# Patient Record
Sex: Male | Born: 1970 | Race: White | Hispanic: No | Marital: Single | Smoking: Current every day smoker
Health system: Southern US, Community
[De-identification: ages and names within clinical notes are randomized; demographics above are authoritative.]

## PROBLEM LIST (undated history)

## (undated) ENCOUNTER — Emergency Department: Admission: EM | Disposition: A | Discharge status: Home / Self Care | Payer: Medicaid Other | Source: Home / Self Care

## (undated) DIAGNOSIS — F419 Anxiety disorder, unspecified: Secondary | ICD-10-CM

## (undated) DIAGNOSIS — K922 Gastrointestinal hemorrhage, unspecified: Secondary | ICD-10-CM

## (undated) DIAGNOSIS — M1990 Unspecified osteoarthritis, unspecified site: Secondary | ICD-10-CM

## (undated) DIAGNOSIS — Z87891 Personal history of nicotine dependence: Secondary | ICD-10-CM

## (undated) DIAGNOSIS — I2089 Other forms of angina pectoris: Secondary | ICD-10-CM

## (undated) DIAGNOSIS — F329 Major depressive disorder, single episode, unspecified: Secondary | ICD-10-CM

## (undated) DIAGNOSIS — J449 Chronic obstructive pulmonary disease, unspecified: Secondary | ICD-10-CM

## (undated) DIAGNOSIS — K219 Gastro-esophageal reflux disease without esophagitis: Secondary | ICD-10-CM

## (undated) DIAGNOSIS — F32A Depression, unspecified: Secondary | ICD-10-CM

## (undated) DIAGNOSIS — I422 Other hypertrophic cardiomyopathy: Secondary | ICD-10-CM

## (undated) DIAGNOSIS — R011 Cardiac murmur, unspecified: Secondary | ICD-10-CM

## (undated) DIAGNOSIS — I10 Essential (primary) hypertension: Secondary | ICD-10-CM

## (undated) DIAGNOSIS — I208 Other forms of angina pectoris: Secondary | ICD-10-CM

## (undated) DIAGNOSIS — F319 Bipolar disorder, unspecified: Secondary | ICD-10-CM

## (undated) DIAGNOSIS — G629 Polyneuropathy, unspecified: Secondary | ICD-10-CM

## (undated) DIAGNOSIS — F909 Attention-deficit hyperactivity disorder, unspecified type: Secondary | ICD-10-CM

## (undated) DIAGNOSIS — F4310 Post-traumatic stress disorder, unspecified: Secondary | ICD-10-CM

## (undated) DIAGNOSIS — J45909 Unspecified asthma, uncomplicated: Secondary | ICD-10-CM

## (undated) HISTORY — DX: Attention-deficit hyperactivity disorder, unspecified type: F90.9

## (undated) HISTORY — DX: Polyneuropathy, unspecified: G62.9

## (undated) HISTORY — DX: Essential (primary) hypertension: I10

## (undated) HISTORY — DX: Gastro-esophageal reflux disease without esophagitis: K21.9

## (undated) HISTORY — DX: Gastrointestinal hemorrhage, unspecified: K92.2

## (undated) HISTORY — PX: CORONARY ARTERY BYPASS GRAFT: SHX141

## (undated) HISTORY — DX: Cardiac murmur, unspecified: R01.1

## (undated) HISTORY — DX: Other forms of angina pectoris: I20.89

## (undated) HISTORY — DX: Other forms of angina pectoris: I20.8

## (undated) HISTORY — DX: Major depressive disorder, single episode, unspecified: F32.9

## (undated) HISTORY — DX: Post-traumatic stress disorder, unspecified: F43.10

## (undated) HISTORY — DX: Anxiety disorder, unspecified: F41.9

## (undated) HISTORY — DX: Personal history of nicotine dependence: Z87.891

## (undated) HISTORY — DX: Chronic obstructive pulmonary disease, unspecified: J44.9

## (undated) HISTORY — DX: Unspecified asthma, uncomplicated: J45.909

## (undated) HISTORY — DX: Bipolar disorder, unspecified: F31.9

## (undated) HISTORY — PX: CARDIAC CATHETERIZATION: SHX172

## (undated) HISTORY — DX: Depression, unspecified: F32.A

## (undated) HISTORY — DX: Other hypertrophic cardiomyopathy: I42.2

## (undated) HISTORY — DX: Unspecified osteoarthritis, unspecified site: M19.90

## (undated) HISTORY — PX: COLON SURGERY: SHX602

---

## 2005-08-31 ENCOUNTER — Encounter: Admission: RE | Admit: 2005-08-31 | Discharge: 2005-08-31 | Payer: Self-pay | Admitting: Neurosurgery

## 2008-01-06 ENCOUNTER — Emergency Department (HOSPITAL_COMMUNITY): Admission: EM | Admit: 2008-01-06 | Discharge: 2008-01-06 | Payer: Self-pay | Admitting: Emergency Medicine

## 2008-01-23 ENCOUNTER — Emergency Department (HOSPITAL_BASED_OUTPATIENT_CLINIC_OR_DEPARTMENT_OTHER): Admission: EM | Admit: 2008-01-23 | Discharge: 2008-01-23 | Payer: Self-pay | Admitting: Emergency Medicine

## 2008-02-03 ENCOUNTER — Emergency Department (HOSPITAL_BASED_OUTPATIENT_CLINIC_OR_DEPARTMENT_OTHER): Admission: EM | Admit: 2008-02-03 | Discharge: 2008-02-03 | Payer: Self-pay | Admitting: Emergency Medicine

## 2008-02-09 ENCOUNTER — Emergency Department (HOSPITAL_BASED_OUTPATIENT_CLINIC_OR_DEPARTMENT_OTHER): Admission: EM | Admit: 2008-02-09 | Discharge: 2008-02-09 | Payer: Self-pay | Admitting: Emergency Medicine

## 2008-05-07 ENCOUNTER — Emergency Department (HOSPITAL_BASED_OUTPATIENT_CLINIC_OR_DEPARTMENT_OTHER): Admission: EM | Admit: 2008-05-07 | Discharge: 2008-05-07 | Payer: Self-pay | Admitting: Emergency Medicine

## 2008-06-30 ENCOUNTER — Emergency Department (HOSPITAL_COMMUNITY): Admission: EM | Admit: 2008-06-30 | Discharge: 2008-06-30 | Payer: Self-pay | Admitting: Emergency Medicine

## 2009-10-04 ENCOUNTER — Emergency Department (HOSPITAL_COMMUNITY): Admission: EM | Admit: 2009-10-04 | Discharge: 2009-10-05 | Payer: Self-pay | Admitting: Emergency Medicine

## 2009-10-11 ENCOUNTER — Emergency Department (HOSPITAL_BASED_OUTPATIENT_CLINIC_OR_DEPARTMENT_OTHER): Admission: EM | Admit: 2009-10-11 | Discharge: 2009-10-11 | Payer: Self-pay | Admitting: Emergency Medicine

## 2010-09-02 LAB — DIFFERENTIAL
Basophils Relative: 1 % (ref 0–1)
Eosinophils Absolute: 0.1 10*3/uL (ref 0.0–0.7)
Eosinophils Relative: 1 % (ref 0–5)
Lymphocytes Relative: 26 % (ref 12–46)
Lymphs Abs: 2.6 10*3/uL (ref 0.7–4.0)
Monocytes Absolute: 0.5 10*3/uL (ref 0.1–1.0)
Monocytes Relative: 5 % (ref 3–12)

## 2010-09-02 LAB — CBC
HCT: 46.3 % (ref 39.0–52.0)
Hemoglobin: 15.9 g/dL (ref 13.0–17.0)
MCV: 83.5 fL (ref 78.0–100.0)
RBC: 5.55 MIL/uL (ref 4.22–5.81)

## 2010-09-02 LAB — BASIC METABOLIC PANEL
BUN: 18 mg/dL (ref 6–23)
CO2: 24 mEq/L (ref 19–32)
Calcium: 8.8 mg/dL (ref 8.4–10.5)
Creatinine, Ser: 1 mg/dL (ref 0.4–1.5)
GFR calc Af Amer: >60 mL/min (ref >60)
Potassium: 4.8 mEq/L (ref 3.5–5.1)

## 2010-09-29 LAB — POCT CARDIAC MARKERS
CKMB, poc: <1 ng/mL — ABNORMAL LOW (ref 1.0–8.0)
Troponin i, poc: <0.05 ng/mL (ref 0.00–0.09)

## 2010-09-29 LAB — DIFFERENTIAL
Eosinophils Absolute: 0.3 10*3/uL (ref 0.0–0.7)
Eosinophils Relative: 4 % (ref 0–5)
Lymphs Abs: 2.6 10*3/uL (ref 0.7–4.0)
Monocytes Absolute: 0.5 10*3/uL (ref 0.1–1.0)
Monocytes Relative: 7 % (ref 3–12)
Neutro Abs: 4.2 10*3/uL (ref 1.7–7.7)
Neutrophils Relative %: 54 % (ref 43–77)

## 2010-09-29 LAB — POCT I-STAT, CHEM 8
Calcium, Ion: 1.11 mmol/L — ABNORMAL LOW (ref 1.12–1.32)
Glucose, Bld: 99 mg/dL (ref 70–99)
HCT: 52 % (ref 39.0–52.0)
TCO2: 20 mmol/L (ref 0–100)

## 2010-09-29 LAB — BASIC METABOLIC PANEL
BUN: 7 mg/dL (ref 6–23)
Calcium: 9.8 mg/dL (ref 8.4–10.5)
Creatinine, Ser: 1.12 mg/dL (ref 0.4–1.5)
Glucose, Bld: 104 mg/dL — ABNORMAL HIGH (ref 70–99)

## 2010-09-29 LAB — CBC
MCHC: 34.3 g/dL (ref 30.0–36.0)
Platelets: 223 10*3/uL (ref 150–400)

## 2010-10-28 ENCOUNTER — Observation Stay: Payer: Self-pay | Admitting: Internal Medicine

## 2010-11-13 ENCOUNTER — Observation Stay: Payer: Self-pay | Admitting: Internal Medicine

## 2011-01-09 ENCOUNTER — Emergency Department: Payer: Self-pay | Admitting: Emergency Medicine

## 2011-01-10 ENCOUNTER — Emergency Department: Payer: Self-pay | Admitting: *Deleted

## 2011-01-25 ENCOUNTER — Inpatient Hospital Stay: Payer: Self-pay | Admitting: Internal Medicine

## 2011-01-29 ENCOUNTER — Ambulatory Visit (INDEPENDENT_AMBULATORY_CARE_PROVIDER_SITE_OTHER): Payer: Medicaid Other | Admitting: Cardiovascular Disease

## 2011-01-29 ENCOUNTER — Encounter: Payer: Self-pay | Admitting: Cardiovascular Disease

## 2011-01-29 DIAGNOSIS — I421 Obstructive hypertrophic cardiomyopathy: Secondary | ICD-10-CM | POA: Insufficient documentation

## 2011-01-29 DIAGNOSIS — R609 Edema, unspecified: Secondary | ICD-10-CM | POA: Insufficient documentation

## 2011-01-29 DIAGNOSIS — G4733 Obstructive sleep apnea (adult) (pediatric): Secondary | ICD-10-CM

## 2011-01-29 NOTE — Assessment & Plan Note (Signed)
History of myomectomy in 2008. Echo suggesting moderate LVH in May of this year. I will review the echo myself. He is concerned about previous physicians suggesting he had an ICD. He refused any pacemaker or ICD in the past. He has been feeling well. Occasional dizziness typically with bending over and standing up. We have stressed he try to restart his metoprolol b.i.d.. This was previously stopped while in the hospital for bradycardia while sleeping.

## 2011-01-29 NOTE — Assessment & Plan Note (Signed)
He does not wear CPAP. He has been unable to tolerate this. Otherwise sleeps well with no significant symptoms of fatigue.

## 2011-01-29 NOTE — Assessment & Plan Note (Signed)
Edema is likely from venous insufficiency. Etiology is uncertain. We will refer him to the pain specialist for further management. We have recommended leg elevation, TED hose, Ace wraps. He reports that diuretics did not help.

## 2011-01-29 NOTE — Patient Instructions (Signed)
  No medication changes were made. I will look at your echo from May 2012 to look at the wall thickness and discuss with [facility].  Continue leg elevation, medical compression hose, or ACE wrap. Please call us if you have new issues that need to be addressed before your next appt.  We will call you for a follow up Appt. In 12 months

## 2011-01-29 NOTE — Progress Notes (Signed)
Patient ID: Jake Elliott, male    DOB: 07-18-1970, 40 y.o.   MRN: [id number]  HPI Comments: Jake Elliott is a very pleasant 40 year old gentleman who has a history of HOCM, myomectomy in 2008 who reports having lower externally swelling since that time that waxes and wanes with recent admission to the hospital for severe edema of the bilateral lower extremities, no DVT on ultrasound, previous echocardiogram in May showing normal LV systolic function, moderate LVH, normal right ventricular systolic pressures, BNP 336, cardiac catheterization 11/2010  showed no significant coronary artery disease, history of obstructive sleep apnea well unable to tolerate CPAP, who presents for evaluation of his edema.  In the hospital, he was started on antibiotics for possible cellulitis. He was advised to wear TED hose. Lasix did not seem to improve his edema. Notes indicate chronic venous insufficiency.  He denies any significant shortness of breath with exertion. No chest pain. His edema currently is slowly improving. He does not wear TED hose on a regular basis.  EKG shows normal sinus rhythm with rate 72 beats per minute with LVH, left atrial enlargement   Outpatient Encounter Prescriptions as of 01/29/2011  Medication Sig Dispense Refill  . aspirin 81 MG tablet Take 81 mg by mouth daily.        . furosemide (LASIX) 40 MG tablet Take 40 mg by mouth daily. As needed.       [name] HYDROmorphone (DILAUDID) 4 MG tablet Take 4 mg by mouth every 4 (four) hours as needed.        [name] ibuprofen (ADVIL,MOTRIN) 800 MG tablet Take 800 mg by mouth 1 day or 1 dose.        [name] LORazepam (ATIVAN) 0.5 MG tablet Take 0.5 mg by mouth every 8 (eight) hours.        . Melatonin 10 MG TABS Take by mouth.        .  metoprolol tartrate (LOPRESSOR) 25 MG tablet Take 25 mg by mouth 2 (two) times daily.    Not Taking      Review of Systems  Constitutional: Negative.   HENT: Negative.   Eyes: Negative.   Respiratory: Negative.     Cardiovascular: Positive for leg swelling.  Gastrointestinal: Negative.   Musculoskeletal: Negative.   Skin: Negative.   Neurological: Negative.   Hematological: Negative.   Psychiatric/Behavioral: Negative.   All other systems reviewed and are negative.    BP 131/86  Pulse 72  Ht 6' (1.829 m)  Wt 228 lb (103.42 kg)  BMI 30.92 kg/m2  Physical Exam  Nursing note and vitals reviewed. Constitutional: He is oriented to person, place, and time. He appears well-developed and well-nourished.  HENT:  Head: Normocephalic.  Nose: Nose normal.  Mouth/Throat: Oropharynx is clear and moist.  Eyes: Conjunctivae are normal. Pupils are equal, round, and reactive to light.  Neck: Normal range of motion. Neck supple. No JVD present.  Cardiovascular: Normal rate, regular rhythm, S1 normal, S2 normal and intact distal pulses.  Exam reveals no gallop and no friction rub.   Murmur heard.  Crescendo systolic murmur is present with a grade of 2/6       Trace pitting edema with signs of chronic erythema noted to the mid shin, shiny, tight bilaterally. Signs of skin discoloration in his feet and lower shin. Good capillary refill.  Pulmonary/Chest: Effort normal and breath sounds normal. No respiratory distress. He has no wheezes. He has no rales. He exhibits no tenderness.  Abdominal: Soft. Bowel sounds  are normal. He exhibits no distension. There is no tenderness.  Musculoskeletal: Normal range of motion. He exhibits no edema and no tenderness.  Lymphadenopathy:    He has no cervical adenopathy.  Neurological: He is alert and oriented to person, place, and time. Coordination normal.  Skin: Skin is warm and dry. No rash noted. No erythema.  Psychiatric: He has a normal mood and affect. His behavior is normal. Judgment and thought content normal.           Assessment and Plan

## 2011-01-30 ENCOUNTER — Encounter: Payer: Self-pay | Admitting: Cardiovascular Disease

## 2011-02-04 ENCOUNTER — Encounter: Payer: Self-pay | Admitting: Cardiovascular Disease

## 2011-02-10 ENCOUNTER — Telehealth: Payer: Self-pay | Admitting: *Deleted

## 2011-02-10 NOTE — Telephone Encounter (Signed)
Pt called to see if he could go back on his ADD medication. Dr. Mariah Milling advised pt hold due to enlarged heart, per pt. Pt knows Dr. Mariah Milling out of the office this week, this is not urgent, he would like his opinion on this matter. Will call pt back next week. Please advise. Thanks.

## 2011-02-10 NOTE — Telephone Encounter (Signed)
We could try it. Would stay hydrated, monitor heart rate when restarting medication.

## 2011-02-11 NOTE — Telephone Encounter (Signed)
Pt notified of msg below. He will call back with any changes.

## 2011-03-08 ENCOUNTER — Emergency Department: Payer: Self-pay | Admitting: Emergency Medicine

## 2011-05-27 ENCOUNTER — Emergency Department: Payer: Self-pay | Admitting: Emergency Medicine

## 2011-05-28 ENCOUNTER — Emergency Department: Payer: Self-pay | Admitting: Emergency Medicine

## 2011-05-28 ENCOUNTER — Telehealth: Payer: Self-pay | Admitting: Cardiovascular Disease

## 2011-05-28 NOTE — Telephone Encounter (Signed)
Attempted to contact pt, LMOM TCB.  

## 2011-05-28 NOTE — Telephone Encounter (Signed)
Spoke to pt, he c/o feeling fatigue, "feel heart skipping," "slight chest pain w/ symptoms." Denies SOB at this time. Pt seen in ER yesterday, EKG showed sinus bradycardia HR 56, pt was sent home. He is asking if he needs to be admitted. I told him if symptomatic, go to ER and if needs admission will be done from there.  Pt does have h/o HOCM, myomectomy in 2008; echo 10/2010 shows normal LV systolic fxn, moderate LVH; cardiac cath 6/12 shows no CAD. Pt does not have cuff at home to check BP/HR, but he does "feel heart skipping now." I suggested we do EKG in office, also offered him to have holter placed to verify rhythm and schedule f/u w/ Dr. Mariah Milling at first available. He says he will let me know if he wants to do this, but not at this time. I advised he call 911/go to ER if unstable or symptoms persist or worsen. He said EMS was called 1 week ago for same symptoms at EKG read at top RBBB. I told pt he could bring this by office for Dr. Mariah Milling to look at. Otherwise, pt will follow instructions above.

## 2011-05-28 NOTE — Telephone Encounter (Signed)
Would start metoprolol tartrate 25 mg po BID, #60, refill 6, take PRN  for APC and PVCs as seen on tele strip. Not atrial fib.

## 2011-05-28 NOTE — Telephone Encounter (Signed)
Pt was seen in the ED last night and was sent home. Was told he needed to be seen today by [facility]. Pt states he was having irregular HR beat. Pt states he can feel the heart block when it happen. Slight dizziness some clamminess. Pt states he is ok with spending a few days in the hospital if he needs to

## 2011-05-29 NOTE — Telephone Encounter (Signed)
Spoke to pt's father this AM, says pt is already taking metoprolol 25mg , per notes we had stopped in past. He says he took pt to ER this AM b/c pt wants to be admitted, BP was 96/50s and they said did not have capacity and offered to send to [facility] and pt refused. Pt has been offered to wear holter monitor or come in for EKG and he does not want to do at this time. Pt's father would like to talk to Dr. Mariah Milling.

## 2011-06-01 NOTE — Telephone Encounter (Signed)
i have talked to [name]. We have offered holter, encouraged he stay hydrated. Keep [place] posted on symptoms

## 2011-06-15 ENCOUNTER — Emergency Department: Payer: Self-pay | Admitting: Emergency Medicine

## 2011-06-16 ENCOUNTER — Inpatient Hospital Stay: Payer: Self-pay | Admitting: Psychiatry

## 2011-06-16 LAB — COMPREHENSIVE METABOLIC PANEL
Albumin: 4 g/dL (ref 3.4–5.0)
Anion Gap: 8 (ref 7–16)
Bilirubin,Total: 0.6 mg/dL (ref 0.2–1.0)
Calcium, Total: 9.3 mg/dL (ref 8.5–10.1)
Chloride: 112 mmol/L — ABNORMAL HIGH (ref 98–107)
Co2: 29 mmol/L (ref 21–32)
Creatinine: 1.13 mg/dL (ref 0.60–1.30)
EGFR (African American): >60
Glucose: 87 mg/dL (ref 65–99)
Osmolality: 294 (ref 275–301)
Potassium: 3.7 mmol/L (ref 3.5–5.1)
SGOT(AST): 18 U/L (ref 15–37)
Sodium: 149 mmol/L — ABNORMAL HIGH (ref 136–145)

## 2011-06-16 LAB — DRUG SCREEN, URINE
Barbiturates, Ur Screen: NEGATIVE (ref ?–200)
Cannabinoid 50 Ng, Ur ~~LOC~~: NEGATIVE (ref ?–50)
MDMA (Ecstasy)Ur Screen: NEGATIVE (ref ?–500)
Methadone, Ur Screen: NEGATIVE (ref ?–300)
Phencyclidine (PCP) Ur S: NEGATIVE (ref ?–25)

## 2011-06-16 LAB — URINALYSIS, COMPLETE
Bilirubin,UR: NEGATIVE
Blood: NEGATIVE
Glucose,UR: NEGATIVE mg/dL (ref 0–75)
Leukocyte Esterase: NEGATIVE
Protein: NEGATIVE
Squamous Epithelial: <1

## 2011-06-16 LAB — CBC
MCHC: 34.1 g/dL (ref 32.0–36.0)
MCV: 86 fL (ref 80–100)
Platelet: 236 10*3/uL (ref 150–440)
RBC: 5.42 10*6/uL (ref 4.40–5.90)
RDW: 14.2 % (ref 11.5–14.5)
WBC: 10.5 10*3/uL (ref 3.8–10.6)

## 2011-06-16 LAB — ETHANOL: Ethanol %: <0.003 % (ref 0.000–0.080)

## 2011-06-17 LAB — BEHAVIORAL MEDICINE 1 PANEL
Albumin: 3.8 g/dL (ref 3.4–5.0)
Basophil #: 0 10*3/uL (ref 0.0–0.1)
Basophil %: 0.4 %
Bilirubin,Total: 0.9 mg/dL (ref 0.2–1.0)
Chloride: 106 mmol/L (ref 98–107)
Co2: 27 mmol/L (ref 21–32)
Creatinine: 1.41 mg/dL — ABNORMAL HIGH (ref 0.60–1.30)
EGFR (African American): >60
EGFR (Non-African Amer.): 60 — ABNORMAL LOW
Eosinophil #: 0.2 10*3/uL (ref 0.0–0.7)
Glucose: 89 mg/dL (ref 65–99)
HGB: 14.1 g/dL (ref 13.0–18.0)
Lymphocyte #: 3.3 10*3/uL (ref 1.0–3.6)
Lymphocyte %: 33.2 %
MCH: 28.5 pg (ref 26.0–34.0)
MCHC: 33.5 g/dL (ref 32.0–36.0)
MCV: 85 fL (ref 80–100)
Monocyte #: 0.9 10*3/uL — ABNORMAL HIGH (ref 0.0–0.7)
Neutrophil #: 5.5 10*3/uL (ref 1.4–6.5)
Neutrophil %: 55 %
Osmolality: 282 (ref 275–301)
Platelet: 221 10*3/uL (ref 150–440)
RBC: 4.93 10*6/uL (ref 4.40–5.90)
RDW: 14.3 % (ref 11.5–14.5)
SGPT (ALT): 22 U/L
Sodium: 142 mmol/L (ref 136–145)
Total Protein: 7.3 g/dL (ref 6.4–8.2)

## 2011-06-21 LAB — CARBAMAZEPINE LEVEL, TOTAL: Carbamazepine: 16.4 ug/mL (ref 4.0–12.0)

## 2011-06-22 LAB — HEPATIC FUNCTION PANEL A (ARMC)
Albumin: 3.4 g/dL (ref 3.4–5.0)
Alkaline Phosphatase: 60 U/L (ref 50–136)
Bilirubin, Direct: <0.1 mg/dL (ref 0.00–0.20)
Bilirubin,Total: 0.5 mg/dL (ref 0.2–1.0)
SGOT(AST): 15 U/L (ref 15–37)
Total Protein: 6.7 g/dL (ref 6.4–8.2)

## 2011-06-22 LAB — CBC WITH DIFFERENTIAL/PLATELET
Basophil %: 0.6 %
Eosinophil #: 0.2 10*3/uL (ref 0.0–0.7)
Eosinophil %: 2.8 %
HCT: 38.4 % — ABNORMAL LOW (ref 40.0–52.0)
HGB: 13 g/dL (ref 13.0–18.0)
Lymphocyte #: 1.3 10*3/uL (ref 1.0–3.6)
Lymphocyte %: 20.1 %
MCH: 29 pg (ref 26.0–34.0)
MCV: 86 fL (ref 80–100)
Monocyte #: 0.9 10*3/uL — ABNORMAL HIGH (ref 0.0–0.7)
Neutrophil #: 4 10*3/uL (ref 1.4–6.5)
Neutrophil %: 62.4 %
Platelet: 176 10*3/uL (ref 150–440)
RBC: 4.48 10*6/uL (ref 4.40–5.90)
WBC: 6.4 10*3/uL (ref 3.8–10.6)

## 2011-06-22 LAB — CARBAMAZEPINE LEVEL, TOTAL: Carbamazepine: 10.7 ug/mL (ref 4.0–12.0)

## 2011-06-29 ENCOUNTER — Telehealth: Payer: Self-pay | Admitting: *Deleted

## 2011-06-29 NOTE — Telephone Encounter (Signed)
Attempted to contact pt, LMOM TCB. Per Dr. Mariah Milling will see if pt wants to schedule a f/u post hospital.

## 2011-07-01 NOTE — Telephone Encounter (Signed)
Pt returned call, he states he does not need f/u with Dr. Mariah Milling at this time. He will call if he needs to schedule.

## 2011-07-05 ENCOUNTER — Emergency Department: Payer: Self-pay | Admitting: Emergency Medicine

## 2011-07-05 LAB — DRUG SCREEN, URINE
Amphetamines, Ur Screen: NEGATIVE (ref ?–1000)
Barbiturates, Ur Screen: NEGATIVE (ref ?–200)
Benzodiazepine, Ur Scrn: POSITIVE (ref ?–200)
Cannabinoid 50 Ng, Ur ~~LOC~~: NEGATIVE (ref ?–50)
Methadone, Ur Screen: NEGATIVE (ref ?–300)
Opiate, Ur Screen: POSITIVE (ref ?–300)
Phencyclidine (PCP) Ur S: NEGATIVE (ref ?–25)

## 2011-07-05 LAB — COMPREHENSIVE METABOLIC PANEL
Anion Gap: 10 (ref 7–16)
Bilirubin,Total: 0.5 mg/dL (ref 0.2–1.0)
Calcium, Total: 8.9 mg/dL (ref 8.5–10.1)
Chloride: 102 mmol/L (ref 98–107)
Co2: 27 mmol/L (ref 21–32)
EGFR (African American): >60
EGFR (Non-African Amer.): >60
Glucose: 100 mg/dL — ABNORMAL HIGH (ref 65–99)
Osmolality: 278 (ref 275–301)
Potassium: 3.2 mmol/L — ABNORMAL LOW (ref 3.5–5.1)
SGOT(AST): 19 U/L (ref 15–37)
SGPT (ALT): 22 U/L
Sodium: 139 mmol/L (ref 136–145)

## 2011-07-05 LAB — TSH: Thyroid Stimulating Horm: 3.59 u[IU]/mL

## 2011-07-05 LAB — CBC
HCT: 42 % (ref 40.0–52.0)
HGB: 14.6 g/dL (ref 13.0–18.0)
MCH: 29.5 pg (ref 26.0–34.0)
MCHC: 34.7 g/dL (ref 32.0–36.0)
Platelet: 229 10*3/uL (ref 150–440)
RBC: 4.95 10*6/uL (ref 4.40–5.90)
RDW: 14.1 % (ref 11.5–14.5)

## 2011-07-05 LAB — URINALYSIS, COMPLETE
Blood: NEGATIVE
Hyaline Cast: 1
Leukocyte Esterase: NEGATIVE
Nitrite: NEGATIVE
Ph: 5 (ref 4.5–8.0)
Protein: 30
Specific Gravity: 1.033 (ref 1.003–1.030)

## 2011-07-05 LAB — ETHANOL
Ethanol %: <0.003 % (ref 0.000–0.080)
Ethanol: <3 mg/dL

## 2011-07-05 LAB — SALICYLATE LEVEL: Salicylates, Serum: 3.3 mg/dL — ABNORMAL HIGH

## 2011-07-12 ENCOUNTER — Emergency Department: Payer: Self-pay | Admitting: Unknown Physician Specialty

## 2011-07-18 ENCOUNTER — Emergency Department: Payer: Self-pay | Admitting: Emergency Medicine

## 2011-07-20 ENCOUNTER — Ambulatory Visit: Payer: Self-pay | Admitting: Pain Medicine

## 2011-09-15 ENCOUNTER — Encounter: Payer: Self-pay | Admitting: Cardiovascular Disease

## 2011-09-15 ENCOUNTER — Ambulatory Visit (INDEPENDENT_AMBULATORY_CARE_PROVIDER_SITE_OTHER): Payer: Medicaid Other | Admitting: Cardiovascular Disease

## 2011-09-15 VITALS — BP 136/85 | HR 81 | Resp 18 | Ht 72.0 in | Wt 224.0 lb

## 2011-09-15 DIAGNOSIS — R000 Tachycardia, unspecified: Secondary | ICD-10-CM | POA: Insufficient documentation

## 2011-09-15 DIAGNOSIS — I421 Obstructive hypertrophic cardiomyopathy: Secondary | ICD-10-CM

## 2011-09-15 DIAGNOSIS — G4733 Obstructive sleep apnea (adult) (pediatric): Secondary | ICD-10-CM

## 2011-09-15 DIAGNOSIS — R609 Edema, unspecified: Secondary | ICD-10-CM

## 2011-09-15 MED ORDER — AMIODARONE HCL 200 MG PO TABS: 200.0000 mg | ORAL_TABLET | Freq: Two times a day (BID) | ORAL | Status: DC

## 2011-09-15 NOTE — Assessment & Plan Note (Signed)
Holter monitor for 48 hours has been ordered. I am concerned about nonsustained VT as well as SVT. He reports being very symptomatic at nighttime though over the past week, symptoms seem to have improved. 4 nonsustained VT and SVT, he may do better with low-dose amiodarone. He has been on verapamil in the past and this was held secondary to bradycardia and heart block. He is very cautious about being on additional medication such as diltiazem when necessary given history of second degree heart block.

## 2011-09-15 NOTE — Patient Instructions (Signed)
You are doing well. We will place a holter this weekend for tachycardia  Next week, you can start amiodarone 200 mg once a day For severe tachycardia, increase to two a day for a few day then back to once a day.   Please call us if you have new issues that need to be addressed before your next appt.  Your physician wants you to follow-up in: 1 months.  You will receive a reminder letter in the mail two months in advance. If you don't receive a letter, please call our office to schedule the follow-up appointment.

## 2011-09-15 NOTE — Assessment & Plan Note (Signed)
No significant edema on today's visit. We have encouraged leg elevation and TED hose as needed

## 2011-09-15 NOTE — Assessment & Plan Note (Signed)
He does have some symptoms of lightheadedness when he lays down, no significant shortness of breath with exertion. We have encouraged him to stay hydrated. We will continue to work on good rate control as he will be more symptomatic with tachycardia.  In general he has been doing well on metoprolol 25 twice a day. Holter monitor has been ordered for his arrhythmia that seems to come on predominantly at night.

## 2011-09-15 NOTE — Assessment & Plan Note (Addendum)
He does have a history of obstructive sleep apnea. He is not able to tolerate CPAP. This could be contributing to his tachycardia at nighttime if he does have periods of hypoxia. Symptoms are rare.

## 2011-09-15 NOTE — Progress Notes (Signed)
Patient ID: Jake Elliott, male    DOB: 1970/09/02, 41 y.o.   MRN: [id number]  HPI Comments: Jake Elliott is a very pleasant 41 year old gentleman who has a history of HOCM, myomectomy in 2008 who reports having lower extremity swelling since that time that waxes and wanes with admission to the hospital for severe edema of the bilateral lower extremities (felt to be lymphedema), no DVT on ultrasound, previous echocardiogram in May showing normal LV systolic function, moderate LVH, normal right ventricular systolic pressures, BNP 336, cardiac catheterization 11/2010  showed no significant coronary artery disease, history of obstructive sleep apnea  unable to tolerate CPAP, who presents for evaluation. He reports having a history of nonsustained VT.  He has had a challenging several months with history of anxiety depression. He has had a long hospital course requiring assistance from psychiatry. He states that he used to feel much better on his ADD medications. He has tried several of them in the past and reports when he is on these, his heart arrhythmia seemed to improve. He has had recent severe lower extremity edema managed with leg elevation, compression, occasional diuretic. He denies any significant shortness of breath.  His biggest problem is tachycardia which has been present while he sleeps 3-4 times per week lasting for less than 5 minutes at a time described as a rapid pounding rhythm. He does have history of obstructive sleep apnea, reports possibly having panic attacks. He does report his rhythm is very fast and very strong. He is concerned given the previous EKGs showing nonsustained VT per his report. The EKGs are not available for our review. He does report having periods of slow rhythm, as well as ectopy.    Outpatient Encounter Prescriptions as of 09/15/2011  Medication Sig Dispense Refill  . furosemide (LASIX) 40 MG tablet Take 40 mg by mouth daily. As needed.       [name] HYDROmorphone  (DILAUDID) 4 MG tablet Take 4 mg by mouth every 4 (four) hours as needed.        . Melatonin 10 MG TABS Take by mouth.        . metoprolol tartrate (LOPRESSOR) 25 MG tablet Take 25 mg by mouth 2 (two) times daily.      [name] testosterone (ANDROGEL) 50 MG/5GM GEL Place 5 g onto the skin daily.      [name] amiodarone (PACERONE) 200 MG tablet Take 1 tablet (200 mg total) by mouth 2 (two) times daily.  60 tablet  3  . aspirin 81 MG tablet Take 81 mg by mouth daily.        [name] ibuprofen (ADVIL,MOTRIN) 800 MG tablet Take 800 mg by mouth 1 day or 1 dose.        [name] LORazepam (ATIVAN) 0.5 MG tablet Take 0.5 mg by mouth every 8 (eight) hours.          Review of Systems  Constitutional: Negative.   HENT: Negative.   Eyes: Negative.   Respiratory: Negative.   Cardiovascular: Positive for palpitations.       Tachycardia  Gastrointestinal: Negative.   Musculoskeletal: Negative.   Skin: Negative.   Neurological: Negative.   Hematological: Negative.   Psychiatric/Behavioral: Negative.   All other systems reviewed and are negative.    BP 136/85  Pulse 81  Resp 18  Ht 6' (1.829 m)  Wt 224 lb (101.606 kg)  BMI 30.38 kg/m2  Physical Exam  Nursing note and vitals reviewed. Constitutional: He is oriented to person, place, and  time. He appears well-developed and well-nourished.  HENT:  Head: Normocephalic.  Nose: Nose normal.  Mouth/Throat: Oropharynx is clear and moist.  Eyes: Conjunctivae are normal. Pupils are equal, round, and reactive to light.  Neck: Normal range of motion. Neck supple. No JVD present.  Cardiovascular: Normal rate, regular rhythm, S1 normal, S2 normal, normal heart sounds and intact distal pulses.  Exam reveals no gallop and no friction rub.   No murmur heard. Pulmonary/Chest: Effort normal and breath sounds normal. No respiratory distress. He has no wheezes. He has no rales. He exhibits no tenderness.  Abdominal: Soft. Bowel sounds are normal. He exhibits no distension. There is no  tenderness.  Musculoskeletal: Normal range of motion. He exhibits no edema and no tenderness.  Lymphadenopathy:    He has no cervical adenopathy.  Neurological: He is alert and oriented to person, place, and time. Coordination normal.  Skin: Skin is warm and dry. No rash noted. No erythema.  Psychiatric: He has a normal mood and affect. His behavior is normal. Judgment and thought content normal.           Assessment and Plan

## 2011-09-27 ENCOUNTER — Emergency Department: Payer: Self-pay | Admitting: Internal Medicine

## 2011-09-27 LAB — BASIC METABOLIC PANEL
BUN: 10 mg/dL (ref 7–18)
Calcium, Total: 9.3 mg/dL (ref 8.5–10.1)
Co2: 25 mmol/L (ref 21–32)
Creatinine: 1.04 mg/dL (ref 0.60–1.30)
EGFR (Non-African Amer.): >60
Glucose: 123 mg/dL — ABNORMAL HIGH (ref 65–99)
Potassium: 3.9 mmol/L (ref 3.5–5.1)
Sodium: 143 mmol/L (ref 136–145)

## 2011-09-27 LAB — CBC
HCT: 46.3 % (ref 40.0–52.0)
MCHC: 34.2 g/dL (ref 32.0–36.0)
MCV: 87 fL (ref 80–100)
Platelet: 225 10*3/uL (ref 150–440)
RDW: 13.8 % (ref 11.5–14.5)

## 2011-09-27 LAB — TROPONIN I: Troponin-I: <0.02 ng/mL

## 2011-10-15 ENCOUNTER — Telehealth: Payer: Self-pay | Admitting: Cardiovascular Disease

## 2011-10-15 NOTE — Telephone Encounter (Signed)
Pt had an appt on 5/2 for month f/u but doesn't feel he needs to come to appt. Wants to know if someone can just give him a call for monitor results.

## 2011-10-15 NOTE — Telephone Encounter (Signed)
Patient called stated he cancelled appointment with Dr.Gollan 10/16/11.Stated monitor results not available yet.

## 2011-10-16 ENCOUNTER — Ambulatory Visit: Payer: Medicaid Other | Admitting: Cardiovascular Disease

## 2011-10-19 ENCOUNTER — Other Ambulatory Visit: Payer: Self-pay | Admitting: Cardiovascular Disease

## 2011-10-19 DIAGNOSIS — R000 Tachycardia, unspecified: Secondary | ICD-10-CM

## 2011-11-16 IMAGING — CR DG RIBS 2V*R*
1 series · 3 of 3 positions shown · non-contrast
Comparison: none

REASON FOR EXAM: fall injury
COMMENTS:

PROCEDURE:     DXR - DXR RIBS RIGHT UNILATERAL  - March 08, 2011  [DATE]
RESULT:     No fracture or other acute bony abnormality is identified. Note
is made that a metallic BB marker was placed at the site of maximum
symptomatology. No pneumothorax or pleural effusion is seen.

[Series 1: view not recorded · 0.17mm/px · 3 of 3 slices shown]
[im 1/3]
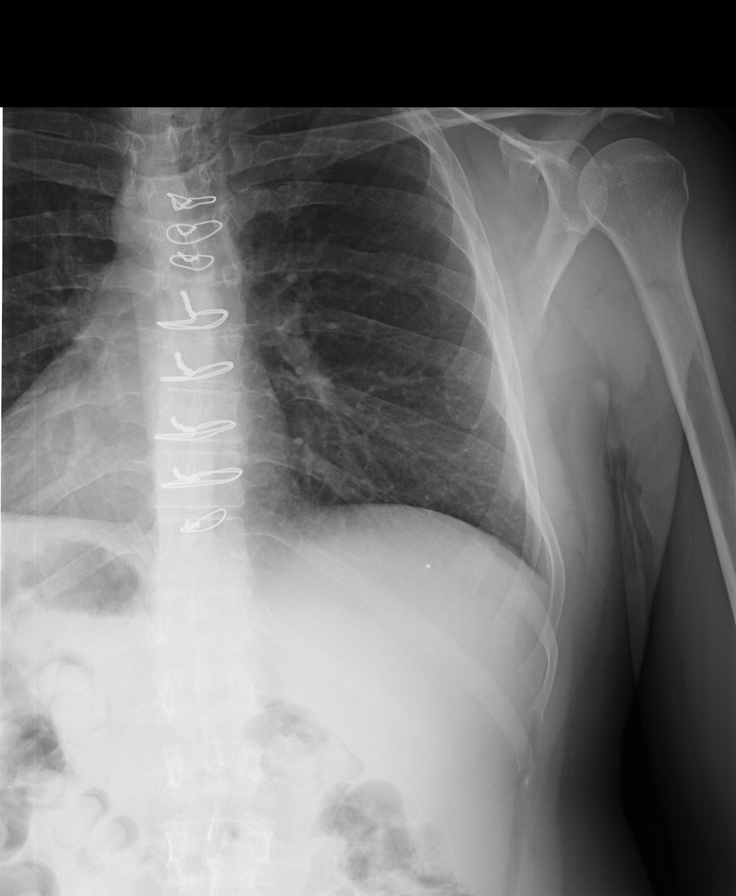
[im 2/3]
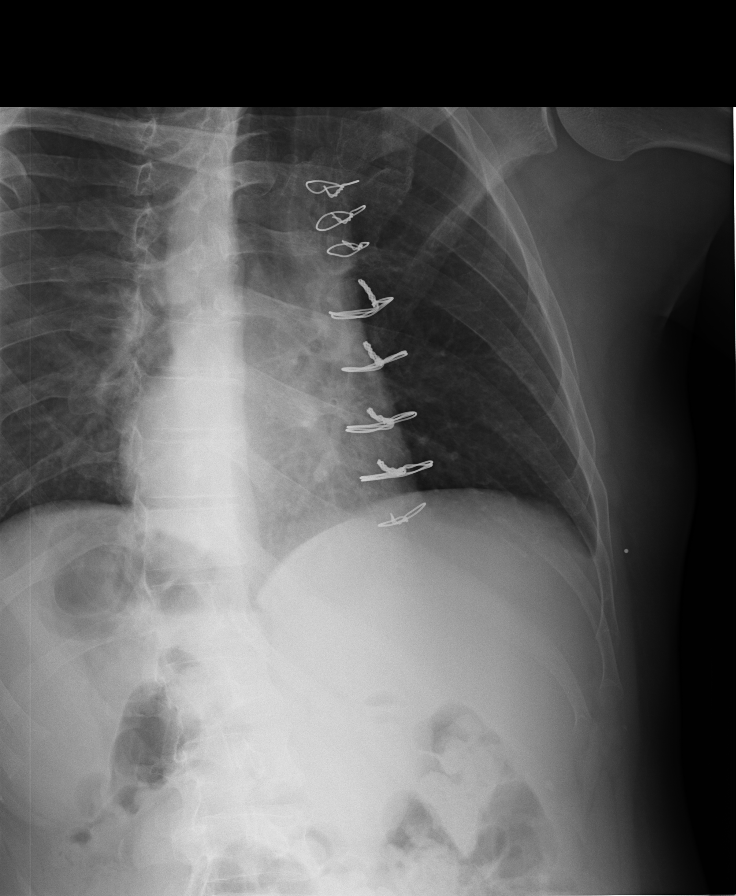
[im 3/3]
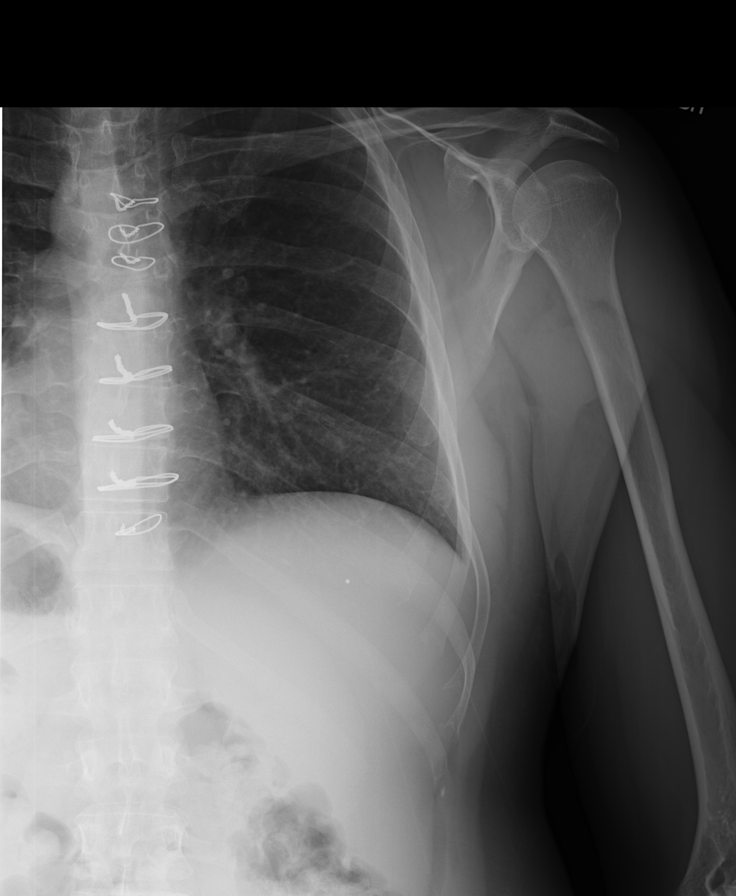

[3 of 3 positions shown; findings below may reference images not displayed]

IMPRESSION: No significant bony abnormalities are identified.

## 2011-12-30 ENCOUNTER — Emergency Department: Payer: Self-pay | Admitting: Emergency Medicine

## 2011-12-30 LAB — BASIC METABOLIC PANEL
BUN: 13 mg/dL (ref 7–18)
Chloride: 109 mmol/L — ABNORMAL HIGH (ref 98–107)
Creatinine: 1.11 mg/dL (ref 0.60–1.30)
Potassium: 4.4 mmol/L (ref 3.5–5.1)

## 2011-12-30 LAB — CK TOTAL AND CKMB (NOT AT ARMC)
CK, Total: 63 U/L (ref 35–232)
CK-MB: 1.3 ng/mL (ref 0.5–3.6)

## 2011-12-30 LAB — CBC
HCT: 47.2 % (ref 40.0–52.0)
HGB: 15.4 g/dL (ref 13.0–18.0)
MCH: 28.8 pg (ref 26.0–34.0)
MCHC: 32.6 g/dL (ref 32.0–36.0)
RDW: 13.1 % (ref 11.5–14.5)

## 2012-01-26 ENCOUNTER — Emergency Department: Payer: Self-pay | Admitting: Emergency Medicine

## 2012-02-06 ENCOUNTER — Emergency Department: Payer: Self-pay | Admitting: Emergency Medicine

## 2012-03-11 ENCOUNTER — Emergency Department: Payer: Self-pay | Admitting: Emergency Medicine

## 2012-04-26 ENCOUNTER — Telehealth: Payer: Self-pay | Admitting: Cardiovascular Disease

## 2012-04-26 NOTE — Telephone Encounter (Signed)
Pt states he had a heart monitor put on a while back and would like to know the results of the monitor.  Please call back to advise.

## 2012-04-27 NOTE — Telephone Encounter (Signed)
LMTCB

## 2012-04-29 NOTE — Telephone Encounter (Signed)
Pt informed of results from May 2013 holter I asked if he needed an appt for this He declines says he will call when/if he is ready

## 2012-08-23 ENCOUNTER — Telehealth: Payer: Self-pay

## 2012-08-23 NOTE — Telephone Encounter (Signed)
Is this ok?

## 2012-08-23 NOTE — Telephone Encounter (Signed)
Patient needs to have a tooth extraction with a note stating he is cleared to have and needs to have done in the hospital.

## 2012-08-23 NOTE — Telephone Encounter (Signed)
Certainly he can have the surgery and would be acceptable risk. As to whether it needs to be done in the hospital would be up to the dentist Typically they can give a local anesthetic, minimal if any sedation and do this in the office Would defer the decision to the dentist

## 2012-08-24 NOTE — Telephone Encounter (Signed)
Pt informed I will fax clearance to [facility] in [place]

## 2012-09-22 ENCOUNTER — Ambulatory Visit: Payer: Medicaid Other | Admitting: Cardiovascular Disease

## 2012-11-24 ENCOUNTER — Ambulatory Visit (INDEPENDENT_AMBULATORY_CARE_PROVIDER_SITE_OTHER): Payer: Medicaid Other | Admitting: Cardiovascular Disease

## 2012-11-24 ENCOUNTER — Encounter: Payer: Self-pay | Admitting: Cardiovascular Disease

## 2012-11-24 VITALS — BP 148/98 | HR 89 | Ht 73.0 in | Wt 214.0 lb

## 2012-11-24 DIAGNOSIS — I421 Obstructive hypertrophic cardiomyopathy: Secondary | ICD-10-CM

## 2012-11-24 DIAGNOSIS — Z9889 Other specified postprocedural states: Secondary | ICD-10-CM

## 2012-11-24 DIAGNOSIS — M546 Pain in thoracic spine: Secondary | ICD-10-CM | POA: Insufficient documentation

## 2012-11-24 DIAGNOSIS — I10 Essential (primary) hypertension: Secondary | ICD-10-CM

## 2012-11-24 DIAGNOSIS — R609 Edema, unspecified: Secondary | ICD-10-CM

## 2012-11-24 DIAGNOSIS — R079 Chest pain, unspecified: Secondary | ICD-10-CM

## 2012-11-24 DIAGNOSIS — R000 Tachycardia, unspecified: Secondary | ICD-10-CM

## 2012-11-24 MED ORDER — VERAPAMIL HCL 40 MG PO TABS: 40.0000 mg | ORAL_TABLET | Freq: Three times a day (TID) | ORAL | Status: DC | PRN

## 2012-11-24 MED ORDER — NITROGLYCERIN 0.4 MG SL SUBL: 0.4000 mg | SUBLINGUAL_TABLET | SUBLINGUAL | Status: DC | PRN

## 2012-11-24 MED ORDER — VERAPAMIL HCL ER 120 MG PO TBCR: 120.0000 mg | EXTENDED_RELEASE_TABLET | Freq: Every day | ORAL | Status: DC

## 2012-11-24 NOTE — Assessment & Plan Note (Signed)
Pressure has been running high. We'll start him on verapamil 120 mg daily. He can use extra 40 mg tablets as needed for hypertension or tachycardia.

## 2012-11-24 NOTE — Assessment & Plan Note (Signed)
No significant edema on today's visit 

## 2012-11-24 NOTE — Assessment & Plan Note (Signed)
Sounds as if he continues to have ectopy. Unable to exclude tachycardia. Verapamil will likely help underlying arrhythmia. If symptoms persist, he could wear a monitor, perhaps a longer 30 day monitor.

## 2012-11-24 NOTE — Assessment & Plan Note (Signed)
Unable to exclude spasm as a cause of his chest pain. We have given him nitroglycerin to take as needed. He states he did this before and this worked well for him

## 2012-11-24 NOTE — Progress Notes (Signed)
Patient ID: Jake Elliott, male    DOB: 31-Dec-1970, 42 y.o.   MRN: [id number]  HPI Comments: [name] is a very pleasant 42 year old gentleman who has a history of HOCM, myomectomy in 2008 who reports having lower extremity swelling since that time that waxes and wanes with admission to the hospital for severe edema of the bilateral lower extremities (felt to be lymphedema), no DVT on ultrasound, previous echocardiogram in May showing normal LV systolic function, moderate LVH, normal right ventricular systolic pressures, BNP 336, cardiac catheterization 11/2010  showed no significant coronary artery disease, history of obstructive sleep apnea  unable to tolerate CPAP, who presents for routine followup.  He reports having a history of nonsustained VT.   history of anxiety depression. He has had a long hospital course requiring assistance from psychiatry.  He has had chronic pain syndrome. Recently he has been trying to wean himself off his pain medication.  He has been noticing increasing frequency of palpitations, runs of tachycardia. Blood pressure has been running high. He no longer takes his metoprolol as it was making him feel fatigued. He denies any significant shortness of breath. Sometimes has significant left chest, left arm pain.  In the past, he was taking metoprolol and verapamil at the same time for his symptoms of tachycardia and outflow tract obstruction. He states that this caused a second-degree heart block and he was continued on metoprolol. He does not like the way metoprolol makes him feel. He would be willing to try verapamil for blood pressure.  Prior Holter monitor in 2013 showing normal sinus rhythm with APCs and PVCs   Outpatient Encounter Prescriptions as of 11/24/2012  Medication Sig Dispense Refill  . buprenorphine-naloxone (SUBOXONE) 8-2 MG SUBL Place 1 tablet under the tongue daily.       Review of Systems  Constitutional: Negative.   HENT: Negative.   Eyes:  Negative.   Respiratory: Positive for chest tightness.   Cardiovascular: Positive for palpitations.       Tachycardia  Gastrointestinal: Negative.   Musculoskeletal: Negative.   Skin: Negative.   Neurological: Negative.   Psychiatric/Behavioral: Negative.   All other systems reviewed and are negative.    BP 148/98  Pulse 89  Ht 6\' 1"  (1.854 m)  Wt 214 lb (97.07 kg)  BMI 28.24 kg/m2  Physical Exam  Nursing note and vitals reviewed. Constitutional: He is oriented to person, place, and time. He appears well-developed and well-nourished.  HENT:  Head: Normocephalic.  Nose: Nose normal.  Mouth/Throat: Oropharynx is clear and moist.  Eyes: Conjunctivae are normal. Pupils are equal, round, and reactive to light.  Neck: Normal range of motion. Neck supple. No JVD present.  Cardiovascular: Normal rate, regular rhythm, S1 normal, S2 normal, normal heart sounds and intact distal pulses.  Exam reveals no gallop and no friction rub.   No murmur heard. Pulmonary/Chest: Effort normal and breath sounds normal. No respiratory distress. He has no wheezes. He has no rales. He exhibits no tenderness.  Abdominal: Soft. Bowel sounds are normal. He exhibits no distension. There is no tenderness.  Musculoskeletal: Normal range of motion. He exhibits no edema and no tenderness.  Lymphadenopathy:    He has no cervical adenopathy.  Neurological: He is alert and oriented to person, place, and time. Coordination normal.  Skin: Skin is warm and dry. No rash noted. No erythema.  Psychiatric: He has a normal mood and affect. His behavior is normal. Judgment and thought content normal.  Assessment and Plan

## 2012-11-24 NOTE — Assessment & Plan Note (Signed)
Continued murmur likely from residual outflow tract gradient. Given tachycardia, chest pain symptoms, echocardiogram has been ordered.

## 2012-11-24 NOTE — Patient Instructions (Addendum)
Please start verapamil 120 mg once a day Take the 40 mg pill as needed for breakthrough tachycardia and chest discomfort  Also take NTG for chest tightness  Please call us if you have new issues that need to be addressed before your next appt.  Your physician wants you to follow-up in: 6 months.  You will receive a reminder letter in the mail two months in advance. If you don't receive a letter, please call our office to schedule the follow-up appointment.

## 2012-11-29 ENCOUNTER — Other Ambulatory Visit: Payer: Medicaid Other

## 2012-12-06 ENCOUNTER — Other Ambulatory Visit (INDEPENDENT_AMBULATORY_CARE_PROVIDER_SITE_OTHER): Payer: Medicaid Other

## 2012-12-06 DIAGNOSIS — R079 Chest pain, unspecified: Secondary | ICD-10-CM

## 2012-12-09 ENCOUNTER — Telehealth: Payer: Self-pay

## 2012-12-09 NOTE — Telephone Encounter (Signed)
Pt wanting echo results

## 2012-12-09 NOTE — Telephone Encounter (Signed)
I informed pt echo results not read yet but we will call him when this is done Understanding verb

## 2012-12-09 NOTE — Telephone Encounter (Signed)
Pt would like echo results.pt states he would like a call back within 45 minutes

## 2012-12-12 NOTE — Telephone Encounter (Signed)
Can we have result associated with the order. Not scanned in It was read last week

## 2012-12-13 NOTE — Telephone Encounter (Signed)
Pt's father, Alden Server, informed of results Will put copy of echo at [facility] for him to pick up

## 2012-12-17 IMAGING — CR DG CHEST 1V PORT
1 series · 1 of 1 positions shown · non-contrast
Comparison: none

REASON FOR EXAM: CP
COMMENTS:

PROCEDURE:     DXR - DXR PORTABLE CHEST SINGLE VIEW  - January 09, 2011  [DATE]
RESULT:     Comparison: None

[view not recorded]
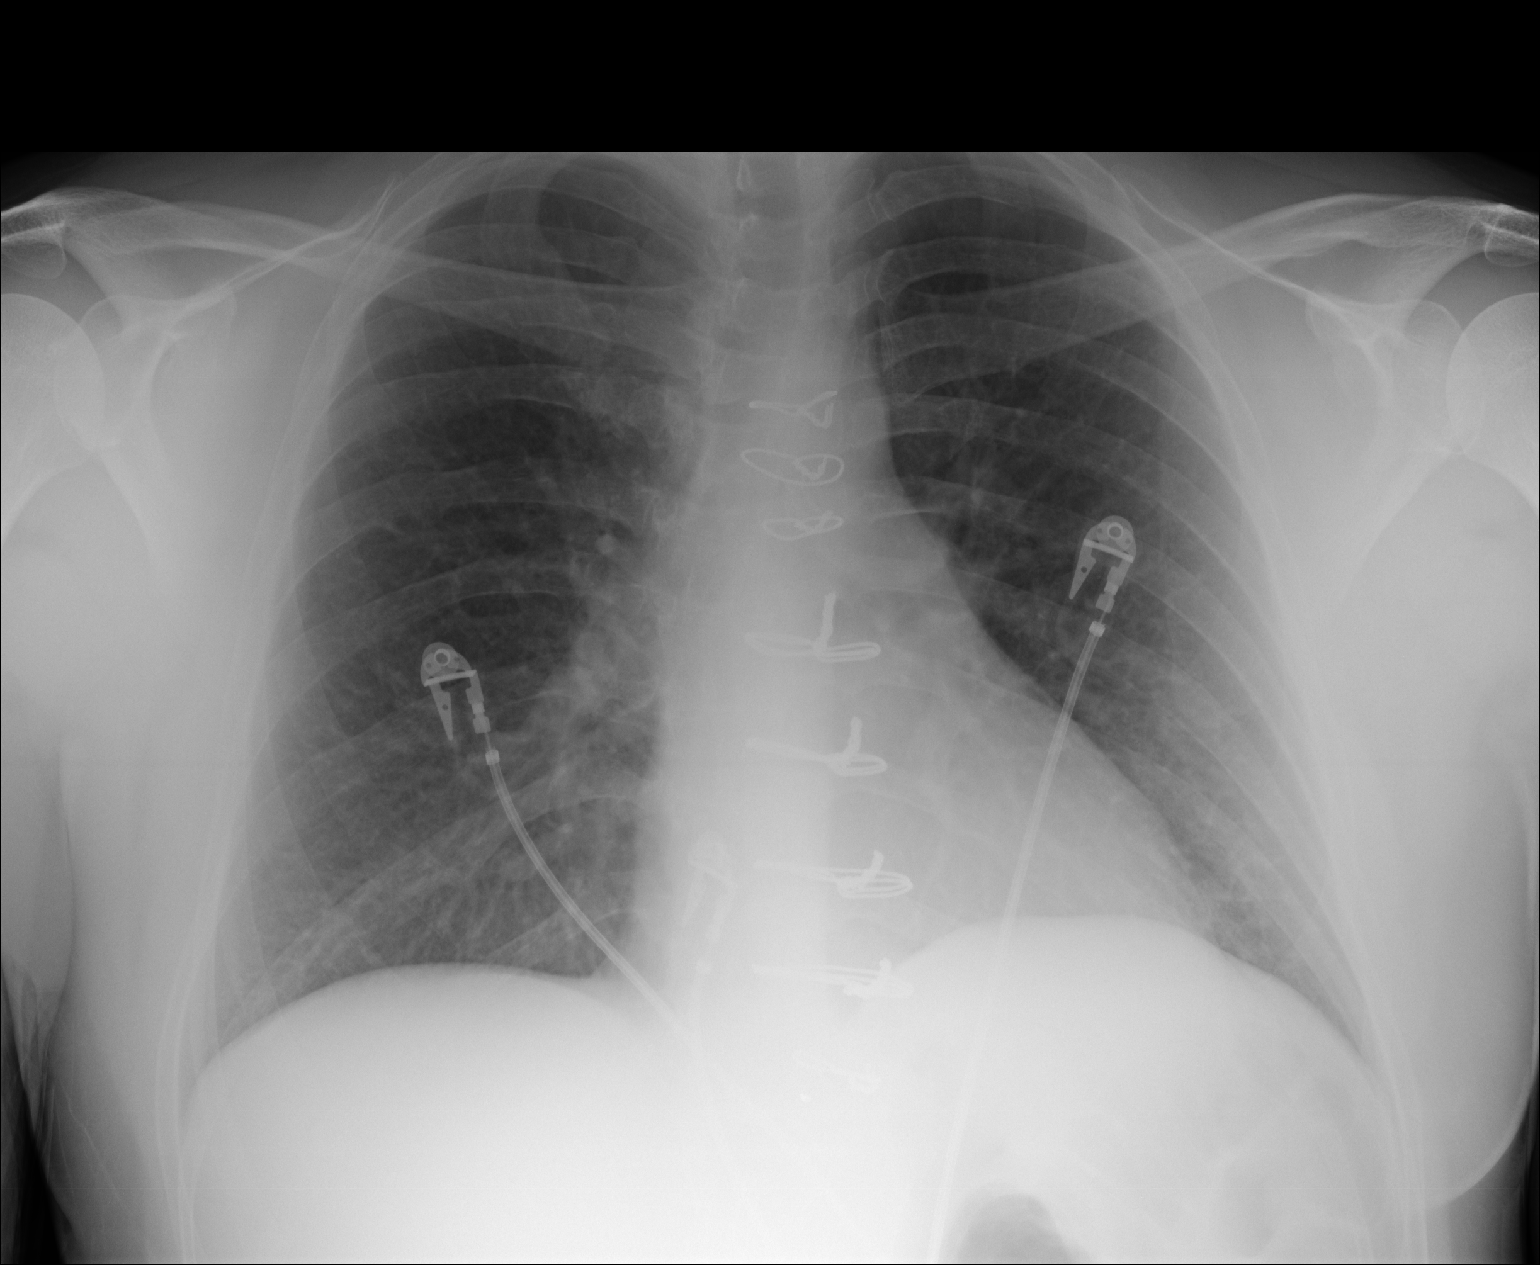

[1 of 1 positions shown; findings below may reference images not displayed]

FINDINGS: Single portable AP chest radiograph is provided.  There is no focal
parenchymal opacity, pleural effusion, or pneumothorax. There is evidence of
prior CABG. Normal cardiomediastinal silhouette. The osseous structures are
unremarkable.
IMPRESSION: No acute disease of the chest.

## 2013-01-23 ENCOUNTER — Emergency Department: Payer: Self-pay | Admitting: Emergency Medicine

## 2013-01-23 LAB — URINALYSIS, COMPLETE
Bacteria: NONE SEEN
Bilirubin,UR: NEGATIVE
Blood: NEGATIVE
Ketone: NEGATIVE
Leukocyte Esterase: NEGATIVE
Nitrite: NEGATIVE
Ph: 5 (ref 4.5–8.0)
Protein: NEGATIVE
RBC,UR: <1 /HPF (ref 0–5)
Squamous Epithelial: NONE SEEN
WBC UR: 1 /HPF (ref 0–5)

## 2013-01-23 LAB — CBC
HCT: 45.8 % (ref 40.0–52.0)
HGB: 16.2 g/dL (ref 13.0–18.0)
MCH: 29.1 pg (ref 26.0–34.0)
MCV: 82 fL (ref 80–100)
Platelet: 202 10*3/uL (ref 150–440)
RDW: 13 % (ref 11.5–14.5)

## 2013-01-23 LAB — COMPREHENSIVE METABOLIC PANEL
Albumin: 4.1 g/dL (ref 3.4–5.0)
BUN: 8 mg/dL (ref 7–18)
Calcium, Total: 9.3 mg/dL (ref 8.5–10.1)
Chloride: 111 mmol/L — ABNORMAL HIGH (ref 98–107)
Co2: 23 mmol/L (ref 21–32)
EGFR (African American): >60
EGFR (Non-African Amer.): >60
SGPT (ALT): 17 U/L (ref 12–78)
Sodium: 140 mmol/L (ref 136–145)

## 2013-01-23 LAB — TSH: Thyroid Stimulating Horm: 1.8 u[IU]/mL

## 2013-01-23 LAB — DRUG SCREEN, URINE
Amphetamines, Ur Screen: NEGATIVE (ref ?–1000)
Benzodiazepine, Ur Scrn: NEGATIVE (ref ?–200)
Cannabinoid 50 Ng, Ur ~~LOC~~: NEGATIVE (ref ?–50)
Cocaine Metabolite,Ur ~~LOC~~: NEGATIVE (ref ?–300)
Methadone, Ur Screen: NEGATIVE (ref ?–300)
Phencyclidine (PCP) Ur S: NEGATIVE (ref ?–25)
Tricyclic, Ur Screen: NEGATIVE (ref ?–1000)

## 2013-01-25 DIAGNOSIS — M545 Low back pain, unspecified: Secondary | ICD-10-CM | POA: Insufficient documentation

## 2013-01-25 DIAGNOSIS — G629 Polyneuropathy, unspecified: Secondary | ICD-10-CM | POA: Insufficient documentation

## 2013-01-25 DIAGNOSIS — F319 Bipolar disorder, unspecified: Secondary | ICD-10-CM | POA: Insufficient documentation

## 2013-01-25 DIAGNOSIS — I421 Obstructive hypertrophic cardiomyopathy: Secondary | ICD-10-CM | POA: Insufficient documentation

## 2013-01-25 DIAGNOSIS — Z79891 Long term (current) use of opiate analgesic: Secondary | ICD-10-CM | POA: Insufficient documentation

## 2013-01-25 DIAGNOSIS — I10 Essential (primary) hypertension: Secondary | ICD-10-CM | POA: Insufficient documentation

## 2013-01-25 DIAGNOSIS — G64 Other disorders of peripheral nervous system: Secondary | ICD-10-CM | POA: Insufficient documentation

## 2013-01-25 DIAGNOSIS — G8929 Other chronic pain: Secondary | ICD-10-CM | POA: Insufficient documentation

## 2013-02-01 ENCOUNTER — Ambulatory Visit: Payer: Self-pay | Admitting: Neurosurgery

## 2013-04-20 ENCOUNTER — Other Ambulatory Visit: Payer: Self-pay

## 2013-06-14 ENCOUNTER — Emergency Department: Payer: Self-pay | Admitting: Emergency Medicine

## 2013-06-14 LAB — COMPREHENSIVE METABOLIC PANEL
Alkaline Phosphatase: 91 U/L
BUN: 10 mg/dL (ref 7–18)
Bilirubin,Total: 1.2 mg/dL — ABNORMAL HIGH (ref 0.2–1.0)
Calcium, Total: 8.8 mg/dL (ref 8.5–10.1)
Co2: 24 mmol/L (ref 21–32)
Creatinine: 0.92 mg/dL (ref 0.60–1.30)
EGFR (African American): >60
EGFR (Non-African Amer.): >60
Glucose: 103 mg/dL — ABNORMAL HIGH (ref 65–99)
Osmolality: 268 (ref 275–301)
Potassium: 3.4 mmol/L — ABNORMAL LOW (ref 3.5–5.1)
SGPT (ALT): 14 U/L (ref 12–78)
Total Protein: 7.4 g/dL (ref 6.4–8.2)

## 2013-06-14 LAB — CBC WITH DIFFERENTIAL/PLATELET
Basophil #: 0.1 10*3/uL (ref 0.0–0.1)
Basophil %: 0.6 %
Eosinophil #: 0.3 10*3/uL (ref 0.0–0.7)
HGB: 15 g/dL (ref 13.0–18.0)
Lymphocyte #: 1.1 10*3/uL (ref 1.0–3.6)
Lymphocyte %: 8.5 %
MCH: 28.1 pg (ref 26.0–34.0)
MCHC: 34.1 g/dL (ref 32.0–36.0)
Monocyte %: 7.8 %
Neutrophil #: 10.6 10*3/uL — ABNORMAL HIGH (ref 1.4–6.5)
Neutrophil %: 80.6 %
Platelet: 182 10*3/uL (ref 150–440)
RBC: 5.32 10*6/uL (ref 4.40–5.90)
RDW: 13.1 % (ref 11.5–14.5)

## 2014-02-07 ENCOUNTER — Ambulatory Visit: Payer: Self-pay

## 2014-02-08 ENCOUNTER — Emergency Department: Payer: Self-pay | Admitting: Emergency Medicine
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
## 2014-10-07 NOTE — Discharge Summary (Signed)
PATIENT NAME:  Jake Elliott, Jake Elliott MR#:  [id number] DATE OF BIRTH:  Oct 23, 1970  DATE OF ADMISSION:  06/16/2011 DATE OF DISCHARGE:  06/23/2011  HISTORY OF PRESENT ILLNESS:  [name] was admitted to the inpatient Behavioral Medicine Unit on 06/16/2011 with the chief complaint of "I feel like I am going to snap. I have racing thoughts with no outlet." He was having increased energy and racing thoughts. He was also stating that he felt like he was losing his threshold for aggressiveness. He had no thoughts of harming anyone specifically. He felt extremely irritable.   Please see th rest of the history of present illness from the admission dictation.   ANCILLARY CLINICAL DATA: [name]'s Tegretol level did rise to 16.4 on a standard dose of 200 mg t.i.d. His dosage was adjusted down to 100 mg q.a.m. and 200 mg at bedtime. His follow-up blood level on the 7th of January was just above 10. Please see the discharge instructions.   His follow-up CBC and liver function panel were unremarkable after being on the Tegretol.   [name] explained his positive benzodiazepine on his urine drug screen as stemming from a Xanax prescription that his primary care physician had given him as a very brief to help him calm down.   [name] did experience some bradycardia including a decrease down into the upper 40s while sitting.   The undersigned called [name]'s cardiologist, Jake Elliott, M.D., regarding the patient's cardiac condition and his metoprolol dosage. Dr. Mariah Elliott reaffirmed that the drop in the patient's pulse down into the upper 40s was acceptable given that the patient was not having any symptoms and that level of pulse control was necessary given the patient's heart condition. Dr. Mariah Elliott also offered the patient a follow-up appointment within the first week of discharge.  HOSPITAL COURSE: [name] was admitted to the inpatient Behavioral Medicine Unit and underwent milieu and group  psychotherapy. He was started on Tegretol. Please see the above discussion. He did require Restoril 30 mg at bedtime for anti-insomnia.   As his hospital course progressed he became calmer and reported that he could tolerate his social environment well. He was engaging in social groups and the overall milieu well with no signs of irritability.   His hope and interest returned to normal. He was not having any racing thoughts.   CONDITION ON DISCHARGE: By 06/23/2011 [name] has a normal mood. He has no racing thoughts. His interest and hope are within normal limits. He is not having any anger or elevated. He has no hallucinations or delusions.   MENTAL STATUS EXAM UPON DISCHARGE: [name] is alert. His concentration is normal. He is oriented to all spheres. His memory is intact to immediate, recent, and remote. Thought process is logical, coherent, goal directed. No looseness of associations or tangents. Thought content: No thoughts of harming himself or others. No delusions or hallucinations. His affect is broad and appropriate. Mood is within normal limits. Insight is intact. Judgment is intact.   DISCHARGE DIAGNOSES:  AXIS I: Bipolar I disorder, recent episode mixed, now stable.   AXIS II: Deferred.   AXIS III: History of spinal arthritis, history of atrial enlargement with a history of myomectomy.   AXIS IV: Marital, general medical, occupational.   AXIS V: 55.   DISPOSITION: [name] is not at risk to harm himself or others. He agrees to call emergency services immediately for any thoughts of harming himself, thoughts of harming others, or distress.  [name] agrees to not drive if drowsy.   DIET: Regular.   ACTIVITY: Routine.  FOLLOW-UP APPOINTMENTS: Please see the ancillary clinical data regarding Dr. Windell Elliott appointment. Dr. Mariah Milling stated to the undersigned that Dr. Windell Elliott staff will call the patient at home with an appointment during the first week of his  discharge.   [name] will have his psychiatric follow-up with [facility] on the 8th of January 2013.   DISCHARGE MEDICATIONS:  1. Hydromorphone 4 mg one q.i.d. p.r.n., dispense #12.  2. Metoprolol 25 mg one p.o. b.i.d., dispense #14. 3. Pregabalin 75 mg one p.o. b.i.d., dispense #14.  4. Tegretol 100 mg 1 p.o. every a.m., dispense #7.  5. Tegretol 200 mg 1 p.o. at bedtime, dispense #7.  6. Temazepam 30 mg 1 p.o. at bedtime, dispense #7.    ____________________________ Adelene Amas. Roshan Salamon, MD [id number] D: 06/23/2011 20:20:10 ET T: 06/25/2011 11:18:40 ET JOB#: [id number]  cc: Adelene Amas. Zohra Clavel, MD, <Dictator> Lester [place] MD ELECTRONICALLY SIGNED 07/09/2011 11:52

## 2014-10-07 NOTE — Consult Note (Signed)
Brief Consult Note: Diagnosis: Bipolar affective disorder, opioid dependence, benzodiazepine abuse.   Patient was seen by consultant.   Consult note dictated.   Recommend further assessment or treatment.   Discussed with Attending MD.   Comments: [name] has a h/o bipolar disorder. He was discharged from [facility] on 06/23/2011. He is compliant with medications and f/u with his psychiatrist in [place]. He is stable on medications. He is not suicidal or homicidal.   He has no access to pain medications that he believes he needs. He was reportedly referred to [facility] pain clinic by Dr. Welton FlakesKhan. I could not confirm his appointment there. He is no longer able to travel [facility] [place] due to transportation problems.   PLAN: 1. The patient does not meet criteria for inpatient psychiatric hospitalization. Please discharge as appropriate.   2. The patient declines opioid and benzodiazepine detox. He was given information about detox/treatment centers accepting medicaid in [place].  3. He follows up with [facility] in [place], where his Medicaid is.   4. He was given information about other pain clinics in the area [facility] pain [facility] in [place] [phone] and [facility] [phone], and [facility] in [place] [phone].  5. He is to continue Tegretol as prescribed by his psychiatrist.  6. He is in care of cardiologist and needs to continue Metoprolol 25 mg twice daily.  Electronic Signatures: Kristine LineaPucilowska, Maniah Nading (MD)  (Signed 21-Jan-13 12:20)  Authored: Brief Consult Note   Last Updated: 21-Jan-13 12:20 by Kristine LineaPucilowska, Alyshia Kernan (MD)

## 2014-10-07 NOTE — H&P (Signed)
PATIENT NAME:  Jake Elliott, Jake Elliott MR#:  [id number] DATE OF BIRTH:  [birth year]  DATE OF ADMISSION:  06/16/2011  CHIEF COMPLAINT: "I feel like I am going to snap. I have racing thoughts with no outlet."   HISTORY OF PRESENT ILLNESS: Jake Elliott describes a history of several weeks of depressed mood, poor energy, difficulty concentrating, alternating with periods where he has severe irritability and mild increased energy with racing thoughts and feeling like he cannot control his irritability. He did mention in his triage history to the nurse that he was having thoughts that he might hurt somebody. He did not mean it specifically towards a certain individual. He was expressing that his threshold for controlling his anger was low.   He does identify some financial stresses.   Jake Elliott does have intact orientation and memory function.   PAST PSYCHIATRIC HISTORY: Jake Elliott does describe periods of at least seven days that particularly occurred in his 14s. They would involve decreased need for sleep, euphoria, racing thoughts, and increased goal-directed activity. 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 He would have increased goal-directed in working on his art. These periods would occur without any stimulants. They rarely occur now. However, he does have periods of expansive mood with irritable mood. He will have racing thoughts that he cannot control. He rarely does have euphoric mood. He has difficulty sleeping.   He was tried on Depakote, which was associated with an exacerbation of depressive symptoms, particularly labile crying. He has not been tried on lithium.   He has received a diagnosis of bipolar disorder in the past along with attention deficit/hyperactivity disorder. He has been treated by the [facility] in [place], [place]. He also has been diagnosed with a form of anxiety, but the specifics  are unclear at this point.   He has no history of self injury and he has no history of violence towards others.   FAMILY PSYCHIATRIC HISTORY: None known.   SOCIAL HISTORY: Jake Elliott has been residing with his parents and is separated from his wife. 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 He has never been arrested or incarcerated. He is currently unemployed.   ALLERGIES: Codeine, morphine and nitroglycerin.   PAST MEDICAL HISTORY:    1. He has a history of bilateral lower extremity cellulitis. 2. Hypertrophic cardiomyopathy with a history of myectomy. 3. Obstructive sleep apnea with intolerance to CPAP.  4. Obesity. 5. Peripheral neuropathy.  6. History of chronic back pain, which has required Dilaudid.   LABORATORY DATA: SGOT 17, SGPT 22, BUN 12, creatinine 1.41. WBC 10.1, hemoglobin 14.1, platelet count 321.   REVIEW OF SYSTEMS: Constitutional, head, eyes, ears, nose, throat, mouth, neurologic, psychiatric, cardiovascular, respiratory, gastrointestinal, genitourinary, skin, musculoskeletal, hematologic, lymphatic, endocrine, metabolic all unremarkable.   PHYSICAL EXAMINATION:  VITAL SIGNS: Temperature 96.6, pulse 62, respiratory rate 20, blood pressure 122/58.   GENERAL APPEARANCE: Jake Elliott is a well-developed middle-aged male lying in a supine position in his hospital bed with no abnormal involuntary movement. He has no cachexia. He is well groomed and has normal hygiene.   HEENT: Oropharynx, no erythema. He has good dentition. Pupils equally round and reactive to light and accommodation. Hearing intact bilaterally to finger rub.   NECK: Supple. No masses.   RESPIRATORY: Normal breath sounds. No wheezing, rhonchi, or crackles.   CARDIOVASCULAR: No murmurs, rubs, or gallops.  There is regular rate and rhythm.   ABDOMEN: Bowel sounds positive, nontender. No masses.   GENITOURINARY: Deferred.   LYMPHATICS: No  palpable nodes.   EXTREMITIES: No cyanosis, clubbing, or edema.   SKIN: Normal turgor. No rashes.   NEUROLOGIC: Cranial nerves II through XII intact. Sensory intact throughout to light touch. Motor 5/5 strength throughout with some decreased effort in the lower extremities secondary to back pain. Deep tendon reflexes normal strength and symmetry throughout with no Babinski. Coordination intact by finger-to-nose bilaterally.   MENTAL STATUS EXAMINATION: Jake Elliott is alert. His concentration is mildly decreased. His fund of knowledge, use of language and intelligence are within normal limits. His speech involves mild pressure. No dysarthria. Thought process involves mild increased rate. Thought process is logical, coherent, and goal directed. No tangents. Thought content: He has thoughts of hopelessness and helplessness. He does feel like he would be uncontrolled with his feelings of irritability if outside the supportive confines of the hospital. He is worried that he might overreact. He has no delusions or hallucinations. He is cooperative with the examiner and responds well to ego supportive therapy. His affect is mildly irritable, but, again, does respond calming with psychotherapy. His mood is angry. His insight is partial. His judgment is intact for the need of psychiatric care.   ASSESSMENT:  AXIS I: Bipolar I disorder, recent episode depressed versus mixed.   AXIS II: Deferred.   AXIS III:  1. Spinal arthritis (provisional). 2. History of atrial enlargement confirmed by [facility], history of myectomy.   AXIS IV: Marital, general medical, occupational.   AXIS V: 30.   Jake Elliott would be at risk for possible suicide or possible violence towards others if outside the supportive environment of a psychiatric unit.   RECOMMENDATIONS:  Therefore, will admit Jake Elliott to the inpatient Behavioral Medicine unit for further evaluation and treatment.   Jake Elliott will  undergo milieu and group psychotherapy.   The indications, alternatives and adverse effects to the following medications were discussed with Jake Elliott: Tegretol, as his primary mood stabilizer, Restoril for anti-insomnia in the context of obstructive sleep apnea not responding or compatible with CPAP. Jake Elliott understands and wants to proceed as below.   We will start Tegretol at 200 mg 3 times daily. We will follow-up with a second screening hepatic panel and CBC for Tegretol screening within one week. Will utilize Restoril 15 mg at bedtime and only give a second 15 mg of Restoril if the patient is not asleep in two hours.   ____________________________ Adelene AmasJames S. Kyren Knick, MD jsw:ap D: 06/17/2011 21:56:20 ET T: 06/18/2011 08:15:22 ET JOB#: [id number]  cc: Adelene AmasJames S. Clevie Prout, MD, <Dictator> Lester CarolinaJAMES S Ilyas Lipsitz MD ELECTRONICALLY SIGNED 06/21/2011 21:56

## 2014-11-23 DIAGNOSIS — R4189 Other symptoms and signs involving cognitive functions and awareness: Secondary | ICD-10-CM | POA: Insufficient documentation

## 2014-11-23 DIAGNOSIS — Z9889 Other specified postprocedural states: Secondary | ICD-10-CM | POA: Insufficient documentation

## 2014-11-29 ENCOUNTER — Encounter: Payer: Self-pay | Admitting: Psychiatry

## 2014-11-29 ENCOUNTER — Ambulatory Visit (INDEPENDENT_AMBULATORY_CARE_PROVIDER_SITE_OTHER): Payer: Medicaid Other | Admitting: Psychiatry

## 2014-11-29 VITALS — BP 161/109 | HR 89

## 2014-11-29 DIAGNOSIS — G9009 Other idiopathic peripheral autonomic neuropathy: Secondary | ICD-10-CM | POA: Insufficient documentation

## 2014-11-29 DIAGNOSIS — F331 Major depressive disorder, recurrent, moderate: Secondary | ICD-10-CM | POA: Diagnosis not present

## 2014-11-29 MED ORDER — DEXTROAMPHETAMINE SULFATE ER 10 MG PO CP24: 10.0000 mg | ORAL_CAPSULE | Freq: Two times a day (BID) | ORAL | Status: DC

## 2014-11-29 NOTE — Progress Notes (Signed)
BH MD/PA/NP OP Progress Note  11/29/2014 11:49 AM Jake Elliott  MRN:  [id number]  Subjective:   Patient is a 44 year old male who presented for the follow-up appointment after he was last seen in Feb  2015. She reported that he moved back to live with his father in [place]. He reported that he has been having anxiety and ADD symptoms as he is unable to focus and concentrate. 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 Patient stated that he wants to get back to work in [facility] where he has made several sculptures in [employer]. Patient was showing me the pictures on his telephone. He reported that since his surgery due to hypertrophic cardiomyopathy he has and feels that there is a disconnect between his hands and his heart. Patient reported that he wants to start working again and has been getting calls from his previous employer and wants to restart working again and wants a trial of Dexedrine at this time. He currently denied having any suicidal ideations or plans. He denied using any other medications and stated that he has stopped using all the pain pills as well.    Chief Complaint:  Chief Complaint    Follow-up; Depression     Visit Diagnosis:     ICD-9-CM ICD-10-CM   1. MDD (major depressive disorder), recurrent episode, moderate 296.32 F33.1     Past Medical History:  Past Medical History  Diagnosis Date  . Attention-deficit/hyperactivity disorder   . Peripheral neuropathy   . Anxiety   . Hypertension   . Angina at rest   . History of tobacco abuse   . Hypertrophic cardiomyopathy     s/p myomectomy    Past Surgical History  Procedure Laterality Date  . Coronary artery bypass graft      for myomectomy for hypertrophic cardiomyopathy  . Cardiac catheterization      11/14/2010   Family History: No family history on file. Social  History:  History   Social History  . Marital Status: Married    Spouse Name: N/A  . Number of Children: N/A  . Years of Education: N/A   Social History Main Topics  . Smoking status: Current Every Day Smoker -- 0.25 packs/day for 15 years    Types: Cigarettes  . Smokeless tobacco: Not on file  . Alcohol Use: No  . Drug Use: No  . Sexual Activity: Not on file   Other Topics Concern  . Not on file   Social History Narrative  . No narrative on file   Additional History:  Moved back in with father. He stated that he cannot work with his hands and he used to sculpt and draw. He is in a daze.   Assessment:   Musculoskeletal: Strength & Muscle Tone: decreased Gait & Station: normal Patient leans: N/A  Psychiatric Specialty Exam: HPI  Review of Systems  Constitutional: Negative for chills.  HENT: Negative for hearing loss and nosebleeds.   Eyes: Negative for photophobia.  Cardiovascular: Negative for palpitations.  Gastrointestinal: Negative for vomiting.  Genitourinary: Negative for urgency.  Neurological: Positive for tremors.  Endo/Heme/Allergies: Negative for environmental allergies.  Psychiatric/Behavioral: Positive for depression. The patient is nervous/anxious.     There were no vitals taken for this visit.There is no weight on file to calculate BMI.  General Appearance: Very anxious  Eye  Contact:  Fair  Speech:  very anxious  Volume:  Normal  Mood:  Anxious  Affect:  anxious  Thought Process:  Circumstantial  Orientation:  Full (Time, Place, and Person)  Thought Content:  WDL  Suicidal Thoughts:  No  Homicidal Thoughts:  No  Memory:  Immediate;   Fair  Judgement:  Fair  Insight:  Fair  Psychomotor Activity:  Restlessness  Concentration:  Fair  Recall:  [employer] of Knowledge: Fair  Language: Fair  Akathisia:  No  Handed:  Right  AIMS (if indicated):  none  Assets:  Communication Skills Desire for Improvement Social Support Talents/Skills   ADL's:  Intact  Cognition: WNL  Sleep:  5-6   Is the patient at risk to self?  No. Has the patient been a risk to self in the past 6 months?  No. Has the patient been a risk to self within the distant past?  No. Is the patient a risk to others?  No. Has the patient been a risk to others in the past 6 months?  No. Has the patient been a risk to others within the distant past?  No.  Current Medications: Current Outpatient Prescriptions  Medication Sig Dispense Refill  . buprenorphine-naloxone (SUBOXONE) 8-2 MG SUBL Place 1 tablet under the tongue daily.    . nitroGLYCERIN (NITROSTAT) 0.4 MG SL tablet Place 1 tablet (0.4 mg total) under the tongue every 5 (five) minutes as needed for chest pain. 25 tablet 6  . verapamil (CALAN) 40 MG tablet Take 1 tablet (40 mg total) by mouth 3 (three) times daily as needed. 90 tablet 6  . verapamil (CALAN-SR) 120 MG CR tablet Take 1 tablet (120 mg total) by mouth at bedtime. 30 tablet 6   No current facility-administered medications for this visit.    Medical Decision Making:  New problem, with additional work up planned, Review of Psycho-Social Stressors (1), Decision to obtain old records (1) and Review and summation of old records (2)  Treatment Plan Summary:Medication management   Discussed with patient about his medications and I will start him on Dexedrine 10 mg by mouth twice a day Patient reported that his blood pressure is usually very high and he does not take any medication for the same. He was advised to follow up in the emergency room or with the primary care physician to take care of his high blood pressure and he demonstrated understanding.  He will be following up in a month to discuss about his depressive symptoms and anxiety in detail Advised about the adverse effects of the medication and he demonstrated understanding   More than 50% of the time spent in psychoeducation, counseling and coordination of care.    This note was  generated in part or whole with voice recognition software. Voice regonition is usually quite accurate but there are transcription errors that can and very often do occur. I apologize for any typographical errors that were not detected and corrected.    Brandy Hale 11/29/2014, 11:49 AM

## 2014-12-05 ENCOUNTER — Telehealth: Payer: Self-pay | Admitting: Psychiatry

## 2014-12-06 ENCOUNTER — Encounter: Payer: Self-pay | Admitting: Emergency Medicine

## 2014-12-06 ENCOUNTER — Encounter: Payer: Self-pay | Admitting: *Deleted

## 2014-12-06 ENCOUNTER — Emergency Department
Admission: EM | Admit: 2014-12-06 | Discharge: 2014-12-06 | Disposition: A | Discharge status: Home / Self Care | Payer: Self-pay | Attending: Emergency Medicine | Admitting: Emergency Medicine

## 2014-12-06 DIAGNOSIS — K047 Periapical abscess without sinus: Secondary | ICD-10-CM | POA: Insufficient documentation

## 2014-12-06 DIAGNOSIS — Z720 Tobacco use: Secondary | ICD-10-CM | POA: Insufficient documentation

## 2014-12-06 DIAGNOSIS — I10 Essential (primary) hypertension: Secondary | ICD-10-CM | POA: Insufficient documentation

## 2014-12-06 DIAGNOSIS — Z79899 Other long term (current) drug therapy: Secondary | ICD-10-CM | POA: Insufficient documentation

## 2014-12-06 MED ORDER — OXYCODONE-ACETAMINOPHEN 5-325 MG PO TABS
ORAL_TABLET | ORAL | Status: AC
  Administered 2014-12-06: 1 via ORAL
  Filled 2014-12-06: qty 1

## 2014-12-06 MED ORDER — LIDOCAINE VISCOUS 2 % MT SOLN
OROMUCOSAL | Status: AC
Start: 2014-12-06 — End: 2014-12-06
  Filled 2014-12-06: qty 15

## 2014-12-06 MED ORDER — IBUPROFEN 800 MG PO TABS
ORAL_TABLET | ORAL | Status: AC
  Administered 2014-12-06: 800 mg via ORAL
  Filled 2014-12-06: qty 1

## 2014-12-06 MED ORDER — OXYCODONE-ACETAMINOPHEN 7.5-325 MG PO TABS: 1.0000 | ORAL_TABLET | ORAL | Status: DC | PRN

## 2014-12-06 MED ORDER — ONDANSETRON HCL 4 MG/2ML IJ SOLN
INTRAMUSCULAR | Status: AC
  Filled 2014-12-06: qty 2

## 2014-12-06 MED ORDER — IBUPROFEN 800 MG PO TABS
800.0000 mg | ORAL_TABLET | Freq: Once | ORAL | Status: AC
  Administered 2014-12-06: 800 mg via ORAL

## 2014-12-06 MED ORDER — CLINDAMYCIN PHOSPHATE 600 MG/50ML IV SOLN
600.0000 mg | Freq: Once | INTRAVENOUS | Status: AC
  Administered 2014-12-06: 600 mg via INTRAVENOUS

## 2014-12-06 MED ORDER — HYDROMORPHONE HCL 1 MG/ML IJ SOLN
1.0000 mg | Freq: Once | INTRAMUSCULAR | Status: AC
  Administered 2014-12-06: 1 mg via INTRAVENOUS

## 2014-12-06 MED ORDER — CLINDAMYCIN PHOSPHATE 600 MG/50ML IV SOLN
INTRAVENOUS | Status: AC
  Filled 2014-12-06: qty 50

## 2014-12-06 MED ORDER — LIDOCAINE VISCOUS 2 % MT SOLN
15.0000 mL | Freq: Once | OROMUCOSAL | Status: AC
  Administered 2014-12-06: 15 mL via OROMUCOSAL

## 2014-12-06 MED ORDER — AMOXICILLIN 500 MG PO CAPS: 500.0000 mg | ORAL_CAPSULE | Freq: Three times a day (TID) | ORAL | Status: DC

## 2014-12-06 MED ORDER — HYDROMORPHONE HCL 1 MG/ML IJ SOLN
INTRAMUSCULAR | Status: AC
  Filled 2014-12-06: qty 1

## 2014-12-06 MED ORDER — AMOXICILLIN 500 MG PO CAPS
500.0000 mg | ORAL_CAPSULE | Freq: Once | ORAL | Status: AC
  Administered 2014-12-06: 500 mg via ORAL

## 2014-12-06 MED ORDER — IBUPROFEN 800 MG PO TABS: 800.0000 mg | ORAL_TABLET | Freq: Three times a day (TID) | ORAL | Status: DC | PRN

## 2014-12-06 MED ORDER — AMOXICILLIN 500 MG PO CAPS
ORAL_CAPSULE | ORAL | Status: AC
  Filled 2014-12-06: qty 1

## 2014-12-06 MED ORDER — OXYCODONE-ACETAMINOPHEN 5-325 MG PO TABS
1.0000 | ORAL_TABLET | Freq: Once | ORAL | Status: AC
  Administered 2014-12-06: 1 via ORAL

## 2014-12-06 MED ORDER — ONDANSETRON HCL 4 MG/2ML IJ SOLN
4.0000 mg | Freq: Once | INTRAMUSCULAR | Status: AC
  Administered 2014-12-06: 4 mg via INTRAVENOUS

## 2014-12-06 NOTE — ED Provider Notes (Signed)
[facility] Emergency Department Provider Note  ____________________________________________  Time seen: Approximately 4:45 PM  I have reviewed the triage vital signs and the nursing notes.   HISTORY  Chief Complaint Dental Pain    HPI Jake Elliott is a 44 y.o. male complaining of right lower jaw dental pain and swelling. Patient said acute onset startedtoday. Patient say he is felt warm as having a fever. Patient is a long-term history of devitalized teeth. Patient rates his pain as a 10 over 10.   Past Medical History  Diagnosis Date  . Attention-deficit/hyperactivity disorder   . Peripheral neuropathy   . Anxiety   . Hypertension   . Angina at rest   . History of tobacco abuse   . Hypertrophic cardiomyopathy     s/p myomectomy    Patient Active Problem List   Diagnosis Date Noted  . Peripheral autonomic neuropathy of unknown cause 11/29/2014  . Cognitive decline 11/23/2014  . History of open heart surgery 11/23/2014  . Chronic LBP 01/25/2013  . Chronic cervical pain 01/25/2013  . Affective bipolar disorder 01/25/2013  . BP (high blood pressure) 01/25/2013  . Cardiomyopathy, hypertrophic obstructive 01/25/2013  . Long term current use of opiate analgesic 01/25/2013  . Disorder of peripheral nervous system 01/25/2013  . Essential hypertension 11/24/2012  . Chest pain 11/24/2012  . Tachycardia 09/15/2011  . Edema 01/29/2011  . HOCM (hypertrophic obstructive cardiomyopathy) 01/29/2011  . OSA (obstructive sleep apnea) 01/29/2011    Past Surgical History  Procedure Laterality Date  . Coronary artery bypass graft      for myomectomy for hypertrophic cardiomyopathy  . Cardiac catheterization      11/14/2010    Current Outpatient Rx  Name  Route  Sig  Dispense  Refill  . buprenorphine-naloxone (SUBOXONE) 8-2 MG SUBL   Sublingual   Place 1 tablet under the tongue daily.         [name] dextroamphetamine (DEXEDRINE) 10 MG 24 hr capsule  Oral   Take 1 capsule (10 mg total) by mouth 2 (two) times daily.   60 capsule   0   . nitroGLYCERIN (NITROSTAT) 0.4 MG SL tablet   Sublingual   Place 1 tablet (0.4 mg total) under the tongue every 5 (five) minutes as needed for chest pain. Patient not taking: Reported on 11/29/2014   25 tablet   6   . verapamil (CALAN) 40 MG tablet   Oral   Take 1 tablet (40 mg total) by mouth 3 (three) times daily as needed. Patient not taking: Reported on 11/29/2014   90 tablet   6   . verapamil (CALAN-SR) 120 MG CR tablet   Oral   Take 1 tablet (120 mg total) by mouth at bedtime. Patient not taking: Reported on 11/29/2014   30 tablet   6     Allergies Codeine and Morphine and related  History reviewed. No pertinent family history.  Social History History  Substance Use Topics  . Smoking status: Current Every Day Smoker -- 1.50 packs/day for 15 years    Types: Cigarettes  . Smokeless tobacco: Not on file  . Alcohol Use: No    Review of Systems Constitutional: Low-grade fever Eyes: No visual changes. ENT: No sore throat. Left dental pain  Cardiovascular: Denies chest pain. Respiratory: Denies shortness of breath. Gastrointestinal: No abdominal pain.  No nausea, no vomiting.  No diarrhea.  No constipation. Genitourinary: Negative for dysuria. Musculoskeletal: Negative for back pain. Skin: Negative for rash. Neurological: Negative for  headaches, focal weakness or numbness. Psychiatric:Anxiety/ADHD Hematological/Lymphatic: Allergic/Immunilogical: See medication list  10-point ROS otherwise negative.  ____________________________________________   PHYSICAL EXAM:  VITAL SIGNS: ED Triage Vitals  Enc Vitals Group     BP 12/06/14 1609 153/108 mmHg     Pulse Rate 12/06/14 1609 152     Resp 12/06/14 1609 20     Temp 12/06/14 1609 100.3 F (37.9 C)     Temp Source 12/06/14 1609 Oral     SpO2 12/06/14 1609 98 %     Weight 12/06/14 1609 200 lb (90.719 kg)     Height  12/06/14 1609 6' (1.829 m)     Head Cir --      Peak Flow --      Pain Score 12/06/14 1611 10     Pain Loc --      Pain Edu? --      Excl. in GC? --     Constitutional: Alert and oriented. Moderate distress Eyes: Conjunctivae are normal. PERRL. EOMI. Head: Atraumatic. Nose: No congestion/rhinnorhea. Mouth/Throat: Mucous membranes are moist.  Oropharynx non-erythematous. Devitalized teeth with edema to the right lower gingiva area. Neck: No stridor. No deformity for nuchal range of motion nontender palpation. Hematological/Lymphatic/Immunilogical: No cervical lymphadenopathy. Cardiovascular: Normal rate, regular rhythm. Grossly normal heart sounds.  Good peripheral circulation. Respiratory: Normal respiratory effort.  No retractions. Lungs CTAB. Gastrointestinal: Soft and nontender. No distention. No abdominal bruits. No CVA tenderness. Musculoskeletal: No lower extremity tenderness nor edema.  No joint effusions. Neurologic:  Normal speech and language. No gross focal neurologic deficits are appreciated. Speech is normal. No gait instability. Skin:  Skin is warm, dry and intact. No rash noted. Psychiatric: Mood and affect are normal. Speech and behavior are normal.  ____________________________________________   LABS (all labs ordered are listed, but only abnormal results are displayed)  Labs Reviewed - No data to display ____________________________________________  EKG   ____________________________________________  RADIOLOGY   ____________________________________________   PROCEDURES  Procedure(s) performed: None  Critical Care performed: No  ____________________________________________   INITIAL IMPRESSION / ASSESSMENT AND PLAN / ED COURSE  Pertinent labs & imaging results that were available during my care of the patient were reviewed by me and considered in my medical decision making (see chart for details).  Dental abscess. Status post IV and by the  clindamycin patient will be referred from the list of dental clinics provided. Patient be discharged with prescription for amoxicillin, Motrin, and Percocet. ____________________________________________   FINAL CLINICAL IMPRESSION(S) / ED DIAGNOSES  Final diagnoses:  Dental abscess      Joni Reining, PA-C 12/06/14 1748  Myrna Blazer, MD 12/06/14 [phone]

## 2014-12-06 NOTE — ED Notes (Signed)
Pt C/O right lower dental pain. Pt reports swelling in right lower cheek.

## 2014-12-06 NOTE — Telephone Encounter (Signed)
Please advise pt that will not be able to dispense another controlled medication at this time.

## 2014-12-06 NOTE — ED Notes (Signed)
Pt was seen earlier today and was told that he had a dental abscess.  Pt was discharged with prescription for antibiotics and pain medication.  Pt reports that he was "too low on cash to pick any of it up".  Pt states that the pain is unbearable which is why he is back.

## 2014-12-06 NOTE — ED Notes (Signed)
Patient seen earlier today for dental problem. Presents again because he is unable to fill prescriptions because he does not have the money and now states his gum is abscessed. Pt is in pain.

## 2014-12-06 NOTE — Discharge Instructions (Signed)
OPTIONS FOR DENTAL FOLLOW UP CARE ° °[place] Department of Health and Human Services - Local Safety Net Dental Clinics °http://www.ncdhhs.gov/dph/oralhealth/services/safetynetclinics.htm °  °[facility] (336-562-3123) ° °[facility] (919-933-9087) ° °[facility] ([phone] ext [phone]) ° °[facility] ([phone]) ° °[facility] ([phone]) °This clinic caters to the indigent population and is on a lottery system. °Location: °[facility], [address], [place] °Clinic Hours: °Wednesdays from 6pm - 9pm, patients seen by a lottery system. °For dates, call or go to [email or web address].[facility].edu/[facility]/patients/Dental-[facility] °Services: °Cleanings, fillings and simple extractions. °Payment Options: °DENTAL WORK IS FREE OF CHARGE. Bring proof of income or support. °Best way to get seen: °Arrive at 5:15 pm - this is a lottery, NOT first come/first serve, so arriving earlier will not increase your chances of being seen. °  °  °[facility] °[phone] °Select option 1 for emergencies °  °Location: °[facility], [address], [place] °Clinic Hours: °No walk-ins accepted - call the day before to schedule an appointment. °Check in times are 9:30 am and 1:30 pm. °Services: °Simple extractions, temporary fillings, pulpectomy/pulp debridement, uncomplicated abscess drainage. °Payment Options: °PAYMENT IS DUE AT THE TIME OF SERVICE.  Fee is usually $100-200, additional surgical procedures (e.g. abscess drainage) may be extra. °Cash, checks, Visa/MasterCard accepted.  Can file Medicaid if patient is covered for dental - patient should call case worker to check. °No discount for [facility] patients. °Best way to get seen: °MUST call the day before and get onto the schedule. Can usually be seen the next 1-2 days. No walk-ins accepted. °  °  °[facility] °[phone] °   °Location: °[facility], [address], [place] °Clinic Hours: °M, W, Th, F 8am or 1:30pm, Tues 9a or 1:30 - first come/first served. °Services: °Simple extractions, temporary fillings, uncomplicated abscess drainage.  You do not need to be an [place] resident. °Payment Options: °PAYMENT IS DUE AT THE TIME OF SERVICE. °Dental insurance, otherwise sliding scale - bring proof of income or support. °Depending on income and treatment needed, cost is usually $50-200. °Best way to get seen: °Arrive early as it is first come/first served. °  °  °[facility] °[phone] °  °Location: °[address] °Clinic Hours: °Mon-Thu 8a-5p °Services: °Most basic dental services including extractions and fillings. °Payment Options: °PAYMENT IS DUE AT THE TIME OF SERVICE. °Sliding scale, up to 50% off - bring proof if income or support. °Medicaid with dental option accepted. °Best way to get seen: °Call to schedule an appointment, can usually be seen within 2 weeks OR they will try to see walk-ins - show up at 8a or 2p (you may have to wait). °  °  °[facility] °919-245-2435 °[place] RESIDENTS ONLY °  °Location: °[facility], [address], [place],  [address] °Clinic Hours: By appointment only. °Monday - Thursday 8am-5pm, Friday 8am-12pm °Services: Cleanings, fillings, extractions. °Payment Options: °PAYMENT IS DUE AT THE TIME OF SERVICE. °Cash, Visa or MasterCard. Sliding scale - $30 minimum per service. °Best way to get seen: °Come in to office, complete packet and make an appointment - need proof of income °or support monies for each household member and proof of [place] residence. °Usually takes about a month to get in. °  °  °[facility] °[phone] °  °Location: °[address]   [place] °Clinic Hours: Walk-in Urgent Care Dental Services are offered Monday-Friday  mornings only. °The numbers of emergencies accepted daily is limited to the number of °providers available. °Maximum 15 - Mondays, Wednesdays & Thursdays °Maximum 10 - Tuesdays & Fridays °Services: °You do not need to be a [place] resident to be seen for a dental emergency. °Emergencies are defined as pain, swelling, abnormal bleeding, or dental trauma. Walkins will receive x-rays if needed. °NOTE: Dental cleaning is not an emergency. °Payment Options: °PAYMENT IS DUE AT THE TIME OF SERVICE. °Minimum co-pay is $40.00 for uninsured patients. °Minimum co-pay is $3.00 for Medicaid with dental coverage. °Dental Insurance is accepted and must be presented at time of visit. °Medicare does not cover dental. °Forms of payment: Cash, credit card, checks. °Best way to get seen: °If not previously registered with the clinic, walk-in dental registration begins at 7:15 am and is on a first come/first serve basis. °If previously registered with the clinic, call to make an appointment. °  °  °[facility] °919-776-4359 °[place] RESIDENTS ONLY °  °Location: °[address], [place], [place] °Clinic Hours: °Mon-Thu 10a-2p °Services: Extractions only! °Payment Options: °FREE (donations accepted) - bring proof of income or support °Best way to get seen: °Call and schedule an appointment OR come at 8am on the 1st Monday of every month (except for holidays) when it is first come/first served. °  °  °[facility] °[phone] °  °Location: °[address], [place] °Clinic Hours: °Friday mornings °Services, Payment Options, Best way to get seen: °Call for info ° °Dental Abscess °A dental abscess is a collection of infected fluid (pus) from a bacterial infection in the inner part of the tooth (pulp). It usually occurs at the end of the tooth's root.  °CAUSES  °· Severe tooth decay. °· Trauma to the tooth that allows bacteria to enter into the pulp, such as a broken or chipped tooth. °SYMPTOMS  °· Severe pain in and  around the infected tooth. °· Swelling and redness around the abscessed tooth or in the mouth or face. °· Tenderness. °· Pus drainage. °· Bad breath. °· Bitter taste in the mouth. °· Difficulty swallowing. °· Difficulty opening the mouth. °· Nausea. °· Vomiting. °· Chills. °· Swollen neck glands. °DIAGNOSIS  °· A medical and dental history will be taken. °· An examination will be performed by tapping on the abscessed tooth. °· X-rays may be taken of the tooth to identify the abscess. °TREATMENT °The goal of treatment is to eliminate the infection. You may be prescribed antibiotic medicine to stop the infection from spreading. A root canal may be performed to save the tooth. If the tooth cannot be saved, it may be pulled (extracted) and the abscess may be drained.  °HOME CARE INSTRUCTIONS °· Only take over-the-counter or prescription medicines for pain, fever, or discomfort as directed by your caregiver. °· Rinse your mouth (gargle) often with salt water (¼ tsp salt in 8 oz [250 ml] of warm water) to relieve pain or swelling. °· Do not drive after taking pain medicine (narcotics). °· Do not apply heat to the outside of your face. °· Return to your dentist for further treatment as directed. °SEEK MEDICAL CARE IF: °· Your pain is not helped by medicine. °· Your pain is getting worse instead of better. °SEEK IMMEDIATE MEDICAL CARE IF: °· You have a fever or persistent symptoms for more than 2-3 days. °· You have a fever and your symptoms suddenly   get worse.  You have chills or a very bad headache.  You have problems breathing or swallowing.  You have trouble opening your mouth.  You have swelling in the neck or around the eye. Document Released: 06/01/2005 Document Revised: 02/24/2012 Document Reviewed: 09/09/2010 [facility] Patient Information 2015 [employer], [place]. This information is not intended to replace advice given to you by your health care provider. Make sure you discuss any questions you have with your  health care provider.

## 2014-12-06 NOTE — ED Notes (Signed)
Patient seen earlier today for dental problem. Presents again because he is unable to fill prescriptions and now states his gum is abscessed.

## 2014-12-06 NOTE — ED Provider Notes (Signed)
[facility] Emergency Department Provider Note  ____________________________________________  Time seen: Approximately 11:29 PM  I have reviewed the triage vital signs and the nursing notes.   HISTORY  Chief Complaint Dental Pain    HPI Jake Elliott is a 44 y.o. male patient seen 5 hours ago this department for complaint of dental abscess and pain. Patient was given IV clindamycin and Dilaudid and ibuprofen. Patient discharged with prescription for amoxicillin and Percocets. Patient returns stating he does not have the money to get the prescriptions. Asked for dentist to come in to evaluate him. Advised patient's hospital did not have dental support.   Past Medical History  Diagnosis Date  . Attention-deficit/hyperactivity disorder   . Peripheral neuropathy   . Anxiety   . Hypertension   . Angina at rest   . History of tobacco abuse   . Hypertrophic cardiomyopathy     s/p myomectomy    Patient Active Problem List   Diagnosis Date Noted  . Peripheral autonomic neuropathy of unknown cause 11/29/2014  . Cognitive decline 11/23/2014  . History of open heart surgery 11/23/2014  . Chronic LBP 01/25/2013  . Chronic cervical pain 01/25/2013  . Affective bipolar disorder 01/25/2013  . BP (high blood pressure) 01/25/2013  . Cardiomyopathy, hypertrophic obstructive 01/25/2013  . Long term current use of opiate analgesic 01/25/2013  . Disorder of peripheral nervous system 01/25/2013  . Essential hypertension 11/24/2012  . Chest pain 11/24/2012  . Tachycardia 09/15/2011  . Edema 01/29/2011  . HOCM (hypertrophic obstructive cardiomyopathy) 01/29/2011  . OSA (obstructive sleep apnea) 01/29/2011    Past Surgical History  Procedure Laterality Date  . Coronary artery bypass graft      for myomectomy for hypertrophic cardiomyopathy  . Cardiac catheterization      11/14/2010    Current Outpatient Rx  Name  Route  Sig  Dispense  Refill  . amoxicillin  (AMOXIL) 500 MG capsule   Oral   Take 1 capsule (500 mg total) by mouth 3 (three) times daily.   30 capsule   0   . buprenorphine-naloxone (SUBOXONE) 8-2 MG SUBL   Sublingual   Place 1 tablet under the tongue daily.         [name] dextroamphetamine (DEXEDRINE) 10 MG 24 hr capsule   Oral   Take 1 capsule (10 mg total) by mouth 2 (two) times daily.   60 capsule   0   . ibuprofen (ADVIL,MOTRIN) 800 MG tablet   Oral   Take 1 tablet (800 mg total) by mouth every 8 (eight) hours as needed for moderate pain.   15 tablet   0   . nitroGLYCERIN (NITROSTAT) 0.4 MG SL tablet   Sublingual   Place 1 tablet (0.4 mg total) under the tongue every 5 (five) minutes as needed for chest pain. Patient not taking: Reported on 11/29/2014   25 tablet   6   . oxyCODONE-acetaminophen (PERCOCET) 7.5-325 MG per tablet   Oral   Take 1 tablet by mouth every 4 (four) hours as needed for severe pain.   20 tablet   0   . verapamil (CALAN) 40 MG tablet   Oral   Take 1 tablet (40 mg total) by mouth 3 (three) times daily as needed. Patient not taking: Reported on 11/29/2014   90 tablet   6   . verapamil (CALAN-SR) 120 MG CR tablet   Oral   Take 1 tablet (120 mg total) by mouth at bedtime. Patient not taking: Reported  on 11/29/2014   30 tablet   6     Allergies Codeine and Morphine and related  No family history on file.  Social History History  Substance Use Topics  . Smoking status: Current Every Day Smoker -- 1.50 packs/day for 15 years    Types: Cigarettes  . Smokeless tobacco: Not on file  . Alcohol Use: No    Review of Systems Constitutional: No fever/chills Eyes: No visual changes. ENT: No sore throat. Cardiovascular: Denies chest pain. Respiratory: Denies shortness of breath. Gastrointestinal: No abdominal pain.  No nausea, no vomiting.  No diarrhea.  No constipation. Genitourinary: Negative for dysuria. Musculoskeletal: Negative for back pain. Skin: Negative for  rash. Neurological: Negative for headaches, focal weakness or numbness. 10-point ROS otherwise negative.  ____________________________________________   PHYSICAL EXAM:  VITAL SIGNS: ED Triage Vitals  Enc Vitals Group     BP 12/06/14 2252 150/95 mmHg     Pulse Rate 12/06/14 2252 103     Resp 12/06/14 2252 20     Temp 12/06/14 2252 99.2 F (37.3 C)     Temp Source 12/06/14 2252 Oral     SpO2 12/06/14 2252 98 %     Weight 12/06/14 2252 200 lb (90.719 kg)     Height 12/06/14 2252 6' (1.829 m)     Head Cir --      Peak Flow --      Pain Score 12/06/14 2253 10     Pain Loc --      Pain Edu? --      Excl. in GC? --     Constitutional: Alert and oriented. Moderate distress. Afebrile Eyes: Conjunctivae are normal. PERRL. EOMI. Head: Atraumatic. Nose: No congestion/rhinnorhea. Mouth/Throat: Mucous membranes are moist.  Oropharynx non-erythematous. Devitalized teeth Neck: No stridor. No cervical spine tenderness to palpation. Hematological/Lymphatic/Immunilogical: No cervical lymphadenopathy. Cardiovascular: Normal rate, regular rhythm. Grossly normal heart sounds.  Good peripheral circulation. Respiratory: Normal respiratory effort.  No retractions. Lungs CTAB. Gastrointestinal: Soft and nontender. No distention. No abdominal bruits. No CVA tenderness. Musculoskeletal: No lower extremity tenderness nor edema.  No joint effusions. Neurologic:  Normal speech and language. No gross focal neurologic deficits are appreciated. Speech is normal. No gait instability. Skin:  Skin is warm, dry and intact. No rash noted. Psychiatric: Mood and affect are normal. Speech and behavior are normal.  ____________________________________________   LABS (all labs ordered are listed, but only abnormal results are displayed)  Labs Reviewed - No data to  display ____________________________________________  EKG   ____________________________________________  RADIOLOGY   ____________________________________________   PROCEDURES  Procedure(s) performed: None  Critical Care performed: No  ____________________________________________   INITIAL IMPRESSION / ASSESSMENT AND PLAN / ED COURSE  Pertinent labs & imaging results that were available during my care of the patient were reviewed by me and considered in my medical decision making (see chart for details).  Dental abscess.  First visit advised patient to purchase the antibodies and pain medication and follow-up with dental clinic in the morning. Last patient we do not give samples of antibodies all pain medications upon discharge. ____________________________________________   FINAL CLINICAL IMPRESSION(S) / ED DIAGNOSES  Final diagnoses:  Abscess, dental      Joni Reining, PA-C 12/06/14 2336  Myrna Blazer, MD 12/08/14 [phone]

## 2014-12-06 NOTE — Discharge Instructions (Signed)
OPTIONS FOR DENTAL FOLLOW UP CARE ° °[place] Department of Health and Human Services - Local Safety Net Dental Clinics °http://www.ncdhhs.gov/dph/oralhealth/services/safetynetclinics.htm °  °[facility] (336-562-3123) ° °[facility] [place] (919-933-9087) ° °[facility] (919-663-1744 ext [phone]) ° °[facility] ([phone]) ° °[facility] ([phone]) °This clinic caters to the indigent population and is on a lottery system. °Location: °[facility], [address], [place] °Clinic Hours: °Wednesdays from 6pm - 9pm, patients seen by a lottery system. °For dates, call or go to www.med.[facility].edu/[facility]/patients/Dental-[facility] °Services: °Cleanings, fillings and simple extractions. °Payment Options: °DENTAL WORK IS FREE OF CHARGE. Bring proof of income or support. °Best way to get seen: °Arrive at 5:15 pm - this is a lottery, NOT first come/first serve, so arriving earlier will not increase your chances of being seen. °  °  °[facility] °[phone] °Select option 1 for emergencies °  °Location: °[facility], [address], [place] °Clinic Hours: °No walk-ins accepted - call the day before to schedule an appointment. °Check in times are 9:30 am and 1:30 pm. °Services: °Simple extractions, temporary fillings, pulpectomy/pulp debridement, uncomplicated abscess drainage. °Payment Options: °PAYMENT IS DUE AT THE TIME OF SERVICE.  Fee is usually $100-200, additional surgical procedures (e.g. abscess drainage) may be extra. °Cash, checks, Visa/MasterCard accepted.  Can file Medicaid if patient is covered for dental - patient should call case worker to check. °No discount for [facility] patients. °Best way to get seen: °MUST call the day before and get onto the schedule. Can usually be seen the next 1-2 days. No walk-ins accepted. °  °  °[facility] °[phone] °   °Location: °[facility], [address], [place] °Clinic Hours: °M, W, Th, F 8am or 1:30pm, Tues 9a or 1:30 - first come/first served. °Services: °Simple extractions, temporary fillings, uncomplicated abscess drainage.  You do not need to be an [place] resident. °Payment Options: °PAYMENT IS DUE AT THE TIME OF SERVICE. °Dental insurance, otherwise sliding scale - bring proof of income or support. °Depending on income and treatment needed, cost is usually $50-200. °Best way to get seen: °Arrive early as it is first come/first served. °  °  °[facility] °[phone] °  °Location: °[address] °Clinic Hours: °Mon-Thu 8a-5p °Services: °Most basic dental services including extractions and fillings. °Payment Options: °PAYMENT IS DUE AT THE TIME OF SERVICE. °Sliding scale, up to 50% off - bring proof if income or support. °Medicaid with dental option accepted. °Best way to get seen: °Call to schedule an appointment, can usually be seen within 2 weeks OR they will try to see walk-ins - show up at 8a or 2p (you may have to wait). °  °  °[facility] °919-245-2435 °[place] RESIDENTS ONLY °  °Location: °[facility], [address], [place], [place] [address] °Clinic Hours: By appointment only. °Monday - Thursday 8am-5pm, Friday 8am-12pm °Services: Cleanings, fillings, extractions. °Payment Options: °PAYMENT IS DUE AT THE TIME OF SERVICE. °Cash, Visa or MasterCard. Sliding scale - $30 minimum per service. °Best way to get seen: °Come in to office, complete packet and make an appointment - need proof of income °or support monies for each household member and proof of [place] residence. °Usually takes about a month to get in. °  °  °[facility] °[phone] °  °Location: °[address]   [place] °Clinic Hours: Walk-in Urgent Care Dental Services are offered Monday-Friday  mornings only. °The numbers of emergencies accepted daily is limited to the number of °providers available. °Maximum 15 - Mondays, Wednesdays & Thursdays °Maximum 10 - Tuesdays & Fridays °Services: °You do not need to be a [place] resident to be seen for a dental emergency. °Emergencies are defined as pain, swelling, abnormal bleeding, or dental trauma. Walkins will receive x-rays if needed. °NOTE: Dental cleaning is not an emergency. °Payment Options: °PAYMENT IS DUE AT THE TIME OF SERVICE. °Minimum co-pay is $40.00 for uninsured patients. °Minimum co-pay is $3.00 for Medicaid with dental coverage. °Dental Insurance is accepted and must be presented at time of visit. °Medicare does not cover dental. °Forms of payment: Cash, credit card, checks. °Best way to get seen: °If not previously registered with the clinic, walk-in dental registration begins at 7:15 am and is on a first come/first serve basis. °If previously registered with the clinic, call to make an appointment. °  °  °[facility] °[phone] °[place] RESIDENTS ONLY °  °Location: °[address], [place], [place] °Clinic Hours: °Mon-Thu 10a-2p °Services: Extractions only! °Payment Options: °FREE (donations accepted) - bring proof of income or support °Best way to get seen: °Call and schedule an appointment OR come at 8am on the 1st Monday of every month (except for holidays) when it is first come/first served. °  °  °[facility] °[phone] °  °Location: °[address], [place] °Clinic Hours: °Friday mornings °Services, Payment Options, Best way to get seen: °Call for info °

## 2014-12-18 ENCOUNTER — Other Ambulatory Visit: Payer: Self-pay

## 2014-12-18 DIAGNOSIS — F3160 Bipolar disorder, current episode mixed, unspecified: Secondary | ICD-10-CM

## 2014-12-18 NOTE — Telephone Encounter (Signed)
Pt states that he needs you to change his dextroamphetamine to the er 5 mg #60 that he can get cheaper that way.  Also needs refill on adderall  please send to [facility] in [place].

## 2014-12-19 ENCOUNTER — Telehealth: Payer: Self-pay | Admitting: Psychiatry

## 2014-12-20 ENCOUNTER — Other Ambulatory Visit: Payer: Self-pay | Admitting: Psychiatry

## 2014-12-20 MED ORDER — DEXTROAMPHETAMINE SULFATE ER 5 MG PO CP24: 5.0000 mg | ORAL_CAPSULE | Freq: Two times a day (BID) | ORAL | Status: DC

## 2014-12-20 NOTE — Telephone Encounter (Signed)
pt called again today to check on medication. pt was told that dr. was out of the office until 1:00 this afternoon. that I would leave another message for dr. to look at message.

## 2014-12-20 NOTE — Telephone Encounter (Signed)
Please advise pt to bring back his original prescription. Will not prescribe another stimulant prescription at this time unless he returns the first one.

## 2014-12-20 NOTE — Telephone Encounter (Signed)
per dr. Vernon Preyfaheem vebrally, she wanted me to tell pt to return the rx she given pt  I left a message with pt to call our office back

## 2014-12-25 NOTE — Telephone Encounter (Signed)
Pt given prescription last week.

## 2015-01-01 ENCOUNTER — Ambulatory Visit (INDEPENDENT_AMBULATORY_CARE_PROVIDER_SITE_OTHER): Payer: Medicaid Other | Admitting: Psychiatry

## 2015-01-01 ENCOUNTER — Encounter: Payer: Self-pay | Admitting: Psychiatry

## 2015-01-01 VITALS — BP 130/88 | HR 106 | Temp 98.3°F | Ht 72.0 in | Wt 218.4 lb

## 2015-01-01 DIAGNOSIS — F909 Attention-deficit hyperactivity disorder, unspecified type: Secondary | ICD-10-CM

## 2015-01-01 DIAGNOSIS — F988 Other specified behavioral and emotional disorders with onset usually occurring in childhood and adolescence: Secondary | ICD-10-CM

## 2015-01-01 MED ORDER — DEXTROAMPHETAMINE SULFATE ER 10 MG PO CP24: 10.0000 mg | ORAL_CAPSULE | Freq: Two times a day (BID) | ORAL | Status: DC

## 2015-01-01 MED ORDER — AMITRIPTYLINE HCL 25 MG PO TABS: 25.0000 mg | ORAL_TABLET | Freq: Every day | ORAL | Status: DC

## 2015-01-01 NOTE — Progress Notes (Signed)
[facility] MD/PA/NP OP Progress Note  01/01/2015 10:03 AM Jamesetta GeraldsMark Turck  MRN:  [id number]  Subjective:   Patient is a 44 year old male who presented for the follow-up. Patient reported that he has not felt any improvement after he was given the prescription of Dexedrine 5 mg as he was unable to afford the 10 mg  medication. He reported that he has 20 pills now. He wants to go back to the Dexedrine 10 mg again as there is no difference  in the cost of the medication. Patient reported that he is living on" an hour or an hour" basis. He continues to appear apprehensive during the interview and has been medication seeking at this time patient has been calling her repeatedly about the Dexedrine doses and will ask for different prescriptions of 5 or 10 mg as he continues to take his stimulant medications. He reported that he only sleeps here and there. He currently lives with his father. He is not doing any work. He currently denied having any suicidal homicidal ideations or plans. He was later asking me if he can fill this prescription this Friday as he is running out of his prescription again which was given to him on 7 12/20/2014.     Chief Complaint:  Chief Complaint    Follow-up; Anxiety; Depression     Visit Diagnosis:   No diagnosis found.  Past Medical History:  Past Medical History  Diagnosis Date  . Attention-deficit/hyperactivity disorder   . Peripheral neuropathy   . Anxiety   . Hypertension   . Angina at rest   . History of tobacco abuse   . Hypertrophic cardiomyopathy     s/p myomectomy    Past Surgical History  Procedure Laterality Date  . Coronary artery bypass graft      for myomectomy for hypertrophic cardiomyopathy  . Cardiac catheterization      11/14/2010   Family History: No family history on file. Social History:  History   Social History  . Marital Status: Married    Spouse Name: N/A  . Number of Children: N/A  . Years of Education: N/A   Social History Main  Topics  . Smoking status: Current Every Day Smoker -- 1.50 packs/day for 15 years    Types: Cigarettes    Start date: 12/31/1988  . Smokeless tobacco: Never Used  . Alcohol Use: No  . Drug Use: No  . Sexual Activity: Not on file   Other Topics Concern  . None   Social History Narrative   Additional History:  Moved back in with father. He stated that he cannot work with his hands and he used to sculpt and draw. He is in a daze.   Assessment:   Musculoskeletal: Strength & Muscle Tone: within normal limits and decreased Gait & Station: normal, limping slightly  Patient leans: N/A  Psychiatric Specialty Exam: Anxiety Symptoms include nervous/anxious behavior. Patient reports no palpitations.      Review of Systems  Constitutional: Negative for chills.  HENT: Negative for hearing loss and nosebleeds.   Eyes: Negative for photophobia.  Cardiovascular: Negative for palpitations.  Gastrointestinal: Negative for vomiting.  Genitourinary: Negative for urgency.  Neurological: Positive for tremors.  Endo/Heme/Allergies: Negative for environmental allergies.  Psychiatric/Behavioral: Positive for depression. The patient is nervous/anxious.     Blood pressure 130/88, pulse 106, temperature 98.3 F (36.8 C), temperature source Tympanic, height 6' (1.829 m), weight 218 lb 6.4 oz (99.066 kg), SpO2 96 %.Body mass index is 29.61 kg/(m^2).  General  Appearance: Very anxious  Eye Contact:  Fair  Speech:  very anxious  Volume:  Normal  Mood:  Anxious  Affect:  anxious  Thought Process:  Circumstantial  Orientation:  Full (Time, Place, and Person)  Thought Content:  WDL  Suicidal Thoughts:  No  Homicidal Thoughts:  No  Memory:  Immediate;   Fair  Judgement:  Fair  Insight:  Fair  Psychomotor Activity:  Restlessness  Concentration:  Fair  Recall:  [employer] of Knowledge: Fair  Language: Fair  Akathisia:  No  Handed:  Right  AIMS (if indicated):  none  Assets:  Communication  Skills Desire for Improvement Social Support Talents/Skills  ADL's:  Intact  Cognition: WNL  Sleep:  Keep waking up.    Is the patient at risk to self?  No. Has the patient been a risk to self in the past 6 months?  No. Has the patient been a risk to self within the distant past?  No. Is the patient a risk to others?  No. Has the patient been a risk to others in the past 6 months?  No. Has the patient been a risk to others within the distant past?  No.  Current Medications: Current Outpatient Prescriptions  Medication Sig Dispense Refill  . amoxicillin (AMOXIL) 500 MG capsule Take 1 capsule (500 mg total) by mouth 3 (three) times daily. 30 capsule 0  . buprenorphine-naloxone (SUBOXONE) 8-2 MG SUBL Place 1 tablet under the tongue daily.    [name] dextroamphetamine (DEXEDRINE) 10 MG 24 hr capsule Take 1 capsule (10 mg total) by mouth 2 (two) times daily. 60 capsule 0  . dextroamphetamine (DEXEDRINE) 5 MG 24 hr capsule Take 1 capsule (5 mg total) by mouth 2 (two) times daily. 60 capsule 0  . ibuprofen (ADVIL,MOTRIN) 800 MG tablet Take 1 tablet (800 mg total) by mouth every 8 (eight) hours as needed for moderate pain. 15 tablet 0  . nitroGLYCERIN (NITROSTAT) 0.4 MG SL tablet Place 1 tablet (0.4 mg total) under the tongue every 5 (five) minutes as needed for chest pain. (Patient not taking: Reported on 11/29/2014) 25 tablet 6  . oxyCODONE-acetaminophen (PERCOCET) 7.5-325 MG per tablet Take 1 tablet by mouth every 4 (four) hours as needed for severe pain. 20 tablet 0  . verapamil (CALAN) 40 MG tablet Take 1 tablet (40 mg total) by mouth 3 (three) times daily as needed. (Patient not taking: Reported on 11/29/2014) 90 tablet 6  . verapamil (CALAN-SR) 120 MG CR tablet Take 1 tablet (120 mg total) by mouth at bedtime. (Patient not taking: Reported on 11/29/2014) 30 tablet 6   No current facility-administered medications for this visit.    Medical Decision Making:  New problem, with additional work up  planned, Review of Psycho-Social Stressors (1), Decision to obtain old records (1) and Review and summation of old records (2)  Treatment Plan Summary:Medication management   Discussed with patient about his medications and I will continue him on Dexedrine 10 mg by mouth twice a day. He will not be able to fill his prescription before 01/20/2015  I will  also start him on amitriptyline 25 mg by mouth daily at bedtime for insomnia     More than 50% of the time spent in psychoeducation, counseling and coordination of care.    This note was generated in part or whole with voice recognition software. Voice regonition is usually quite accurate but there are transcription errors that can and very often do occur. I apologize  for any typographical errors that were not detected and corrected.    Brandy Hale 01/01/2015, 10:03 AM

## 2015-01-29 ENCOUNTER — Other Ambulatory Visit: Payer: Self-pay

## 2015-01-29 ENCOUNTER — Telehealth: Payer: Self-pay

## 2015-01-29 MED ORDER — DEXTROAMPHETAMINE SULFATE ER 10 MG PO CP24: 10.0000 mg | ORAL_CAPSULE | Freq: Two times a day (BID) | ORAL | Status: DC

## 2015-01-29 NOTE — Telephone Encounter (Signed)
policy states that for c2 drugs they are not allowed to fill with out a new rx. pt rx is high 20 pills a 10 days supply is $55.00.  [facility] and the pharmacy in [place] had the medication but only 20 tabs so pt had rx fill there.

## 2015-01-29 NOTE — Telephone Encounter (Signed)
pt needs a refill on his dextroamphet sul sr . [facility] did not have but 20 tablets so that was all he could get. pt father states to please write rx for 15 day supply

## 2015-01-29 NOTE — Telephone Encounter (Signed)
pt came into office today needing a refill on dextroamphet sul sr . dr Garnetta Buddy did not want to refill medications because pt has hx of just getting 20 tablets at a time. Dr. Garnetta Buddy also wanted pt discharged from client.  pt was taken to my office and told what Dr. Garnetta Buddy wanted, no refills and also being discharged from client. Pt was upset. He stated that this time the pharmacy only had 20 tablets and that they would not give him the remaining amount without a new rx.  He states that they check 5 pharmacy before finding the medications needed.  Pt was asked about the hx of just getting 20 tablets at a time and then asking for more refill pt states it is because he has no insurance and that the medication is cost to much and that was all that he could afford.  Pt was then ask if Dr. Garnetta Buddy knew that he stated no.  Pt was told to leave his phone number and that I would see what I could do to get his medications and to keep him as a patient. Pt was upset and he was told that i would get back with him today.  Even if I had to call my [place] office.

## 2015-01-29 NOTE — Telephone Encounter (Signed)
pt father came into office very upset that dr. Garnetta Buddy wanted to dismiss pt and that she would not refill medication. pt father was ask to have a set in the lobby and that as soon as dr. Garnetta Buddy was done with her other patient i would speak with her.  Spook with dr. Garnetta Buddy. Explained why patient had hx of only getting 20 tablets. Dr. Garnetta Buddy call pharmacy and confirmed everything with them.  After speaking with the patient and his father they agreed to get a 15 day supply which is 30 tablets because of the cost of medication and that when patient was on his last week he would call office and speak with me about getting a refill.  He was told to keep his appt on 03-05-15.  Dr. Garnetta Buddy wrote a rx out for patient and agreed with the terms of 15 day supply and new rx for each month because of the cost.

## 2015-01-29 NOTE — Telephone Encounter (Signed)
Pt came and stated that he ran out of his meds again. Called [facility] and confirmed that he picked up only 20 pills only. His father is here with him and they want only 30 pills dispensed per month as he does not have insurance and cannot afford more pills Will follow up in 1 month.

## 2015-01-29 NOTE — Addendum Note (Signed)
Addended by: Rhunette Croft on: 01/29/2015 12:25 PM   Modules accepted: Orders

## 2015-02-03 ENCOUNTER — Encounter: Payer: Self-pay | Admitting: *Deleted

## 2015-02-03 DIAGNOSIS — L509 Urticaria, unspecified: Secondary | ICD-10-CM | POA: Insufficient documentation

## 2015-02-03 DIAGNOSIS — Z720 Tobacco use: Secondary | ICD-10-CM | POA: Insufficient documentation

## 2015-02-03 DIAGNOSIS — Z79899 Other long term (current) drug therapy: Secondary | ICD-10-CM | POA: Insufficient documentation

## 2015-02-03 DIAGNOSIS — Z7952 Long term (current) use of systemic steroids: Secondary | ICD-10-CM | POA: Insufficient documentation

## 2015-02-03 DIAGNOSIS — I10 Essential (primary) hypertension: Secondary | ICD-10-CM | POA: Insufficient documentation

## 2015-02-03 MED ORDER — METHYLPREDNISOLONE SODIUM SUCC 125 MG IJ SOLR
INTRAMUSCULAR | Status: AC
  Administered 2015-02-03: 125 mg via INTRAVENOUS
  Filled 2015-02-03: qty 2

## 2015-02-03 MED ORDER — FAMOTIDINE IN NACL 20-0.9 MG/50ML-% IV SOLN
20.0000 mg | Freq: Once | INTRAVENOUS | Status: AC
  Administered 2015-02-03: 20 mg via INTRAVENOUS
  Filled 2015-02-03: qty 50

## 2015-02-03 MED ORDER — METHYLPREDNISOLONE SODIUM SUCC 125 MG IJ SOLR
125.0000 mg | Freq: Once | INTRAMUSCULAR | Status: AC
  Administered 2015-02-03: 125 mg via INTRAVENOUS

## 2015-02-03 MED ORDER — DIPHENHYDRAMINE HCL 50 MG/ML IJ SOLN
50.0000 mg | Freq: Once | INTRAMUSCULAR | Status: AC
  Administered 2015-02-03: 50 mg via INTRAVENOUS

## 2015-02-03 MED ORDER — DIPHENHYDRAMINE HCL 50 MG/ML IJ SOLN
INTRAMUSCULAR | Status: AC
  Administered 2015-02-03: 50 mg via INTRAVENOUS
  Filled 2015-02-03: qty 1

## 2015-02-03 NOTE — ED Notes (Signed)
Pt symptoms much improved at this time, with decreased hives on lower abdomen and thigh area. Pt only c/o mild itching and what feels like swelling in his hands.

## 2015-02-03 NOTE — ED Notes (Signed)
Pt states he started having some hives last night, but this afternoon started with intense itching and burning with increased hives.

## 2015-02-04 ENCOUNTER — Emergency Department
Admission: EM | Admit: 2015-02-04 | Discharge: 2015-02-04 | Disposition: A | Discharge status: Home / Self Care | Payer: Self-pay | Attending: Emergency Medicine | Admitting: Emergency Medicine

## 2015-02-04 DIAGNOSIS — R21 Rash and other nonspecific skin eruption: Secondary | ICD-10-CM

## 2015-02-04 DIAGNOSIS — L509 Urticaria, unspecified: Secondary | ICD-10-CM

## 2015-02-04 MED ORDER — PREDNISONE 20 MG PO TABS: 40.0000 mg | ORAL_TABLET | Freq: Every day | ORAL | Status: DC

## 2015-02-04 MED ORDER — FAMOTIDINE 40 MG PO TABS
40.0000 mg | ORAL_TABLET | Freq: Every evening | ORAL | Status: DC
Start: 2015-02-04 — End: 2015-02-11

## 2015-02-04 MED ORDER — DIPHENHYDRAMINE HCL 50 MG/ML IJ SOLN
25.0000 mg | Freq: Once | INTRAMUSCULAR | Status: AC
  Administered 2015-02-04: 25 mg via INTRAVENOUS
  Filled 2015-02-04: qty 1

## 2015-02-04 MED ORDER — PENICILLIN G BENZATHINE 1200000 UNIT/2ML IM SUSP
2.4000 10*6.[IU] | Freq: Once | INTRAMUSCULAR | Status: AC
  Administered 2015-02-04: 2.4 10*6.[IU] via INTRAMUSCULAR
  Filled 2015-02-04: qty 4

## 2015-02-04 NOTE — Discharge Instructions (Signed)
Hives °Hives are itchy, red, swollen areas of the skin. They can vary in size and location on your body. Hives can come and go for hours or several days (acute hives) or for several weeks (chronic hives). Hives do not spread from person to person (noncontagious). They may get worse with scratching, exercise, and emotional stress. °CAUSES  °· Allergic reaction to food, additives, or drugs. °· Infections, including the common cold. °· Illness, such as vasculitis, lupus, or thyroid disease. °· Exposure to sunlight, heat, or cold. °· Exercise. °· Stress. °· Contact with chemicals. °SYMPTOMS  °· Red or white swollen patches on the skin. The patches may change size, shape, and location quickly and repeatedly. °· Itching. °· Swelling of the hands, feet, and face. This may occur if hives develop deeper in the skin. °DIAGNOSIS  °Your caregiver can usually tell what is wrong by performing a physical exam. Skin or blood tests may also be done to determine the cause of your hives. In some cases, the cause cannot be determined. °TREATMENT  °Mild cases usually get better with medicines such as antihistamines. Severe cases may require an emergency epinephrine injection. If the cause of your hives is known, treatment includes avoiding that trigger.  °HOME CARE INSTRUCTIONS  °· Avoid causes that trigger your hives. °· Take antihistamines as directed by your caregiver to reduce the severity of your hives. Non-sedating or low-sedating antihistamines are usually recommended. Do not drive while taking an antihistamine. °· Take any other medicines prescribed for itching as directed by your caregiver. °· Wear loose-fitting clothing. °· Keep all follow-up appointments as directed by your caregiver. °SEEK MEDICAL CARE IF:  °· You have persistent or severe itching that is not relieved with medicine. °· You have painful or swollen joints. °SEEK IMMEDIATE MEDICAL CARE IF:  °· You have a fever. °· Your tongue or lips are swollen. °· You have  trouble breathing or swallowing. °· You feel tightness in the throat or chest. °· You have abdominal pain. °These problems may be the first sign of a life-threatening allergic reaction. Call your local emergency services (911 in [place] °MAKE SURE YOU:  °· Understand these instructions. °· Will watch your condition. °· Will get help right away if you are not doing well or get worse. °Document Released: 06/01/2005 Document Revised: 06/06/2013 Document Reviewed: 08/25/2011 °ExitCare® Patient Information ©2015 [employer]. This information is not intended to replace advice given to you by your health care provider. Make sure you discuss any questions you have with your health care provider. °Rash °A rash is a change in the color or texture of your skin. There are many different types of rashes. You may have other problems that accompany your rash. °CAUSES  °· Infections. °· Allergic reactions. This can include allergies to pets or foods. °· Certain medicines. °· Exposure to certain chemicals, soaps, or cosmetics. °· Heat. °· Exposure to poisonous plants. °· Tumors, both cancerous and noncancerous. °SYMPTOMS  °· Redness. °· Scaly skin. °· Itchy skin. °· Dry or cracked skin. °· Bumps. °· Blisters. °· Pain. °DIAGNOSIS  °Your caregiver may do a physical exam to determine what type of rash you have. A skin sample (biopsy) may be taken and examined under a microscope. °TREATMENT  °Treatment depends on the type of rash you have. Your caregiver may prescribe certain medicines. For serious conditions, you may need to see a skin doctor (dermatologist). °HOME CARE INSTRUCTIONS  °· Avoid the substance that caused your rash. °· Do not scratch your   rash. This can cause infection. °· You may take cool baths to help stop itching. °· Only take over-the-counter or prescription medicines as directed by your caregiver. °· Keep all follow-up appointments as directed by your caregiver. °SEEK IMMEDIATE MEDICAL CARE IF: °· You have  increasing pain, swelling, or redness. °· You have a fever. °· You have new or severe symptoms. °· You have body aches, diarrhea, or vomiting. °· Your rash is not better after 3 days. °MAKE SURE YOU: °· Understand these instructions. °· Will watch your condition. °· Will get help right away if you are not doing well or get worse. °Document Released: 05/22/2002 Document Revised: 08/24/2011 Document Reviewed: 03/16/2011 °ExitCare® Patient Information ©2015 [employer]. This information is not intended to replace advice given to you by your health care provider. Make sure you discuss any questions you have with your health care provider. ° °

## 2015-02-04 NOTE — ED Provider Notes (Signed)
[facility] Emergency Department Provider Note  ____________________________________________  Time seen: Approximately 0040 AM  I have reviewed the triage vital signs and the nursing notes.   HISTORY  Chief Complaint Urticaria    HPI Jake Elliott is a 44 y.o. male who comes in today with swollen red patches on his body and rash on the palms of his hands. The patient reports he woke up this morning with some itching in his palms and elbows and broke out with a rash. He reports it started as hives on his legs and in the crease of his pants. The patient denies any trouble breathing or sick contacts. He has not had any new illnesses or sick contacts. The patient reports that he's never had this before and he has had no new foods or lotions or detergents. The patient reports he was itching significantly so he decided to come in for evaluation. He reports that the rash was initially on the tops of his hands and now are on the palms of his hands. He also reports that there also on the palms of his feet.   Past Medical History  Diagnosis Date  . Attention-deficit/hyperactivity disorder   . Peripheral neuropathy   . Anxiety   . Hypertension   . Angina at rest   . History of tobacco abuse   . Hypertrophic cardiomyopathy     s/p myomectomy  . Bipolar disorder   . PTSD (post-traumatic stress disorder)   . Depression     Patient Active Problem List   Diagnosis Date Noted  . Peripheral autonomic neuropathy of unknown cause 11/29/2014  . Cognitive decline 11/23/2014  . History of open heart surgery 11/23/2014  . Chronic LBP 01/25/2013  . Chronic cervical pain 01/25/2013  . Affective bipolar disorder 01/25/2013  . BP (high blood pressure) 01/25/2013  . Cardiomyopathy, hypertrophic obstructive 01/25/2013  . Long term current use of opiate analgesic 01/25/2013  . Disorder of peripheral nervous system 01/25/2013  . Essential hypertension 11/24/2012  . Chest pain  11/24/2012  . Tachycardia 09/15/2011  . Edema 01/29/2011  . HOCM (hypertrophic obstructive cardiomyopathy) 01/29/2011  . OSA (obstructive sleep apnea) 01/29/2011    Past Surgical History  Procedure Laterality Date  . Coronary artery bypass graft      for myomectomy for hypertrophic cardiomyopathy  . Cardiac catheterization      11/14/2010    Current Outpatient Rx  Name  Route  Sig  Dispense  Refill  . dextroamphetamine (DEXEDRINE) 10 MG 24 hr capsule   Oral   Take 1 capsule (10 mg total) by mouth 2 (two) times daily.   30 capsule   0     To be filled 01/20/15   . famotidine (PEPCID) 40 MG tablet   Oral   Take 1 tablet (40 mg total) by mouth every evening.   7 tablet   0   . predniSONE (DELTASONE) 20 MG tablet   Oral   Take 2 tablets (40 mg total) by mouth daily.   8 tablet   0     Allergies Acetaminophen; Codeine; and Morphine and related  Family History  Problem Relation Age of Onset  . Cancer - Lung Mother   . Skin cancer Mother   . Alcohol abuse Mother   . Drug abuse Mother   . Stroke Father   . Hypertension Father   . Alcohol abuse Father   . Drug abuse Father   . Drug abuse Sister     Social  History Social History  Substance Use Topics  . Smoking status: Current Every Day Smoker -- 1.50 packs/day for 15 years    Types: Cigarettes    Start date: 12/31/1988  . Smokeless tobacco: Never Used  . Alcohol Use: No    Review of Systems Constitutional: No fever/chills Eyes: No visual changes. ENT: No sore throat. Cardiovascular: Denies chest pain. Respiratory: Denies shortness of breath. Gastrointestinal: No abdominal pain.  No nausea, no vomiting.  No diarrhea.  No constipation. Genitourinary: Negative for dysuria. Musculoskeletal: Negative for back pain. Skin: rash. Neurological: Negative for headaches, focal weakness or numbness.  10-point ROS otherwise negative.  ____________________________________________   PHYSICAL EXAM:  VITAL  SIGNS: ED Triage Vitals  Enc Vitals Group     BP 02/03/15 2048 150/112 mmHg     Pulse Rate 02/03/15 2048 100     Resp 02/03/15 2048 18     Temp 02/03/15 2048 98.6 F (37 C)     Temp Source 02/03/15 2048 Oral     SpO2 02/03/15 2048 99 %     Weight 02/03/15 2048 220 lb (99.791 kg)     Height 02/03/15 2048 6' (1.829 m)     Head Cir --      Peak Flow --      Pain Score 02/03/15 2049 6     Pain Loc --      Pain Edu? --      Excl. in GC? --     Constitutional: Alert and oriented. Well appearing and in mild distress. Eyes: Conjunctivae are normal. PERRL. EOMI. Head: Atraumatic. Nose: No congestion/rhinnorhea. Mouth/Throat: Mucous membranes are moist.  Oropharynx non-erythematous. Cardiovascular: Normal rate, regular rhythm. Grossly normal heart sounds.  Good peripheral circulation. Respiratory: Normal respiratory effort.  No retractions. Lungs CTAB. Gastrointestinal: Soft and nontender. No distention. No abdominal bruits. No CVA tenderness. Musculoskeletal: No lower extremity tenderness nor edema.   Neurologic:  Normal speech and language.  Skin:  Skin is warm, dry and intact. Hives noted to thighs and under arms and abdomen. Patient has a macular rash with some central clearing to the palms of his hands. Psychiatric: Mood and affect are normal.   ____________________________________________   LABS (all labs ordered are listed, but only abnormal results are displayed)  Labs Reviewed  RPR   ____________________________________________  EKG  None ____________________________________________  RADIOLOGY  None ____________________________________________   PROCEDURES  Procedure(s) performed: None  Critical Care performed: No  ____________________________________________   INITIAL IMPRESSION / ASSESSMENT AND PLAN / ED COURSE  Pertinent labs & imaging results that were available during my care of the patient were reviewed by me and considered in my medical decision  making (see chart for details).  This is a 44 year old male who comes in today with a rash which started today. The patient does not have any sore throat or lesions on his soles of his feet but he does have lesions on his palms which is concerning for a possibility of syphilis. The patient has not had any sick contacts or contacts with children as well. The patient received Solu-Medrol, Benadryl and Pepcid. I will give the patient a shot of penicillin as well as check an RPR.  I further discussed the rash with the patient. He denies any penile lesions and reports that he was last sexually active 2 years ago. Given the concern for an adult with a palmar rash as well as a rash all over his body I will continue to give the patient a shot of  penicillin G and treat his hives. The patient will be discharged to home. ____________________________________________   FINAL CLINICAL IMPRESSION(S) / ED DIAGNOSES  Final diagnoses:  Rash of hands  Hives      Rebecka Apley, MD 02/04/15 0246

## 2015-02-05 ENCOUNTER — Emergency Department
Admission: EM | Admit: 2015-02-05 | Discharge: 2015-02-06 | Disposition: A | Discharge status: Home / Self Care | Payer: Self-pay | Attending: Emergency Medicine | Admitting: Emergency Medicine

## 2015-02-05 ENCOUNTER — Encounter: Payer: Self-pay | Admitting: Emergency Medicine

## 2015-02-05 DIAGNOSIS — Z720 Tobacco use: Secondary | ICD-10-CM | POA: Insufficient documentation

## 2015-02-05 DIAGNOSIS — L501 Idiopathic urticaria: Secondary | ICD-10-CM | POA: Insufficient documentation

## 2015-02-05 DIAGNOSIS — Z79899 Other long term (current) drug therapy: Secondary | ICD-10-CM | POA: Insufficient documentation

## 2015-02-05 DIAGNOSIS — I10 Essential (primary) hypertension: Secondary | ICD-10-CM | POA: Insufficient documentation

## 2015-02-05 DIAGNOSIS — Z7952 Long term (current) use of systemic steroids: Secondary | ICD-10-CM | POA: Insufficient documentation

## 2015-02-05 LAB — RPR: RPR: NONREACTIVE

## 2015-02-05 MED ORDER — DIPHENHYDRAMINE HCL 50 MG/ML IJ SOLN
50.0000 mg | Freq: Once | INTRAMUSCULAR | Status: AC
  Administered 2015-02-05: 50 mg via INTRAVENOUS
  Filled 2015-02-05: qty 1

## 2015-02-05 MED ORDER — FAMOTIDINE IN NACL 20-0.9 MG/50ML-% IV SOLN
20.0000 mg | Freq: Once | INTRAVENOUS | Status: AC
  Administered 2015-02-05: 20 mg via INTRAVENOUS
  Filled 2015-02-05: qty 50

## 2015-02-05 MED ORDER — METHYLPREDNISOLONE SODIUM SUCC 125 MG IJ SOLR
125.0000 mg | Freq: Once | INTRAMUSCULAR | Status: AC
  Administered 2015-02-05: 125 mg via INTRAVENOUS
  Filled 2015-02-05: qty 2

## 2015-02-05 MED ORDER — SODIUM CHLORIDE 0.9 % IV BOLUS (SEPSIS)
1000.0000 mL | Freq: Once | INTRAVENOUS | Status: AC
  Administered 2015-02-05: 1000 mL via INTRAVENOUS

## 2015-02-05 NOTE — ED Notes (Addendum)
Patient ambulatory to triage with steady gait, without difficulty or distress noted; pt reports itching rash to hands/arms with no known cause; st seen here for same few days ago for same but did not get his prescriptions filled, st "I was just hoping to come back and get a shot"; benadryl taken at 3pm

## 2015-02-05 NOTE — ED Notes (Signed)
Pt presents with possible "pink eye" per his mom. Had called pcp today and medication called in for conjunctivitis which he has taken one dose but eye seemed worse tonight. Also being treated for ringworm. Left eye red and slightly swollen with draining present.

## 2015-02-06 MED ORDER — CYPROHEPTADINE HCL 4 MG PO TABS: 4.0000 mg | ORAL_TABLET | Freq: Three times a day (TID) | ORAL | Status: DC | PRN

## 2015-02-06 NOTE — Discharge Instructions (Signed)
Hives Hives are itchy, red, swollen areas of the skin. They can vary in size and location on your body. Hives can come and go for hours or several days (acute hives) or for several weeks (chronic hives). Hives do not spread from person to person (noncontagious). They may get worse with scratching, exercise, and emotional stress. CAUSES   Allergic reaction to food, additives, or drugs.  Infections, including the common cold.  Illness, such as vasculitis, lupus, or thyroid disease.  Exposure to sunlight, heat, or cold.  Exercise.  Stress.  Contact with chemicals. SYMPTOMS   Red or white swollen patches on the skin. The patches may change size, shape, and location quickly and repeatedly.  Itching.  Swelling of the hands, feet, and face. This may occur if hives develop deeper in the skin. DIAGNOSIS  Your caregiver can usually tell what is wrong by performing a physical exam. Skin or blood tests may also be done to determine the cause of your hives. In some cases, the cause cannot be determined. TREATMENT  Mild cases usually get better with medicines such as antihistamines. Severe cases may require an emergency epinephrine injection. If the cause of your hives is known, treatment includes avoiding that trigger.  HOME CARE INSTRUCTIONS   Avoid causes that trigger your hives.  Take antihistamines as directed by your caregiver to reduce the severity of your hives. Non-sedating or low-sedating antihistamines are usually recommended. Do not drive while taking an antihistamine.  Take any other medicines prescribed for itching as directed by your caregiver.  Wear loose-fitting clothing.  Keep all follow-up appointments as directed by your caregiver. SEEK MEDICAL CARE IF:   You have persistent or severe itching that is not relieved with medicine.  You have painful or swollen joints. SEEK IMMEDIATE MEDICAL CARE IF:   You have a fever.  Your tongue or lips are swollen.  You have  trouble breathing or swallowing.  You feel tightness in the throat or chest.  You have abdominal pain. These problems may be the first sign of a life-threatening allergic reaction. Call your local emergency services (911 in [place] MAKE SURE YOU:   Understand these instructions.  Will watch your condition.  Will get help right away if you are not doing well or get worse. Document Released: 06/01/2005 Document Revised: 06/06/2013 Document Reviewed: 08/25/2011 [facility] Patient Information 2015 [place], [place]. This information is not intended to replace advice given to you by your health care provider. Make sure you discuss any questions you have with your health care provider.   You have a reaction to an unknown trigger, or perhaps no trigger at all. This is called idiopathic urticaria. Take the meds previously prescribed to block and reverse this allergic response. Take the PeriActin in place of Benadryl for daily antihistamine relief. Follow-up with your provider for further testing and management. Take cool showers to avoid increasing itching.

## 2015-02-06 NOTE — ED Provider Notes (Signed)
[facility] Emergency Department Provider Note ____________________________________________  Time seen: 2235  I have reviewed the triage vital signs and the nursing notes.  HISTORY  Chief Complaint  Rash  HPI Jake Elliott is a 44 y.o. male returns to the ED with continued hives rash since evaluation and treatment 2 days prior. He was given prescriptions for prednisone and famotidine, but admits he has not gotten them filled. He last dosed Benadryl at 3 pm without noted improvement. He was hoping to get a shot for his symptoms. She denies shortness or breath or mucous membrane involvement.   Past Medical History  Diagnosis Date  . Attention-deficit/hyperactivity disorder   . Peripheral neuropathy   . Anxiety   . Hypertension   . Angina at rest   . History of tobacco abuse   . Hypertrophic cardiomyopathy     s/p myomectomy  . Bipolar disorder   . PTSD (post-traumatic stress disorder)   . Depression     Patient Active Problem List   Diagnosis Date Noted  . Peripheral autonomic neuropathy of unknown cause 11/29/2014  . Cognitive decline 11/23/2014  . History of open heart surgery 11/23/2014  . Chronic LBP 01/25/2013  . Chronic cervical pain 01/25/2013  . Affective bipolar disorder 01/25/2013  . BP (high blood pressure) 01/25/2013  . Cardiomyopathy, hypertrophic obstructive 01/25/2013  . Long term current use of opiate analgesic 01/25/2013  . Disorder of peripheral nervous system 01/25/2013  . Essential hypertension 11/24/2012  . Chest pain 11/24/2012  . Tachycardia 09/15/2011  . Edema 01/29/2011  . HOCM (hypertrophic obstructive cardiomyopathy) 01/29/2011  . OSA (obstructive sleep apnea) 01/29/2011    Past Surgical History  Procedure Laterality Date  . Coronary artery bypass graft      for myomectomy for hypertrophic cardiomyopathy  . Cardiac catheterization      11/14/2010    Current Outpatient Rx  Name  Route  Sig  Dispense  Refill  .  cyproheptadine (PERIACTIN) 4 MG tablet   Oral   Take 1 tablet (4 mg total) by mouth 3 (three) times daily as needed for allergies.   30 tablet   0   . dextroamphetamine (DEXEDRINE) 10 MG 24 hr capsule   Oral   Take 1 capsule (10 mg total) by mouth 2 (two) times daily.   30 capsule   0     To be filled 01/20/15   . famotidine (PEPCID) 40 MG tablet   Oral   Take 1 tablet (40 mg total) by mouth every evening.   7 tablet   0   . predniSONE (DELTASONE) 20 MG tablet   Oral   Take 2 tablets (40 mg total) by mouth daily.   8 tablet   0    Allergies Acetaminophen; Codeine; and Morphine and related  Family History  Problem Relation Age of Onset  . Cancer - Lung Mother   . Skin cancer Mother   . Alcohol abuse Mother   . Drug abuse Mother   . Stroke Father   . Hypertension Father   . Alcohol abuse Father   . Drug abuse Father   . Drug abuse Sister    Social History Social History  Substance Use Topics  . Smoking status: Current Every Day Smoker -- 1.50 packs/day for 15 years    Types: Cigarettes    Start date: 12/31/1988  . Smokeless tobacco: Never Used  . Alcohol Use: No   Review of Systems  Constitutional: Negative for fever. Eyes: Negative for  visual changes. ENT: Negative for sore throat. Cardiovascular: Negative for chest pain. Respiratory: Negative for shortness of breath. Gastrointestinal: Negative for abdominal pain, vomiting and diarrhea. Genitourinary: Negative for dysuria. Musculoskeletal: Negative for back pain. Skin: Positive for rash. Neurological: Negative for headaches, focal weakness or numbness. ____________________________________________  PHYSICAL EXAM:  VITAL SIGNS: ED Triage Vitals  Enc Vitals Group     BP 02/05/15 2132 114/79 mmHg     Pulse Rate 02/05/15 2132 86     Resp 02/05/15 2132 20     Temp 02/05/15 2132 97.6 F (36.4 C)     Temp Source 02/05/15 2132 Oral     SpO2 02/05/15 2132 98 %     Weight 02/05/15 2132 220 lb (99.791 kg)      Height 02/05/15 2132 6' (1.829 m)     Head Cir --      Peak Flow --      Pain Score --      Pain Loc --      Pain Edu? --      Excl. in GC? --    Constitutional: Alert and oriented. Well appearing and in no distress. Eyes: Conjunctivae are normal. PERRL. Normal extraocular movements. ENT   Head: Normocephalic and atraumatic.   Nose: No congestion/rhinnorhea.   Mouth/Throat: Mucous membranes are moist.   Neck: Supple. No thyromegaly. Hematological/Lymphatic/Immunilogical: No cervical lymphadenopathy. Cardiovascular: Normal rate, regular rhythm.  Respiratory: Normal respiratory effort. No wheezes/rales/rhonchi. Gastrointestinal: Soft and nontender. No distention. Musculoskeletal: Nontender with normal range of motion in all extremities.  Neurologic:  Normal gait without ataxia. Normal speech and language. No gross focal neurologic deficits are appreciated. Skin:  Skin is warm, dry and intact. Global macular eruption consistent with large whelps of urticaria. Psychiatric: Mood and affect are normal. Patient exhibits appropriate insight and judgment. ____________________________________________  PROCEDURES  NS 500 mg IV Benadryl 50 mg IVP Solumedrol 125 mg IVP Famotidine 20 mg IVP ____________________________________________  INITIAL IMPRESSION / ASSESSMENT AND PLAN / ED COURSE  Idiopathic urticaria. Patient advised to fill and dose previously prescribed prednisone and famotidine. Prescription for Periactin given.  Follow-up with primary provider as needed.  ____________________________________________  FINAL CLINICAL IMPRESSION(S) / ED DIAGNOSES  Final diagnoses:  Idiopathic urticaria     Lissa Hoard, PA-C 02/06/15 0023  Sharyn Creamer, MD 02/14/15 1329

## 2015-02-08 ENCOUNTER — Other Ambulatory Visit: Payer: Self-pay

## 2015-02-11 ENCOUNTER — Emergency Department
Admission: EM | Admit: 2015-02-11 | Discharge: 2015-02-11 | Disposition: A | Discharge status: Home / Self Care | Payer: Self-pay | Attending: Student | Admitting: Student

## 2015-02-11 ENCOUNTER — Encounter: Payer: Self-pay | Admitting: Urgent Care

## 2015-02-11 ENCOUNTER — Other Ambulatory Visit: Payer: Self-pay

## 2015-02-11 DIAGNOSIS — I10 Essential (primary) hypertension: Secondary | ICD-10-CM | POA: Insufficient documentation

## 2015-02-11 DIAGNOSIS — L501 Idiopathic urticaria: Secondary | ICD-10-CM | POA: Insufficient documentation

## 2015-02-11 DIAGNOSIS — K029 Dental caries, unspecified: Secondary | ICD-10-CM | POA: Insufficient documentation

## 2015-02-11 DIAGNOSIS — Z79899 Other long term (current) drug therapy: Secondary | ICD-10-CM | POA: Insufficient documentation

## 2015-02-11 DIAGNOSIS — Z720 Tobacco use: Secondary | ICD-10-CM | POA: Insufficient documentation

## 2015-02-11 MED ORDER — FAMOTIDINE IN NACL 20-0.9 MG/50ML-% IV SOLN
20.0000 mg | Freq: Once | INTRAVENOUS | Status: AC
  Administered 2015-02-11: 20 mg via INTRAVENOUS
  Filled 2015-02-11: qty 50

## 2015-02-11 MED ORDER — DIPHENHYDRAMINE HCL 50 MG/ML IJ SOLN
50.0000 mg | Freq: Once | INTRAMUSCULAR | Status: AC
  Administered 2015-02-11: 50 mg via INTRAVENOUS
  Filled 2015-02-11: qty 1

## 2015-02-11 MED ORDER — FAMOTIDINE 20 MG PO TABS: 20.0000 mg | ORAL_TABLET | Freq: Two times a day (BID) | ORAL | Status: DC

## 2015-02-11 MED ORDER — FAMOTIDINE 20 MG PO TABS: 20.0000 mg | ORAL_TABLET | Freq: Every day | ORAL | Status: DC

## 2015-02-11 MED ORDER — SODIUM CHLORIDE 0.9 % IV BOLUS (SEPSIS)
500.0000 mL | Freq: Once | INTRAVENOUS | Status: AC
  Administered 2015-02-11: 500 mL via INTRAVENOUS

## 2015-02-11 MED ORDER — METHYLPREDNISOLONE SODIUM SUCC 125 MG IJ SOLR: 125.0000 mg | Freq: Once | INTRAMUSCULAR | Status: DC

## 2015-02-11 MED ORDER — METHYLPREDNISOLONE 4 MG PO TBPK: ORAL_TABLET | ORAL | Status: DC

## 2015-02-11 NOTE — Discharge Instructions (Signed)
Hives  Hives are itchy, red, puffy (swollen) areas of the skin. Hives can change in size and location on your body. Hives can come and go for hours, days, or weeks. Hives do not spread from person to person (noncontagious). Scratching, exercise, and stress can make your hives worse.  HOME CARE  · Avoid things that cause your hives (triggers).  · Take antihistamine medicines as told by your doctor. Do not drive while taking an antihistamine.  · Take any other medicines for itching as told by your doctor.  · Wear loose-fitting clothing.  · Keep all doctor visits as told.  GET HELP RIGHT AWAY IF:   · You have a fever.  · Your tongue or lips are puffy.  · You have trouble breathing or swallowing.  · You feel tightness in the throat or chest.  · You have belly (abdominal) pain.  · You have lasting or severe itching that is not helped by medicine.  · You have painful or puffy joints.  These problems may be the first sign of a life-threatening allergic reaction. Call your local emergency services (911 in [place]  MAKE SURE YOU:   · Understand these instructions.  · Will watch your condition.  · Will get help right away if you are not doing well or get worse.  Document Released: 03/10/2008 Document Revised: 12/01/2011 Document Reviewed: 08/25/2011  Exit[employer]® Patient Information ©2015 [employer]. This information is not intended to replace advice given to you by your health care provider. Make sure you discuss any questions you have with your health care provider.

## 2015-02-11 NOTE — Progress Notes (Signed)
This has been reorder, not discontinued.  

## 2015-02-11 NOTE — Progress Notes (Signed)
This has been reorder, not discontinued.  discontined the elavil

## 2015-02-11 NOTE — Telephone Encounter (Signed)
This has been reorder, not discontinued.  

## 2015-02-11 NOTE — Telephone Encounter (Signed)
pt needs his refill for another 15 day supply of his dextroamphet . please do this and give to [place] to call pt back.

## 2015-02-11 NOTE — ED Notes (Addendum)
Patient presents with c/o a 2 week history of allergic reaction symptoms. Patient reporting that he has been here before for the same with no follow up outside of ED. Patient with reporting that "everything feels like it is swelling"; (+) laryngeal fullness reported; able to spontaneously maintain airway patency; able to handle oral secretions. Patient with NAD noted in triage; no increased WOB noted; able to speak in complete sentences. Patient completed steroid taper yesterday; reports that he stopped using Benadryl and Cortisone topicals citing the fact that they "dont help".

## 2015-02-11 NOTE — ED Provider Notes (Signed)
[facility] Emergency Department Provider Note  ____________________________________________  Time seen: Approximately 8:46 PM  I have reviewed the triage vital signs and the nursing notes.   HISTORY  Chief Complaint Allergic Reaction    HPI Jake Elliott is a 44 y.o. male here for third visit within 7 days for hives. Last 2 visits patient received IV*steroid-dependent along for H1 and H2 any histamine. After the first visit patient stated he did not fill his prescription for prednisone. Patient said he felt a prescription on his second visit use all the medications and his hives have returned. Patient still does not know he is is exposed to causing this rash. Patient stated he felt like he swollen all over again. Patient denies any dysphagia. Patient is nose also not follow-up with the dentist as directed previously for his multiple dental caries in devitalized teeth.   Past Medical History  Diagnosis Date  . Attention-deficit/hyperactivity disorder   . Peripheral neuropathy   . Anxiety   . Hypertension   . Angina at rest   . History of tobacco abuse   . Hypertrophic cardiomyopathy     s/p myomectomy  . Bipolar disorder   . PTSD (post-traumatic stress disorder)   . Depression     Patient Active Problem List   Diagnosis Date Noted  . Peripheral autonomic neuropathy of unknown cause 11/29/2014  . Cognitive decline 11/23/2014  . History of open heart surgery 11/23/2014  . Chronic LBP 01/25/2013  . Chronic cervical pain 01/25/2013  . Affective bipolar disorder 01/25/2013  . BP (high blood pressure) 01/25/2013  . Cardiomyopathy, hypertrophic obstructive 01/25/2013  . Long term current use of opiate analgesic 01/25/2013  . Disorder of peripheral nervous system 01/25/2013  . Essential hypertension 11/24/2012  . Chest pain 11/24/2012  . Tachycardia 09/15/2011  . Edema 01/29/2011  . HOCM (hypertrophic obstructive cardiomyopathy) 01/29/2011  . OSA  (obstructive sleep apnea) 01/29/2011    Past Surgical History  Procedure Laterality Date  . Coronary artery bypass graft      for myomectomy for hypertrophic cardiomyopathy  . Cardiac catheterization      11/14/2010    Current Outpatient Rx  Name  Route  Sig  Dispense  Refill  . cyproheptadine (PERIACTIN) 4 MG tablet   Oral   Take 1 tablet (4 mg total) by mouth 3 (three) times daily as needed for allergies.   30 tablet   0   . dextroamphetamine (DEXEDRINE) 10 MG 24 hr capsule   Oral   Take 1 capsule (10 mg total) by mouth 2 (two) times daily.   30 capsule   0     To be filled 01/20/15   . famotidine (PEPCID) 40 MG tablet   Oral   Take 1 tablet (40 mg total) by mouth every evening.   7 tablet   0   . predniSONE (DELTASONE) 20 MG tablet   Oral   Take 2 tablets (40 mg total) by mouth daily.   8 tablet   0     Allergies Acetaminophen; Codeine; and Morphine and related  Family History  Problem Relation Age of Onset  . Cancer - Lung Mother   . Skin cancer Mother   . Alcohol abuse Mother   . Drug abuse Mother   . Stroke Father   . Hypertension Father   . Alcohol abuse Father   . Drug abuse Father   . Drug abuse Sister     Social History Social History  Substance Use  Topics  . Smoking status: Current Every Day Smoker -- 1.50 packs/day for 15 years    Types: Cigarettes    Start date: 12/31/1988  . Smokeless tobacco: Never Used  . Alcohol Use: No    Review of Systems Constitutional: No fever/chills Eyes: No visual changes. ENT: No sore throat. Cardiovascular: Denies chest pain. Respiratory: Denies shortness of breath. Gastrointestinal: No abdominal pain.  No nausea, no vomiting.  No diarrhea.  No constipation. Genitourinary: Negative for dysuria. Musculoskeletal: Negative for back pain. Skin: Positive for rash. Neurological: Negative for headaches, focal weakness or numbness. Endocrine:Hypertension Hematological/Lymphatic: Allergic/Immunilogical:  See medication list. 10-point ROS otherwise negative.  ____________________________________________   PHYSICAL EXAM:  VITAL SIGNS: ED Triage Vitals  Enc Vitals Group     BP 02/11/15 2017 156/93 mmHg     Pulse Rate 02/11/15 2017 88     Resp 02/11/15 2017 18     Temp 02/11/15 2017 98.4 F (36.9 C)     Temp Source 02/11/15 2017 Oral     SpO2 02/11/15 2017 99 %     Weight --      Height --      Head Cir --      Peak Flow --      Pain Score 02/11/15 2018 0     Pain Loc --      Pain Edu? --      Excl. in GC? --     Constitutional: Alert and oriented. Well appearing and in no acute distress. Eyes: Conjunctivae are normal. PERRL. EOMI. Head: Atraumatic. Nose: No congestion/rhinnorhea. Mouth/Throat: Mucous membranes are moist.  Oropharynx non-erythematous. Numerous caries and devitalized denture. Neck: No stridor.  No cervical spine tenderness to palpation. Hematological/Lymphatic/Immunilogical: No cervical lymphadenopathy. Cardiovascular: Normal rate, regular rhythm. Grossly normal heart sounds.  Good peripheral circulation. Respiratory: Normal respiratory effort.  No retractions. Lungs CTAB. Gastrointestinal: Soft and nontender. No distention. No abdominal bruits. No CVA tenderness. Genitourinary:  Musculoskeletal: No lower extremity tenderness nor edema.  No joint effusions. Neurologic:  Normal speech and language. No gross focal neurologic deficits are appreciated. No gait instability. Skin:  Skin is warm, dry and intact. Diffuse macular lesions  Psychiatric: Mood and affect are normal. Speech and behavior are normal.  ____________________________________________   LABS (all labs ordered are listed, but only abnormal results are displayed)  Labs Reviewed - No data to display ____________________________________________  EKG   ____________________________________________  RADIOLOGY   ____________________________________________   PROCEDURES  Procedure(s)  performed: None  Critical Care performed: No  ____________________________________________   INITIAL IMPRESSION / ASSESSMENT AND PLAN / ED COURSE  Pertinent labs & imaging results that were available during my care of the patient were reviewed by me and considered in my medical decision making (see chart for details).  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 Patient advised to follow with dermatology for definitive evaluation and treatment. Patient given a prescription for prednisone and Pepcid. __________________________________________   FINAL CLINICAL IMPRESSION(S) / ED DIAGNOSES  Final diagnoses:  Urticaria, idiopathic      Joni Reining, PA-C 02/11/15 2101  Joni Reining, PA-C 02/11/15 7829  Gayla Doss, MD 02/11/15 [id number]

## 2015-02-11 NOTE — ED Notes (Signed)
Pt currently denies difficulty breathing. No swelling to face, lips, or tongue at this time. Pt states he has a ride home prior to administering medications by this nurse.

## 2015-02-12 MED ORDER — DEXTROAMPHETAMINE SULFATE ER 10 MG PO CP24: 10.0000 mg | ORAL_CAPSULE | Freq: Two times a day (BID) | ORAL | Status: DC

## 2015-03-05 ENCOUNTER — Ambulatory Visit (INDEPENDENT_AMBULATORY_CARE_PROVIDER_SITE_OTHER): Payer: Medicaid Other | Admitting: Psychiatry

## 2015-03-05 ENCOUNTER — Encounter: Payer: Self-pay | Admitting: Psychiatry

## 2015-03-05 VITALS — BP 142/90 | HR 93 | Temp 97.3°F | Ht 70.0 in | Wt 211.0 lb

## 2015-03-05 DIAGNOSIS — F988 Other specified behavioral and emotional disorders with onset usually occurring in childhood and adolescence: Secondary | ICD-10-CM

## 2015-03-05 DIAGNOSIS — F909 Attention-deficit hyperactivity disorder, unspecified type: Secondary | ICD-10-CM

## 2015-03-05 MED ORDER — DEXTROAMPHETAMINE SULFATE ER 10 MG PO CP24: 10.0000 mg | ORAL_CAPSULE | Freq: Two times a day (BID) | ORAL | Status: DC

## 2015-03-05 NOTE — Progress Notes (Signed)
[facility] MD/PA/NP OP Progress Note  03/05/2015 10:09 AM Jake Elliott  MRN:  [id number]  Subjective:  Patient is a 44 year old male with history of ADD who presented for the follow-up appointment. He reported that he has been doing well since he has been started on Dexedrine. He reported that he has also started working and is currently working for almost 14 hours per day. 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 He currently denied having any adverse effects of the medication. He is usually dispense 14 days medication at this time. Patient reported that now he started working and is able to afford medications on a monthly basis. He is currently living with his father. He denied having any suicidal homicidal ideations or plans. He denied having any depressive symptoms.   Chief Complaint:   Medication refill Visit Diagnosis:     ICD-9-CM ICD-10-CM   1. ADD (attention deficit disorder) 314.00 F90.9     Past Medical History:  Past Medical History  Diagnosis Date  . Attention-deficit/hyperactivity disorder   . Peripheral neuropathy   . Anxiety   . Hypertension   . Angina at rest   . History of tobacco abuse   . Hypertrophic cardiomyopathy     s/p myomectomy  . Bipolar disorder   . PTSD (post-traumatic stress disorder)   . Depression     Past Surgical History  Procedure Laterality Date  . Coronary artery bypass graft      for myomectomy for hypertrophic cardiomyopathy  . Cardiac catheterization      11/14/2010   Family History:  Family History  Problem Relation Age of Onset  . Cancer - Lung Mother   . Skin cancer Mother   . Alcohol abuse Mother   . Drug abuse Mother   . Stroke Father   . Hypertension Father   . Alcohol abuse Father   . Drug abuse Father   . Drug abuse Sister    Social History:  Social History   Social History  . Marital Status: Single    Spouse Name: N/A  . Number of Children: N/A  . Years of Education: N/A   Social History Main Topics  .  Smoking status: Current Every Day Smoker -- 1.50 packs/day for 15 years    Types: Cigarettes    Start date: 12/31/1988  . Smokeless tobacco: Never Used  . Alcohol Use: No  . Drug Use: No  . Sexual Activity: No   Other Topics Concern  . None   Social History Narrative   Additional History:  He currently lives with his father.  Assessment:   Musculoskeletal:   Psychiatric Specialty Exam: HPI  ROS  Blood pressure 142/90, pulse 93, temperature 97.3 F (36.3 C), temperature source Tympanic, height  (1.778 m), weight 211 lb (95.709 kg), SpO2 97 %.Body mass index is 30.28 kg/(m^2).  General Appearance: Casual  Eye Contact:  Good  Speech:  Clear and Coherent and Normal Rate  Volume:  Normal  Mood:  Euthymic  Affect:  Congruent  Thought Process:  Goal Directed and Intact  Orientation:  Full (Time, Place, and Person)  Thought Content:  WDL  Suicidal Thoughts:  No  Homicidal Thoughts:  No  Memory:  Immediate;   Fair  Judgement:  Fair  Insight:  Fair  Psychomotor Activity:  Normal  Concentration:  Fair  Recall:  [employer] of Knowledge: Fair  Language: Fair  Akathisia:  No  Handed:  Right  AIMS (if indicated):    Assets:  Communication Skills Desire for Improvement Physical Health Social Support  ADL's:  Intact  Cognition: WNL  Sleep:     Current Medications: Current Outpatient Prescriptions  Medication Sig Dispense Refill  . cyproheptadine (PERIACTIN) 4 MG tablet Take 1 tablet (4 mg total) by mouth 3 (three) times daily as needed for allergies. 30 tablet 0  . dextroamphetamine (DEXEDRINE) 10 MG 24 hr capsule Take 1 capsule (10 mg total) by mouth 2 (two) times daily. 15 days supply- to be filled 02/27/15 30 capsule 0  . dextroamphetamine (DEXEDRINE) 10 MG 24 hr capsule Take 1 capsule (10 mg total) by mouth 2 (two) times daily. 15 days supply 30 capsule 0  . famotidine (PEPCID) 20 MG tablet Take 1 tablet (20 mg total) by mouth daily. 30 tablet 0  .  methylPREDNISolone (MEDROL DOSEPAK) 4 MG TBPK tablet Take Tapered dose as directed 21 tablet 0  . predniSONE (DELTASONE) 20 MG tablet Take 2 tablets (40 mg total) by mouth daily. 8 tablet 0   No current facility-administered medications for this visit.    Medical Decision Making:  Review of Psycho-Social Stressors (1)  Treatment Plan Summary:Medication management   ADD Patient will be started back on Dexedrine 10 mg by mouth twice a day  Follow-up 2 months or earlier   More than 50% of the time spent in psychoeducation, counseling and coordination of care.   time spent with the patient 25 minutes   This note was generated in part or whole with voice recognition software. Voice regonition is usually quite accurate but there are transcription errors that can and very often do occur. I apologize for any typographical errors that were not detected and corrected.    Brandy Hale 03/05/2015, 10:09 AM

## 2015-04-08 ENCOUNTER — Emergency Department
Admission: EM | Admit: 2015-04-08 | Discharge: 2015-04-08 | Disposition: A | Discharge status: Home / Self Care | Payer: Self-pay | Attending: Emergency Medicine | Admitting: Emergency Medicine

## 2015-04-08 ENCOUNTER — Encounter: Payer: Self-pay | Admitting: Emergency Medicine

## 2015-04-08 ENCOUNTER — Emergency Department: Payer: Self-pay

## 2015-04-08 DIAGNOSIS — Z7952 Long term (current) use of systemic steroids: Secondary | ICD-10-CM | POA: Insufficient documentation

## 2015-04-08 DIAGNOSIS — R42 Dizziness and giddiness: Secondary | ICD-10-CM | POA: Insufficient documentation

## 2015-04-08 DIAGNOSIS — I10 Essential (primary) hypertension: Secondary | ICD-10-CM | POA: Insufficient documentation

## 2015-04-08 DIAGNOSIS — R002 Palpitations: Secondary | ICD-10-CM | POA: Insufficient documentation

## 2015-04-08 DIAGNOSIS — Z720 Tobacco use: Secondary | ICD-10-CM | POA: Insufficient documentation

## 2015-04-08 DIAGNOSIS — Z79899 Other long term (current) drug therapy: Secondary | ICD-10-CM | POA: Insufficient documentation

## 2015-04-08 NOTE — ED Provider Notes (Signed)
[facility] Emergency Department Provider Note  ____________________________________________  Time seen: Approximately 4:16 AM  I have reviewed the triage vital signs and the nursing notes.   HISTORY  Chief Complaint Palpitations    HPI Jake Elliott is a 44 y.o. 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 Patient has a history significant for hypertrophic cardiomyopathy s/p myomectomy in 2008. He has not followed up with a cardiologist since. Patient describes a 3 week history of daily episodes of palpitations which are increasing in frequency.Presents tonight from work for evaluation. States there was no change in his symptoms tonight; they did not change in intensity or frequency. Complains of overall fatigue. Denies recent fever, chills, chest pain, shortness of breath, abdominal pain, nausea, vomiting, diarrhea. States sometimes his symptoms makes him feel dizzy. He denies syncope. States symptoms are worse if he is angry or excited. Denies recent travel. States he is only taking his Dexedrine for ADHD. Admits he has not taken Lopressor as directed. States he just drinks 2 cups of coffee and several sodas daily. Denies herbal medication use. Denies hormone use. He self- discontinued metoprolol use several years ago because it made him feel bad.   Past Medical History  Diagnosis Date  . Attention-deficit/hyperactivity disorder   . Peripheral neuropathy (HCC)   . Anxiety   . Hypertension   . Angina at rest [facility])   . History of tobacco abuse   . Hypertrophic cardiomyopathy (HCC)     s/p myomectomy  . Bipolar disorder (HCC)   . PTSD (post-traumatic stress disorder)   . Depression     Patient Active Problem List   Diagnosis Date Noted  . Peripheral autonomic neuropathy of unknown cause 11/29/2014  . Cognitive decline 11/23/2014  . History of open heart surgery 11/23/2014  . Chronic LBP 01/25/2013  .  Chronic cervical pain 01/25/2013  . Affective bipolar disorder (HCC) 01/25/2013  . BP (high blood pressure) 01/25/2013  . Cardiomyopathy, 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HCC) 01/25/2013  . Long term current use of opiate analgesic 01/25/2013  . Disorder of peripheral nervous system (HCC) 01/25/2013  . Essential hypertension 11/24/2012  . Chest pain 11/24/2012  . Tachycardia 09/15/2011  . Edema 01/29/2011  . HOCM (hypertrophic obstructive cardiomyopathy) (HCC) 01/29/2011  . OSA (obstructive sleep apnea) 01/29/2011    Past Surgical History  Procedure Laterality Date  . Coronary artery bypass graft      for myomectomy for hypertrophic cardiomyopathy  . Cardiac catheterization      11/14/2010    Current Outpatient Rx  Name  Route  Sig  Dispense  Refill  . dextroamphetamine (DEXEDRINE) 10 MG 24 hr capsule   Oral   Take 1 capsule (10 mg total) by mouth 2 (two) times daily. 15 days supply   30 capsule   0     To be filled 01/20/15   . cyproheptadine (PERIACTIN) 4 MG tablet   Oral   Take 1 tablet (4 mg total) by mouth 3 (three) times daily as needed for allergies.   30 tablet   0   . dextroamphetamine (DEXEDRINE) 10 MG 24 hr capsule   Oral   Take 1 capsule (10 mg total) by mouth 2 (two) times daily. 1 month supply - to be filled 03/13/15   60 capsule   0     To be filled 03/13/15   . famotidine (PEPCID) 20 MG tablet   Oral   Take 1 tablet (  20 mg total) by mouth daily. Patient not taking: Reported on 03/05/2015   30 tablet   0   . methylPREDNISolone (MEDROL DOSEPAK) 4 MG TBPK tablet      Take Tapered dose as directed Patient not taking: Reported on 03/05/2015   21 tablet   0   . predniSONE (DELTASONE) 20 MG tablet   Oral   Take 2 tablets (40 mg total) by mouth daily. Patient not taking: Reported on 03/05/2015   8 tablet   0     Allergies Acetaminophen; Codeine; and Morphine and related  Family History  Problem Relation Age of Onset  . Cancer - Lung Mother   . Skin  cancer Mother   . Alcohol abuse Mother   . Drug abuse Mother   . Stroke Father   . Hypertension Father   . Alcohol abuse Father   . Drug abuse Father   . Drug abuse Sister     Social History Social History  Substance Use Topics  . Smoking status: Current Every Day Smoker -- 1.50 packs/day for 15 years    Types: Cigarettes    Start date: 12/31/1988  . Smokeless tobacco: Never Used  . Alcohol Use: No  Denies illicit drug use.  Review of Systems Constitutional: No fever/chills Eyes: No visual changes. ENT: No sore throat. Cardiovascular: Positive for palpitations. Denies chest pain. Respiratory: Denies shortness of breath. Gastrointestinal: No abdominal pain.  No nausea, no vomiting.  No diarrhea.  No constipation. Genitourinary: Negative for dysuria. Musculoskeletal: Negative for back pain. Skin: Negative for rash. Neurological: Negative for headaches, focal weakness or numbness.  10-point ROS otherwise negative.  ____________________________________________   PHYSICAL EXAM:  VITAL SIGNS: ED Triage Vitals  Enc Vitals Group     BP 04/08/15 0213 173/88 mmHg     Pulse Rate 04/08/15 0213 78     Resp 04/08/15 0213 18     Temp 04/08/15 0213 98.2 F (36.8 C)     Temp Source 04/08/15 0213 Oral     SpO2 04/08/15 0213 97 %     Weight 04/08/15 0201 208 lb (94.348 kg)     Height 04/08/15 0201 6' (1.829 m)     Head Cir --      Peak Flow --      Pain Score --      Pain Loc --      Pain Edu? --      Excl. in GC? --     Constitutional: Alert and oriented. Well appearing and in no acute distress. Eyes: Conjunctivae are normal. PERRL. EOMI. Head: Atraumatic. Nose: No congestion/rhinnorhea. Mouth/Throat: Mucous membranes are moist.  Oropharynx non-erythematous. Neck: No stridor. No carotid bruits. Cardiovascular: Normal rate, regular rhythm. Grossly normal heart sounds.  Good peripheral circulation. Respiratory: Normal respiratory effort.  No retractions. Lungs  CTAB. Gastrointestinal: Soft and nontender. No distention. No abdominal bruits. No CVA tenderness. Musculoskeletal: No lower extremity tenderness nor edema.  No joint effusions. Neurologic:  Normal speech and language. No gross focal neurologic deficits are appreciated. No gait instability. Skin:  Skin is warm, dry and intact. No rash noted. Psychiatric: Mood and affect are flat. Speech and behavior are normal.  ____________________________________________   LABS (all labs ordered are listed, but only abnormal results are displayed)  Labs Reviewed  BASIC METABOLIC PANEL  CBC  TROPONIN I   ____________________________________________  EKG  ED ECG REPORT I, SUNG,JADE J, the attending physician, personally viewed and interpreted this ECG.   Date: 04/08/2015  EKG  Time: 0200  Rate: 79  Rhythm: normal EKG, normal sinus rhythm  Axis: normal  Intervals:LVH  ST&T Change: nonspecific  ____________________________________________  RADIOLOGY  Chest 2 view (viewed by me, interpreted by me secondary to downtime): No acute cardiopulmonary process ____________________________________________   PROCEDURES  Procedure(s) performed: None  Critical Care performed: No  ____________________________________________   INITIAL IMPRESSION / ASSESSMENT AND PLAN / ED COURSE  Pertinent labs & imaging results that were available during my care of the patient were reviewed by me and considered in my medical decision making (see chart for details).  44 year old male with a history of hypertrophic cardiomyopathy s/p myomectomy in 2008 presents for a 3 week history of daily palpitations. Symptoms are no worse tonight compared to usual. Review of patient's records reveal that he visited his cardiothoracic surgeon in 11/2014 requesting referrals for cognitive decline since his heart surgery. He is currently being referred to psychology as well as neurology. Laboratory results noted on paper  secondary to down time. They are unremarkable for significant abnormalities. Troponin is negative. Will refer to cardiology for outpatient follow-up. Strict return precautions given. Patient verbalizes understanding and agrees with plan of care. ____________________________________________   FINAL CLINICAL IMPRESSION(S) / ED DIAGNOSES  Final diagnoses:  Heart palpitations      Irean HongJade J Sung, MD 04/08/15 [phone]

## 2015-04-08 NOTE — ED Notes (Signed)
Patient reports feels like heart is forcefully pumping out blood and it makes him dizzy. Patient with previous surgery for hypotrophic cardiomyopathy.  States worse if angry or excited.  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

## 2015-04-08 NOTE — Discharge Instructions (Signed)
1. Avoid caffeine use whenever possible. 2. Return to the ER for worsening symptoms, persistent vomiting, difficulty breathing, fainting or other concerns.  Palpitations A palpitation is the feeling that your heartbeat is irregular or is faster than normal. It may feel like your heart is fluttering or skipping a beat. Palpitations are usually not a serious problem. However, in some cases, you may need further medical evaluation. CAUSES  Palpitations can be caused by:  Smoking.  Caffeine or other stimulants, such as diet pills or energy drinks.  Alcohol.  Stress and anxiety.  Strenuous physical activity.  Fatigue.  Certain medicines.  Heart disease, especially if you have a history of irregular heart rhythms (arrhythmias), such as atrial fibrillation, atrial flutter, or supraventricular tachycardia.  An improperly working pacemaker or defibrillator. DIAGNOSIS  To find the cause of your palpitations, your health care provider will take your medical history and perform a physical exam. Your health care provider may also have you take a test called an ambulatory electrocardiogram (ECG). An ECG records your heartbeat patterns over a 24-hour period. You may also have other tests, such as:  Transthoracic echocardiogram (TTE). During echocardiography, sound waves are used to evaluate how blood flows through your heart.  Transesophageal echocardiogram (TEE).  Cardiac monitoring. This allows your health care provider to monitor your heart rate and rhythm in real time.  Holter monitor. This is a portable device that records your heartbeat and can help diagnose heart arrhythmias. It allows your health care provider to track your heart activity for several days, if needed.  Stress tests by exercise or by giving medicine that makes the heart beat faster. TREATMENT  Treatment of palpitations depends on the cause of your symptoms and can vary greatly. Most cases of palpitations do not require  any treatment other than time, relaxation, and monitoring your symptoms. Other causes, such as atrial fibrillation, atrial flutter, or supraventricular tachycardia, usually require further treatment. HOME CARE INSTRUCTIONS   Avoid:  Caffeinated coffee, tea, soft drinks, diet pills, and energy drinks.  Chocolate.  Alcohol.  Stop smoking if you smoke.  Reduce your stress and anxiety. Things that can help you relax include:  A method of controlling things in your body, such as your heartbeats, with your mind (biofeedback).  Yoga.  Meditation.  Physical activity such as swimming, jogging, or walking.  Get plenty of rest and sleep. SEEK MEDICAL CARE IF:   You continue to have a fast or irregular heartbeat beyond 24 hours.  Your palpitations occur more often. SEEK IMMEDIATE MEDICAL CARE IF:  You have chest pain or shortness of breath.  You have a severe headache.  You feel dizzy or you faint. MAKE SURE YOU:  Understand these instructions.  Will watch your condition.  Will get help right away if you are not doing well or get worse.   This information is not intended to replace advice given to you by your health care provider. Make sure you discuss any questions you have with your health care provider.   Document Released: 05/29/2000 Document Revised: 06/06/2013 Document Reviewed: 07/31/2011 Elsevier Interactive Patient Education [employer]2016 [employer]vier Inc.

## 2015-04-09 ENCOUNTER — Telehealth: Payer: Self-pay

## 2015-04-09 NOTE — Telephone Encounter (Signed)
pt called left message that need refill on hisdextroamphet but he was wondering if you can do the instant release he said it was about $100 cheaper. Pt last seen on  03-05-15 next appt on  05-02-15

## 2015-04-15 NOTE — Telephone Encounter (Signed)
PT CALLED AGAIN FOR REFILL.  PT AND ACCORDING TO YOUR OFFICE NOTE YOU TOLD PT TO TAKE TWICE A DAY.  PT HAS BEEN TAKING TWICE A DAY.  (SEE YOUR PRINTED OUT OFFICE NOTE)

## 2015-04-16 MED ORDER — DEXTROAMPHETAMINE SULFATE ER 10 MG PO CP24: 10.0000 mg | ORAL_CAPSULE | Freq: Two times a day (BID) | ORAL | Status: DC

## 2015-04-16 NOTE — Telephone Encounter (Signed)
Medication printed and will be given to pt today.

## 2015-04-16 NOTE — Telephone Encounter (Signed)
Left message that rx was ready for pick up .  dextroamphetamin 10 mg 24 hr capsule  Id # [id number]  Order id # [id number]

## 2015-05-01 ENCOUNTER — Ambulatory Visit (INDEPENDENT_AMBULATORY_CARE_PROVIDER_SITE_OTHER): Payer: Self-pay | Admitting: Cardiovascular Disease

## 2015-05-01 ENCOUNTER — Encounter: Payer: Self-pay | Admitting: Cardiovascular Disease

## 2015-05-01 VITALS — BP 150/100 | HR 102 | Ht 71.0 in | Wt 206.2 lb

## 2015-05-01 DIAGNOSIS — R000 Tachycardia, unspecified: Secondary | ICD-10-CM

## 2015-05-01 DIAGNOSIS — I10 Essential (primary) hypertension: Secondary | ICD-10-CM

## 2015-05-01 DIAGNOSIS — I421 Obstructive hypertrophic cardiomyopathy: Secondary | ICD-10-CM

## 2015-05-01 MED ORDER — VERAPAMIL HCL 120 MG PO TABS: 120.0000 mg | ORAL_TABLET | Freq: Three times a day (TID) | ORAL | Status: DC

## 2015-05-01 NOTE — Assessment & Plan Note (Signed)
Recommended he start verapamil 120 mg 3 times a day (half dose could be used 3 times a day to start) Currently does not have insurance, this appeared to be the cheapest option for him, $10 per month Recommended he monitor her heart rate and blood pressure

## 2015-05-01 NOTE — Assessment & Plan Note (Signed)
Sinus tachycardia. Unclear if anxiety playing a role Will use verapamil for possibly even with beta blockers for rate control

## 2015-05-01 NOTE — Patient Instructions (Signed)
Your blood pressure and heart rate are elevated Please start verapamil 1/2 pill three times a day Slowly increase the dose for heart rate control  Check your blood pressure  Please call us if you have new issues that need to be addressed before your next appt.  Your physician wants you to follow-up in: 6 months.  You will receive a reminder letter in the mail two months in advance. If you don't receive a letter, please call our office to schedule the follow-up appointment.

## 2015-05-01 NOTE — Progress Notes (Signed)
Patient ID: Jake Elliott, male    DOB: [birth year], 44 y.o.   MRN: [id number]  HPI Comments: Jake Elliott is a very pleasant 44 year old gentleman who has a history of HOCM, myomectomy in 2008 who reports having lower extremity swelling since that time that waxes and wanes with admission to the hospital for severe edema of the bilateral lower extremities (felt to be lymphedema), no DVT on ultrasound, previous echocardiogram in May showing normal LV systolic function, moderate LVH, normal right ventricular systolic pressures, BNP 336, cardiac catheterization 11/2010  showed no significant coronary artery disease, history of obstructive sleep apnea  unable to tolerate CPAP, who presents for routine followup of his tachycardia  He reports having a history of nonsustained VT.  In follow-up today, he reports having symptomatic ectopy, possibly PVCs He is not taking any medications except for his medication for ADHD Previously was on calcium channel blockers and beta blockers. 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 Did not like the way he felt on metoprolol, previously was willing to try verapamil along for blood pressure. Currently not taking anything Blood pressure has been running high but reports that he feels better this way  EKG on today's visit shows sinus tachycardia with rate 10 2 bpm, LVH, left anterior fascicular block  Other past medical history  history of anxiety depression. He has had a long hospital course requiring assistance from psychiatry.  He has had chronic pain syndrome. Recently he has been trying to wean himself off his pain medication.  He has been noticing increasing frequency of palpitations, runs of tachycardia. Blood pressure has been running high. He no longer takes his metoprolol as it was making him feel fatigued. He denies any significant shortness of breath. Sometimes has significant left chest, left arm pain.  In the past, he was  taking metoprolol and verapamil at the same time for his symptoms of tachycardia and outflow tract obstruction. He states that this caused a second-degree heart block and he was continued on metoprolol. He does not like the way metoprolol makes him feel. He would be willing to try verapamil for blood pressure.  Prior Holter monitor in 2013 showing normal sinus rhythm with APCs and PVCs   Allergies  Allergen Reactions  . Acetaminophen Other (See Comments)    headache  . Codeine     Nausea & headache  . Morphine And Related     Nausea & headache.    Current Outpatient Prescriptions on File Prior to Visit  Medication Sig Dispense Refill  . dextroamphetamine (DEXEDRINE) 10 MG 24 hr capsule Take 1 capsule (10 mg total) by mouth 2 (two) times daily. 15 days supply 30 capsule 0   No current facility-administered medications on file prior to visit.    Past Medical History  Diagnosis Date  . Attention-deficit/hyperactivity disorder   . Peripheral neuropathy (HCC)   . Anxiety   . Hypertension   . Angina at rest [facility](HCC)   . History of tobacco abuse   . Hypertrophic cardiomyopathy (HCC)     s/p myomectomy  . Bipolar disorder (HCC)   . PTSD (post-traumatic stress disorder)   . Depression     Past Surgical History  Procedure Laterality Date  . Coronary artery bypass graft      for myomectomy for hypertrophic cardiomyopathy  . Cardiac catheterization      11/14/2010    Social History  reports that he has been smoking Cigarettes.  He started smoking about  26 years ago. He has a 22.5 pack-year smoking history. He has never used smokeless tobacco. He reports that he does not drink alcohol or use illicit drugs.  Family History family history includes Alcohol abuse in his father and mother; Cancer - Lung in his mother; Drug abuse in his father, mother, and sister; Hypertension in his father; Skin cancer in his mother; Stroke in his father.  Review of Systems  Constitutional: Negative.    Respiratory: Negative.   Cardiovascular: Positive for palpitations.       Tachycardia  Gastrointestinal: Negative.   Musculoskeletal: Negative.   Neurological: Negative.   Psychiatric/Behavioral: Negative.   All other systems reviewed and are negative.   BP 150/100 mmHg  Pulse 102  Ht  (1.803 m)  Wt 206 lb 4 oz (93.554 kg)  BMI 28.78 kg/m2  Physical Exam  Constitutional: He is oriented to person, place, and time. He appears well-developed and well-nourished.  HENT:  Head: Normocephalic.  Nose: Nose normal.  Mouth/Throat: Oropharynx is clear and moist.  Eyes: Conjunctivae are normal. Pupils are equal, round, and reactive to light.  Neck: Normal range of motion. Neck supple. No JVD present.  Cardiovascular: Normal rate, regular rhythm, S1 normal, S2 normal, normal heart sounds and intact distal pulses.  Exam reveals no gallop and no friction rub.   No murmur heard. Pulmonary/Chest: Effort normal and breath sounds normal. No respiratory distress. He has no wheezes. He has no rales. He exhibits no tenderness.  Abdominal: Soft. Bowel sounds are normal. He exhibits no distension. There is no tenderness.  Musculoskeletal: Normal range of motion. He exhibits no edema or tenderness.  Lymphadenopathy:    He has no cervical adenopathy.  Neurological: He is alert and oriented to person, place, and time. Coordination normal.  Skin: Skin is warm and dry. No rash noted. No erythema.  Psychiatric: He has a normal mood and affect. His behavior is normal. Judgment and thought content normal.      Assessment and Plan   Nursing note and vitals reviewed.

## 2015-05-01 NOTE — Assessment & Plan Note (Signed)
Recommended he start verapamil as above If he does have side effects, bystolic 10 mg daily could be used. Samples were provided today

## 2015-05-02 ENCOUNTER — Ambulatory Visit: Payer: Medicaid Other | Admitting: Psychiatry

## 2015-05-07 ENCOUNTER — Ambulatory Visit (INDEPENDENT_AMBULATORY_CARE_PROVIDER_SITE_OTHER): Payer: Medicaid Other | Admitting: Psychiatry

## 2015-05-07 ENCOUNTER — Encounter: Payer: Self-pay | Admitting: Psychiatry

## 2015-05-07 VITALS — BP 160/92 | HR 90 | Temp 97.7°F | Ht 71.0 in | Wt 209.8 lb

## 2015-05-07 DIAGNOSIS — F900 Attention-deficit hyperactivity disorder, predominantly inattentive type: Secondary | ICD-10-CM

## 2015-05-07 MED ORDER — DEXTROAMPHETAMINE SULFATE 10 MG PO TABS: 10.0000 mg | ORAL_TABLET | Freq: Two times a day (BID) | ORAL | Status: DC

## 2015-05-07 NOTE — Progress Notes (Signed)
BH MD/PA/NP OP Progress Note  05/07/2015 9:20 AM Jake Elliott  MRN:  [id number]  Subjective:  Patient is a 44 year old male with history of ADD who presented for the follow-up appointment. He reported that he has been doing well since he has been started on Dexedrine. He reported that he was working and wants to change the prescription to the regular release Dextrostat. He reported that the pharmacy the running short of the extended release medication. He appeared calm this morning and reported that he was working long hours last night. He currently denied having any adverse effects of the medications. He stated that he feels stable and does not have any more swings anger anxiety or paranoia. 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 Patient appeared cooperative during the interview. No acute symptoms noted at this time.  Chief Complaint:  Chief Complaint    Follow-up; Medication Refill      Medication refill Visit Diagnosis:     ICD-9-CM ICD-10-CM   1. ADD (attention deficit hyperactivity disorder, inattentive type) 314.01 F90.0     Past Medical History:  Past Medical History  Diagnosis Date  . Attention-deficit/hyperactivity disorder   . Peripheral neuropathy (HCC)   . Anxiety   . Hypertension   . Angina at rest [facility])   . History of tobacco abuse   . Hypertrophic cardiomyopathy (HCC)     s/p myomectomy  . Bipolar disorder (HCC)   . PTSD (post-traumatic stress disorder)   . Depression     Past Surgical History  Procedure Laterality Date  . Coronary artery bypass graft      for myomectomy for hypertrophic cardiomyopathy  . Cardiac catheterization      11/14/2010   Family History:  Family History  Problem Relation Age of Onset  . Cancer - Lung Mother   . Skin cancer Mother   . Alcohol abuse Mother   . Drug abuse Mother   . Stroke Father   . Hypertension Father   . Alcohol abuse Father   . Drug abuse Father   . Drug abuse Sister    Social  History:  Social History   Social History  . Marital Status: Single    Spouse Name: N/A  . Number of Children: N/A  . Years of Education: N/A   Social History Main Topics  . Smoking status: Current Every Day Smoker -- 1.50 packs/day for 15 years    Types: Cigarettes    Start date: 12/31/1988  . Smokeless tobacco: Never Used  . Alcohol Use: No  . Drug Use: No  . Sexual Activity: No   Other Topics Concern  . None   Social History Narrative   Additional History:  He currently lives with his father.  Assessment:   Musculoskeletal:   Psychiatric Specialty Exam: HPI   ROS   Blood pressure 160/92, pulse 90, temperature 97.7 F (36.5 C), temperature source Tympanic, height  (1.803 m), weight 209 lb 12.8 oz (95.165 kg), SpO2 99 %.Body mass index is 29.27 kg/(m^2).  General Appearance: Casual  Eye Contact:  Good  Speech:  Clear and Coherent and Normal Rate  Volume:  Normal  Mood:  Euthymic  Affect:  Congruent  Thought Process:  Goal Directed and Intact  Orientation:  Full (Time, Place, and Person)  Thought Content:  WDL  Suicidal Thoughts:  No  Homicidal Thoughts:  No  Memory:  Immediate;   Fair  Judgement:  Fair  Insight:  Fair  Psychomotor Activity:  Normal  Concentration:  Fair  Recall:  [employer]  Fund of Knowledge: Fair  Language: Fair  Akathisia:  No  Handed:  Right  AIMS (if indicated):    Assets:  Communication Skills Desire for Improvement Physical Health Social Support  ADL's:  Intact  Cognition: WNL  Sleep:     Current Medications: Current Outpatient Prescriptions  Medication Sig Dispense Refill  . Aspirin-Caffeine (BC FAST PAIN RELIEF PO) Take by mouth as needed.    [name]. dextroamphetamine (DEXEDRINE) 10 MG 24 hr capsule Take 1 capsule (10 mg total) by mouth 2 (two) times daily. 15 days supply 30 capsule 0  . verapamil (CALAN) 120 MG tablet Take 1 tablet (120 mg total) by mouth 3 (three) times daily. 90 tablet 11   No current  facility-administered medications for this visit.    Medical Decision Making:  Review of Psycho-Social Stressors (1)  Treatment Plan Summary:Medication management   ADD Patient will be started back on Dexedrine - Dextrostat 10 mg by mouth twice a day  Follow-up 2 months or earlier   More than 50% of the time spent in psychoeducation, counseling and coordination of care.   time spent with the patient 25 minutes   This note was generated in part or whole with voice recognition software. Voice regonition is usually quite accurate but there are transcription errors that can and very often do occur. I apologize for any typographical errors that were not detected and corrected.    Brandy HaleUzma Briasia Flinders, MD  05/07/2015, 9:20 AM

## 2015-05-13 NOTE — Progress Notes (Signed)
Pt still taking according to note on 05-07-15

## 2015-05-13 NOTE — Progress Notes (Signed)
Pt is still taking according to note on  05-07-15

## 2015-06-25 ENCOUNTER — Ambulatory Visit: Payer: Medicaid Other | Admitting: Psychiatry

## 2015-06-25 ENCOUNTER — Ambulatory Visit (INDEPENDENT_AMBULATORY_CARE_PROVIDER_SITE_OTHER): Payer: Medicaid Other | Admitting: Psychiatry

## 2015-06-25 ENCOUNTER — Encounter: Payer: Self-pay | Admitting: Psychiatry

## 2015-06-25 VITALS — BP 124/86 | HR 80 | Temp 97.4°F | Ht 71.0 in | Wt 207.0 lb

## 2015-06-25 DIAGNOSIS — F900 Attention-deficit hyperactivity disorder, predominantly inattentive type: Secondary | ICD-10-CM | POA: Diagnosis not present

## 2015-06-25 MED ORDER — DEXTROAMPHETAMINE SULFATE 10 MG PO TABS: 10.0000 mg | ORAL_TABLET | Freq: Two times a day (BID) | ORAL | Status: DC

## 2015-06-25 NOTE — Progress Notes (Signed)
BH MD/PA/NP OP Progress Note  06/25/2015 2:41 PM Jake Elliott  MRN:  [id number]  Subjective:  Patient is a 45 year old male with history of ADD who presented for the follow-up appointment. He reported that he has been doing well since he has been started on Dexedrine. He reported that he does not enjoy the winter weather and has been feeling more tired. He reported that he feels more creative during the spring and the summer. He stated that his 26 year old daughter visited him from [place] and only came from one night with her fianc and friends. He stated that he also went to [place] to visit his family members during the holidays.  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 He reported that he only got 50 pills of Dexedrine as he was running short of the money. Patient reported that the medication is working well and he is compliant with his medications. He currently denied having any mood swings anger anxiety or paranoia. He currently denied having any suicidal homicidal ideations or plans. Patient appeared cooperative during the interview. No acute symptoms noted at this time.  Chief Complaint:  Chief Complaint    Medication Refill; Follow-up      Medication refill Visit Diagnosis:     ICD-9-CM ICD-10-CM   1. ADD (attention deficit hyperactivity disorder, inattentive type) 314.01 F90.0     Past Medical History:  Past Medical History  Diagnosis Date  . Attention-deficit/hyperactivity disorder   . Peripheral neuropathy (HCC)   . Anxiety   . Hypertension   . Angina at rest [facility])   . History of tobacco abuse   . Hypertrophic cardiomyopathy (HCC)     s/p myomectomy  . Bipolar disorder (HCC)   . PTSD (post-traumatic stress disorder)   . Depression     Past Surgical History  Procedure Laterality Date  . Coronary artery bypass graft      for myomectomy for hypertrophic cardiomyopathy  . Cardiac catheterization      11/14/2010   Family History:   Family History  Problem Relation Age of Onset  . Cancer - Lung Mother   . Skin cancer Mother   . Alcohol abuse Mother   . Drug abuse Mother   . Stroke Father   . Hypertension Father   . Alcohol abuse Father   . Drug abuse Father   . Drug abuse Sister    Social History:  Social History   Social History  . Marital Status: Single    Spouse Name: N/A  . Number of Children: N/A  . Years of Education: N/A   Social History Main Topics  . Smoking status: Current Every Day Smoker -- 1.50 packs/day for 15 years    Types: Cigarettes    Start date: 12/31/1988  . Smokeless tobacco: Never Used  . Alcohol Use: No  . Drug Use: No  . Sexual Activity: No   Other Topics Concern  . None   Social History Narrative   Additional History:  He currently lives with his father.  Assessment:   Musculoskeletal:   Psychiatric Specialty Exam: HPI   ROS   Blood pressure 124/86, pulse 80, temperature 97.4 F (36.3 C), temperature source Tympanic, height 5\' 11"  (1.803 m), weight 207 lb (93.895 kg), SpO2 99 %.Body mass index is 28.88 kg/(m^2).  General Appearance: Casual  Eye Contact:  Good  Speech:  Clear and Coherent and Normal Rate  Volume:  Normal  Mood:  Euthymic  Affect:  Congruent  Thought Process:  Goal Directed and Intact  Orientation:  Full (Time, Place, and Person)  Thought Content:  WDL  Suicidal Thoughts:  No  Homicidal Thoughts:  No  Memory:  Immediate;   Fair  Judgement:  Fair  Insight:  Fair  Psychomotor Activity:  Normal  Concentration:  Fair  Recall:  [employer]  Fund of Knowledge: Fair  Language: Fair  Akathisia:  No  Handed:  Right  AIMS (if indicated):    Assets:  Communication Skills Desire for Improvement Physical Health Social Support  ADL's:  Intact  Cognition: WNL  Sleep:     Current Medications: Current Outpatient Prescriptions  Medication Sig Dispense Refill  . Aspirin-Caffeine (BC FAST PAIN RELIEF PO) Take by mouth as needed.    [name].  dextroamphetamine (DEXTROSTAT) 10 MG tablet Take 1 tablet (10 mg total) by mouth 2 (two) times daily. 60 tablet 0  . verapamil (CALAN) 120 MG tablet Take 1 tablet (120 mg total) by mouth 3 (three) times daily. (Patient not taking: Reported on 06/25/2015) 90 tablet 11   No current facility-administered medications for this visit.    Medical Decision Making:  Review of Psycho-Social Stressors (1)  Treatment Plan Summary:Medication management   ADD Patient will continue on Dexedrine 10 mg by mouth twice a day  Follow-up 2 months or earlier   More than 50% of the time spent in psychoeducation, counseling and coordination of care.   time spent with the patient 25 minutes   This note was generated in part or whole with voice recognition software. Voice regonition is usually quite accurate but there are transcription errors that can and very often do occur. I apologize for any typographical errors that were not detected and corrected.    Brandy HaleUzma Kendyn Zaman, MD  06/25/2015, 2:41 PM

## 2015-07-04 ENCOUNTER — Ambulatory Visit: Payer: Medicaid Other | Admitting: Psychiatry

## 2015-08-22 ENCOUNTER — Encounter: Payer: Self-pay | Admitting: Psychiatry

## 2015-08-22 ENCOUNTER — Ambulatory Visit (INDEPENDENT_AMBULATORY_CARE_PROVIDER_SITE_OTHER): Payer: Self-pay | Admitting: Psychiatry

## 2015-08-22 VITALS — BP 138/88 | HR 105 | Temp 97.4°F | Ht 71.0 in | Wt 205.8 lb

## 2015-08-22 DIAGNOSIS — F900 Attention-deficit hyperactivity disorder, predominantly inattentive type: Secondary | ICD-10-CM

## 2015-08-22 MED ORDER — DEXTROAMPHETAMINE SULFATE 10 MG PO TABS: 10.0000 mg | ORAL_TABLET | Freq: Two times a day (BID) | ORAL | Status: DC

## 2015-08-22 NOTE — Progress Notes (Signed)
BH MD/PA/NP OP Progress Note  08/22/2015 2:41 PM Jake GeraldsMark Haber  MRN:  [id number]  Subjective:  Patient is a 45 year old male with history of ADD who presented for the follow-up appointment. He reported that he has been feeling anxious since this morning as he has a lot on his mind. He appeared somewhat hyper and anxious during the interview. He reported that he has a conference in [place] on Sunday and he has to do the presentation. He reported that he moved out of his father's house as he is now living with a person for whom he is currently working. Patient reported that he is not having any adverse effects of his Dexedrine. He is taking the verapamil on a when necessary basis he feels that his blood pressure is elevated. He appeared hyper and jumpy during the interview. He reported that his blood pressure was high couple of days ago. Patient stated that he is sleeping well at this time and has been taking a lot of aspirin and ibuprofen because of chronic back pain. He also drinks 3-4 cups of caffeine as well as carbonated soda as he has to focus. He was also asking for a higher dose of Dexedrine.   Chief Complaint:  Chief Complaint    Follow-up; Medication Refill; Anxiety      Medication refill Visit Diagnosis:     ICD-9-CM ICD-10-CM   1. ADD (attention deficit hyperactivity disorder, inattentive type) 314.01 F90.0     Past Medical History:  Past Medical History  Diagnosis Date  . Attention-deficit/hyperactivity disorder   . Peripheral neuropathy (HCC)   . Anxiety   . Hypertension   . Angina at rest [facility](HCC)   . History of tobacco abuse   . Hypertrophic cardiomyopathy (HCC)     s/p myomectomy  . Bipolar disorder (HCC)   . PTSD (post-traumatic stress disorder)   . Depression     Past Surgical History  Procedure Laterality Date  . Coronary artery bypass graft      for myomectomy for hypertrophic cardiomyopathy  . Cardiac catheterization      11/14/2010   Family History:  Family  History  Problem Relation Age of Onset  . Cancer - Lung Mother   . Skin cancer Mother   . Alcohol abuse Mother   . Drug abuse Mother   . Stroke Father   . Hypertension Father   . Alcohol abuse Father   . Drug abuse Father   . Drug abuse Sister    Social History:  Social History   Social History  . Marital Status: Single    Spouse Name: N/A  . Number of Children: N/A  . Years of Education: N/A   Social History Main Topics  . Smoking status: Current Every Day Smoker -- 1.50 packs/day for 15 years    Types: Cigarettes    Start date: 12/31/1988  . Smokeless tobacco: Never Used  . Alcohol Use: No  . Drug Use: No  . Sexual Activity: No   Other Topics Concern  . None   Social History Narrative   Additional History:  He currently lives with his father.  Assessment:   Musculoskeletal:   Psychiatric Specialty Exam: Anxiety      ROS   Blood pressure 138/88, pulse 105, temperature 97.4 F (36.3 C), temperature source Tympanic, height 5\' 11"  (1.803 m), weight 205 lb 12.8 oz (93.35 kg), SpO2 99 %.Body mass index is 28.72 kg/(m^2).  General Appearance: Casual  Eye Contact:  Good  Speech:  Clear  and Coherent and Normal Rate  Volume:  Normal  Mood:  Euthymic  Affect:  Congruent  Thought Process:  Goal Directed and Intact  Orientation:  Full (Time, Place, and Person)  Thought Content:  WDL  Suicidal Thoughts:  No  Homicidal Thoughts:  No  Memory:  Immediate;   Fair  Judgement:  Fair  Insight:  Fair  Psychomotor Activity:  Normal  Concentration:  Fair  Recall:  [employer] of Knowledge: Fair  Language: Fair  Akathisia:  No  Handed:  Right  AIMS (if indicated):    Assets:  Communication Skills Desire for Improvement Physical Health Social Support  ADL's:  Intact  Cognition: WNL  Sleep:     Current Medications: Current Outpatient Prescriptions  Medication Sig Dispense Refill  . Aspirin-Caffeine (BC FAST PAIN RELIEF PO) Take by mouth as needed.    [name]  dextroamphetamine (DEXTROSTAT) 10 MG tablet Take 1 tablet (10 mg total) by mouth 2 (two) times daily. 60 tablet 0  . dextroamphetamine (DEXTROSTAT) 10 MG tablet Take 1 tablet (10 mg total) by mouth 2 (two) times daily. 60 tablet 0  . verapamil (CALAN) 120 MG tablet Take 1 tablet (120 mg total) by mouth 3 (three) times daily. 90 tablet 11   No current facility-administered medications for this visit.    Medical Decision Making:  Review of Psycho-Social Stressors (1)  Treatment Plan Summary:Medication management   ADD Patient will continue on Dexedrine 10 mg by mouth twice a day  Follow-up 2 months or earlier   More than 50% of the time spent in psychoeducation, counseling and coordination of care.   time spent with the patient 25 minutes   This note was generated in part or whole with voice recognition software. Voice regonition is usually quite accurate but there are transcription errors that can and very often do occur. I apologize for any typographical errors that were not detected and corrected.    Brandy Hale, MD  08/22/2015, 2:41 PM

## 2015-10-22 ENCOUNTER — Encounter: Payer: Self-pay | Admitting: Psychiatry

## 2015-10-22 ENCOUNTER — Ambulatory Visit (INDEPENDENT_AMBULATORY_CARE_PROVIDER_SITE_OTHER): Payer: Self-pay | Admitting: Psychiatry

## 2015-10-22 VITALS — BP 130/84 | HR 94 | Temp 97.1°F | Ht 71.0 in | Wt 200.2 lb

## 2015-10-22 DIAGNOSIS — F310 Bipolar disorder, current episode hypomanic: Secondary | ICD-10-CM

## 2015-10-22 DIAGNOSIS — F900 Attention-deficit hyperactivity disorder, predominantly inattentive type: Secondary | ICD-10-CM

## 2015-10-22 MED ORDER — DEXTROAMPHETAMINE SULFATE 10 MG PO TABS: 10.0000 mg | ORAL_TABLET | Freq: Two times a day (BID) | ORAL | Status: DC

## 2015-10-22 MED ORDER — QUETIAPINE FUMARATE 100 MG PO TABS: 100.0000 mg | ORAL_TABLET | Freq: Every day | ORAL | Status: DC

## 2015-10-22 NOTE — Progress Notes (Signed)
BH MD/PA/NP OP Progress Note  10/22/2015 2:44 PM Jake GeraldsMark Elliott  MRN:  [id number]  Subjective:  Patient is a 45 year old male with history of ADD who presented for the follow-up appointment. He reported that lately he has been experiencing a hypomanic episode and has not slept in the past 3 days. Patient was noted to be rolling his eyes during the interview and was having a poor eye contact. Patient reported that he likes being up instead of being down. He reported that he has episodes of depression and mania and he has been trying to control that on his own. Patient reported that he feels stable on the Dextrostat and has been compliant with his medications.  He reported that he understand his symptoms well. He currently denied having any suicidal homicidal ideations or plans. He appeared hyper during the interview.   Chief Complaint:  Chief Complaint    Follow-up; Medication Refill      Medication refill Visit Diagnosis:     ICD-9-CM ICD-10-CM   1. 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 Bipolar disorder, current episode hypomanic (HCC) 296.40 F31.0     Past Medical History:  Past Medical History  Diagnosis Date  . Attention-deficit/hyperactivity disorder   . Peripheral neuropathy (HCC)   . Anxiety   . Hypertension   . Angina at rest [facility](HCC)   . History of tobacco abuse   . Hypertrophic cardiomyopathy (HCC)     s/p myomectomy  . Bipolar disorder (HCC)   . PTSD (post-traumatic stress disorder)   . Depression     Past Surgical History  Procedure Laterality Date  . Coronary artery bypass graft      for myomectomy for hypertrophic cardiomyopathy  . Cardiac catheterization      11/14/2010   Family History:  Family History  Problem Relation Age of Onset  . Cancer - Lung Mother   . Skin cancer Mother   . Alcohol abuse Mother   . Drug abuse Mother   . Stroke Father   . Hypertension Father   . Alcohol abuse Father   . Drug abuse Father    . Drug abuse Sister    Social History:  Social History   Social History  . Marital Status: Single    Spouse Name: N/A  . Number of Children: N/A  . Years of Education: N/A   Social History Main Topics  . Smoking status: Current Every Day Smoker -- 1.50 packs/day for 15 years    Types: Cigarettes    Start date: 12/31/1988  . Smokeless tobacco: Never Used  . Alcohol Use: No  . Drug Use: No  . Sexual Activity: No   Other Topics Concern  . None   Social History Narrative   Additional History:  He currently lives with his father.  Assessment:   Musculoskeletal:   Psychiatric Specialty Exam: Anxiety      ROS  Blood pressure 130/84, pulse 94, temperature 97.1 F (36.2 C), temperature source Tympanic, height 5\' 11"  (1.803 m), weight 200 lb 3.2 oz (90.81 kg), SpO2 97 %.Body mass index is 27.93 kg/(m^2).  General Appearance: Casual  Eye Contact:  Good  Speech:  Pressured  Volume:  Normal  Mood:  Anxious  Affect:  Inappropriate  Thought Process:  Goal Directed and Intact  Orientation:  Full (Time, Place, and Person)  Thought Content:  WDL  Suicidal Thoughts:  No  Homicidal Thoughts:  No  Memory:  Immediate;   Fair  Judgement:  Fair  Insight:  Fair  Psychomotor Activity:  Normal  Concentration:  Fair  Recall:  [employer] of Knowledge: Fair  Language: Fair  Akathisia:  No  Handed:  Right  AIMS (if indicated):    Assets:  Communication Skills Desire for Improvement Physical Health Social Support  ADL's:  Intact  Cognition: WNL  Sleep:     Current Medications: Current Outpatient Prescriptions  Medication Sig Dispense Refill  . Aspirin-Caffeine (BC FAST PAIN RELIEF PO) Take by mouth as needed.    [name] dextroamphetamine (DEXTROSTAT) 10 MG tablet Take 1 tablet (10 mg total) by mouth 2 (two) times daily. 60 tablet 0  . dextroamphetamine (DEXTROSTAT) 10 MG tablet Take 1 tablet (10 mg total) by mouth 2 (two) times daily. To be filled 09/21/15 60 tablet 0  .  verapamil (CALAN) 120 MG tablet Take 1 tablet (120 mg total) by mouth 3 (three) times daily. (Patient not taking: Reported on 10/22/2015) 90 tablet 11   No current facility-administered medications for this visit.    Medical Decision Making:  Review of Psycho-Social Stressors (1)  Treatment Plan Summary:Medication management   ADD Patient will continue on Dexedrine 10 mg by mouth twice a day. Prescription given for the next 2 months  Mood symptoms I will start him on Seroquel 100 mg by mouth daily at bedtime for his mood symptoms and discussed with him about the same. He demonstrated understanding.  Follow-up 1 months or earlier   More than 50% of the time spent in psychoeducation, counseling and coordination of care.   time spent with the patient 25 minutes   This note was generated in part or whole with voice recognition software. Voice regonition is usually quite accurate but there are transcription errors that can and very often do occur. I apologize for any typographical errors that were not detected and corrected.    Brandy Hale, MD  10/22/2015, 2:44 PM

## 2015-11-20 ENCOUNTER — Ambulatory Visit: Payer: Self-pay | Admitting: Psychiatry

## 2015-11-22 ENCOUNTER — Ambulatory Visit (INDEPENDENT_AMBULATORY_CARE_PROVIDER_SITE_OTHER): Payer: Self-pay | Admitting: Psychiatry

## 2015-11-22 ENCOUNTER — Encounter: Payer: Self-pay | Admitting: Psychiatry

## 2015-11-22 VITALS — BP 128/87 | HR 96 | Temp 96.7°F | Ht 71.0 in | Wt 199.8 lb

## 2015-11-22 DIAGNOSIS — F310 Bipolar disorder, current episode hypomanic: Secondary | ICD-10-CM

## 2015-11-22 DIAGNOSIS — F900 Attention-deficit hyperactivity disorder, predominantly inattentive type: Secondary | ICD-10-CM

## 2015-11-22 MED ORDER — GABAPENTIN 100 MG PO CAPS: 100.0000 mg | ORAL_CAPSULE | Freq: Three times a day (TID) | ORAL | Status: DC

## 2015-11-22 MED ORDER — DEXTROAMPHETAMINE SULFATE 10 MG PO TABS: 10.0000 mg | ORAL_TABLET | Freq: Two times a day (BID) | ORAL | Status: DC

## 2015-11-22 NOTE — Progress Notes (Signed)
BH MD/PA/NP OP Progress Note  11/22/2015 9:43 AM Jamesetta GeraldsMark Archibald  MRN:  [id number]  Subjective:  Patient is a 45 year old male with history of ADD and mood symptoms who presented for the follow-up appointment. He continues to show hyperactivity during the interview. He reported that since he woke up yesterday morning he was very hyper and agitated and he threw the pillow across the room. He did not fill the prescription for Seroquel as it was $100 per month. He stated that he has been taking Dexedrine as it has been helping him otherwise he'll be worse. He still has one prescription left from the previous appointments. He has been compliant with his medications.  Patient was noted to be rolling his eyes during the interview and was having a poor eye contact. Patient reported that he likes being up instead of being down. He reported that he has episodes of impulsive behavior and we discussed about medication options again. We discussed about Neurontin and he reported that he has taken a higher dose in the past. He did not had any side effects of the medications. He stated that he will try the medication again. He currently denied using any drugs and reported that he has not touched any drugs for the past 3 years.  He currently denied having any impulsive behavior, suicidal homicidal ideations or plans. He reported that he can control himself in social situations   Chief Complaint:  Chief Complaint    ADD      Medication refill Visit Diagnosis:     ICD-9-CM ICD-10-CM   1. 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 Bipolar disorder, current episode hypomanic (HCC) 296.40 F31.0     Past Medical History:  Past Medical History  Diagnosis Date  . Attention-deficit/hyperactivity disorder   . Peripheral neuropathy (HCC)   . Anxiety   . Hypertension   . Angina at rest [facility](HCC)   . History of tobacco abuse   . Hypertrophic cardiomyopathy (HCC)     s/p myomectomy   . Bipolar disorder (HCC)   . PTSD (post-traumatic stress disorder)   . Depression     Past Surgical History  Procedure Laterality Date  . Coronary artery bypass graft      for myomectomy for hypertrophic cardiomyopathy  . Cardiac catheterization      11/14/2010   Family History:  Family History  Problem Relation Age of Onset  . Cancer - Lung Mother   . Skin cancer Mother   . Alcohol abuse Mother   . Drug abuse Mother   . Stroke Father   . Hypertension Father   . Alcohol abuse Father   . Drug abuse Father   . Drug abuse Sister    Social History:  Social History   Social History  . Marital Status: Single    Spouse Name: N/A  . Number of Children: N/A  . Years of Education: N/A   Social History Main Topics  . Smoking status: Current Every Day Smoker -- 1.50 packs/day for 15 years    Types: Cigarettes    Start date: 12/31/1988  . Smokeless tobacco: Never Used  . Alcohol Use: No  . Drug Use: No  . Sexual Activity: No   Other Topics Concern  . None   Social History Narrative   Additional History:  He currently lives with his father.  Assessment:   Musculoskeletal:   Psychiatric Specialty Exam: Anxiety      ROS  Blood pressure 128/87, pulse  96, temperature 96.7 F (35.9 C), height  (1.803 m), weight 199 lb 12.8 oz (90.629 kg), SpO2 95 %.Body mass index is 27.88 kg/(m^2).  General Appearance: Casual  Eye Contact:  Good  Speech:  Pressured  Volume:  Normal  Mood:  Anxious  Affect:  Inappropriate  Thought Process:  Goal Directed and Intact  Orientation:  Full (Time, Place, and Person)  Thought Content:  WDL  Suicidal Thoughts:  No  Homicidal Thoughts:  No  Memory:  Immediate;   Fair  Judgement:  Fair  Insight:  Fair  Psychomotor Activity:  Normal  Concentration:  Fair  Recall:  [employer] of Knowledge: Fair  Language: Fair  Akathisia:  No  Handed:  Right  AIMS (if indicated):    Assets:  Communication Skills Desire for  Improvement Physical Health Social Support  ADL's:  Intact  Cognition: WNL  Sleep:     Current Medications: Current Outpatient Prescriptions  Medication Sig Dispense Refill  . Aspirin-Caffeine (BC FAST PAIN RELIEF PO) Take by mouth as needed.    [name] dextroamphetamine (DEXTROSTAT) 10 MG tablet Take 1 tablet (10 mg total) by mouth 2 (two) times daily. 60 tablet 0  . dextroamphetamine (DEXTROSTAT) 10 MG tablet Take 1 tablet (10 mg total) by mouth 2 (two) times daily. 60 tablet 0  . dextroamphetamine (DEXTROSTAT) 10 MG tablet Take 1 tablet (10 mg total) by mouth 2 (two) times daily. To be filled 11/21/15 60 tablet 0  . QUEtiapine (SEROQUEL) 100 MG tablet Take 1 tablet (100 mg total) by mouth at bedtime. 30 tablet 0   No current facility-administered medications for this visit.    Medical Decision Making:  Review of Psycho-Social Stressors (1)  Treatment Plan Summary:Medication management   ADD Patient will continue on Dexedrine 10 mg by mouth twice a day. Prescription given for the next 1 month.   Mood symptoms The start him on Neurontin 100 mg daily and advised him to titrate the dose up to 300 mg. Prescription given for 90 pills  Follow-up 6 weeks  or earlier   More than 50% of the time spent in psychoeducation, counseling and coordination of care.   time spent with the patient 25 minutes   This note was generated in part or whole with voice recognition software. Voice regonition is usually quite accurate but there are transcription errors that can and very often do occur. I apologize for any typographical errors that were not detected and corrected.    Brandy Hale, MD  11/22/2015, 9:43 AM

## 2015-12-17 IMAGING — CR DG CHEST 2V
2 series · 2 of 2 positions shown · non-contrast
Comparison: Chest radiograph performed 12/30/2011

CLINICAL DATA: Acute onset of dizziness and intermittent chest
pain. Initial encounter.

EXAM:
CHEST  2 VIEW

[chest pa]
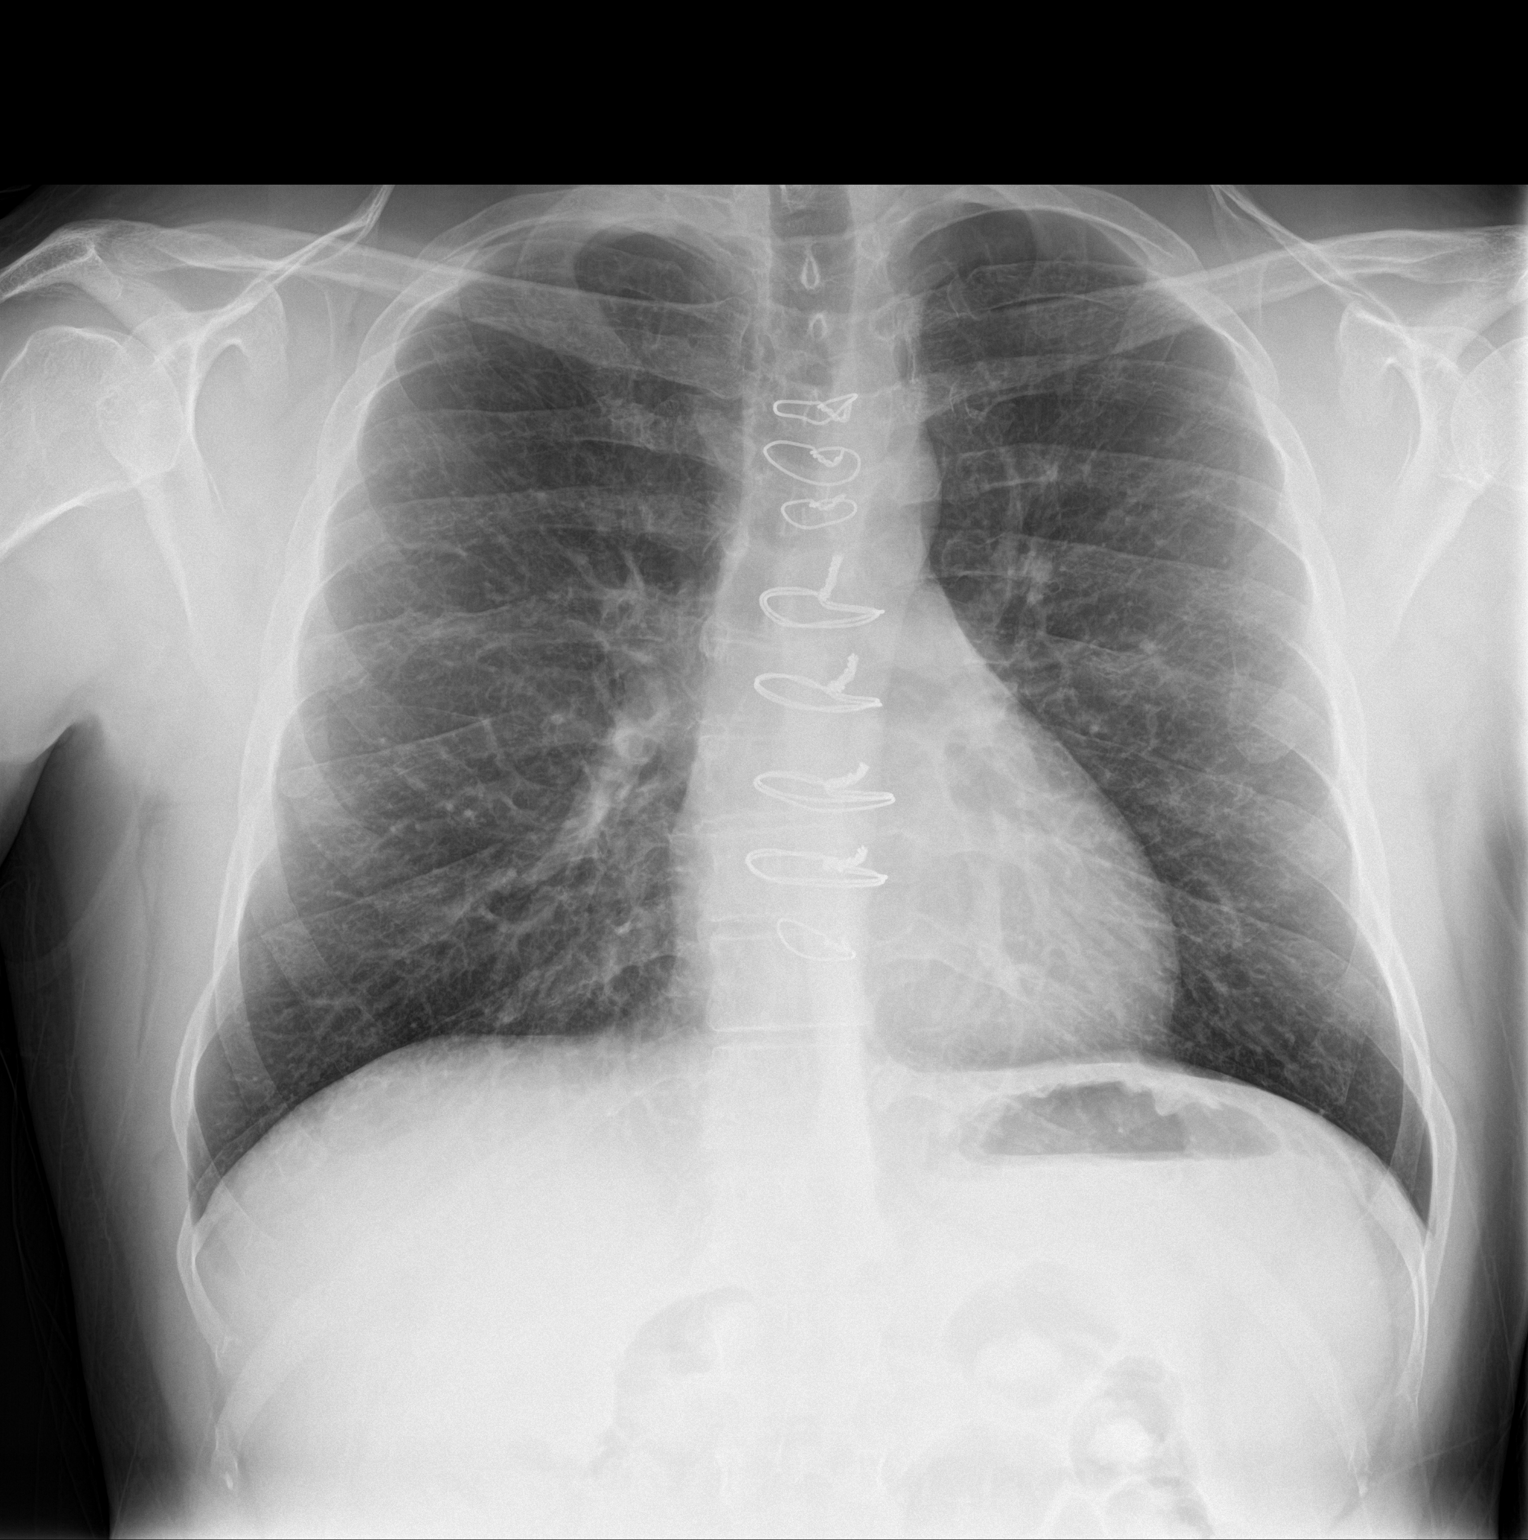

[chest lat]
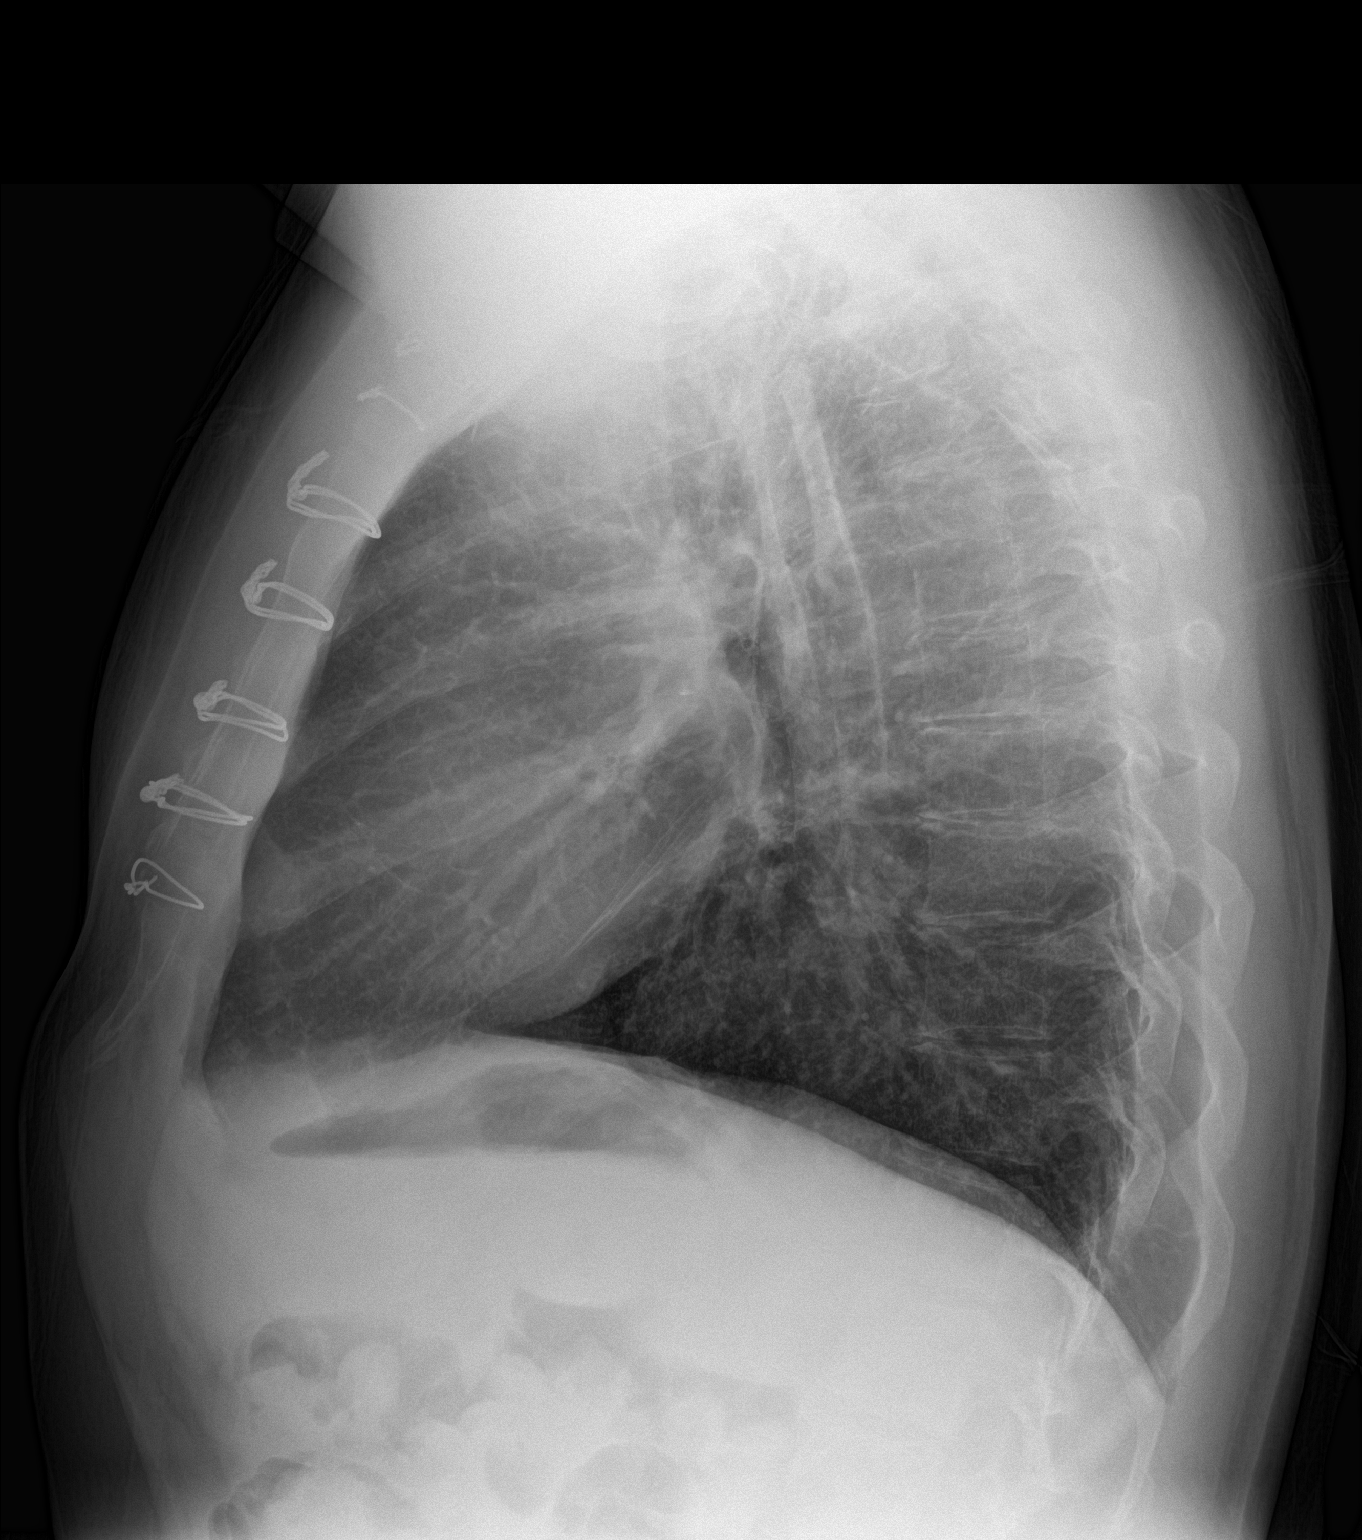

[2 of 2 positions shown; findings below may reference images not displayed]

FINDINGS: The lungs are well-aerated. Mild vascular congestion is noted.
Minimal bilateral atelectasis is seen. There is no evidence of
pleural effusion or pneumothorax.

The heart is normal in size; the patient is status post median
sternotomy. No acute osseous abnormalities are seen.
IMPRESSION: Mild vascular congestion noted.  Minimal bilateral atelectasis seen.

## 2016-01-10 ENCOUNTER — Ambulatory Visit: Payer: No Typology Code available for payment source | Admitting: Psychiatry

## 2016-01-14 ENCOUNTER — Ambulatory Visit (INDEPENDENT_AMBULATORY_CARE_PROVIDER_SITE_OTHER): Payer: Self-pay | Admitting: Psychiatry

## 2016-01-14 DIAGNOSIS — F900 Attention-deficit hyperactivity disorder, predominantly inattentive type: Secondary | ICD-10-CM

## 2016-01-14 MED ORDER — DEXTROAMPHETAMINE SULFATE 10 MG PO TABS: 10.0000 mg | ORAL_TABLET | Freq: Two times a day (BID) | ORAL | 0 refills | Status: DC

## 2016-01-14 NOTE — Progress Notes (Signed)
BH MD/PA/NP OP Progress Note  01/14/2016 3:13 PM Sanad Drum  MRN:  [id number]  Subjective:  Patient is a 45 year old male with history of ADD and mood symptoms who presented for the follow-up appointment. He continues to show hyperactivity during the interview. He reported that he has recently moved in with his girlfriend as he was having relationship issues with his roommate. He was talking in detail about the same. Patient reported that he did not do well on the gabapentin and stopped taking the medication. Patient reported that he continues to take the Dexedrine as prescribed. Patient reported that he has been taking several over-the-counter vitamins to help with his memory. He is currently taking Beet root, citrulline , Alpha GPC, Huperizine to boost his memory. He is taking the hyper megadoses of the same medications. He stated that he thinks that his memory is not as sharp. He appeared hyper-during the interview as usual. He currently denied having any suicidal ideations or plans. He denied using any drugs or alcohol.     Chief Complaint:  Chief Complaint    Follow-up; Medication Refill      Medication refill Visit Diagnosis:     ICD-9-CM ICD-10-CM   1. 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 Angina at rest [facility])   . Anxiety   . Attention-deficit/hyperactivity disorder   . Bipolar disorder (HCC)   . Depression   . History of tobacco abuse   . Hypertension   . Hypertrophic cardiomyopathy (HCC)    s/p myomectomy  . Peripheral neuropathy (HCC)   . PTSD (post-traumatic stress disorder)     Past Surgical History:  Procedure Laterality Date  . CARDIAC CATHETERIZATION     11/14/2010  . CORONARY ARTERY BYPASS GRAFT     for myomectomy for hypertrophic cardiomyopathy   Family History:  Family History  Problem Relation Age of Onset  . Cancer - Lung Mother   . Skin cancer  Mother   . Alcohol abuse Mother   . Drug abuse Mother   . Stroke Father   . Hypertension Father   . Alcohol abuse Father   . Drug abuse Father   . Drug abuse Sister    Social History:  Social History   Social History  . Marital status: Single    Spouse name: N/A  . Number of children: N/A  . Years of education: N/A   Social History Main Topics  . Smoking status: Current Every Day Smoker    Packs/day: 1.50    Years: 15.00    Types: Cigarettes    Start date: 12/31/1988  . Smokeless tobacco: Never Used  . Alcohol use No  . Drug use: No  . Sexual activity: No   Other Topics Concern  . Not on file   Social History Narrative  . No narrative on file   Additional History:  He currently lives with his father.  Assessment:   Musculoskeletal:   Psychiatric Specialty Exam: Anxiety     Medication Refill     ROS  There were no vitals taken for this visit.There is no height or weight on file to calculate BMI.  General Appearance: Casual  Eye Contact:  Good  Speech:  Pressured  Volume:  Normal  Mood:  Anxious  Affect:  Inappropriate  Thought Process:  Goal Directed and Intact  Orientation:  Full (Time, Place, and Person)  Thought Content:  WDL  Suicidal Thoughts:  No  Homicidal Thoughts:  No  Memory:  Immediate;   Fair  Judgement:  Fair  Insight:  Fair  Psychomotor Activity:  Normal  Concentration:  Fair  Recall:  [employer] of Knowledge: Fair  Language: Fair  Akathisia:  No  Handed:  Right  AIMS (if indicated):    Assets:  Communication Skills Desire for Improvement Physical Health Social Support  ADL's:  Intact  Cognition: WNL  Sleep:     Current Medications: Current Outpatient Prescriptions  Medication Sig Dispense Refill  . Aspirin-Caffeine (BC FAST PAIN RELIEF PO) Take by mouth as needed.    [name] dextroamphetamine (DEXTROSTAT) 10 MG tablet Take 1 tablet (10 mg total) by mouth 2 (two) times daily. 60 tablet 0  . dextroamphetamine (DEXTROSTAT) 10  MG tablet Take 1 tablet (10 mg total) by mouth 2 (two) times daily. To be filled 01/19/16 60 tablet 0  . dextroamphetamine (DEXTROSTAT) 10 MG tablet Take 1 tablet (10 mg total) by mouth 2 (two) times daily. To be filled 02/18/16 60 tablet 0   No current facility-administered medications for this visit.     Medical Decision Making:  Review of Psycho-Social Stressors (1)  Treatment Plan Summary:Medication management   ADD Patient will continue on Dexedrine 10 mg by mouth twice a day. Prescription given for the next 2 month.     Follow-up 2 months or earlier depending on his symptoms   More than 50% of the time spent in psychoeducation, counseling and coordination of care.     This note was generated in part or whole with voice recognition software. Voice regonition is usually quite accurate but there are transcription errors that can and very often do occur. I apologize for any typographical errors that were not detected and corrected.    Brandy Hale, MD  01/14/2016, 3:13 PM

## 2016-03-16 ENCOUNTER — Encounter: Payer: Self-pay | Admitting: Psychiatry

## 2016-03-16 ENCOUNTER — Ambulatory Visit (INDEPENDENT_AMBULATORY_CARE_PROVIDER_SITE_OTHER): Payer: Self-pay | Admitting: Psychiatry

## 2016-03-16 VITALS — BP 155/98 | HR 90 | Temp 97.9°F | Wt 205.4 lb

## 2016-03-16 DIAGNOSIS — G9009 Other idiopathic peripheral autonomic neuropathy: Secondary | ICD-10-CM

## 2016-03-16 DIAGNOSIS — F900 Attention-deficit hyperactivity disorder, predominantly inattentive type: Secondary | ICD-10-CM

## 2016-03-16 DIAGNOSIS — F3111 Bipolar disorder, current episode manic without psychotic features, mild: Secondary | ICD-10-CM

## 2016-03-16 MED ORDER — DEXTROAMPHETAMINE SULFATE 10 MG PO TABS: 10.0000 mg | ORAL_TABLET | Freq: Two times a day (BID) | ORAL | 0 refills | Status: DC

## 2016-03-16 NOTE — Progress Notes (Signed)
BH MD/PA/NP OP Progress Note  03/16/2016 2:23 PM Jake GeraldsMark Wenk  MRN:  [id number]  Subjective:  Patient is a 45 year old male with history of ADD and mood symptoms who presented for the follow-up appointment. He continues to show hyperactivity during the interview. He reported that [place]. He reported that he spends time at home. He reported that he has tried several psycho topic medications in the past and is not improving. He does not want to change any medications at this time. He only takes Dexedrine. We discussed about several medication options at length but he is not receptive to medication changes. He appeared calm and collected during the interview. He denied having any perceptual disturbances he denied having any suicidal homicidal ideations or plans at this time.       Chief Complaint:  Chief Complaint    Follow-up; Medication Refill      Medication refill Visit Diagnosis:   No diagnosis found.  Past Medical History:  Past Medical History:  Diagnosis Date  . Angina at rest [facility](HCC)   . Anxiety   . Attention-deficit/hyperactivity disorder   . Bipolar disorder (HCC)   . Depression   . History of tobacco abuse   . Hypertension   . Hypertrophic cardiomyopathy (HCC)    s/p myomectomy  . Peripheral neuropathy (HCC)   . PTSD (post-traumatic stress disorder)     Past Surgical History:  Procedure Laterality Date  . CARDIAC CATHETERIZATION     11/14/2010  . CORONARY ARTERY BYPASS GRAFT     for myomectomy for hypertrophic cardiomyopathy   Family History:  Family History  Problem Relation Age of Onset  . Cancer - Lung Mother   . Skin cancer Mother   . Alcohol abuse Mother   . Drug abuse Mother   . Stroke Father   . Hypertension Father   . Alcohol abuse Father   . Drug abuse Father   . Drug abuse Sister    Social History:  Social History   Social History  . Marital status: Single    Spouse name: N/A  . Number of children: N/A  . Years of  education: N/A   Social History Main Topics  . Smoking status: Current Every Day Smoker    Packs/day: 1.50    Years: 15.00    Types: Cigarettes    Start date: 12/31/1988  . Smokeless tobacco: Never Used  . Alcohol use No  . Drug use: No  . Sexual activity: No   Other Topics Concern  . None   Social History Narrative  . None   Additional History:  He currently lives with his father.  Assessment:   Musculoskeletal:   Psychiatric Specialty Exam: Medication Refill   Anxiety       ROS  There were no vitals taken for this visit.There is no height or weight on file to calculate BMI.  General Appearance: Casual  Eye Contact:  Good  Speech:  Pressured  Volume:  Normal  Mood:  Anxious  Affect:  Inappropriate  Thought Process:  Goal Directed and Intact  Orientation:  Full (Time, Place, and Person)  Thought Content:  WDL  Suicidal Thoughts:  No  Homicidal Thoughts:  No  Memory:  Immediate;   Fair  Judgement:  Fair  Insight:  Fair  Psychomotor Activity:  Normal  Concentration:  Fair  Recall:  [employer]  [employer] of Knowledge: Fair  Language: Fair  Akathisia:  No  Handed:  Right  AIMS (if  indicated):    Assets:  Communication Skills Desire for Improvement Physical Health Social Support  ADL's:  Intact  Cognition: WNL  Sleep:     Current Medications: Current Outpatient Prescriptions  Medication Sig Dispense Refill  . Aspirin-Caffeine (BC FAST PAIN RELIEF PO) Take by mouth as needed.    [name] dextroamphetamine (DEXTROSTAT) 10 MG tablet Take 1 tablet (10 mg total) by mouth 2 (two) times daily. 60 tablet 0  . dextroamphetamine (DEXTROSTAT) 10 MG tablet Take 1 tablet (10 mg total) by mouth 2 (two) times daily. To be filled 01/19/16 60 tablet 0  . dextroamphetamine (DEXTROSTAT) 10 MG tablet Take 1 tablet (10 mg total) by mouth 2 (two) times daily. To be filled 02/18/16 60 tablet 0   No current facility-administered medications for this visit.     Medical Decision Making:   Review of Psycho-Social Stressors (1)  Treatment Plan Summary:Medication management   ADD Patient will continue on Dexedrine 10 mg by mouth twice a day. Prescription given for the next 2 month.   Discussed with patient about my departure from this practice in the next 2 months. He reported that he will come back for a follow-up appointment before that to get the supply for the next 2 months.    Follow-up in 2 months     More than 50% of the time spent in psychoeducation, counseling and coordination of care.     This note was generated in part or whole with voice recognition software. Voice regonition is usually quite accurate but there are transcription errors that can and very often do occur. I apologize for any typographical errors that were not detected and corrected.    Brandy Hale, MD  03/16/2016, 2:23 PM

## 2016-05-05 ENCOUNTER — Ambulatory Visit (INDEPENDENT_AMBULATORY_CARE_PROVIDER_SITE_OTHER): Payer: Self-pay | Admitting: Psychiatry

## 2016-05-05 ENCOUNTER — Encounter: Payer: Self-pay | Admitting: Psychiatry

## 2016-05-05 VITALS — BP 146/88 | HR 104 | Ht 71.0 in | Wt 205.0 lb

## 2016-05-05 DIAGNOSIS — G9009 Other idiopathic peripheral autonomic neuropathy: Secondary | ICD-10-CM

## 2016-05-05 DIAGNOSIS — F900 Attention-deficit hyperactivity disorder, predominantly inattentive type: Secondary | ICD-10-CM

## 2016-05-05 MED ORDER — DEXTROAMPHETAMINE SULFATE 10 MG PO TABS: 10.0000 mg | ORAL_TABLET | Freq: Two times a day (BID) | ORAL | 0 refills | Status: DC

## 2016-05-05 MED ORDER — QUETIAPINE FUMARATE 25 MG PO TABS
25.0000 mg | ORAL_TABLET | Freq: Every day | ORAL | 1 refills | Status: DC
Start: 2016-05-05 — End: 2016-06-03

## 2016-05-05 NOTE — Progress Notes (Signed)
BH MD/PA/NP OP Progress Note  05/05/2016 2:48 PM Jake GeraldsMark Salminen  MRN:  [id number]  Subjective:  Patient is a 45 year old male with history of ADD and mood symptoms who presented for the follow-up appointment. He continues to show hyperactivity during the interview. He reported that he is feeling confused most of the time. He appeared anxious and reported that he feels that he is having hallucinations as well. He reported that he has tried stopping the Dexedrine but it was not helpful. He reported that he wants to have the medications will help with the hallucinations. He stated that he sleeps around 4 AM. He feels that he is not able to complete his tasks on a regular basis. Patient currently denied having any suicidal ideations or plans. We discussed about his medications as her blood pressure is also elevated at this time.   He agreed to a trial of Seroquel at this time.        Chief Complaint:  Chief Complaint    Follow-up      Medication refill Visit Diagnosis:   No diagnosis found.  Past Medical History:  Past Medical History:  Diagnosis Date  . Angina at rest [facility](HCC)   . Anxiety   . Attention-deficit/hyperactivity disorder   . Bipolar disorder (HCC)   . Depression   . History of tobacco abuse   . Hypertension   . Hypertrophic cardiomyopathy (HCC)    s/p myomectomy  . Peripheral neuropathy (HCC)   . PTSD (post-traumatic stress disorder)     Past Surgical History:  Procedure Laterality Date  . CARDIAC CATHETERIZATION     11/14/2010  . CORONARY ARTERY BYPASS GRAFT     for myomectomy for hypertrophic cardiomyopathy   Family History:  Family History  Problem Relation Age of Onset  . Cancer - Lung Mother   . Skin cancer Mother   . Alcohol abuse Mother   . Drug abuse Mother   . Stroke Father   . Hypertension Father   . Alcohol abuse Father   . Drug abuse Father   . Drug abuse Sister    Social History:  Social History   Social History  . Marital status: Single     Spouse name: N/A  . Number of children: N/A  . Years of education: N/A   Social History Main Topics  . Smoking status: Current Every Day Smoker    Packs/day: 1.00    Years: 15.00    Types: Cigarettes    Start date: 12/31/1988  . Smokeless tobacco: Never Used  . Alcohol use No  . Drug use: No  . Sexual activity: Yes    Partners: Female    Birth control/ protection: None   Other Topics Concern  . None   Social History Narrative  . None   Additional History:  He currently lives with his father.  Assessment:   Musculoskeletal:   Psychiatric Specialty Exam: Medication Refill   Anxiety       ROS  Blood pressure (!) 146/88, pulse (!) 104, height 5\' 11"  (1.803 m), weight 205 lb (93 kg).Body mass index is 28.59 kg/m.  General Appearance: Casual  Eye Contact:  Good  Speech:  Pressured  Volume:  Normal  Mood:  Anxious  Affect:  Inappropriate  Thought Process:  Goal Directed and Intact  Orientation:  Full (Time, Place, and Person)  Thought Content:  WDL  Suicidal Thoughts:  No  Homicidal Thoughts:  No  Memory:  Immediate;   Fair  Judgement:  Fair  Insight:  Fair  Psychomotor Activity:  Normal  Concentration:  Fair  Recall:  [employer]  Fund of Knowledge: Fair  Language: Fair  Akathisia:  No  Handed:  Right  AIMS (if indicated):    Assets:  Communication Skills Desire for Improvement Physical Health Social Support  ADL's:  Intact  Cognition: WNL  Sleep:     Current Medications: Current Outpatient Prescriptions  Medication Sig Dispense Refill  . Aspirin-Caffeine (BC FAST PAIN RELIEF PO) Take by mouth as needed.    [name]. dextroamphetamine (DEXTROSTAT) 10 MG tablet Take 1 tablet (10 mg total) by mouth 2 (two) times daily. To be filled 03/17/16 60 tablet 0  . dextroamphetamine (DEXTROSTAT) 10 MG tablet Take 1 tablet (10 mg total) by mouth 2 (two) times daily. To be filled 04/18/16 60 tablet 0   No current facility-administered medications for this visit.      Medical Decision Making:  Review of Psycho-Social Stressors (1)  Treatment Plan Summary:Medication management   ADD Patient will continue on Dexedrine 10 mg by mouth twice a day. Prescription given for the next 1 month.   I will also start him on Seroquel 25 mg and advised him to take 1-2 pills. He will only be given 30 pills at this time.  He was follow-up in one month or earlier depending on his symptoms.   Follow-up in 1  months     More than 50% of the time spent in psychoeducation, counseling and coordination of care.     This note was generated in part or whole with voice recognition software. Voice regonition is usually quite accurate but there are transcription errors that can and very often do occur. I apologize for any typographical errors that were not detected and corrected.    Brandy HaleUzma Mkenzie Dotts, MD  05/05/2016, 2:48 PM

## 2016-05-06 ENCOUNTER — Ambulatory Visit: Payer: Medicaid Other | Admitting: Psychiatry

## 2016-06-03 ENCOUNTER — Ambulatory Visit (INDEPENDENT_AMBULATORY_CARE_PROVIDER_SITE_OTHER): Payer: Self-pay | Admitting: Psychiatry

## 2016-06-03 ENCOUNTER — Encounter: Payer: Self-pay | Admitting: Psychiatry

## 2016-06-03 VITALS — BP 171/109 | HR 98 | Temp 97.5°F | Wt 208.6 lb

## 2016-06-03 DIAGNOSIS — F3111 Bipolar disorder, current episode manic without psychotic features, mild: Secondary | ICD-10-CM

## 2016-06-03 DIAGNOSIS — F900 Attention-deficit hyperactivity disorder, predominantly inattentive type: Secondary | ICD-10-CM

## 2016-06-03 MED ORDER — DEXTROAMPHETAMINE SULFATE 10 MG PO TABS: 10.0000 mg | ORAL_TABLET | Freq: Two times a day (BID) | ORAL | 0 refills | Status: DC

## 2016-06-03 MED ORDER — BREXPIPRAZOLE 0.5 MG PO TABS: 0.5000 mg | ORAL_TABLET | ORAL | 0 refills | Status: DC

## 2016-06-03 NOTE — Progress Notes (Signed)
BH MD/PA/NP OP Progress Note  06/03/2016 2:49 PM Jake GeraldsMark Elliott  MRN:  [id number]  Subjective:  Patient is a 45 year-old male with history of ADD and mood symptoms who presented for the follow-up appointment. He brought an evaluation done in 2007 at [facility]. He reported that patient has history of major depression anxiety and bipolar disorder. Patient continues to show hyperactivity. He reported that he took the Seroquel only couple of times and was making him tired and he was sleeping longer hours. He reported that he continues to have mood symptoms. He reported that he wants to try some other medication as he does not like the Seroquel. He reported that he is still taking the Dexedrine. His blood pressure is significantly elevated. He reported that he has a prescription of verapamil but he is taking it only on a when necessary basis. We discussed about his blood pressure and he acknowledged. He is open to discussion and wants to try another medication for his mood symptoms.   Patient currently denied having any suicidal ideations or plans. He denied having any perceptual disturbances.    Chief Complaint:  Chief Complaint    Follow-up; Medication Refill      Medication refill Visit Diagnosis:     ICD-9-CM ICD-10-CM   1. Attention deficit hyperactivity disorder (ADHD), predominantly inattentive type 314.00 F90.0   2. Bipolar affective disorder, currently manic, mild (HCC) 296.41 F31.11     Past Medical History:  Past Medical History:  Diagnosis Date  . Angina at rest [facility](HCC)   . Anxiety   . Attention-deficit/hyperactivity disorder   . Bipolar disorder (HCC)   . Depression   . History of tobacco abuse   . Hypertension   . Hypertrophic cardiomyopathy (HCC)    s/p myomectomy  . Peripheral neuropathy (HCC)   . PTSD (post-traumatic stress disorder)     Past Surgical History:  Procedure Laterality Date  . CARDIAC CATHETERIZATION     11/14/2010  . CORONARY ARTERY BYPASS GRAFT      for myomectomy for hypertrophic cardiomyopathy   Family History:  Family History  Problem Relation Age of Onset  . Cancer - Lung Mother   . Skin cancer Mother   . Alcohol abuse Mother   . Drug abuse Mother   . Stroke Father   . Hypertension Father   . Alcohol abuse Father   . Drug abuse Father   . Drug abuse Sister    Social History:  Social History   Social History  . Marital status: Single    Spouse name: N/A  . Number of children: N/A  . Years of education: N/A   Social History Main Topics  . Smoking status: Current Every Day Smoker    Packs/day: 1.00    Years: 15.00    Types: Cigarettes    Start date: 12/31/1988  . Smokeless tobacco: Never Used  . Alcohol use No  . Drug use: No  . Sexual activity: Yes    Partners: Female    Birth control/ protection: None   Other Topics Concern  . None   Social History Narrative  . None   Additional History:  He currently lives with his father.  Assessment:   Musculoskeletal:   Psychiatric Specialty Exam: Medication Refill   Anxiety       ROS  Blood pressure (!) 171/109, pulse 98, temperature 97.5 F (36.4 C), temperature source Oral, weight 208 lb 9.6 oz (94.6 kg).Body mass index is 29.09 kg/m.  General Appearance: Casual  Eye Contact:  Good  Speech:  Pressured  Volume:  Normal  Mood:  Anxious  Affect:  Inappropriate  Thought Process:  Goal Directed and Intact  Orientation:  Full (Time, Place, and Person)  Thought Content:  WDL  Suicidal Thoughts:  No  Homicidal Thoughts:  No  Memory:  Immediate;   Fair  Judgement:  Fair  Insight:  Fair  Psychomotor Activity:  Normal  Concentration:  Fair  Recall:  [employer]  Fund of Knowledge: Fair  Language: Fair  Akathisia:  No  Handed:  Right  AIMS (if indicated):    Assets:  Communication Skills Desire for Improvement Physical Health Social Support  ADL's:  Intact  Cognition: WNL  Sleep:     Current Medications: Current Outpatient Prescriptions   Medication Sig Dispense Refill  . Aspirin-Caffeine (BC FAST PAIN RELIEF PO) Take by mouth as needed.    . Brexpiprazole (REXULTI) 0.5 MG TABS Take 1 tablet (0.5 mg total) by mouth every morning. 28 tablet 0  . dextroamphetamine (DEXTROSTAT) 10 MG tablet Take 1 tablet (10 mg total) by mouth 2 (two) times daily. 60 tablet 0  . dextroamphetamine (DEXTROSTAT) 10 MG tablet Take 1 tablet (10 mg total) by mouth 2 (two) times daily. 60 tablet 0   No current facility-administered medications for this visit.     Medical Decision Making:  Review of Psycho-Social Stressors (1)  Treatment Plan Summary:Medication management   ADD Patient will continue on Dexedrine 10 mg by mouth twice a day. Prescription given for the next 2  month.   I will also start him onRexulti 0.5 mg  and advised him to take 1 pill daily. He was given samples.[name]. He was advised to call for the sample or the prescription if he started getting better on the medication. He agreed with the plan.  He was follow-up in 2 month or earlier depending on his symptoms.   More than 50% of the time spent in psychoeducation, counseling and coordination of care.     This note was generated in part or whole with voice recognition software. Voice regonition is usually quite accurate but there are transcription errors that can and very often do occur. I apologize for any typographical errors that were not detected and corrected.    Brandy HaleUzma Matraca Hunkins, MD  06/03/2016, 2:49 PM

## 2016-06-16 ENCOUNTER — Other Ambulatory Visit: Payer: Self-pay | Admitting: Psychiatry

## 2016-06-16 MED ORDER — DEXTROAMPHETAMINE SULFATE 10 MG PO TABS: 10.0000 mg | ORAL_TABLET | Freq: Two times a day (BID) | ORAL | 0 refills | Status: DC

## 2016-06-16 NOTE — Progress Notes (Signed)
refill 

## 2016-06-19 ENCOUNTER — Encounter: Payer: Self-pay | Admitting: Cardiovascular Disease

## 2016-06-19 NOTE — Telephone Encounter (Signed)
This encounter was created in error - please disregard.

## 2016-07-02 ENCOUNTER — Ambulatory Visit: Payer: Self-pay | Admitting: Cardiovascular Disease

## 2016-07-03 ENCOUNTER — Encounter: Payer: Self-pay | Admitting: *Deleted

## 2016-07-13 ENCOUNTER — Telehealth: Payer: Self-pay | Admitting: Cardiovascular Disease

## 2016-07-13 NOTE — Telephone Encounter (Signed)
Made attempt to call cell and home phone with no answer/no vm. Pt has appt with Mariah MillingGollan tomorrow and no PCP on file. Pt hasn't been seen since 04/2015 and wanted to know if he has been under PCP care since then to request office notes.

## 2016-07-14 ENCOUNTER — Encounter: Payer: Self-pay | Admitting: Cardiovascular Disease

## 2016-07-14 ENCOUNTER — Ambulatory Visit (INDEPENDENT_AMBULATORY_CARE_PROVIDER_SITE_OTHER): Payer: Self-pay | Admitting: Cardiovascular Disease

## 2016-07-14 VITALS — BP 148/100 | HR 102 | Ht 71.0 in | Wt 208.2 lb

## 2016-07-14 DIAGNOSIS — E785 Hyperlipidemia, unspecified: Secondary | ICD-10-CM

## 2016-07-14 DIAGNOSIS — R000 Tachycardia, unspecified: Secondary | ICD-10-CM

## 2016-07-14 DIAGNOSIS — I421 Obstructive hypertrophic cardiomyopathy: Secondary | ICD-10-CM

## 2016-07-14 DIAGNOSIS — G894 Chronic pain syndrome: Secondary | ICD-10-CM | POA: Insufficient documentation

## 2016-07-14 DIAGNOSIS — I10 Essential (primary) hypertension: Secondary | ICD-10-CM

## 2016-07-14 DIAGNOSIS — R51 Headache: Secondary | ICD-10-CM

## 2016-07-14 DIAGNOSIS — R519 Headache, unspecified: Secondary | ICD-10-CM

## 2016-07-14 DIAGNOSIS — R5383 Other fatigue: Secondary | ICD-10-CM

## 2016-07-14 MED ORDER — VERAPAMIL HCL 120 MG PO TABS: 120.0000 mg | ORAL_TABLET | Freq: Three times a day (TID) | ORAL | 11 refills | Status: DC | PRN

## 2016-07-14 NOTE — Progress Notes (Signed)
Cardiology Office Note  Date:  07/14/2016   ID:  Jake Elliott, DOB 08-18-1970, MRN [id number]  PCP:  No PCP Per Patient   Chief Complaint  Patient presents with  . other    C/o chest discomfort and elevated BP. Pt hx of Bystolic gives him headache. Meds reviewed verbally with pt.     HPI:  Jake Elliott is a very pleasant 46 year old gentleman who has a history of HOCM, myomectomy in 2008 , Follow-up echocardiogram with moderate LVH, cardiac catheterization June 2012 showing no significant coronary disease, history of sleep apnea, unable to tolerate CPAP, who presents for routine followup of his tachycardia and hypertension On a previous visit he reported having a history of nonsustained VT. Prior history of heavy opiate use for chronic pain  On his prior clinic visit in November 2016, he was started on verapamil 120 mg 3 times a day and bystolic (samples) for tachycardia and blood pressure.  Previously had trouble on select beta blockers  He ran out of this medication and currently does not take anything for heart rate or blood pressure. Reports having blood pressure readings that have been moderately elevated. Reports that he feels well when his heart rate is around 100 Medications give him headaches including many of the beta blockers Reports he is able to tolerate verapamil  Reports he is taking medication for ADHD   he does not have insurance, Does not want to go to medical management, does not need help from Jake Elliott or Jake Elliott clinic  Biggest complaint is related to his psych issues He does have a therapist Problems with anxiety, tightness in his back, palpitations at night that shake his body Significant fatigue, headaches  He takes 15 ibuprofen and numerous aspirins per day for chronic pain Reports having a problem with his thyroid and his prolactin in the past  EKG on today's visit shows sinus tachycardia with rate 10 2 bpm, LVH, left anterior fascicular block  Other  past medical history reviewed  Previously was on calcium channel blockers and beta blockers. 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   history of anxiety depression. He has had a long hospital course requiring assistance from psychiatry.   chronic pain syndrome.   Prior Holter monitor in 2013 showing normal sinus rhythm with APCs and PVCs   PMH:   has a past medical history of Angina at rest Jake Elliott); Anxiety; Attention-deficit/hyperactivity disorder; Bipolar disorder (HCC); Depression; History of tobacco abuse; Hypertension; Hypertrophic cardiomyopathy (HCC); Peripheral neuropathy (HCC); and PTSD (post-traumatic stress disorder).  PSH:    Past Surgical History:  Procedure Laterality Date  . CARDIAC CATHETERIZATION     11/14/2010  . CORONARY ARTERY BYPASS GRAFT     for myomectomy for hypertrophic cardiomyopathy    Current Outpatient Prescriptions  Medication Sig Dispense Refill  . Aspirin-Caffeine (BC FAST PAIN RELIEF PO) Take by mouth as needed.    [name] dextroamphetamine (DEXTROSTAT) 10 MG tablet Take 1 tablet (10 mg total) by mouth 2 (two) times daily. 60 tablet 0  . verapamil (CALAN) 120 MG tablet Take 1 tablet (120 mg total) by mouth 3 (three) times daily as needed. 90 tablet 11   No current facility-administered medications for this visit.      Allergies:   Acetaminophen; Bystolic [nebivolol hcl]; Codeine; and Morphine and related   Social History:  The patient  reports that he has been smoking Cigarettes.  He started smoking about 27 years ago. He has a 15.00  pack-year smoking history. He has never used smokeless tobacco. He reports that he does not drink alcohol or use drugs.   Family History:   family history includes Alcohol abuse in his father and mother; Cancer - Lung in his mother; Drug abuse in his father, mother, and sister; Hypertension in his father; Skin cancer in his mother; Stroke in his father.    Review of Systems: Review of  Systems  Constitutional: Negative.   Respiratory: Negative.   Cardiovascular: Positive for palpitations.       Tachycardia  Gastrointestinal: Negative.   Musculoskeletal: Positive for back pain.  Neurological: Negative.   Psychiatric/Behavioral: Positive for depression. The patient is nervous/anxious.   All other systems reviewed and are negative.    PHYSICAL EXAM: VS:  BP (!) 148/100 (BP Location: Left Arm, Patient Position: Sitting, Cuff Size: Normal)   Pulse (!) 102   Ht 5\' 11"  (1.803 m)   Wt 208 lb 4 oz (94.5 kg)   BMI 29.04 kg/m  , BMI Body mass index is 29.04 kg/m. GEN: Well nourished, well developed, in no acute distress  HEENT: normal  Neck: no JVD, carotid bruits, or masses Cardiac: RRR; 2/6 SEM LSB, no rubs, or gallops,no edema  Murmur worse with Valsalva Respiratory:  clear to auscultation bilaterally, normal work of breathing Jake: soft, nontender, nondistended, + BS MS: no deformity or atrophy  Skin: warm and dry, no rash Neuro:  Strength and sensation are intact Psych: euthymic mood, full affect    Recent Labs: No results found for requested labs within last 8760 hours.    Lipid Panel No results found for: CHOL, HDL, LDLCALC, TRIG    Wt Readings from Last 3 Encounters:  07/14/16 208 lb 4 oz (94.5 kg)  05/01/15 206 lb 4 oz (93.6 kg)  04/08/15 208 lb (94.3 kg)       ASSESSMENT AND PLAN:  Frequent headaches - Plan: CBC with Differential/Platelet, Lipid Profile, TSH, Vitamin D (25 hydroxy), Basic Metabolic Panel (BMET), Prolactin Etiology unclear, lab work has been ordered at his request Takes large amounts of NSAIDs for his symptoms  Hyperlipidemia, unspecified hyperlipidemia type - Plan: CBC with Differential/Platelet, Lipid Profile, TSH, Vitamin D (25 hydroxy), Basic Metabolic Panel (BMET), Prolactin Fasting lab work scheduled for tomorrow   Fatigue, unspecified type - Plan: CBC with Differential/Platelet, Lipid Profile, TSH, Vitamin D (25  hydroxy), Basic Metabolic Panel (BMET), Prolactin Lab work scheduled as above  Hypertrophic obstructive cardiomyopathy (HCC) - Plan: CBC with Differential/Platelet, Lipid Profile, TSH, Vitamin D (25 hydroxy), Basic Metabolic Panel (BMET), Prolactin No additional testing at this time Symptoms are relatively stable, discussed previous myomectomy with him  Essential hypertension Blood pressure elevated, he does not like to take medications on a regular basis but will take for upper mill as needed for symptoms  Tachycardia Verapamil has been renewed,120 mg 3 times a day He does not want beta blockers at this time  Chronic pain syndrome  long discussion concerning his previous history Was previously on high-dose narcotics Now takes large amounts of NSAIDs including aspirin, Aleve   Total encounter time more than 25 minutes  Greater than 50% was spent in counseling and coordination of care with the patient   Disposition:   F/U  12 months   Orders Placed This Encounter  Procedures  . CBC with Differential/Platelet  . Lipid Profile  . TSH  . Vitamin D (25 hydroxy)  . Basic Metabolic Panel (BMET)  . Prolactin     Signed, Tim  Mariah Milling, M.D., Ph.D. 07/14/2016  [facility] [place], [place] [phone]

## 2016-07-14 NOTE — Patient Instructions (Addendum)
Medication Instructions:   Please take verapamil as needed for tachycardia, high blood pressure  Labwork:  Labs:  CBC, BMET, TSH, Vit D, prolactin, lipid panel  Please go the [facility] at your convenience for lab draw No appointment is necessary Nothing except water after midnight the morning of your labs  Testing/Procedures:  No further testing at this time   I recommend watching educational videos on topics of interest to you at:       [email or web address]  Enter code: [email or web address]    Follow-Up: It was a pleasure seeing you in the office today. Please call us if you have new issues that need to be addressed before your next appt.  [phone]  Your physician wants you to follow-up in: 6 months.  You will receive a reminder letter in the mail two months in advance. If you don't receive a letter, please call our office to schedule the follow-up appointment.  If you need a refill on your cardiac medications before your next appointment, please call your pharmacy.

## 2016-07-15 ENCOUNTER — Other Ambulatory Visit
Admission: RE | Admit: 2016-07-15 | Discharge: 2016-07-15 | Disposition: A | Discharge status: Home / Self Care | Payer: Self-pay | Source: Ambulatory Visit | Attending: Cardiovascular Disease | Admitting: Cardiovascular Disease

## 2016-07-15 DIAGNOSIS — R5383 Other fatigue: Secondary | ICD-10-CM | POA: Insufficient documentation

## 2016-07-15 DIAGNOSIS — I421 Obstructive hypertrophic cardiomyopathy: Secondary | ICD-10-CM | POA: Insufficient documentation

## 2016-07-15 DIAGNOSIS — E785 Hyperlipidemia, unspecified: Secondary | ICD-10-CM | POA: Insufficient documentation

## 2016-07-15 DIAGNOSIS — R519 Headache, unspecified: Secondary | ICD-10-CM

## 2016-07-15 DIAGNOSIS — R51 Headache: Secondary | ICD-10-CM | POA: Insufficient documentation

## 2016-07-15 LAB — LIPID PANEL
CHOLESTEROL: 261 mg/dL — AB (ref 0–200)
HDL: 53 mg/dL (ref >40)
LDL Cholesterol: 175 mg/dL — ABNORMAL HIGH (ref 0–99)
Total CHOL/HDL Ratio: 4.9 RATIO
Triglycerides: 165 mg/dL — ABNORMAL HIGH (ref <150)
VLDL: 33 mg/dL (ref 0–40)

## 2016-07-15 LAB — BASIC METABOLIC PANEL
ANION GAP: 9 (ref 5–15)
BUN: 14 mg/dL (ref 6–20)
CHLORIDE: 101 mmol/L (ref 101–111)
CO2: 26 mmol/L (ref 22–32)
Calcium: 9.3 mg/dL (ref 8.9–10.3)
Creatinine, Ser: 1.22 mg/dL (ref 0.61–1.24)
GFR calc non Af Amer: >60 mL/min (ref >60)
GLUCOSE: 94 mg/dL (ref 65–99)
Potassium: 4.1 mmol/L (ref 3.5–5.1)
Sodium: 136 mmol/L (ref 135–145)

## 2016-07-15 LAB — CBC WITH DIFFERENTIAL/PLATELET
Basophils Absolute: 0.1 10*3/uL (ref 0–0.1)
Basophils Relative: 1 %
EOS ABS: 0.1 10*3/uL (ref 0–0.7)
Eosinophils Relative: 2 %
HCT: 48.6 % (ref 40.0–52.0)
Hemoglobin: 16.8 g/dL (ref 13.0–18.0)
LYMPHS ABS: 1.9 10*3/uL (ref 1.0–3.6)
Lymphocytes Relative: 23 %
MCH: 29.8 pg (ref 26.0–34.0)
MCHC: 34.6 g/dL (ref 32.0–36.0)
MCV: 86.3 fL (ref 80.0–100.0)
MONO ABS: 0.7 10*3/uL (ref 0.2–1.0)
MONOS PCT: 9 %
Neutro Abs: 5.3 10*3/uL (ref 1.4–6.5)
Neutrophils Relative %: 65 %
PLATELETS: 187 10*3/uL (ref 150–440)
RBC: 5.64 MIL/uL (ref 4.40–5.90)
RDW: 12.6 % (ref 11.5–14.5)
WBC: 8.1 10*3/uL (ref 3.8–10.6)

## 2016-07-15 LAB — TSH: TSH: 3.553 u[IU]/mL (ref 0.350–4.500)

## 2016-07-16 ENCOUNTER — Other Ambulatory Visit: Payer: Self-pay

## 2016-07-16 LAB — PROLACTIN: Prolactin: 6.1 ng/mL (ref 4.0–15.2)

## 2016-07-16 LAB — VITAMIN D 25 HYDROXY (VIT D DEFICIENCY, FRACTURES): Vit D, 25-Hydroxy: 47.7 ng/mL (ref 30.0–100.0)

## 2016-07-16 MED ORDER — ATORVASTATIN CALCIUM 20 MG PO TABS: 20.0000 mg | ORAL_TABLET | Freq: Every day | ORAL | 6 refills | Status: DC

## 2016-07-17 NOTE — Addendum Note (Signed)
Addended by: Kendrick FriesLOPEZ, MARINA C on: 07/17/2016 03:54 PM   Modules accepted: Orders

## 2016-08-05 ENCOUNTER — Other Ambulatory Visit: Payer: Self-pay

## 2016-08-05 ENCOUNTER — Ambulatory Visit: Payer: Medicaid Other | Admitting: Psychiatry

## 2016-08-05 NOTE — Telephone Encounter (Signed)
needs refill had to r/s because Dr Garnetta BuddyFaheem out of office

## 2016-08-13 ENCOUNTER — Other Ambulatory Visit (HOSPITAL_COMMUNITY): Payer: Self-pay | Admitting: Psychiatry

## 2016-08-13 MED ORDER — DEXTROAMPHETAMINE SULFATE 10 MG PO TABS: 10.0000 mg | ORAL_TABLET | Freq: Two times a day (BID) | ORAL | 0 refills | Status: DC

## 2016-08-26 ENCOUNTER — Ambulatory Visit (INDEPENDENT_AMBULATORY_CARE_PROVIDER_SITE_OTHER): Payer: Self-pay | Admitting: Psychiatry

## 2016-08-26 ENCOUNTER — Encounter: Payer: Self-pay | Admitting: Psychiatry

## 2016-08-26 VITALS — BP 161/100 | HR 105 | Temp 97.5°F | Wt 208.4 lb

## 2016-08-26 DIAGNOSIS — F39 Unspecified mood [affective] disorder: Secondary | ICD-10-CM

## 2016-08-26 DIAGNOSIS — F900 Attention-deficit hyperactivity disorder, predominantly inattentive type: Secondary | ICD-10-CM

## 2016-08-26 MED ORDER — DEXTROAMPHETAMINE SULFATE 10 MG PO TABS: 10.0000 mg | ORAL_TABLET | Freq: Two times a day (BID) | ORAL | 0 refills | Status: DC

## 2016-08-26 NOTE — Progress Notes (Signed)
BH MD/PA/NP OP Progress Note  08/26/2016 3:10 PM Jamesetta GeraldsMark Withrow  MRN:  [id number]  Subjective:  Patient is a [age] year-old male with history of ADD and mood symptoms who presented for the follow-up appointment. He reported that he has been feeling horrible since his blood pressure is high. He stated that he continues to have elevated blood pressure and he has been following with a cardiologist downstairs and they have tried him on several medications. He is currently taking verapamil which is not helping him. She stated that he Rexulti for almost 3 weeks but it did not help him. He stopped the medication. He he reported that he has hypertension high cholesterolemia and his thyroid level is not under control but he does not have a primary care physician. He reported that he is only taking Adderall as prescribed as he wants to have high energy. We discussed about his medical conditions at length. He stated that he will go to his cardiologist again and will try to find endocrinologist. We discussed about the risk factors as stated with his hypertension and he demonstrated understanding. Patient reported that he is going to [place] next week. He denied having any suicidal homicidal ideations or plans. He denied having any perceptual disturbances.      Chief Complaint:  Chief Complaint    Follow-up; Medication Refill      Medication refill Visit Diagnosis:     ICD-9-CM ICD-10-CM   1. Attention deficit hyperactivity disorder (ADHD), predominantly inattentive type 314.00 F90.0   2. Bipolar affective disorder, currently manic, mild (HCC) 296.41 F31.11     Past Medical History:  Past Medical History:  Diagnosis Date  . Angina at rest [facility](HCC)   . Anxiety   . Attention-deficit/hyperactivity disorder   . Bipolar disorder (HCC)   . Depression   . History of tobacco abuse   . Hypertension   . Hypertrophic cardiomyopathy (HCC)    s/p myomectomy  . Peripheral neuropathy (HCC)   . PTSD (post-traumatic  stress disorder)     Past Surgical History:  Procedure Laterality Date  . CARDIAC CATHETERIZATION     11/14/2010  . CORONARY ARTERY BYPASS GRAFT     for myomectomy for hypertrophic cardiomyopathy   Family History:  Family History  Problem Relation Age of Onset  . Cancer - Lung Mother   . Skin cancer Mother   . Alcohol abuse Mother   . Drug abuse Mother   . Stroke Father   . Hypertension Father   . Alcohol abuse Father   . Drug abuse Father   . Drug abuse Sister    Social History:  Social History   Social History  . Marital status: Single    Spouse name: N/A  . Number of children: N/A  . Years of education: N/A   Social History Main Topics  . Smoking status: Current Every Day Smoker    Packs/day: 1.00    Years: 15.00    Types: Cigarettes    Start date: 12/31/1988  . Smokeless tobacco: Never Used  . Alcohol use No  . Drug use: No  . Sexual activity: Yes    Partners: Female    Birth control/ protection: None   Other Topics Concern  . None   Social History Narrative  . None   Additional History:  He currently lives with his father.  Assessment:   Musculoskeletal:   Psychiatric Specialty Exam: Medication Refill   Anxiety       ROS  There were no  vitals taken for this visit.There is no height or weight on file to calculate BMI.  General Appearance: Casual  Eye Contact:  Good  Speech:  Pressured  Volume:  Normal  Mood:  Anxious  Affect:  Inappropriate  Thought Process:  Goal Directed and Intact  Orientation:  Full (Time, Place, and Person)  Thought Content:  WDL  Suicidal Thoughts:  No  Homicidal Thoughts:  No  Memory:  Immediate;   Fair  Judgement:  Fair  Insight:  Fair  Psychomotor Activity:  Normal  Concentration:  Fair  Recall:  [employer] of Knowledge: Fair  Language: Fair  Akathisia:  No  Handed:  Right  AIMS (if indicated):    Assets:  Communication Skills Desire for Improvement Physical Health Social Support  ADL's:  Intact   Cognition: WNL  Sleep:     Current Medications: Current Outpatient Prescriptions  Medication Sig Dispense Refill  . Aspirin-Caffeine (BC FAST PAIN RELIEF PO) Take by mouth as needed.    [name] atorvastatin (LIPITOR) 20 MG tablet Take 1 tablet (20 mg total) by mouth daily. 30 tablet 6  . dextroamphetamine (DEXTROSTAT) 10 MG tablet Take 1 tablet (10 mg total) by mouth 2 (two) times daily. 60 tablet 0  . verapamil (CALAN) 120 MG tablet Take 1 tablet (120 mg total) by mouth 3 (three) times daily as needed. 90 tablet 11   No current facility-administered medications for this visit.     Medical Decision Making:  Review of Psycho-Social Stressors (1)  Treatment Plan Summary:Medication management   ADD Patient will continue on Dexedrine 10 mg by mouth twice a day. Prescription given for the next 2  month.   Advised patient to follow with her primary care physician regarding his hypertension hypercholesterolemia and abnormal thyroid and he demonstrated understanding. He reported that he will follow-up with a cardiologist to have his blood pressure under control. He was follow-up in 2 month or earlier depending on his symptoms.   More than 50% of the time spent in psychoeducation, counseling and coordination of care.     This note was generated in part or whole with voice recognition software. Voice regonition is usually quite accurate but there are transcription errors that can and very often do occur. I apologize for any typographical errors that were not detected and corrected.    Brandy Hale, MD  08/26/2016, 3:10 PM

## 2016-10-26 ENCOUNTER — Ambulatory Visit: Payer: Medicaid Other | Admitting: Psychiatry

## 2016-10-26 ENCOUNTER — Telehealth: Payer: Self-pay

## 2016-10-26 NOTE — Telephone Encounter (Signed)
pt came by to check his blood pressure.  his bp was 163/ 110 pulse 89 pt was advise to go to er or walk in or call pcp.

## 2016-11-11 ENCOUNTER — Ambulatory Visit (INDEPENDENT_AMBULATORY_CARE_PROVIDER_SITE_OTHER): Payer: Self-pay | Admitting: Psychiatry

## 2016-11-11 ENCOUNTER — Encounter: Payer: Self-pay | Admitting: Psychiatry

## 2016-11-11 VITALS — BP 157/88 | HR 108 | Wt 206.4 lb

## 2016-11-11 DIAGNOSIS — F900 Attention-deficit hyperactivity disorder, predominantly inattentive type: Secondary | ICD-10-CM

## 2016-11-11 DIAGNOSIS — F39 Unspecified mood [affective] disorder: Secondary | ICD-10-CM

## 2016-11-11 MED ORDER — DEXTROAMPHETAMINE SULFATE 10 MG PO TABS: 10.0000 mg | ORAL_TABLET | Freq: Two times a day (BID) | ORAL | 0 refills | Status: DC

## 2016-11-11 MED ORDER — GABAPENTIN 100 MG PO CAPS: 100.0000 mg | ORAL_CAPSULE | Freq: Two times a day (BID) | ORAL | 1 refills | Status: DC

## 2016-11-11 NOTE — Progress Notes (Signed)
BH MD/PA/NP OP Progress Note  11/11/2016 2:50 PM Jake GeraldsMark Elliott  MRN:  [id number]  Subjective:  Patient is a 46 year-old male with history of ADD and mood symptoms who presented for the follow-up appointment. He reported that he has been anxious. He reported that he has been compliant with his medications. He reported that he is running out of his Dexedrine. Patient stated that the medications are helping him. He reported that he wants something to help with his anxiety. We discussed about several medications. He has already tried them in the past and they make him slow and does not want to take any of those medications. Patient currently denied having any suicidal ideation or plan. He reported that he has history of tics in the past. He currently denied having any perceptual disturbances he sleeps fine. He agreed to a trial of Neurontin at this time.       Chief Complaint:  Chief Complaint    Follow-up; Medication Refill      Medication refill Visit Diagnosis:     ICD-9-CM ICD-10-CM   1. Attention deficit hyperactivity disorder (ADHD), predominantly inattentive type 314.00 F90.0   2. Episodic mood disorder (HCC) 296.90 F39     Past Medical History:  Past Medical History:  Diagnosis Date  . Angina at rest [facility](HCC)   . Anxiety   . Attention-deficit/hyperactivity disorder   . Bipolar disorder (HCC)   . Depression   . History of tobacco abuse   . Hypertension   . Hypertrophic cardiomyopathy (HCC)    s/p myomectomy  . Peripheral neuropathy   . PTSD (post-traumatic stress disorder)     Past Surgical History:  Procedure Laterality Date  . CARDIAC CATHETERIZATION     11/14/2010  . CORONARY ARTERY BYPASS GRAFT     for myomectomy for hypertrophic cardiomyopathy   Family History:  Family History  Problem Relation Age of Onset  . Cancer - Lung Mother   . Skin cancer Mother   . Alcohol abuse Mother   . Drug abuse Mother   . Stroke Father   . Hypertension Father   . Alcohol abuse  Father   . Drug abuse Father   . Drug abuse Sister    Social History:  Social History   Social History  . Marital status: Single    Spouse name: N/A  . Number of children: N/A  . Years of education: N/A   Social History Main Topics  . Smoking status: Current Every Day Smoker    Packs/day: 1.00    Years: 15.00    Types: Cigarettes    Start date: 12/31/1988  . Smokeless tobacco: Never Used  . Alcohol use No  . Drug use: No  . Sexual activity: Yes    Partners: Female    Birth control/ protection: None   Other Topics Concern  . None   Social History Narrative  . None   Additional History:  He currently lives with his father.  Assessment:   Musculoskeletal:   Psychiatric Specialty Exam: Medication Refill   Anxiety       ROS  Blood pressure (!) 157/88, pulse (!) 108, weight 206 lb 6.4 oz (93.6 kg).Body mass index is 28.79 kg/m.  General Appearance: Casual  Eye Contact:  Good  Speech:  Pressured  Volume:  Normal  Mood:  Anxious  Affect:  Appropriate  Thought Process:  Goal Directed and Intact  Orientation:  Full (Time, Place, and Person)  Thought Content:  WDL  Suicidal Thoughts:  No  Homicidal Thoughts:  No  Memory:  Immediate;   Fair  Judgement:  Fair  Insight:  Fair  Psychomotor Activity:  Normal  Concentration:  Fair  Recall:  [employer] of Knowledge: Fair  Language: Fair  Akathisia:  No  Handed:  Right  AIMS (if indicated):    Assets:  Communication Skills Desire for Improvement Physical Health Social Support  ADL's:  Intact  Cognition: WNL  Sleep:     Current Medications: Current Outpatient Prescriptions  Medication Sig Dispense Refill  . Aspirin-Caffeine (BC FAST PAIN RELIEF PO) Take by mouth as needed.    [name] dextroamphetamine (DEXTROSTAT) 10 MG tablet Take 1 tablet (10 mg total) by mouth 2 (two) times daily. 60 tablet 0  . dextroamphetamine (DEXTROSTAT) 10 MG tablet Take 1 tablet (10 mg total) by mouth 2 (two) times daily. To be  filled 12/13/16 60 tablet 0  . verapamil (CALAN) 120 MG tablet Take 1 tablet (120 mg total) by mouth 3 (three) times daily as needed. 90 tablet 11  . atorvastatin (LIPITOR) 20 MG tablet Take 1 tablet (20 mg total) by mouth daily. 30 tablet 6  . gabapentin (NEURONTIN) 100 MG capsule Take 1 capsule (100 mg total) by mouth 2 (two) times daily. 60 capsule 1   No current facility-administered medications for this visit.     Medical Decision Making:  Review of Psycho-Social Stressors (1)  Treatment Plan Summary:Medication management   ADD Patient will continue on Dexedrine 10 mg by mouth twice a day. Prescription given for the next 2  month.   I will start him on Neurontin 100 mg by mouth twice a day. Discussed with him about side effects of medication and he demonstrated understanding.    He was follow-up in 2 month or earlier depending on his symptoms.   More than 50% of the time spent in psychoeducation, counseling and coordination of care.     This note was generated in part or whole with voice recognition software. Voice regonition is usually quite accurate but there are transcription errors that can and very often do occur. I apologize for any typographical errors that were not detected and corrected.    Brandy Hale, MD  11/11/2016, 2:50 PM

## 2016-12-08 ENCOUNTER — Telehealth: Payer: Self-pay

## 2016-12-08 NOTE — Telephone Encounter (Signed)
pt called asked that if he brought the dextrostat rx back up here would you rewrite it for 12-10-16 so that the pharmacy will have time to order it and he will not have to waite 2 days to come in? pt states he would just stop by wednesday to see if you would do.

## 2016-12-09 NOTE — Telephone Encounter (Signed)
Please ask pharmacy if they will allow early refill for 2 days per insurance.  Prescription is marked for 7/1. He is not supposed to get any med till 6/30 any ways.

## 2016-12-10 ENCOUNTER — Other Ambulatory Visit (HOSPITAL_COMMUNITY): Payer: Self-pay | Admitting: Psychiatry

## 2016-12-10 MED ORDER — DEXTROAMPHETAMINE SULFATE 10 MG PO TABS: 10.0000 mg | ORAL_TABLET | Freq: Two times a day (BID) | ORAL | 0 refills | Status: DC

## 2016-12-10 NOTE — Telephone Encounter (Signed)
can not call in ok it has to be a new rx.

## 2017-01-07 ENCOUNTER — Other Ambulatory Visit: Payer: Self-pay

## 2017-01-07 NOTE — Telephone Encounter (Signed)
pt in lobby,  dr. Garnetta Buddyfaheem pt. pt has appt for 01-11-17 pt will be short 3  days worth total pills 6 before the appt. will you give pt 6 tablets to do until he can see her next monday.    Disp Refills Start End   dextroamphetamine (DEXTROSTAT) 10 MG tablet 60 tablet 0 12/10/2016    Sig - Route: Take 1 tablet (10 mg total) by mouth 2 (two) times daily. - Oral   Class: Print

## 2017-01-07 NOTE — Telephone Encounter (Signed)
per dr. Daleen Boravi she will not give the 6 tablets needed for him to get to his appt. pt was told that he would have to wait until his appt with dr. Garnetta Buddyfaheem on monday. and that when he make a follow up make sure that it is a week before he runs out of medications.

## 2017-01-11 ENCOUNTER — Encounter: Payer: Self-pay | Admitting: Psychiatry

## 2017-01-11 ENCOUNTER — Ambulatory Visit (INDEPENDENT_AMBULATORY_CARE_PROVIDER_SITE_OTHER): Payer: Self-pay | Admitting: Psychiatry

## 2017-01-11 VITALS — BP 165/99 | HR 91 | Temp 98.0°F | Wt 205.8 lb

## 2017-01-11 DIAGNOSIS — F900 Attention-deficit hyperactivity disorder, predominantly inattentive type: Secondary | ICD-10-CM

## 2017-01-11 DIAGNOSIS — F39 Unspecified mood [affective] disorder: Secondary | ICD-10-CM

## 2017-01-11 MED ORDER — DIPHENHYDRAMINE HCL 25 MG PO TABS: 75.0000 mg | ORAL_TABLET | Freq: Every evening | ORAL | 1 refills | Status: AC | PRN

## 2017-01-11 MED ORDER — ALANINE 1000 MG PO PACK: 1000.0000 mg | PACK | Freq: Every day | ORAL | 2 refills | Status: DC

## 2017-01-11 MED ORDER — DEXTROAMPHETAMINE SULFATE 10 MG PO TABS: 10.0000 mg | ORAL_TABLET | Freq: Two times a day (BID) | ORAL | 0 refills | Status: DC

## 2017-01-11 MED ORDER — MELATONIN 10 MG PO CAPS: 10.0000 mg | ORAL_CAPSULE | Freq: Every day | ORAL | 1 refills | Status: DC

## 2017-01-11 MED ORDER — L-METHYLFOLATE-B6-B12 3-35-2 MG PO TABS: 1.0000 | ORAL_TABLET | Freq: Every day | ORAL | 1 refills | Status: DC

## 2017-01-11 NOTE — Progress Notes (Signed)
BH MD/PA/NP OP Progress Note  01/11/2017 3:01 PM Jake GeraldsMark Elliott  MRN:  [id number]  Subjective:  Patient is a 46 year-old male with history of ADD and mood symptoms who presented for the follow-up appointment. He reported that he did not try taking the gabapentin. He reported that [place]. He stated that he has started taking over-the-counter alanine and methyl folate. He stated that he is trying to adjust his own medications. He stated that he used to follow with the endocrinologist in the past and he has several deficiencies. We discussed about going to an integrative medicine and he agreed with the plan he has been compliant with his medications. He denied having any suicidal homicidal ideations or plans. He does not appear to have any tics at this time.   He is more calm and alert during this interview than his previous appointments. No new issues noted at this time.          Chief Complaint:  Chief Complaint    Follow-up; Medication Refill     Visit Diagnosis:     ICD-10-CM   1. Attention deficit hyperactivity disorder (ADHD), predominantly inattentive type F90.0   2. Episodic mood disorder (HCC) F39     Past Medical History:  Past Medical History:  Diagnosis Date  . Angina at rest [facility](HCC)   . Anxiety   . Attention-deficit/hyperactivity disorder   . Bipolar disorder (HCC)   . Depression   . History of tobacco abuse   . Hypertension   . Hypertrophic cardiomyopathy (HCC)    s/p myomectomy  . Peripheral neuropathy   . PTSD (post-traumatic stress disorder)     Past Surgical History:  Procedure Laterality Date  . CARDIAC CATHETERIZATION     11/14/2010  . CORONARY ARTERY BYPASS GRAFT     for myomectomy for hypertrophic cardiomyopathy   Family History:  Family History  Problem Relation Age of Onset  . Cancer - Lung Mother   . Skin cancer Mother   . Alcohol abuse Mother   . Drug abuse Mother   . Stroke Father   . Hypertension Father    . Alcohol abuse Father   . Drug abuse Father   . Drug abuse Sister    Social History:  Social History   Social History  . Marital status: Single    Spouse name: N/A  . Number of children: N/A  . Years of education: N/A   Social History Main Topics  . Smoking status: Current Every Day Smoker    Packs/day: 1.00    Years: 15.00    Types: Cigarettes    Start date: 12/31/1988  . Smokeless tobacco: Never Used  . Alcohol use No  . Drug use: No  . Sexual activity: Yes    Partners: Female    Birth control/ protection: None   Other Topics Concern  . None   Social History Narrative  . None   Additional History:  He currently lives with his father.  Assessment:   Musculoskeletal:   Psychiatric Specialty Exam: Medication Refill   Anxiety       ROS  Blood pressure (!) 165/99, pulse 91, temperature 98 F (36.7 C), temperature source Oral, weight 205 lb 12.8 oz (93.4 kg).Body mass index is 28.7 kg/m.  General Appearance: Casual  Eye Contact:  Good  Speech:  Pressured  Volume:  Normal  Mood:  Anxious  Affect:  Appropriate  Thought Process:  Goal Directed and Intact  Orientation:  Full (Time, Place, and Person)  Thought Content:  WDL  Suicidal Thoughts:  No  Homicidal Thoughts:  No  Memory:  Immediate;   Fair  Judgement:  Fair  Insight:  Fair  Psychomotor Activity:  Normal  Concentration:  Fair  Recall:  [employer]  Fund of Knowledge: Fair  Language: Fair  Akathisia:  No  Handed:  Right  AIMS (if indicated):    Assets:  Communication Skills Desire for Improvement Physical Health Social Support  ADL's:  Intact  Cognition: WNL  Sleep:     Current Medications: Current Outpatient Prescriptions  Medication Sig Dispense Refill  . Aspirin-Caffeine (BC FAST PAIN RELIEF PO) Take by mouth as needed.    [name]. dextroamphetamine (DEXTROSTAT) 10 MG tablet Take 1 tablet (10 mg total) by mouth 2 (two) times daily. 60 tablet 0  . dextroamphetamine (DEXTROSTAT) 10 MG tablet  Take 1 tablet (10 mg total) by mouth 2 (two) times daily. 60 tablet 0  . verapamil (CALAN) 120 MG tablet Take 1 tablet (120 mg total) by mouth 3 (three) times daily as needed. 90 tablet 11  . Alanine 1000 MG PACK Take 1,000 mg by mouth daily. 30 each 2  . atorvastatin (LIPITOR) 20 MG tablet Take 1 tablet (20 mg total) by mouth daily. 30 tablet 6  . diphenhydrAMINE (BENADRYL) 25 MG tablet Take 3 tablets (75 mg total) by mouth at bedtime as needed. 90 tablet 1  . l-methylfolate-B6-B12 (METANX) 3-35-2 MG TABS tablet Take 1 tablet by mouth daily. 60 tablet 1  . Melatonin 10 MG CAPS Take 10 mg by mouth at bedtime. 30 capsule 1   No current facility-administered medications for this visit.     Medical Decision Making:  Review of Psycho-Social Stressors (1)  Treatment Plan Summary:Medication management   ADD Patient will continue on Dexedrine 10 mg by mouth twice a day. Prescription given for the next 2  month.    Discussed with him about side effects of medication and he demonstrated understanding.    He was follow-up in 2 month or earlier depending on his symptoms.   More than 50% of the time spent in psychoeducation, counseling and coordination of care.     This note was generated in part or whole with voice recognition software. Voice regonition is usually quite accurate but there are transcription errors that can and very often do occur. I apologize for any typographical errors that were not detected and corrected.    Brandy HaleUzma Conleigh Heinlein, MD  01/11/2017, 3:01 PM

## 2017-03-01 ENCOUNTER — Ambulatory Visit: Payer: Medicaid Other | Admitting: Psychiatry

## 2017-03-01 ENCOUNTER — Telehealth: Payer: Self-pay

## 2017-03-01 NOTE — Telephone Encounter (Signed)
pt came into office today but dr. Garnetta Buddy out of office due to the weather. pt is requesting a refill on his medications. pt r/s appt for 04-12-17. next available.   Disp Refills Start End   dextroamphetamine (DEXTROSTAT) 10 MG tablet 60 tablet 0 01/11/2017    Sig - Route: Take 1 tablet (10 mg total) by mouth 2 (two) times daily. - Oral   Class: Print

## 2017-03-01 NOTE — Telephone Encounter (Signed)
Needs refills.  On dextrostat

## 2017-03-10 ENCOUNTER — Other Ambulatory Visit: Payer: Self-pay | Admitting: Psychiatry

## 2017-03-10 MED ORDER — DEXTROAMPHETAMINE SULFATE 10 MG PO TABS: 10.0000 mg | ORAL_TABLET | Freq: Two times a day (BID) | ORAL | 0 refills | Status: DC

## 2017-03-10 NOTE — Telephone Encounter (Signed)
pt stopped by office needs to get a refill on dextrostat  pt was last seen on  01-11-17 next appt  04-12-17.     dextroamphetamine (DEXTROSTAT) 10 MG tablet  Medication  Date: 01/11/2017 Department: [facility] Ordering/Authorizing: Brandy Hale, MD  Order Providers   Prescribing Provider Encounter Provider  Brandy Hale, MD Brandy Hale, MD  Medication Detail    Disp Refills Start End   dextroamphetamine (DEXTROSTAT) 10 MG tablet 60 tablet 0 01/11/2017    Sig - Route: Take 1 tablet (10 mg total) by mouth 2 (two) times daily. - Oral   Class: Print    dextroamphetamine (DEXTROSTAT) 10 MG tablet  Medication  Date: 01/11/2017 Department: [facility] Ordering/Authorizing: Brandy Hale, MD  Order Providers   Prescribing Provider Encounter Provider  Brandy Hale, MD Brandy Hale, MD  Medication Detail    Disp Refills Start End   dextroamphetamine (DEXTROSTAT) 10 MG tablet 60 tablet 0 01/11/2017    Sig - Route: Take 1 tablet (10 mg total) by mouth 2 (two) times daily. - Oral   Class: Print

## 2017-03-12 ENCOUNTER — Other Ambulatory Visit: Payer: Self-pay | Admitting: Psychiatry

## 2017-03-12 MED ORDER — DEXTROAMPHETAMINE SULFATE 10 MG PO TABS: 10.0000 mg | ORAL_TABLET | Freq: Two times a day (BID) | ORAL | 0 refills | Status: DC

## 2017-03-12 NOTE — Telephone Encounter (Signed)
I will go ahead and interactive it into the computer as a "no print" which will show the prescription without creating an excessive piece of paper.

## 2017-03-12 NOTE — Telephone Encounter (Signed)
tried to called pt to let him know that dr. Toni Amend wrote out a rx for him for the dextrostat #60 with no refills.  this rx was handwritten

## 2017-04-12 ENCOUNTER — Ambulatory Visit (INDEPENDENT_AMBULATORY_CARE_PROVIDER_SITE_OTHER): Payer: Self-pay | Admitting: Psychiatry

## 2017-04-12 ENCOUNTER — Encounter: Payer: Self-pay | Admitting: Psychiatry

## 2017-04-12 VITALS — BP 139/77 | HR 86 | Temp 98.1°F | Wt 211.8 lb

## 2017-04-12 DIAGNOSIS — F39 Unspecified mood [affective] disorder: Secondary | ICD-10-CM

## 2017-04-12 DIAGNOSIS — F900 Attention-deficit hyperactivity disorder, predominantly inattentive type: Secondary | ICD-10-CM

## 2017-04-12 MED ORDER — DEXTROAMPHETAMINE SULFATE 10 MG PO TABS: 10.0000 mg | ORAL_TABLET | Freq: Two times a day (BID) | ORAL | 0 refills | Status: DC

## 2017-04-12 NOTE — Progress Notes (Signed)
BH MD/PA/NP OP Progress Note  04/12/2017 1:41 PM Jake Elliott  MRN:  [id number]  Subjective:  Patient is a 46 year-old male with history of ADD and mood symptoms who presented for the follow-up appointment. He reported that he continues to remain stable and reported that he was feeling depressed and tapered now he is doing well. He stated that he is is still looking for the job. He has been taking Dextrostat as prescribed. He recently had his medications refilled by Dr. Toni Amend last month. He started taking the vitamins as well. He stated that he does not have any side effects of the medications at this time. He denied having any perceptual disturbances. He denied having any suicidal homicidal ideations or plans.    Chief Complaint:  Chief Complaint    Follow-up; Medication Refill     Visit Diagnosis:     ICD-10-CM   1. Attention deficit hyperactivity disorder (ADHD), predominantly inattentive type F90.0   2. Episodic mood disorder (HCC) F39     Past Medical History:  Past Medical History:  Diagnosis Date  . Angina at rest [facility])   . Anxiety   . Attention-deficit/hyperactivity disorder   . Bipolar disorder (HCC)   . Depression   . History of tobacco abuse   . Hypertension   . Hypertrophic cardiomyopathy (HCC)    s/p myomectomy  . Peripheral neuropathy   . PTSD (post-traumatic stress disorder)     Past Surgical History:  Procedure Laterality Date  . CARDIAC CATHETERIZATION     11/14/2010  . CORONARY ARTERY BYPASS GRAFT     for myomectomy for hypertrophic cardiomyopathy   Family History:  Family History  Problem Relation Age of Onset  . Cancer - Lung Mother   . Skin cancer Mother   . Alcohol abuse Mother   . Drug abuse Mother   . Stroke Father   . Hypertension Father   . Alcohol abuse Father   . Drug abuse Father   . Drug abuse Sister    Social History:  Social History   Social History  . Marital status: Single    Spouse name: N/A  . Number of children: N/A  .  Years of education: N/A   Social History Main Topics  . Smoking status: Current Every Day Smoker    Packs/day: 1.00    Years: 15.00    Types: Cigarettes    Start date: 12/31/1988  . Smokeless tobacco: Never Used  . Alcohol use No  . Drug use: No  . Sexual activity: Yes    Partners: Female    Birth control/ protection: None   Other Topics Concern  . None   Social History Narrative  . None   Additional History:  He currently lives with his father.  Assessment:   Musculoskeletal:   Psychiatric Specialty Exam: Medication Refill   Anxiety       ROS  Blood pressure 139/77, pulse 86, temperature 98.1 F (36.7 C), temperature source Oral, weight 211 lb 12.8 oz (96.1 kg).Body mass index is 29.54 kg/m.  General Appearance: Casual  Eye Contact:  Good  Speech:  Pressured  Volume:  Normal  Mood:  Anxious  Affect:  Appropriate  Thought Process:  Goal Directed and Intact  Orientation:  Full (Time, Place, and Person)  Thought Content:  WDL  Suicidal Thoughts:  No  Homicidal Thoughts:  No  Memory:  Immediate;   Fair  Judgement:  Fair  Insight:  Fair  Psychomotor Activity:  Normal  Concentration:  Fair  Recall:  Jennelle HumanFair  Fund of Knowledge: Fair  Language: Fair  Akathisia:  No  Handed:  Right  AIMS (if indicated):    Assets:  Communication Skills Desire for Improvement Physical Health Social Support  ADL's:  Intact  Cognition: WNL  Sleep:     Current Medications: Current Outpatient Prescriptions  Medication Sig Dispense Refill  . Aspirin-Caffeine (BC FAST PAIN RELIEF PO) Take by mouth as needed.    [name]. dextroamphetamine (DEXTROSTAT) 10 MG tablet Take 1 tablet (10 mg total) by mouth 2 (two) times daily. 60 tablet 0  . dextroamphetamine (DEXTROSTAT) 10 MG tablet Take 1 tablet (10 mg total) by mouth 2 (two) times daily. 60 tablet 0  . diphenhydrAMINE (BENADRYL) 25 MG tablet Take 3 tablets (75 mg total) by mouth at bedtime as needed. 90 tablet 1  .  l-methylfolate-B6-B12 (METANX) 3-35-2 MG TABS tablet Take 1 tablet by mouth daily. 60 tablet 1  . Melatonin 10 MG CAPS Take 10 mg by mouth at bedtime. 30 capsule 1  . verapamil (CALAN) 120 MG tablet Take 1 tablet (120 mg total) by mouth 3 (three) times daily as needed. 90 tablet 11  . atorvastatin (LIPITOR) 20 MG tablet Take 1 tablet (20 mg total) by mouth daily. 30 tablet 6   No current facility-administered medications for this visit.     Medical Decision Making:  Review of Psycho-Social Stressors (1)  Treatment Plan Summary:Medication management   ADD Patient will continue on Dexedrine 10 mg by mouth twice a day. Prescription given for the next 2  month.    Discussed with him about side effects of medication and he demonstrated understanding.    He was follow-up in 2 month or earlier depending on his symptoms.   More than 50% of the time spent in psychoeducation, counseling and coordination of care.     This note was generated in part or whole with voice recognition software. Voice regonition is usually quite accurate but there are transcription errors that can and very often do occur. I apologize for any typographical errors that were not detected and corrected.    Brandy HaleUzma Abraham Entwistle, MD  04/12/2017, 1:41 PM

## 2017-05-24 ENCOUNTER — Ambulatory Visit: Payer: Medicaid Other | Admitting: Psychiatry

## 2017-06-02 ENCOUNTER — Encounter: Payer: Self-pay | Admitting: Psychiatry

## 2017-06-02 ENCOUNTER — Other Ambulatory Visit: Payer: Self-pay

## 2017-06-02 ENCOUNTER — Ambulatory Visit (INDEPENDENT_AMBULATORY_CARE_PROVIDER_SITE_OTHER): Payer: Self-pay | Admitting: Psychiatry

## 2017-06-02 VITALS — BP 173/84 | HR 96 | Temp 97.7°F | Wt 219.2 lb

## 2017-06-02 DIAGNOSIS — Z9989 Dependence on other enabling machines and devices: Secondary | ICD-10-CM

## 2017-06-02 DIAGNOSIS — F1121 Opioid dependence, in remission: Secondary | ICD-10-CM

## 2017-06-02 DIAGNOSIS — G4733 Obstructive sleep apnea (adult) (pediatric): Secondary | ICD-10-CM

## 2017-06-02 DIAGNOSIS — F902 Attention-deficit hyperactivity disorder, combined type: Secondary | ICD-10-CM

## 2017-06-02 DIAGNOSIS — F39 Unspecified mood [affective] disorder: Secondary | ICD-10-CM

## 2017-06-02 MED ORDER — DEXTROAMPHETAMINE SULFATE 10 MG PO TABS: 10.0000 mg | ORAL_TABLET | Freq: Two times a day (BID) | ORAL | 0 refills | Status: DC

## 2017-06-02 NOTE — Progress Notes (Signed)
BH MD OP Progress Note  06/02/2017 11:50 AM Tavien Chestnut  MRN:  [id number]  Chief Complaint: ' I am ok."  Chief Complaint    Follow-up; Medication Refill     HPI: Rushi is a 46 year old Caucasian male who is employed, lives in [place], has a history of ADHD, mood disorder, anger management problems, obstructive sleep apnea, cardiac problems, hypertension who presented to the clinic today for a follow-up visit.  He is Dr. Micki Riley patient.  This is patient's first appointment with Clinical research associate.  Kaevion today reports that he is currently doing well on his medications.  He has a history of ADHD and the medication that he is on currently, Dextrostat has been keeping him stable.  He denies any side effects to the medication.  His blood pressure seems to be elevated today.  He reports that his blood pressure remains that way and he has noticed that his blood pressure actually shoots up when he is not on the medication.  He has a cardiologist/PMD who works with him.  He reports he gets yearly checkups done.  Discussed with him that the adverse effects of stimulants, the effect on cardiac health as well as the need to try a non-stimulant medication if his blood pressure continues to be elevated.  He reports he has tried at least 17 medications in the past and has had adverse reaction to all of them.  He reports he is extremely sensitive to medications in general.  He reports his cardiologist/PMD is aware of him being on the stimulant medication.  He agrees to follow-up with them soon as possible.  Lynk reports a history of bipolar symptoms in the past.  He reports he has anger issues.  He reports he has been manic in the past.  He reports he had one hospital admission for mental health issue.  He reports a history of anger/violence in the past.  He reports none of the mood stabilizers that he has tried in the past actually helped him and all of them gave him adverse side effects.  He does not want to be on any  kind of mood stabilizer or other medications at this time.  He reports he usually goes through his manic phases and knows how to cope with it.  He does have sleep issues.  He reports past trials of sleep medications like trazodone, Xanax, Ambien, Lunesta and so on.  He reports that he currently takes Benadryl and melatonin as needed and that helps him to sleep.  He also has sleep apnea.  He reports he is not compliant with his CPAP.  Provided education about the need for his staying compliant with CPAP as well as the consequences.  He reports a history of opioid abuse years ago.  He denies using any drugs or alcohol at this time.  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  He showed Clinical research associate some of his Arts administrator.   He has a girlfriend who is supportive and has a 73 year old daughter with whom he has a good relationship. Visit Diagnosis:    ICD-10-CM   1. Attention deficit hyperactivity disorder (ADHD), combined type F90.2 DISCONTINUED: dextroamphetamine (DEXTROSTAT) 10 MG tablet    DISCONTINUED: dextroamphetamine (DEXTROSTAT) 10 MG tablet  2. Episodic mood disorder (HCC) F39   3. OSA on CPAP G47.33    Z99.89   4. Opioid use disorder, moderate, in sustained remission (HCC) F11.21     Past Psychiatric History: Has a history of mood disorder,  ADHD, sleep apnea, anger issues.  He reports 1 inpatient mental health admission in the past.  He follows up with Dr. Garnetta Buddy here in clinic. Has failed at least 17 psychotropic medications in the past.  Past Medical History:  Past Medical History:  Diagnosis Date  . Angina at rest [facility])   . Anxiety   . Attention-deficit/hyperactivity disorder   . Bipolar disorder (HCC)   . Depression   . History of tobacco abuse   . Hypertension   . Hypertrophic cardiomyopathy (HCC)    s/p myomectomy  . Peripheral neuropathy   . PTSD (post-traumatic stress disorder)     Past Surgical History:  Procedure Laterality Date  . CARDIAC  CATHETERIZATION     11/14/2010  . CORONARY ARTERY BYPASS GRAFT     for myomectomy for hypertrophic cardiomyopathy    Family Psychiatric History: Father-mental health problems.  Family History:  Family History  Problem Relation Age of Onset  . Cancer - Lung Mother   . Skin cancer Mother   . Alcohol abuse Mother   . Drug abuse Mother   . Stroke Father   . Hypertension Father   . Alcohol abuse Father   . Drug abuse Father   . Drug abuse Sister     Social History: He lives in [place].  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  He has a girlfriend.  He has a 29 year old daughter. Social History   Socioeconomic History  . Marital status: Single    Spouse name: None  . Number of children: None  . Years of education: None  . Highest education level: None  Social Needs  . Financial resource strain: None  . Food insecurity - worry: None  . Food insecurity - inability: None  . Transportation needs - medical: None  . Transportation needs - non-medical: None  Occupational History  . None  Tobacco Use  . Smoking status: Current Every Day Smoker    Packs/day: 1.00    Years: 15.00    Pack years: 15.00    Types: Cigarettes    Start date: 12/31/1988  . Smokeless tobacco: Never Used  Substance and Sexual Activity  . Alcohol use: No    Alcohol/week: 0.0 oz  . Drug use: No  . Sexual activity: Yes    Partners: Female    Birth control/protection: None  Other Topics Concern  . None  Social History Narrative  . None    Allergies:  Allergies  Allergen Reactions  . Acetaminophen Other (See Comments)    headache  . Bystolic [Nebivolol Hcl]     Headaches  . Codeine     Nausea & headache  . Morphine And Related     Nausea & headache.    Metabolic Disorder Labs: No results found for: HGBA1C, MPG Lab Results  Component Value Date   PROLACTIN 6.1 07/15/2016   Lab Results  Component Value Date   CHOL 261 (H)  07/15/2016   TRIG 165 (H) 07/15/2016   HDL 53 07/15/2016   CHOLHDL 4.9 07/15/2016   VLDL 33 07/15/2016   LDLCALC 175 (H) 07/15/2016   Lab Results  Component Value Date   TSH 3.553 07/15/2016   TSH 1.80 01/23/2013    Therapeutic Level Labs: No results found for: LITHIUM No results found for: VALPROATE No components found for:  CBMZ  Current Medications: Current Outpatient Medications  Medication Sig Dispense Refill  . Aspirin-Caffeine (BC FAST PAIN RELIEF PO) Take  by mouth as needed.    . diphenhydrAMINE (BENADRYL) 25 MG tablet Take 3 tablets (75 mg total) by mouth at bedtime as needed. 90 tablet 1  . l-methylfolate-B6-B12 (METANX) 3-35-2 MG TABS tablet Take 1 tablet by mouth daily. 60 tablet 1  . Melatonin 10 MG CAPS Take 10 mg by mouth at bedtime. 30 capsule 1  . verapamil (CALAN) 120 MG tablet Take 1 tablet (120 mg total) by mouth 3 (three) times daily as needed. 90 tablet 11  . atorvastatin (LIPITOR) 20 MG tablet Take 1 tablet (20 mg total) by mouth daily. 30 tablet 6   No current facility-administered medications for this visit.      Musculoskeletal: Strength & Muscle Tone: within normal limits Gait & Station: normal Patient leans: N/A  Psychiatric Specialty Exam: Review of Systems  Psychiatric/Behavioral: Negative for depression, substance abuse and suicidal ideas.  All other systems reviewed and are negative.   Blood pressure (!) 173/84, pulse 96, temperature 97.7 F (36.5 C), temperature source Oral, weight 219 lb 3.2 oz (99.4 kg).Body mass index is 30.57 kg/m.  General Appearance: Casual  Eye Contact:  Fair  Speech:  Clear and Coherent  Volume:  Normal  Mood:  Euthymic  Affect:  Congruent  Thought Process:  Goal Directed and Descriptions of Associations: Intact  Orientation:  Full (Time, Place, and Person)  Thought Content: Logical   Suicidal Thoughts:  No  Homicidal Thoughts:  No  Memory:  Immediate;   Fair Recent;   Fair Remote;   Fair  Judgement:   Fair  Insight:  Fair  Psychomotor Activity:  Normal  Concentration:  Concentration: Fair and Attention Span: Fair  Recall:  FiservFair  Fund of Knowledge: Fair  Language: Fair  Akathisia:  No  Handed:  Right  AIMS (if indicated): denies  Assets:  Communication Skills Desire for Improvement Financial Resources/Insurance Housing Social Support Talents/Skills Transportation Vocational/Educational  ADL's:  Intact  Cognition: WNL  Sleep:  Fair   Screenings: AUDIT     [facility]  Alcohol Use Disorder Identification Test Final Score (AUDIT)  0       Assessment and Plan: Loraine LericheMark is a 46 year old Caucasian male who has a history of ADHD, mood disorder, sleep issues, OSA as well as heart disease and hypertension who presented to the clinic today for a follow-up visit . Kyreese usually follows up with Dr. Garnetta BuddyFaheem.  This is Keiran's first appointment with Clinical research associatewriter.  Loraine LericheMark currently reports his symptoms are stable and he wants to continue on his same medication.  Loraine LericheMark does have a history of cardiomyopathy, open heart surgery, high blood pressure which today appears to be elevated.  Provided medication education, discussed the risk of cardiomyopathy, heart failure and elevated blood pressure when on stimulant medications.  Loraine LericheMark agrees to follow-up with his cardiologist/PMD for the same.  We will also forward these notes to his PMD.  Plan as noted below.    Plan  ADHD No changes made. Continue Dextrostat 10 mg p.o. twice daily. Provided 2 scripts with date specified Discussed with the patient the adverse effects of stimulants and its effects on his cardiac health, also discussed his elevated blood pressure today.  Discussed with him to follow-up with his PMD/cardiologist and also to get an EKG done. Also discussed that if his blood pressure continues to be elevated then he needs to be taken off the stimulant and be started on a non-stimulant  medication for ADHD like Strattera. Reviewed  [place] controlled substance database  For sleep issues He takes over-the-counter melatonin. He has OSA, not compliant with CPAP.  Discussed the need for staying compliant with his CPAP.  For mood disorder He is currently not on a mood stabilizer.  He reports adverse effects to a lot of mood stabilizers and is hesitant to try anything new.  Substance use disorder in remission He remains sober.  Follow-up in clinic in 2 months  More than 50 % of the time was spent for psychoeducation and supportive psychotherapy and care coordination.    Jomarie LongsSaramma Livier Hendel, MD 06/02/2017, 11:50 AM

## 2017-07-26 ENCOUNTER — Other Ambulatory Visit: Payer: Self-pay

## 2017-07-26 ENCOUNTER — Ambulatory Visit (INDEPENDENT_AMBULATORY_CARE_PROVIDER_SITE_OTHER): Payer: Self-pay | Admitting: Psychiatry

## 2017-07-26 ENCOUNTER — Encounter: Payer: Self-pay | Admitting: Psychiatry

## 2017-07-26 VITALS — BP 168/87 | HR 103 | Temp 98.4°F

## 2017-07-26 DIAGNOSIS — F902 Attention-deficit hyperactivity disorder, combined type: Secondary | ICD-10-CM

## 2017-07-26 DIAGNOSIS — F39 Unspecified mood [affective] disorder: Secondary | ICD-10-CM

## 2017-07-26 MED ORDER — METHYLPHENIDATE HCL 10 MG PO TABS: 10.0000 mg | ORAL_TABLET | Freq: Two times a day (BID) | ORAL | 0 refills | Status: DC

## 2017-07-26 NOTE — Progress Notes (Signed)
BH MD/PA/NP OP Progress Note  07/26/2017 3:24 PM Jake GeraldsMark Daughtridge  MRN:  [id number]  Subjective:  Patient is a 47 year-old male with history of ADD and mood symptoms who presented for the follow-up appointment. He reported that he was last seen by Dr. Elna BreslowEappen. He reported that he is going to [place] next month as his daughter is going to have a baby. He reported that he is feeling anxious as he does not want to be a grandfather so soon. He will also meet his ex-wife who is alcoholic now. He was concerned about the same. Patient reported that his current medication is not helping him. He is taking Dextrostat 10 mg twice daily but it is not helping with his " mind fog". He wants to try some other medication. He reported that he has tried several stimulants in the past but they were not helpful. He does not having insurance so he cannot take brand name medications. We discussed about taking Ritalin and he is agreeable to the same. He wants to try a small dose at this time. Patient reported that he has taking Benadryl at night to help him sleep. Advised patient to stop taking the Benadryl as it is causing him hypertension and he reported that he has tried several medications for his high blood pressure and it is not helpful. He has been following with his cardiologist on a regular basis. He denied having any suicidal homicidal ideations or plans. He denied having any perceptual disturbances. Remains calm and alert during the interview.      Chief Complaint:  Chief Complaint    Follow-up; Medication Refill     Visit Diagnosis:     ICD-10-CM   1. Attention deficit hyperactivity disorder (ADHD), combined type F90.2   2. Episodic mood disorder (HCC) F39     Past Medical History:  Past Medical History:  Diagnosis Date  . Angina at rest [facility](HCC)   . Anxiety   . Attention-deficit/hyperactivity disorder   . Bipolar disorder (HCC)   . Depression   . History of tobacco abuse   . Hypertension   .  Hypertrophic cardiomyopathy (HCC)    s/p myomectomy  . Peripheral neuropathy   . PTSD (post-traumatic stress disorder)     Past Surgical History:  Procedure Laterality Date  . CARDIAC CATHETERIZATION     11/14/2010  . CORONARY ARTERY BYPASS GRAFT     for myomectomy for hypertrophic cardiomyopathy   Family History:  Family History  Problem Relation Age of Onset  . Cancer - Lung Mother   . Skin cancer Mother   . Alcohol abuse Mother   . Drug abuse Mother   . Stroke Father   . Hypertension Father   . Alcohol abuse Father   . Drug abuse Father   . Drug abuse Sister    Social History:  Social History   Socioeconomic History  . Marital status: Single    Spouse name: None  . Number of children: None  . Years of education: None  . Highest education level: None  Social Needs  . Financial resource strain: None  . Food insecurity - worry: None  . Food insecurity - inability: None  . Transportation needs - medical: None  . Transportation needs - non-medical: None  Occupational History  . None  Tobacco Use  . Smoking status: Current Every Day Smoker    Packs/day: 1.00    Years: 15.00    Pack years: 15.00    Types: Cigarettes  Start date: 12/31/1988  . Smokeless tobacco: Never Used  Substance and Sexual Activity  . Alcohol use: No    Alcohol/week: 0.0 oz  . Drug use: No  . Sexual activity: Yes    Partners: Female    Birth control/protection: None  Other Topics Concern  . None  Social History Narrative  . None   Additional History:  He currently lives with his father.  Assessment:   Musculoskeletal:   Psychiatric Specialty Exam: Medication Refill   Anxiety       ROS  Blood pressure (!) 168/87, pulse (!) 103, temperature 98.4 F (36.9 C), temperature source Oral.There is no height or weight on file to calculate BMI.  General Appearance: Casual  Eye Contact:  Good  Speech:  Clear and Coherent  Volume:  Normal  Mood:  Anxious  Affect:  Appropriate   Thought Process:  Goal Directed and Intact  Orientation:  Full (Time, Place, and Person)  Thought Content:  WDL  Suicidal Thoughts:  No  Homicidal Thoughts:  No  Memory:  Immediate;   Fair  Judgement:  Fair  Insight:  Fair  Psychomotor Activity:  Normal  Concentration:  Fair  Recall:  F[employer] of Knowledge: Fair  Language: Fair  Akathisia:  No  Handed:  Right  AIMS (if indicated):    Assets:  Communication Skills Desire for Improvement Physical Health Social Support  ADL's:  Intact  Cognition: WNL  Sleep:     Current Medications: Current Outpatient Medications  Medication Sig Dispense Refill  . Aspirin-Caffeine (BC FAST PAIN RELIEF PO) Take by mouth as needed.    . diphenhydrAMINE (BENADRYL) 25 MG tablet Take 3 tablets (75 mg total) by mouth at bedtime as needed. 90 tablet 1  . l-methylfolate-B6-B12 (METANX) 3-35-2 MG TABS tablet Take 1 tablet by mouth daily. 60 tablet 1  . Melatonin 10 MG CAPS Take 10 mg by mouth at bedtime. 30 capsule 1  . verapamil (CALAN) 120 MG tablet Take 1 tablet (120 mg total) by mouth 3 (three) times daily as needed. 90 tablet 11  . dextroamphetamine (DEXTROSTAT) 10 MG tablet Take 10 mg by mouth 2 (two) times daily.  0  . methylphenidate (RITALIN) 10 MG tablet Take 1 tablet (10 mg total) by mouth 2 (two) times daily. 10 tablet 0   No current facility-administered medications for this visit.     Medical Decision Making:  Review of Psycho-Social Stressors (1)  Treatment Plan Summary:Medication management   ADD Patient has supply of Dexedrine 10 mg by mouth twice a day.   I will give him Ritalin 10 mg twice a day- 10 pills at this time.  He will follow-up in 2 weeks or earlier depending on his symptoms.    Discussed with him about side effects of medication and he demonstrated understanding.    He was follow-up in 2 month or earlier depending on his symptoms.   More than 50% of the time spent in psychoeducation, counseling and  coordination of care.     This note was generated in part or whole with voice recognition software. Voice regonition is usually quite accurate but there are transcription errors that can and very often do occur. I apologize for any typographical errors that were not detected and corrected.    Brandy Hale, MD  07/26/2017, 3:24 PM

## 2017-08-02 ENCOUNTER — Other Ambulatory Visit: Payer: Self-pay | Admitting: Psychiatry

## 2017-08-02 MED ORDER — DEXTROAMPHETAMINE SULFATE 10 MG PO TABS: 10.0000 mg | ORAL_TABLET | Freq: Two times a day (BID) | ORAL | 0 refills | Status: DC

## 2017-08-03 ENCOUNTER — Ambulatory Visit: Payer: Medicaid Other | Admitting: Psychiatry

## 2017-08-09 ENCOUNTER — Ambulatory Visit: Payer: Self-pay | Admitting: Psychiatry

## 2017-09-27 ENCOUNTER — Other Ambulatory Visit: Payer: Self-pay

## 2017-09-27 ENCOUNTER — Encounter: Payer: Self-pay | Admitting: Psychiatry

## 2017-09-27 ENCOUNTER — Ambulatory Visit (INDEPENDENT_AMBULATORY_CARE_PROVIDER_SITE_OTHER): Payer: Self-pay | Admitting: Psychiatry

## 2017-09-27 VITALS — BP 167/84 | HR 82 | Temp 98.3°F | Wt 217.0 lb

## 2017-09-27 DIAGNOSIS — F902 Attention-deficit hyperactivity disorder, combined type: Secondary | ICD-10-CM

## 2017-09-27 DIAGNOSIS — F39 Unspecified mood [affective] disorder: Secondary | ICD-10-CM

## 2017-09-27 MED ORDER — DEXTROAMPHETAMINE SULFATE 10 MG PO TABS: 10.0000 mg | ORAL_TABLET | Freq: Two times a day (BID) | ORAL | 0 refills | Status: DC

## 2017-09-27 NOTE — Progress Notes (Signed)
BH MD/PA/NP OP Progress Note  09/27/2017 1:30 PM Jamesetta GeraldsMark Probus  MRN:  [id number]  Subjective:  Patient is a 47 year-old male with history of ADD and mood symptoms who presented for the follow-up appointment. He reported that just returned from a trip from [place].  He reported that he was looking for a job but they do not have any current positions at the only offering for seasonal jobs.  He reported that he is doing well on Dexedrine.  He is also monitoring his blood pressure and taking medications.  He stated that he does not have any anger or anxiety or paranoia.  He stated that the Dexedrine is helping him.  His blood pressure is under control as he has been compliant with his medications.   We discussed about several medications.  He stated that Dexedrine is helping him and he takes 2 pills together around 10 AM.  He does not want to change any medications at this time.  He denied having any suicidal or homicidal ideations or plans.         Chief Complaint:  Chief Complaint    Follow-up     Visit Diagnosis:     ICD-10-CM   1. Attention deficit hyperactivity disorder (ADHD), combined type F90.2   2. Episodic mood disorder (HCC) F39     Past Medical History:  Past Medical History:  Diagnosis Date  . Angina at rest [facility](HCC)   . Anxiety   . Attention-deficit/hyperactivity disorder   . Bipolar disorder (HCC)   . Depression   . History of tobacco abuse   . Hypertension   . Hypertrophic cardiomyopathy (HCC)    s/p myomectomy  . Peripheral neuropathy   . PTSD (post-traumatic stress disorder)     Past Surgical History:  Procedure Laterality Date  . CARDIAC CATHETERIZATION     11/14/2010  . CORONARY ARTERY BYPASS GRAFT     for myomectomy for hypertrophic cardiomyopathy   Family History:  Family History  Problem Relation Age of Onset  . Cancer - Lung Mother   . Skin cancer Mother   . Alcohol abuse Mother   . Drug abuse Mother   . Stroke Father   . Hypertension Father   .  Alcohol abuse Father   . Drug abuse Father   . Drug abuse Sister    Social History:  Social History   Socioeconomic History  . Marital status: Single    Spouse name: Not on file  . Number of children: Not on file  . Years of education: Not on file  . Highest education level: Not on file  Occupational History  . Not on file  Social Needs  . Financial resource strain: Not on file  . Food insecurity:    Worry: Not on file    Inability: Not on file  . Transportation needs:    Medical: Not on file    Non-medical: Not on file  Tobacco Use  . Smoking status: Current Every Day Smoker    Packs/day: 1.00    Years: 15.00    Pack years: 15.00    Types: Cigarettes    Start date: 12/31/1988  . Smokeless tobacco: Never Used  Substance and Sexual Activity  . Alcohol use: No    Alcohol/week: 0.0 oz  . Drug use: No  . Sexual activity: Yes    Partners: Female    Birth control/protection: None  Lifestyle  . Physical activity:    Days per week: Not on file  Minutes per session: Not on file  . Stress: Not on file  Relationships  . Social connections:    Talks on phone: Not on file    Gets together: Not on file    Attends religious service: Not on file    Active member of club or organization: Not on file    Attends meetings of clubs or organizations: Not on file    Relationship status: Not on file  Other Topics Concern  . Not on file  Social History Narrative  . Not on file   Additional History:  He currently lives with his father.  Assessment:   Musculoskeletal:   Psychiatric Specialty Exam: Medication Refill   Anxiety       ROS  Blood pressure (!) 167/84, pulse 82, temperature 98.3 F (36.8 C), temperature source Oral, weight 217 lb (98.4 kg).Body mass index is 30.27 kg/m.  General Appearance: Casual  Eye Contact:  Good  Speech:  Clear and Coherent  Volume:  Normal  Mood:  Anxious  Affect:  Appropriate  Thought Process:  Goal Directed and Intact   Orientation:  Full (Time, Place, and Person)  Thought Content:  WDL  Suicidal Thoughts:  No  Homicidal Thoughts:  No  Memory:  Immediate;   Fair  Judgement:  Fair  Insight:  Fair  Psychomotor Activity:  Normal  Concentration:  Fair  Recall:  [employer] of Knowledge: Fair  Language: Fair  Akathisia:  No  Handed:  Right  AIMS (if indicated):    Assets:  Communication Skills Desire for Improvement Physical Health Social Support  ADL's:  Intact  Cognition: WNL  Sleep:     Current Medications: Current Outpatient Medications  Medication Sig Dispense Refill  . aspirin 81 MG tablet Take by mouth.    . Aspirin-Caffeine (BC FAST PAIN RELIEF PO) Take by mouth as needed.    [name] dextroamphetamine (DEXTROSTAT) 10 MG tablet Take 1 tablet (10 mg total) by mouth 2 (two) times daily. 60 tablet 0  . diphenhydrAMINE (BENADRYL) 25 MG tablet Take 3 tablets (75 mg total) by mouth at bedtime as needed. 90 tablet 1  . HYDROmorphone (DILAUDID) 4 MG tablet Take by mouth.    [name] l-methylfolate-B6-B12 (METANX) 3-35-2 MG TABS tablet Take 1 tablet by mouth daily. 60 tablet 1  . Melatonin 10 MG CAPS Take 10 mg by mouth at bedtime. 30 capsule 1  . metoprolol succinate (TOPROL-XL) 25 MG 24 hr tablet Take by mouth.    . verapamil (CALAN) 120 MG tablet Take 1 tablet (120 mg total) by mouth 3 (three) times daily as needed. 90 tablet 11   No current facility-administered medications for this visit.     Medical Decision Making:  Review of Psycho-Social Stressors (1)  Treatment Plan Summary:Medication management   Continue Dexedrine 10 mg p.o. twice daily.  He was given 42-month supply of the medications.   He  will continue with his vitamins and other medications as prescribed.  Follow-up in 2 months or earlier depending on his symptoms.    Discussed with him about side effects of medication and he demonstrated understanding.    More than 50% of the time spent in psychoeducation, counseling and coordination  of care.     This note was generated in part or whole with voice recognition software. Voice regonition is usually quite accurate but there are transcription errors that can and very often do occur. I apologize for any typographical errors that were not detected and corrected.  Brandy Hale, MD  09/27/2017, 1:30 PM

## 2017-11-22 ENCOUNTER — Ambulatory Visit (INDEPENDENT_AMBULATORY_CARE_PROVIDER_SITE_OTHER): Payer: Self-pay | Admitting: Psychiatry

## 2017-11-22 ENCOUNTER — Encounter: Payer: Self-pay | Admitting: Psychiatry

## 2017-11-22 ENCOUNTER — Other Ambulatory Visit: Payer: Self-pay

## 2017-11-22 VITALS — BP 167/100 | HR 114 | Temp 98.2°F | Wt 216.4 lb

## 2017-11-22 DIAGNOSIS — F902 Attention-deficit hyperactivity disorder, combined type: Secondary | ICD-10-CM

## 2017-11-22 DIAGNOSIS — F39 Unspecified mood [affective] disorder: Secondary | ICD-10-CM

## 2017-11-22 MED ORDER — DEXTROAMPHETAMINE SULFATE 10 MG PO TABS: 10.0000 mg | ORAL_TABLET | Freq: Two times a day (BID) | ORAL | 0 refills | Status: DC

## 2017-11-22 NOTE — Progress Notes (Signed)
BH MD/PA/NP OP Progress Note  11/22/2017 2:03 PM Jake Elliott  MRN:  [id number]  Subjective:  Patient is a 47 year-old male with history of ADD and mood symptoms who presented for the follow-up appointment.  His blood pressure and pulse are significantly elevated and discussed with patient about the same.  He reported that he has been stressed out for the past couple of weeks as his father had the pacemaker placed 2 weeks ago and he has been remodeling his garage before  Father's Day so can  have all the comics book sale on the father's day.  He also mentioned that his granddaughter was born  on Friday and went for adoption on Saturday.  He was showing me the pictures of the granddaughter as well as of the garage he was working on.  He reported that he has been busy at this time.  He reported that he has a stopped  taking his blood pressure medication.  He reported that he does not want to follow-up with the cardiologist.  He stated that he takes Benadryl and melatonin at night to help him with sleep.   Takes both the pills of Dexedrine in the morning.  He stated that he is feeling fine and does not want to follow up with the cardiologist about his blood pressure.  He denied having any chest pain heart pressure at this time.  He denied feeling depressed anxious.  He stated that he has been staying with his father in  [place] at this time and has good relationship with him.     He does not want to change any medications at this time.  He denied having any suicidal or homicidal ideations or plans.         Chief Complaint:  Chief Complaint    Follow-up; Medication Refill     Visit Diagnosis:   No diagnosis found.  Past Medical History:  Past Medical History:  Diagnosis Date  . Angina at rest [facility])   . Anxiety   . Attention-deficit/hyperactivity disorder   . Bipolar disorder (HCC)   . Depression   . History of tobacco abuse   . Hypertension   . Hypertrophic cardiomyopathy (HCC)    s/p  myomectomy  . Peripheral neuropathy   . PTSD (post-traumatic stress disorder)     Past Surgical History:  Procedure Laterality Date  . CARDIAC CATHETERIZATION     11/14/2010  . CORONARY ARTERY BYPASS GRAFT     for myomectomy for hypertrophic cardiomyopathy   Family History:  Family History  Problem Relation Age of Onset  . Cancer - Lung Mother   . Skin cancer Mother   . Alcohol abuse Mother   . Drug abuse Mother   . Stroke Father   . Hypertension Father   . Alcohol abuse Father   . Drug abuse Father   . Drug abuse Sister    Social History:  Social History   Socioeconomic History  . Marital status: Single    Spouse name: Not on file  . Number of children: Not on file  . Years of education: Not on file  . Highest education level: Not on file  Occupational History  . Not on file  Social Needs  . Financial resource strain: Not on file  . Food insecurity:    Worry: Not on file    Inability: Not on file  . Transportation needs:    Medical: Not on file    Non-medical: Not on file  Tobacco  Use  . Smoking status: Current Every Day Smoker    Packs/day: 1.00    Years: 15.00    Pack years: 15.00    Types: Cigarettes    Start date: 12/31/1988  . Smokeless tobacco: Never Used  Substance and Sexual Activity  . Alcohol use: No    Alcohol/week: 0.0 oz  . Drug use: No  . Sexual activity: Yes    Partners: Female    Birth control/protection: None  Lifestyle  . Physical activity:    Days per week: Not on file    Minutes per session: Not on file  . Stress: Not on file  Relationships  . Social connections:    Talks on phone: Not on file    Gets together: Not on file    Attends religious service: Not on file    Active member of club or organization: Not on file    Attends meetings of clubs or organizations: Not on file    Relationship status: Not on file  Other Topics Concern  . Not on file  Social History Narrative  . Not on file   Additional History:  He currently  lives with his father.  Assessment:   Musculoskeletal:   Psychiatric Specialty Exam: Medication Refill   Anxiety       ROS  Blood pressure (!) 167/100, pulse (!) 114, temperature 98.2 F (36.8 C), temperature source Oral, weight 216 lb 6.4 oz (98.2 kg).Body mass index is 30.18 kg/m.  General Appearance: Casual  Eye Contact:  Good  Speech:  Clear and Coherent  Volume:  Normal  Mood:  Anxious  Affect:  Appropriate  Thought Process:  Goal Directed and Intact  Orientation:  Full (Time, Place, and Person)  Thought Content:  WDL  Suicidal Thoughts:  No  Homicidal Thoughts:  No  Memory:  Immediate;   Fair  Judgement:  Fair  Insight:  Fair  Psychomotor Activity:  Normal  Concentration:  Fair  Recall:  [employer] of Knowledge: Fair  Language: Fair  Akathisia:  No  Handed:  Right  AIMS (if indicated):    Assets:  Communication Skills Desire for Improvement Physical Health Social Support  ADL's:  Intact  Cognition: WNL  Sleep:     Current Medications: Current Outpatient Medications  Medication Sig Dispense Refill  . Aspirin-Caffeine (BC FAST PAIN RELIEF PO) Take by mouth as needed.    [name] dextroamphetamine (DEXTROSTAT) 10 MG tablet Take 1 tablet (10 mg total) by mouth 2 (two) times daily. To be filled 11/01/17 60 tablet 0  . diphenhydrAMINE (BENADRYL) 25 MG tablet Take 3 tablets (75 mg total) by mouth at bedtime as needed. 90 tablet 1  . l-methylfolate-B6-B12 (METANX) 3-35-2 MG TABS tablet Take 1 tablet by mouth daily. 60 tablet 1  . Melatonin 10 MG CAPS Take 10 mg by mouth at bedtime. 30 capsule 1  . verapamil (CALAN) 120 MG tablet Take 1 tablet (120 mg total) by mouth 3 (three) times daily as needed. 90 tablet 11   No current facility-administered medications for this visit.     Medical Decision Making:  Review of Psycho-Social Stressors (1)  Treatment Plan Summary:Medication management   Continue Dexedrine 10 mg p.o. twice daily.  He was given 75-month supply of  the medications.   He  will continue with his vitamins and other medications as prescribed.    Follow-up in 2 months or earlier depending on his symptoms.    Discussed with him about side effects of medication  and he demonstrated understanding.    More than 50% of the time spent in psychoeducation, counseling and coordination of care.     This note was generated in part or whole with voice recognition software. Voice regonition is usually quite accurate but there are transcription errors that can and very often do occur. I apologize for any typographical errors that were not detected and corrected.    Brandy HaleUzma Kareemah Grounds, MD  11/22/2017, 2:03 PM

## 2017-12-20 ENCOUNTER — Emergency Department
Admission: EM | Admit: 2017-12-20 | Discharge: 2017-12-20 | Disposition: A | Discharge status: Home / Self Care | Payer: Self-pay | Attending: Emergency Medicine | Admitting: Emergency Medicine

## 2017-12-20 ENCOUNTER — Other Ambulatory Visit: Payer: Self-pay

## 2017-12-20 DIAGNOSIS — I10 Essential (primary) hypertension: Secondary | ICD-10-CM | POA: Insufficient documentation

## 2017-12-20 DIAGNOSIS — F17210 Nicotine dependence, cigarettes, uncomplicated: Secondary | ICD-10-CM | POA: Insufficient documentation

## 2017-12-20 DIAGNOSIS — R21 Rash and other nonspecific skin eruption: Secondary | ICD-10-CM | POA: Insufficient documentation

## 2017-12-20 DIAGNOSIS — Z79899 Other long term (current) drug therapy: Secondary | ICD-10-CM | POA: Insufficient documentation

## 2017-12-20 DIAGNOSIS — T7840XA Allergy, unspecified, initial encounter: Secondary | ICD-10-CM | POA: Insufficient documentation

## 2017-12-20 LAB — BASIC METABOLIC PANEL
Anion gap: 10 (ref 5–15)
BUN: 17 mg/dL (ref 6–20)
CALCIUM: 9.1 mg/dL (ref 8.9–10.3)
CHLORIDE: 109 mmol/L (ref 98–111)
CO2: 21 mmol/L — ABNORMAL LOW (ref 22–32)
Creatinine, Ser: 1.35 mg/dL — ABNORMAL HIGH (ref 0.61–1.24)
Glucose, Bld: 95 mg/dL (ref 70–99)
POTASSIUM: 3.6 mmol/L (ref 3.5–5.1)
SODIUM: 140 mmol/L (ref 135–145)

## 2017-12-20 LAB — CBC WITH DIFFERENTIAL/PLATELET
BASOS ABS: 0 10*3/uL (ref 0–0.1)
BASOS PCT: 0 %
Eosinophils Absolute: 0.4 10*3/uL (ref 0–0.7)
Eosinophils Relative: 4 %
HEMATOCRIT: 47.7 % (ref 40.0–52.0)
HEMOGLOBIN: 16.2 g/dL (ref 13.0–18.0)
LYMPHS PCT: 12 %
Lymphs Abs: 1.3 10*3/uL (ref 1.0–3.6)
MCH: 30.5 pg (ref 26.0–34.0)
MCHC: 34 g/dL (ref 32.0–36.0)
MCV: 89.6 fL (ref 80.0–100.0)
Monocytes Absolute: 0.9 10*3/uL (ref 0.2–1.0)
Monocytes Relative: 9 %
NEUTROS ABS: 8.1 10*3/uL — AB (ref 1.4–6.5)
NEUTROS PCT: 75 %
Platelets: 231 10*3/uL (ref 150–440)
RBC: 5.32 MIL/uL (ref 4.40–5.90)
RDW: 12.8 % (ref 11.5–14.5)
WBC: 10.8 10*3/uL — AB (ref 3.8–10.6)

## 2017-12-20 MED ORDER — DEXAMETHASONE SODIUM PHOSPHATE 10 MG/ML IJ SOLN
10.0000 mg | Freq: Once | INTRAMUSCULAR | Status: AC
  Administered 2017-12-20: 10 mg via INTRAMUSCULAR
  Filled 2017-12-20: qty 1

## 2017-12-20 MED ORDER — RANITIDINE HCL 150 MG PO TABS: 150.0000 mg | ORAL_TABLET | Freq: Two times a day (BID) | ORAL | 0 refills | Status: DC

## 2017-12-20 MED ORDER — FAMOTIDINE IN NACL 20-0.9 MG/50ML-% IV SOLN
20.0000 mg | Freq: Once | INTRAVENOUS | Status: AC
  Administered 2017-12-20: 20 mg via INTRAVENOUS
  Filled 2017-12-20: qty 50

## 2017-12-20 MED ORDER — PREDNISONE 10 MG PO TABS: 10.0000 mg | ORAL_TABLET | Freq: Two times a day (BID) | ORAL | 0 refills | Status: DC

## 2017-12-20 MED ORDER — CYPROHEPTADINE HCL 4 MG PO TABS: 4.0000 mg | ORAL_TABLET | Freq: Three times a day (TID) | ORAL | 0 refills | Status: DC | PRN

## 2017-12-20 MED ORDER — DIPHENHYDRAMINE HCL 50 MG/ML IJ SOLN
50.0000 mg | Freq: Once | INTRAMUSCULAR | Status: AC
Start: 2017-12-20 — End: 2017-12-20
  Administered 2017-12-20: 50 mg via INTRAVENOUS
  Filled 2017-12-20: qty 1

## 2017-12-20 NOTE — ED Notes (Signed)
See triage note  Presents with both hands swelling and painful  Hx of eczema  States he noticed a few areas on Saturday  But last pm hand became worse  States he usually gets prednisone and antihistamine

## 2017-12-20 NOTE — ED Triage Notes (Signed)
Rash to bilateral hands, swelling and itching. Hx of same. Appeared last night.

## 2017-12-20 NOTE — Discharge Instructions (Addendum)
You have been treated for sudden rash and swelling to the hands primarily. Take the prescription meds as directed. Follow-up with [facility] as needed. Avoid hot showers and excessive heat.

## 2017-12-20 NOTE — ED Notes (Signed)
Pt reporting generalized puritis returning.  PA updated on sx.

## 2017-12-22 NOTE — ED Provider Notes (Signed)
[facility] Emergency Department Provider Note ____________________________________________  Time seen: 1914  I have reviewed the triage vital signs and the nursing notes.  HISTORY  Chief Complaint  Rash  HPI Jake GeraldsMark Pfefferkorn is a 47 y.o. male presents to the ED for evaluation of bilateral hand swelling and itching.  Patient describes a previous episode that occurred 2 to 3 years prior with an unknown trigger or etiology.  He describes being diagnosed with eczema at the time.  He presents now with tightness and itching to the bilateral hands as well as some extension to the elbows.  He denies any difficulty breathing, swallowing, or controlling secretions.  Patient is unaware of any chemical exposures, other known contacts.  He has been taking Benadryl with limited benefit.  Past Medical History:  Diagnosis Date  . Angina at rest [facility](HCC)   . Anxiety   . Attention-deficit/hyperactivity disorder   . Bipolar disorder (HCC)   . Depression   . History of tobacco abuse   . Hypertension   . Hypertrophic cardiomyopathy (HCC)    s/p myomectomy  . Peripheral neuropathy   . PTSD (post-traumatic stress disorder)     Patient Active Problem List   Diagnosis Date Noted  . Chronic pain syndrome 07/14/2016  . Peripheral autonomic neuropathy of unknown cause 11/29/2014  . Cognitive decline 11/23/2014  . History of open heart surgery 11/23/2014  . Chronic LBP 01/25/2013  . Chronic cervical pain 01/25/2013  . Affective bipolar disorder (HCC) 01/25/2013  . BP (high blood pressure) 01/25/2013  . Cardiomyopathy, 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HCC) 01/25/2013  . Long term current use of opiate analgesic 01/25/2013  . Disorder of peripheral nervous system 01/25/2013  . Bipolar affective disorder (HCC) 01/25/2013  . Hypertrophic obstructive cardiomyopathy (HCC) 01/25/2013  . 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 Essential hypertension 11/24/2012  . Chest pain 11/24/2012  .  Tachycardia 09/15/2011  . Edema 01/29/2011  . HOCM (hypertrophic obstructive cardiomyopathy) (HCC) 01/29/2011  . OSA (obstructive sleep apnea) 01/29/2011    Past Surgical History:  Procedure Laterality Date  . CARDIAC CATHETERIZATION     11/14/2010  . CORONARY ARTERY BYPASS GRAFT     for myomectomy for hypertrophic cardiomyopathy    Prior to Admission medications   Medication Sig Start Date End Date Taking? Authorizing Provider  Aspirin-Caffeine (BC FAST PAIN RELIEF PO) Take by mouth as needed.    [provider]  cyproheptadine (PERIACTIN) 4 MG tablet Take 1 tablet (4 mg total) by mouth 3 (three) times daily as needed for allergies. 12/20/17   Gloriajean Okun, Charlesetta IvoryJenise V Bacon, PA-C  dextroamphetamine (DEXTROSTAT) 10 MG tablet Take 1 tablet (10 mg total) by mouth 2 (two) times daily. 11/22/17   Brandy HaleFaheem, Uzma, MD  diphenhydrAMINE (BENADRYL) 25 MG tablet Take 3 tablets (75 mg total) by mouth at bedtime as needed. 01/11/17   Brandy HaleFaheem, Uzma, MD  Melatonin 10 MG CAPS Take 10 mg by mouth at bedtime. 01/11/17   Brandy HaleFaheem, Uzma, MD  predniSONE (DELTASONE) 10 MG tablet Take 1 tablet (10 mg total) by mouth 2 (two) times daily with a meal. 12/20/17   Karcyn Menn, Charlesetta IvoryJenise V Bacon, PA-C  ranitidine (ZANTAC) 150 MG tablet Take 1 tablet (150 mg total) by mouth 2 (two) times daily. 12/20/17   Lurleen Soltero, Charlesetta IvoryJenise V Bacon, PA-C    Allergies Acetaminophen; Bystolic [nebivolol hcl]; Codeine; and Morphine and related  Family History  Problem Relation Age of Onset  . Cancer - Lung Mother   . Skin cancer Mother   .  Alcohol abuse Mother   . Drug abuse Mother   . Stroke Father   . Hypertension Father   . Alcohol abuse Father   . Drug abuse Father   . Drug abuse Sister     Social History Social History   Tobacco Use  . Smoking status: Current Every Day Smoker    Packs/day: 1.00    Years: 15.00    Pack years: 15.00    Types: Cigarettes    Start date: 12/31/1988  . Smokeless tobacco: Never Used  Substance Use Topics   . Alcohol use: No    Alcohol/week: 0.0 oz  . Drug use: No    Review of Systems  Constitutional: Negative for fever. Eyes: Negative for visual changes. ENT: Negative for sore throat. Cardiovascular: Negative for chest pain. Respiratory: Negative for shortness of breath. Gastrointestinal: Negative for abdominal pain, vomiting and diarrhea. Genitourinary: Negative for dysuria. Musculoskeletal: Negative for back pain. Skin: Positive for rash. Neurological: Negative for headaches, focal weakness or numbness. ____________________________________________  PHYSICAL EXAM:  VITAL SIGNS: ED Triage Vitals  Enc Vitals Group     BP 12/20/17 1837 127/88     Pulse Rate 12/20/17 2040 75     Resp 12/20/17 1837 18     Temp 12/20/17 1837 98.8 F (37.1 C)     Temp Source 12/20/17 1837 Oral     SpO2 12/20/17 1837 100 %     Weight 12/20/17 1838 220 lb (99.8 kg)     Height 12/20/17 1838 5\' 11"  (1.803 m)     Head Circumference --      Peak Flow --      Pain Score 12/20/17 1838 8     Pain Loc --      Pain Edu? --      Excl. in GC? --     Constitutional: Alert and oriented. Well appearing and in no distress. Head: Normocephalic and atraumatic. Eyes: Conjunctivae are normal. Normal extraocular movements Nose: No congestion/rhinorrhea/epistaxis. Mouth/Throat: Mucous membranes are moist. Hematological/Lymphatic/Immunological: No cervical lymphadenopathy. Cardiovascular: Normal rate, regular rhythm. Normal distal pulses. Respiratory: Normal respiratory effort. No wheezes/rales/rhonchi. Musculoskeletal: Nontender with normal range of motion in all extremities.  Neurologic:  Normal gait without ataxia. Normal speech and language. No gross focal neurologic deficits are appreciated. Skin:  Skin is warm, dry and intact.  Patient with bilateral hand edema and scattered maculopapular lesions to the dorsal aspect.  There is some mottling noted to the palms without any frank palpable lesions.  There is  no appearance of any wound dehiscence, cracks or fissures to the hands.  The elbow show some mild erythema bilaterally. ___________________________________________  PROCEDURES  Procedures Famotidine 20 mg IVPB Diphenhydramine 50 mg IVP Dexamethasone, 10 mg IVP ____________________________________________  INITIAL IMPRESSION / ASSESSMENT AND PLAN / ED COURSE  Patient with ED evaluation of a allergic reaction to the hands with unknown etiology.  Patient with a remote history of eczema, presents with sudden onset of hand itching and swelling.  He denies any other symptoms at this time.  Patient is resolved nearly completely, after IV medication administration.  He will be discharged with prescriptions for Periactin as well as ranitidine to dose as directed.  He will follow-up with the local community clinic for ongoing symptom management. ____________________________________________  FINAL CLINICAL IMPRESSION(S) / ED DIAGNOSES  Final diagnoses:  Rash  Allergic reaction, initial encounter      Lissa Hoard, PA-C 12/22/17 1837    Phineas Semen, MD 12/23/17 1611

## 2018-01-24 ENCOUNTER — Other Ambulatory Visit: Payer: Self-pay

## 2018-01-24 ENCOUNTER — Ambulatory Visit (INDEPENDENT_AMBULATORY_CARE_PROVIDER_SITE_OTHER): Payer: Self-pay | Admitting: Psychiatry

## 2018-01-24 ENCOUNTER — Encounter: Payer: Self-pay | Admitting: Psychiatry

## 2018-01-24 VITALS — BP 173/104 | HR 111 | Temp 98.4°F | Wt 225.8 lb

## 2018-01-24 DIAGNOSIS — F39 Unspecified mood [affective] disorder: Secondary | ICD-10-CM

## 2018-01-24 DIAGNOSIS — F902 Attention-deficit hyperactivity disorder, combined type: Secondary | ICD-10-CM

## 2018-01-24 MED ORDER — DEXTROAMPHETAMINE SULFATE 10 MG PO TABS: 10.0000 mg | ORAL_TABLET | Freq: Two times a day (BID) | ORAL | 0 refills | Status: DC

## 2018-01-24 NOTE — Progress Notes (Signed)
BH MD/PA/NP OP Progress Note  01/24/2018 1:26 PM Jake GeraldsMark Elliott  MRN:  [id number]  Subjective:  Patient is a 47 year-old male with history of ADD and mood symptoms who presented for the follow-up appointment.  His blood pressure and pulse are significantly elevated.  He stated that he went to the emergency room last month as he had a rash.  He stated that his blood pressure was normal at that time.  He has history of hypertrophic cardiomyopathy and he stated that the only medication which really helps with his blood pressure is cialis.  He is unable to get a prescription for the same.   He stated that he has decreased the use of caffeinated drinks and beverages.  He does not feel is stressed out at this time.  [place] and is optimistic about the same.  His blood pressure remains elevated at baseline.  He has been compliant with his medications.  He denied having any suicidal homicidal ideations or plans.  He denied having any perceptual disturbances.  Patient appears calm and alert during the interview.  No acute issues noted at this time.              Chief Complaint:  Chief Complaint    Follow-up; Medication Refill     Visit Diagnosis:     ICD-10-CM   1. Attention deficit hyperactivity disorder (ADHD), combined type F90.2   2. Episodic mood disorder (HCC) F39     Past Medical History:  Past Medical History:  Diagnosis Date  . Angina at rest [facility](HCC)   . Anxiety   . Attention-deficit/hyperactivity disorder   . Bipolar disorder (HCC)   . Depression   . History of tobacco abuse   . Hypertension   . Hypertrophic cardiomyopathy (HCC)    s/p myomectomy  . Peripheral neuropathy   . PTSD (post-traumatic stress disorder)     Past Surgical History:  Procedure Laterality Date  . CARDIAC CATHETERIZATION     11/14/2010  . CORONARY ARTERY BYPASS GRAFT     for myomectomy for hypertrophic cardiomyopathy   Family History:  Family History  Problem Relation  Age of Onset  . Cancer - Lung Mother   . Skin cancer Mother   . Alcohol abuse Mother   . Drug abuse Mother   . Stroke Father   . Hypertension Father   . Alcohol abuse Father   . Drug abuse Father   . Drug abuse Sister    Social History:  Social History   Socioeconomic History  . Marital status: Single    Spouse name: Not on file  . Number of children: Not on file  . Years of education: Not on file  . Highest education level: Not on file  Occupational History  . Not on file  Social Needs  . Financial resource strain: Not on file  . Food insecurity:    Worry: Not on file    Inability: Not on file  . Transportation needs:    Medical: Not on file    Non-medical: Not on file  Tobacco Use  . Smoking status: Current Every Day Smoker    Packs/day: 1.00    Years: 15.00    Pack years: 15.00    Types: Cigarettes    Start date: 12/31/1988  . Smokeless tobacco: Never Used  Substance and Sexual Activity  . Alcohol use: No    Alcohol/week: 0.0 standard drinks  . Drug use: No  . Sexual  activity: Yes    Partners: Female    Birth control/protection: None  Lifestyle  . Physical activity:    Days per week: Not on file    Minutes per session: Not on file  . Stress: Not on file  Relationships  . Social connections:    Talks on phone: Not on file    Gets together: Not on file    Attends religious service: Not on file    Active member of club or organization: Not on file    Attends meetings of clubs or organizations: Not on file    Relationship status: Not on file  Other Topics Concern  . Not on file  Social History Narrative  . Not on file   Additional History:  He currently lives with his father.  Assessment:   Musculoskeletal:   Psychiatric Specialty Exam: Medication Refill   Anxiety       ROS  Blood pressure (!) 173/104, pulse (!) 111, temperature 98.4 F (36.9 C), temperature source Oral, weight 225 lb 12.8 oz (102.4 kg).Body mass index is 31.49 kg/m.   General Appearance: Casual  Eye Contact:  Good  Speech:  Clear and Coherent  Volume:  Normal  Mood:  Anxious  Affect:  Appropriate  Thought Process:  Goal Directed and Intact  Orientation:  Full (Time, Place, and Person)  Thought Content:  WDL  Suicidal Thoughts:  No  Homicidal Thoughts:  No  Memory:  Immediate;   Fair  Judgement:  Fair  Insight:  Fair  Psychomotor Activity:  Normal  Concentration:  Fair  Recall:  [employer]Fair  Fund of Knowledge: Fair  Language: Fair  Akathisia:  No  Handed:  Right  AIMS (if indicated):    Assets:  Communication Skills Desire for Improvement Physical Health Social Support  ADL's:  Intact  Cognition: WNL  Sleep:     Current Medications: Current Outpatient Medications  Medication Sig Dispense Refill  . Aspirin-Caffeine (BC FAST PAIN RELIEF PO) Take by mouth as needed.    . cyproheptadine (PERIACTIN) 4 MG tablet Take 1 tablet (4 mg total) by mouth 3 (three) times daily as needed for allergies. 30 tablet 0  . dextroamphetamine (DEXTROSTAT) 10 MG tablet Take 1 tablet (10 mg total) by mouth 2 (two) times daily. 60 tablet 0  . diphenhydrAMINE (BENADRYL) 25 MG tablet Take 3 tablets (75 mg total) by mouth at bedtime as needed. 90 tablet 1  . Melatonin 10 MG CAPS Take 10 mg by mouth at bedtime. 30 capsule 1  . predniSONE (DELTASONE) 10 MG tablet Take 1 tablet (10 mg total) by mouth 2 (two) times daily with a meal. 10 tablet 0  . ranitidine (ZANTAC) 150 MG tablet Take 1 tablet (150 mg total) by mouth 2 (two) times daily. 20 tablet 0   No current facility-administered medications for this visit.     Medical Decision Making:  Review of Psycho-Social Stressors (1)  Treatment Plan Summary:Medication management   Continue Dexedrine 10 mg p.o. twice daily.  He was given 6479-month supply of the medications.   He  will continue with his vitamins and other medications as prescribed.    Follow-up in 2 months or earlier depending on his symptoms.    Discussed  with him about side effects of medication and he demonstrated understanding.    More than 50% of the time spent in psychoeducation, counseling and coordination of care.     This note was generated in part or whole with voice recognition software. Voice  regonition is usually quite accurate but there are transcription errors that can and very often do occur. I apologize for any typographical errors that were not detected and corrected.    Brandy Hale, MD  01/24/2018, 1:26 PM

## 2018-03-13 NOTE — Progress Notes (Signed)
Cardiology Office Note  Date:  03/14/2018   ID:  Sherard Sutch, DOB 01-29-1971, MRN [id number]  PCP:  Patient, No Pcp Per   Chief Complaint  Patient presents with  . other    6 month follow up. Meds reviewed by the pt. verbally. Pt. c/o rapid heart beats, shortness of breath and occas. chest pain off & on.     HPI:  Mr. Gatliff is a very pleasant 47 year old gentleman who has a history of  HOCM, myomectomy in 2008 ,  Follow-up echocardiogram with moderate LVH,  cardiac catheterization June 2012 showing no significant coronary disease,  history of sleep apnea, unable to tolerate CPAP,   nonsustained VT. Prior history of heavy opiate use for chronic pain who presents for routine followup of his tachycardia and hypertension  On his last clinic visit was not taking medication such as verapamil Previously had trouble on beta-blockers Leading his blood pressure and heart rate run high reports he is asymptomatic  Reports he feels the best when he takes Cialis 10 mg daily Has been getting this generic  Feels it makes him do better Previously able to tolerate verapamil but did not think it was doing anything   taking medication for ADHD   he does not have insurance, Does not want to go to medical management, [facility] or [place] [facility]  Continues to have anxiety, tightness in his back, palpitations at night that shake his body Significant fatigue, headaches  Previously reported that he takes 15 ibuprofen and numerous aspirins per day for chronic pain problem with his thyroid and his prolactin in the past  EKG personally reviewed by myself on todays visit Shows LVH, sinus tachycardia rate 104 bpm no significant changes compared to previous EKG  Other past medical history reviewed  Previously was on calcium channel blockers and beta blockers. 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   history of anxiety depression. He has had a long hospital  course requiring assistance from psychiatry.   chronic pain syndrome.   Prior Holter monitor in 2013 showing normal sinus rhythm with APCs and PVCs   PMH:   has a past medical history of Angina at rest [facility]), Anxiety, Attention-deficit/hyperactivity disorder, Bipolar disorder (HCC), Depression, History of tobacco abuse, Hypertension, Hypertrophic cardiomyopathy (HCC), Peripheral neuropathy, and PTSD (post-traumatic stress disorder).  PSH:    Past Surgical History:  Procedure Laterality Date  . CARDIAC CATHETERIZATION     11/14/2010  . CORONARY ARTERY BYPASS GRAFT     for myomectomy for hypertrophic cardiomyopathy    Current Outpatient Medications  Medication Sig Dispense Refill  . Aspirin-Caffeine (BC FAST PAIN RELIEF PO) Take by mouth as needed.    [name] dextroamphetamine (DEXTROSTAT) 10 MG tablet Take 1 tablet (10 mg total) by mouth 2 (two) times daily. 60 tablet 0  . diphenhydrAMINE (BENADRYL) 25 MG tablet Take 3 tablets (75 mg total) by mouth at bedtime as needed. 90 tablet 1  . Melatonin 10 MG CAPS Take 10 mg by mouth at bedtime. 30 capsule 1  . amLODipine (NORVASC) 10 MG tablet Take 1 tablet (10 mg total) by mouth daily. 30 tablet 11  . tadalafil (CIALIS) 20 MG tablet Take 1 tablet (20 mg total) by mouth daily as needed for erectile dysfunction. 30 tablet 6   No current facility-administered medications for this visit.      Allergies:   Acetaminophen; Bystolic [nebivolol hcl]; Codeine; and Morphine and related   Social History:  The patient  reports that he has been smoking cigarettes. He started smoking about 29 years ago. He has a 15.00 pack-year smoking history. He has never used smokeless tobacco. He reports that he does not drink alcohol or use drugs.   Family History:   family history includes Alcohol abuse in his father and mother; Cancer - Lung in his mother; Drug abuse in his father, mother, and sister; Hypertension in his father; Skin cancer in his mother; Stroke in his  father.    Review of Systems: Review of Systems  Constitutional: Negative.   Respiratory: Negative.   Cardiovascular: Positive for palpitations.       Tachycardia  Gastrointestinal: Negative.   Musculoskeletal: Negative.   Neurological: Negative.   Psychiatric/Behavioral: Positive for depression. The patient is nervous/anxious.   All other systems reviewed and are negative.   PHYSICAL EXAM: VS:  BP (!) 186/101 (BP Location: Left Arm, Patient Position: Sitting, Cuff Size: Normal)   Pulse (!) 104   Ht 5\' 11"  (1.803 m)   Wt 224 lb 8 oz (101.8 kg)   BMI 31.31 kg/m  , BMI Body mass index is 31.31 kg/m. Constitutional:  oriented to person, place, and time. No distress.  HENT:  Head: Normocephalic and atraumatic.  Eyes:  no discharge. No scleral icterus.  Neck: Normal range of motion. Neck supple. No JVD present.  Cardiovascular: Tachycardic but regular , normal heart sounds and intact distal pulses. Exam reveals no gallop and no friction rub. No edema No murmur heard. Pulmonary/Chest: Effort normal and breath sounds normal. No stridor. No respiratory distress.  no wheezes.  no rales.  no tenderness.  Abdominal: Soft.  no distension.  no tenderness.  Musculoskeletal: Normal range of motion.  no  tenderness or deformity.  Neurological:  normal muscle tone. Coordination normal. No atrophy Skin: Skin is warm and dry. No rash noted. not diaphoretic.  Psychiatric: Anxious. behavior is normal. Thought content normal.    Recent Labs: 12/20/2017: BUN 17; Creatinine, Ser 1.35; Hemoglobin 16.2; Platelets 231; Potassium 3.6; Sodium 140    Lipid Panel Lab Results  Component Value Date   CHOL 261 (H) 07/15/2016   HDL 53 07/15/2016   LDLCALC 175 (H) 07/15/2016   TRIG 165 (H) 07/15/2016    Wt Readings from Last 3 Encounters:  03/14/18 224 lb 8 oz (101.8 kg)  12/20/17 220 lb (99.8 kg)  07/14/16 208 lb 4 oz (94.5 kg)     ASSESSMENT AND PLAN:  Frequent headaches -  Takes large  amounts of NSAIDs for his symptoms Does not have primary care Possibly tension headaches Recommend we work on his blood pressure  Hyperlipidemia, unspecified hyperlipidemia type - Does not want a statin We did suggest Zetia but he has declined at this time.  Recommended he call us if he would like to try Zetia in the future  Fatigue, unspecified type -  Chronic issue  Hypertrophic obstructive cardiomyopathy (HCC) - Symptoms are relatively stable, discussed previous myomectomy with him Recommend we work on his blood pressure He does not want beta-blocker, diltiazem or verapamil He is willing to try amlodipine  Essential hypertension He would like to stay on Cialis but this is not a conventional blood pressure medication.  We have discussed this with him but he reports it makes him feel better Recommended he add amlodipine 10 mg daily  Tachycardia He does not want beta blockers at this time Does not feel that verapamil is doing anything  Chronic pain syndrome  long discussion concerning his previous  history Was previously on high-dose narcotics Now takes large amounts of NSAIDs including aspirin, Aleve   Total encounter time more than 25 minutes  Greater than 50% was spent in counseling and coordination of care with the patient   Disposition:   F/U  12 months   Orders Placed This Encounter  Procedures  . EKG 12-Lead     Signed, Dossie Arbour, M.D., Ph.D. 03/14/2018  [facility] [place], [place] [phone]

## 2018-03-14 ENCOUNTER — Ambulatory Visit (INDEPENDENT_AMBULATORY_CARE_PROVIDER_SITE_OTHER): Payer: Self-pay | Admitting: Cardiovascular Disease

## 2018-03-14 ENCOUNTER — Encounter: Payer: Self-pay | Admitting: Cardiovascular Disease

## 2018-03-14 VITALS — BP 186/101 | HR 104 | Ht 71.0 in | Wt 224.5 lb

## 2018-03-14 DIAGNOSIS — I421 Obstructive hypertrophic cardiomyopathy: Secondary | ICD-10-CM

## 2018-03-14 DIAGNOSIS — R079 Chest pain, unspecified: Secondary | ICD-10-CM

## 2018-03-14 DIAGNOSIS — R609 Edema, unspecified: Secondary | ICD-10-CM

## 2018-03-14 DIAGNOSIS — R000 Tachycardia, unspecified: Secondary | ICD-10-CM

## 2018-03-14 DIAGNOSIS — F319 Bipolar disorder, unspecified: Secondary | ICD-10-CM

## 2018-03-14 MED ORDER — AMLODIPINE BESYLATE 10 MG PO TABS: 10.0000 mg | ORAL_TABLET | Freq: Every day | ORAL | 11 refills | Status: DC

## 2018-03-14 MED ORDER — TADALAFIL 20 MG PO TABS: 20.0000 mg | ORAL_TABLET | Freq: Every day | ORAL | 6 refills | Status: DC | PRN

## 2018-03-14 NOTE — Patient Instructions (Addendum)
Medication Instructions:   Please start amloidpine one a day Ok to take generic cialis  Call me with some pressures  Labwork:  No new labs needed  Testing/Procedures:  No further testing at this time   Follow-Up: It was a pleasure seeing you in the office today. Please call us if you have new issues that need to be addressed before your next appt.  [phone]  Your physician wants you to follow-up in: 12 months.  You will receive a reminder letter in the mail two months in advance. If you don't receive a letter, please call our office to schedule the follow-up appointment.  If you need a refill on your cardiac medications before your next appointment, please call your pharmacy.  For educational health videos Log in to : [email or web address] Or : [email or web address], password : [email or web address]

## 2018-03-21 ENCOUNTER — Ambulatory Visit (INDEPENDENT_AMBULATORY_CARE_PROVIDER_SITE_OTHER): Payer: Self-pay | Admitting: Psychiatry

## 2018-03-21 ENCOUNTER — Other Ambulatory Visit: Payer: Self-pay

## 2018-03-21 ENCOUNTER — Encounter: Payer: Self-pay | Admitting: Psychiatry

## 2018-03-21 VITALS — BP 135/80 | HR 99 | Temp 97.6°F | Wt 224.2 lb

## 2018-03-21 DIAGNOSIS — F39 Unspecified mood [affective] disorder: Secondary | ICD-10-CM

## 2018-03-21 DIAGNOSIS — F902 Attention-deficit hyperactivity disorder, combined type: Secondary | ICD-10-CM

## 2018-03-21 MED ORDER — DEXTROAMPHETAMINE SULFATE 10 MG PO TABS: 10.0000 mg | ORAL_TABLET | Freq: Two times a day (BID) | ORAL | 0 refills | Status: DC

## 2018-03-21 NOTE — Progress Notes (Signed)
BH MD/PA/NP OP Progress Note  03/21/2018 1:20 PM Jake Elliott  MRN:  [id number]  Subjective:  Patient is a 47 year-old male with history of ADD and mood symptoms who presented for the follow-up appointment.  He reported that he was recently started on Cilas and amlodipine for his blood pressure and has been doing well.  He stated that he went to the cardiologist and they started him on the medications.  He is doing better.  He stated that the medications are helping him.  He is also planning to go back to [place] for his job interview as they called him when he was returning back after his first interview.  Patient reported that the medications loosely help him.  He has been compliant with his medications.  We discussed about his medications in detail.  Patient appeared calm and alert during the interview.  He reported that he had the manic episode last month but he was able to control his symptoms.  He did not require any hospitalizations.  He remains at his baseline.  No new medications are needed at this time.  He does not want to try any new psychotropic medications due to prior history of noncompliance and the medications are not effective.       Patient appears calm and alert during the interview.  No acute issues noted at this time.              Chief Complaint:  Chief Complaint    Follow-up; Medication Refill     Visit Diagnosis:     ICD-10-CM   1. Attention deficit hyperactivity disorder (ADHD), combined type F90.2   2. Episodic mood disorder (HCC) F39     Past Medical History:  Past Medical History:  Diagnosis Date  . Angina at rest [facility])   . Anxiety   . Attention-deficit/hyperactivity disorder   . Bipolar disorder (HCC)   . Depression   . History of tobacco abuse   . Hypertension   . Hypertrophic cardiomyopathy (HCC)    s/p myomectomy  . Peripheral neuropathy   . PTSD (post-traumatic stress disorder)     Past Surgical History:  Procedure Laterality Date  .  CARDIAC CATHETERIZATION     11/14/2010  . CORONARY ARTERY BYPASS GRAFT     for myomectomy for hypertrophic cardiomyopathy   Family History:  Family History  Problem Relation Age of Onset  . Cancer - Lung Mother   . Skin cancer Mother   . Alcohol abuse Mother   . Drug abuse Mother   . Stroke Father   . Hypertension Father   . Alcohol abuse Father   . Drug abuse Father   . Drug abuse Sister    Social History:  Social History   Socioeconomic History  . Marital status: Single    Spouse name: Not on file  . Number of children: Not on file  . Years of education: Not on file  . Highest education level: Not on file  Occupational History  . Not on file  Social Needs  . Financial resource strain: Not on file  . Food insecurity:    Worry: Not on file    Inability: Not on file  . Transportation needs:    Medical: Not on file    Non-medical: Not on file  Tobacco Use  . Smoking status: Current Every Day Smoker    Packs/day: 1.00    Years: 15.00    Pack years: 15.00    Types: Cigarettes  Start date: 12/31/1988  . Smokeless tobacco: Never Used  Substance and Sexual Activity  . Alcohol use: No    Alcohol/week: 0.0 standard drinks  . Drug use: No  . Sexual activity: Yes    Partners: Female    Birth control/protection: None  Lifestyle  . Physical activity:    Days per week: Not on file    Minutes per session: Not on file  . Stress: Not on file  Relationships  . Social connections:    Talks on phone: Not on file    Gets together: Not on file    Attends religious service: Not on file    Active member of club or organization: Not on file    Attends meetings of clubs or organizations: Not on file    Relationship status: Not on file  Other Topics Concern  . Not on file  Social History Narrative  . Not on file   Additional History:  He currently lives with his father.  Assessment:   Musculoskeletal:   Psychiatric Specialty Exam: Medication Refill   Anxiety        ROS  Blood pressure 135/80, pulse 99, temperature 97.6 F (36.4 C), temperature source Oral, weight 224 lb 3.2 oz (101.7 kg).Body mass index is 31.27 kg/m.  General Appearance: Casual  Eye Contact:  Good  Speech:  Clear and Coherent  Volume:  Normal  Mood:  Anxious  Affect:  Appropriate  Thought Process:  Goal Directed and Intact  Orientation:  Full (Time, Place, and Person)  Thought Content:  WDL  Suicidal Thoughts:  No  Homicidal Thoughts:  No  Memory:  Immediate;   Fair  Judgement:  Fair  Insight:  Fair  Psychomotor Activity:  Normal  Concentration:  Fair  Recall:  [employer] of Knowledge: Fair  Language: Fair  Akathisia:  No  Handed:  Right  AIMS (if indicated):    Assets:  Communication Skills Desire for Improvement Physical Health Social Support  ADL's:  Intact  Cognition: WNL  Sleep:     Current Medications: Current Outpatient Medications  Medication Sig Dispense Refill  . amLODipine (NORVASC) 10 MG tablet Take 1 tablet (10 mg total) by mouth daily. 30 tablet 11  . Aspirin-Caffeine (BC FAST PAIN RELIEF PO) Take by mouth as needed.    [name] dextroamphetamine (DEXTROSTAT) 10 MG tablet Take 1 tablet (10 mg total) by mouth 2 (two) times daily. 60 tablet 0  . diphenhydrAMINE (BENADRYL) 25 MG tablet Take 3 tablets (75 mg total) by mouth at bedtime as needed. 90 tablet 1  . Melatonin 10 MG CAPS Take 10 mg by mouth at bedtime. 30 capsule 1  . tadalafil (CIALIS) 20 MG tablet Take 1 tablet (20 mg total) by mouth daily as needed for erectile dysfunction. 30 tablet 6   No current facility-administered medications for this visit.     Medical Decision Making:  Review of Psycho-Social Stressors (1)  Treatment Plan Summary:Medication management   Continue Dexedrine 10 mg p.o. twice daily.  He was given 6-month supply of the medications.   He  will continue with his vitamins and other medications as prescribed.    Follow-up in 2 months or earlier depending on his  symptoms.    Discussed with him about side effects of medication and he demonstrated understanding.    More than 50% of the time spent in psychoeducation, counseling and coordination of care.     This note was generated in part or whole with voice recognition  software. Voice regonition is usually quite accurate but there are transcription errors that can and very often do occur. I apologize for any typographical errors that were not detected and corrected.    Brandy Hale, MD  03/21/2018, 1:20 PM

## 2018-03-24 ENCOUNTER — Telehealth: Payer: Self-pay | Admitting: Cardiovascular Disease

## 2018-03-24 NOTE — Telephone Encounter (Signed)
Pt is calling with BP readings: 10/7-135/80 Today -128/80 Pt states his arm is too big to take at local pharmacies.

## 2018-03-24 NOTE — Telephone Encounter (Signed)
Spoke with patient and reviewed blood pressure readings and normal ranges. Reviewed that if blood pressures consistently remain 140/90 or higher then we would need to know for possible medication adjustments. He verbalized understanding with no further questions at this time.

## 2018-03-28 ENCOUNTER — Ambulatory Visit: Payer: Self-pay | Admitting: Psychiatry

## 2018-05-09 ENCOUNTER — Ambulatory Visit (INDEPENDENT_AMBULATORY_CARE_PROVIDER_SITE_OTHER): Payer: Self-pay | Admitting: Psychiatry

## 2018-05-09 ENCOUNTER — Other Ambulatory Visit: Payer: Self-pay

## 2018-05-09 ENCOUNTER — Encounter: Payer: Self-pay | Admitting: Psychiatry

## 2018-05-09 ENCOUNTER — Ambulatory Visit: Payer: Self-pay | Admitting: Psychiatry

## 2018-05-09 VITALS — BP 154/85 | HR 95 | Temp 98.1°F | Wt 221.2 lb

## 2018-05-09 DIAGNOSIS — F39 Unspecified mood [affective] disorder: Secondary | ICD-10-CM

## 2018-05-09 DIAGNOSIS — F902 Attention-deficit hyperactivity disorder, combined type: Secondary | ICD-10-CM

## 2018-05-09 MED ORDER — DEXTROAMPHETAMINE SULFATE 10 MG PO TABS: 10.0000 mg | ORAL_TABLET | Freq: Two times a day (BID) | ORAL | 0 refills | Status: DC

## 2018-05-09 NOTE — Progress Notes (Signed)
BH MD OP Progress Note  05/09/2018 5:52 PM Jake Elliott  MRN:  [id number]  Chief Complaint: ' I am here for follow up.' Chief Complaint    Follow-up; Medication Refill     HPI: Jake Elliott is a 47 year old Caucasian male, employed, lives in [place], has a history of ADHD, mood disorder, anger management problems, obstructive sleep apnea, cardiac problems, hypertension, presented to the clinic today for a follow-up visit.  He is Dr. Micki Elliott patient.    Patient today reports he is currently doing well with regards to his attention and focus problems.  He continues to take Dextrostat as prescribed.  He denies any significant side effects from the same.  Patient continues to have some mood symptoms on and off.  Patient reports a history of anger management problems.  Patient did not express any suicidality or homicidality.  Discussed starting on medication as needed however patient declines.  He reports he has tried and failed several medications in the past and is not interested at this time.  Pt looks forward to Thanksgiving holidays and plans to spend it with his family.   Visit Diagnosis:    ICD-10-CM   1. Attention deficit hyperactivity disorder (ADHD), combined type F90.2   2. Episodic mood disorder (HCC) F39     Past Psychiatric History: I have reviewed past psychiatric history from my progress note on 06/02/2017.  Also reviewed Dr. Micki Elliott notes from 03/21/2018  Past Medical History:  Past Medical History:  Diagnosis Date  . Angina at rest [facility])   . Anxiety   . Attention-deficit/hyperactivity disorder   . Bipolar disorder (HCC)   . Depression   . History of tobacco abuse   . Hypertension   . Hypertrophic cardiomyopathy (HCC)    s/p myomectomy  . Peripheral neuropathy   . PTSD (post-traumatic stress disorder)     Past Surgical History:  Procedure Laterality Date  . CARDIAC CATHETERIZATION     11/14/2010  . CORONARY ARTERY BYPASS GRAFT     for myomectomy for hypertrophic  cardiomyopathy    Family Psychiatric History: Have reviewed family psychiatric history from my progress note on 06/02/2017.  Reviewed Dr. Micki Elliott notes from 03/21/2018  Family History:  Family History  Problem Relation Age of Onset  . Cancer - Lung Mother   . Skin cancer Mother   . Alcohol abuse Mother   . Drug abuse Mother   . Stroke Father   . Hypertension Father   . Alcohol abuse Father   . Drug abuse Father   . Drug abuse Sister     Social History: I have reviewed social history from my progress note on 06/02/2017 Social History   Socioeconomic History  . Marital status: Single    Spouse name: Not on file  . Number of children: Not on file  . Years of education: Not on file  . Highest education level: Not on file  Occupational History  . Not on file  Social Needs  . Financial resource strain: Not on file  . Food insecurity:    Worry: Not on file    Inability: Not on file  . Transportation needs:    Medical: Not on file    Non-medical: Not on file  Tobacco Use  . Smoking status: Current Every Day Smoker    Packs/day: 1.00    Years: 15.00    Pack years: 15.00    Types: Cigarettes    Start date: 12/31/1988  . Smokeless tobacco: Never Used  Substance and Sexual  Activity  . Alcohol use: No    Alcohol/week: 0.0 standard drinks  . Drug use: No  . Sexual activity: Yes    Partners: Female    Birth control/protection: None  Lifestyle  . Physical activity:    Days per week: Not on file    Minutes per session: Not on file  . Stress: Not on file  Relationships  . Social connections:    Talks on phone: Not on file    Gets together: Not on file    Attends religious service: Not on file    Active member of club or organization: Not on file    Attends meetings of clubs or organizations: Not on file    Relationship status: Not on file  Other Topics Concern  . Not on file  Social History Narrative  . Not on file    Allergies:  Allergies  Allergen Reactions   . Acetaminophen Other (See Comments)    headache  . Bystolic [Nebivolol Hcl]     Headaches  . Codeine     Nausea & headache Other reaction(s): HEADACHE  . Morphine And Related     Nausea & headache.    Metabolic Disorder Labs: No results found for: HGBA1C, MPG Lab Results  Component Value Date   PROLACTIN 6.1 07/15/2016   Lab Results  Component Value Date   CHOL 261 (H) 07/15/2016   TRIG 165 (H) 07/15/2016   HDL 53 07/15/2016   CHOLHDL 4.9 07/15/2016   VLDL 33 07/15/2016   LDLCALC 175 (H) 07/15/2016   Lab Results  Component Value Date   TSH 3.553 07/15/2016   TSH 1.80 01/23/2013    Therapeutic Level Labs: No results found for: LITHIUM No results found for: VALPROATE No components found for:  CBMZ  Current Medications: Current Outpatient Medications  Medication Sig Dispense Refill  . amLODipine (NORVASC) 10 MG tablet Take 1 tablet (10 mg total) by mouth daily. 30 tablet 11  . Aspirin-Caffeine (BC FAST PAIN RELIEF PO) Take by mouth as needed.    Melene Muller. [START ON 05/19/2018] dextroamphetamine (DEXTROSTAT) 10 MG tablet Take 1 tablet (10 mg total) by mouth 2 (two) times daily. 60 tablet 0  . diphenhydrAMINE (BENADRYL) 25 MG tablet Take 3 tablets (75 mg total) by mouth at bedtime as needed. 90 tablet 1  . Melatonin 10 MG CAPS Take 10 mg by mouth at bedtime. 30 capsule 1  . tadalafil (CIALIS) 20 MG tablet Take 1 tablet (20 mg total) by mouth daily as needed for erectile dysfunction. 30 tablet 6   No current facility-administered medications for this visit.      Musculoskeletal: Strength & Muscle Tone: within normal limits Gait & Station: normal Patient leans: N/A  Psychiatric Specialty Exam: Review of Systems  Psychiatric/Behavioral: The patient is not nervous/anxious.   All other systems reviewed and are negative.   Blood pressure (!) 154/85, pulse 95, temperature 98.1 F (36.7 C), temperature source Oral, weight 221 lb 3.2 oz (100.3 kg).Body mass index is 30.85  kg/m.  General Appearance: Casual  Eye Contact:  Fair  Speech:  Clear and Coherent  Volume:  Normal  Mood:  Euthymic  Affect:  Congruent  Thought Process:  Goal Directed and Descriptions of Associations: Intact  Orientation:  Full (Time, Place, and Person)  Thought Content: Logical   Suicidal Thoughts:  No  Homicidal Thoughts:  No  Memory:  Immediate;   Fair Recent;   Fair Remote;   Fair  Judgement:  Fair  Insight:  Fair  Psychomotor Activity:  Normal  Concentration:  Concentration: Fair and Attention Span: Fair  Recall:  [employer] of Knowledge: Fair  Language: Fair  Akathisia:  No  Handed:  Right  AIMS (if indicated): denies tremors,rigidity,stiffness  Assets:  Communication Skills Desire for Improvement Social Support  ADL's:  Intact  Cognition: WNL  Sleep:  Fair   Screenings: AUDIT     [facility] Visit from 06/25/2015 in [facility]  Alcohol Use Disorder Identification Test Final Score (AUDIT)  0       Assessment and Plan:Quindell is a 47 year old Caucasian male who has a history of ADHD, mood disorder, sleep issues, OSA, heart disease, hypertension, presented to the clinic today for a follow-up visit.  Patient follows up with Dr. Garnetta Buddy.  Patient today reports he continues to do well on the Dextrostat and wants this medication to be continued as it is.  He denies any significant concerns.  His blood pressure today seems to be better than previous visits.  He had recent medication changes with his cardiologist.  Plan as noted below.  Plan For ADHD Dextrostat 10 mg p.o. twice daily I have reviewed notes from [facility] Per Dr. Garnetta Buddy. I have reviewed [place] controlled substance database.  For sleep issues He has OSA, not compliant with CPAP. Declines any medication changes.  For mood disorder He declines medication changes.  He reports he is able to cope.  Follow-up in clinic with Dr. Garnetta Buddy as needed.  More than 50 % of the time was spent for  psychoeducation and supportive psychotherapy and care coordination.  This note was generated in part or whole with voice recognition software. Voice recognition is usually quite accurate but there are transcription errors that can and very often do occur. I apologize for any typographical errors that were not detected and corrected.       Jomarie Longs, MD 05/09/2018, 5:52 PM

## 2018-06-06 ENCOUNTER — Telehealth: Payer: Self-pay

## 2018-06-06 ENCOUNTER — Ambulatory Visit: Payer: Self-pay | Admitting: Psychiatry

## 2018-06-06 ENCOUNTER — Other Ambulatory Visit: Payer: Self-pay | Admitting: Psychiatry

## 2018-06-06 MED ORDER — DEXTROAMPHETAMINE SULFATE 10 MG PO TABS: 10.0000 mg | ORAL_TABLET | Freq: Two times a day (BID) | ORAL | 0 refills | Status: DC

## 2018-06-06 NOTE — Telephone Encounter (Signed)
pt needs refills on medication 

## 2018-06-20 ENCOUNTER — Encounter: Payer: Self-pay | Admitting: Psychiatry

## 2018-06-20 ENCOUNTER — Ambulatory Visit (INDEPENDENT_AMBULATORY_CARE_PROVIDER_SITE_OTHER): Payer: Self-pay | Admitting: Psychiatry

## 2018-06-20 VITALS — BP 163/83 | HR 104 | Ht 70.0 in | Wt 223.0 lb

## 2018-06-20 DIAGNOSIS — F902 Attention-deficit hyperactivity disorder, combined type: Secondary | ICD-10-CM

## 2018-06-20 MED ORDER — DEXTROAMPHETAMINE SULFATE 10 MG PO TABS: 10.0000 mg | ORAL_TABLET | Freq: Two times a day (BID) | ORAL | 0 refills | Status: DC

## 2018-06-20 NOTE — Progress Notes (Signed)
BH MD/PA/NP OP Progress Note  06/20/2018 1:45 PM Jake Elliott  MRN:  [id number]  Subjective:  Patient is a 48 year-old male with history of ADD and mood symptoms who presented for the follow-up appointment.  He reported that he was seen last time by Dr. Elna BreslowEappen  but he does not remember the reason why he came for his earlier appointment.  He stated that he has recently refilled his prescription and continues to take his medications as prescribed.  Patient reported that he does not have any adverse reactions from the medications.  Patient reported that he has been looking for the job as usual.  He reported that he did not do anything over the holidays and spent most of the time at home and was feeling stressed out as he is still unemployed.  He reported that he feels that he might be having some memory issues related to the medications and taking BC powder and Benadryl but he does not want to stop taking the medications.  We discussed about decreasing the dose of BC powder but he reported that he continues to have chronic pain.    He reported that he is also on Cilas and amlodipine for his blood pressure and has been doing well.  His blood pressure remains elevated but he reported that most of the time it is under control.  He denied currently having any suicidal homicidal ideations or plans.  He denied having any perceptual disturbances.  He appeared calm and alert during the interview.       No acute issues noted at this time.    Chief Complaint:   Visit Diagnosis:     ICD-10-CM   1. Attention deficit hyperactivity disorder (ADHD), combined type F90.2     Past Medical History:  Past Medical History:  Diagnosis Date  . Angina at rest [facility](HCC)   . Anxiety   . Attention-deficit/hyperactivity disorder   . Bipolar disorder (HCC)   . Depression   . History of tobacco abuse   . Hypertension   . Hypertrophic cardiomyopathy (HCC)    s/p myomectomy  . Peripheral neuropathy   . PTSD (post-traumatic  stress disorder)     Past Surgical History:  Procedure Laterality Date  . CARDIAC CATHETERIZATION     11/14/2010  . CORONARY ARTERY BYPASS GRAFT     for myomectomy for hypertrophic cardiomyopathy   Family History:  Family History  Problem Relation Age of Onset  . Cancer - Lung Mother   . Skin cancer Mother   . Alcohol abuse Mother   . Drug abuse Mother   . Stroke Father   . Hypertension Father   . Alcohol abuse Father   . Drug abuse Father   . Drug abuse Sister    Social History:  Social History   Socioeconomic History  . Marital status: Single    Spouse name: Not on file  . Number of children: Not on file  . Years of education: Not on file  . Highest education level: Not on file  Occupational History  . Not on file  Social Needs  . Financial resource strain: Not on file  . Food insecurity:    Worry: Not on file    Inability: Not on file  . Transportation needs:    Medical: Not on file    Non-medical: Not on file  Tobacco Use  . Smoking status: Current Every Day Smoker    Packs/day: 1.00    Years: 15.00    Pack  years: 15.00    Types: Cigarettes    Start date: 12/31/1988  . Smokeless tobacco: Never Used  Substance and Sexual Activity  . Alcohol use: No    Alcohol/week: 0.0 standard drinks  . Drug use: No  . Sexual activity: Yes    Partners: Female    Birth control/protection: None  Lifestyle  . Physical activity:    Days per week: Not on file    Minutes per session: Not on file  . Stress: Not on file  Relationships  . Social connections:    Talks on phone: Not on file    Gets together: Not on file    Attends religious service: Not on file    Active member of club or organization: Not on file    Attends meetings of clubs or organizations: Not on file    Relationship status: Not on file  Other Topics Concern  . Not on file  Social History Narrative  . Not on file   Additional History:  He currently lives with his father.  Assessment:    Musculoskeletal:   Psychiatric Specialty Exam: Medication Refill   Anxiety       ROS  Blood pressure (!) 163/83, pulse (!) 104, height 5\' 10"  (1.778 m), weight 223 lb (101.2 kg).Body mass index is 32 kg/m.  General Appearance: Casual  Eye Contact:  Good  Speech:  Clear and Coherent  Volume:  Normal  Mood:  Anxious  Affect:  Appropriate  Thought Process:  Goal Directed and Intact  Orientation:  Full (Time, Place, and Person)  Thought Content:  WDL  Suicidal Thoughts:  No  Homicidal Thoughts:  No  Memory:  Immediate;   Fair  Judgement:  Fair  Insight:  Fair  Psychomotor Activity:  Normal  Concentration:  Fair  Recall:  [employer] of Knowledge: Fair  Language: Fair  Akathisia:  No  Handed:  Right  AIMS (if indicated):    Assets:  Communication Skills Desire for Improvement Physical Health Social Support  ADL's:  Intact  Cognition: WNL  Sleep:     Current Medications: Current Outpatient Medications  Medication Sig Dispense Refill  . amLODipine (NORVASC) 10 MG tablet Take 1 tablet (10 mg total) by mouth daily. 30 tablet 11  . Aspirin-Caffeine (BC FAST PAIN RELIEF PO) Take by mouth as needed.    [name] dextroamphetamine (DEXTROSTAT) 10 MG tablet Take 1 tablet (10 mg total) by mouth 2 (two) times daily. 60 tablet 0  . diphenhydrAMINE (BENADRYL) 25 MG tablet Take 3 tablets (75 mg total) by mouth at bedtime as needed. 90 tablet 1  . Melatonin 10 MG CAPS Take 10 mg by mouth at bedtime. 30 capsule 1  . tadalafil (CIALIS) 20 MG tablet Take 1 tablet (20 mg total) by mouth daily as needed for erectile dysfunction. 30 tablet 6   No current facility-administered medications for this visit.     Medical Decision Making:  Review of Psycho-Social Stressors (1)  Treatment Plan Summary:Medication management   Continue Dexedrine 10 mg p.o. twice daily.  He was given month supply of the medications.   Patient has filled his prescription over the weekend and has 63-month supply of  the medications at this time.   He  will continue with his vitamins and other medications as prescribed.    Follow-up in 2 months or earlier depending on his symptoms.    Discussed with him about side effects of medication and he demonstrated understanding.    More than  50% of the time spent in psychoeducation, counseling and coordination of care.     This note was generated in part or whole with voice recognition software. Voice regonition is usually quite accurate but there are transcription errors that can and very often do occur. I apologize for any typographical errors that were not detected and corrected.    Brandy HaleUzma Glendon Fiser, MD  06/20/2018, 1:45 PM

## 2018-08-08 ENCOUNTER — Ambulatory Visit: Payer: Self-pay | Admitting: Psychiatry

## 2018-08-15 ENCOUNTER — Other Ambulatory Visit: Payer: Self-pay

## 2018-08-15 ENCOUNTER — Encounter: Payer: Self-pay | Admitting: Psychiatry

## 2018-08-15 ENCOUNTER — Ambulatory Visit (INDEPENDENT_AMBULATORY_CARE_PROVIDER_SITE_OTHER): Payer: Self-pay | Admitting: Psychiatry

## 2018-08-15 VITALS — BP 182/80 | HR 109 | Temp 97.9°F | Wt 223.8 lb

## 2018-08-15 DIAGNOSIS — F902 Attention-deficit hyperactivity disorder, combined type: Secondary | ICD-10-CM

## 2018-08-15 DIAGNOSIS — F39 Unspecified mood [affective] disorder: Secondary | ICD-10-CM

## 2018-08-15 MED ORDER — DEXTROAMPHETAMINE SULFATE 10 MG PO TABS: 10.0000 mg | ORAL_TABLET | Freq: Two times a day (BID) | ORAL | 0 refills | Status: DC

## 2018-08-15 NOTE — Progress Notes (Signed)
BH MD/PA/NP OP Progress Note  08/15/2018 3:08 PM Jake Elliott  MRN:  [id number]  Subjective:  Patient is a 48 year-old male with history of ADD and mood symptoms who presented for the follow-up appointment.  He reported that he has been doing well.  His blood pressure continues to be elevated as usual.  He currently denied having any suicidal homicidal ideations or plans.  He reported that he took his last 2 pills of his medication this morning.  He stated that he is working with his daughter and trying to find a job in [place].  No other acute symptoms noted.  He remains calm and alert during the interview.  He continues to have sleep problems.  He is not taking any medications for insomnia except for as needed Benadryl.  He denied having any perceptual disturbances.  He is compliant with his medications and no side effects noted at this time.     Chief Complaint:  Chief Complaint    Follow-up; Medication Refill     Visit Diagnosis:     ICD-10-CM   1. Attention deficit hyperactivity disorder (ADHD), combined type F90.2   2. Episodic mood disorder (HCC) F39     Past Medical History:  Past Medical History:  Diagnosis Date  . Angina at rest [facility])   . Anxiety   . Attention-deficit/hyperactivity disorder   . Bipolar disorder (HCC)   . Depression   . History of tobacco abuse   . Hypertension   . Hypertrophic cardiomyopathy (HCC)    s/p myomectomy  . Peripheral neuropathy   . PTSD (post-traumatic stress disorder)     Past Surgical History:  Procedure Laterality Date  . CARDIAC CATHETERIZATION     11/14/2010  . CORONARY ARTERY BYPASS GRAFT     for myomectomy for hypertrophic cardiomyopathy   Family History:  Family History  Problem Relation Age of Onset  . Cancer - Lung Mother   . Skin cancer Mother   . Alcohol abuse Mother   . Drug abuse Mother   . Stroke Father   . Hypertension Father   . Alcohol abuse Father   . Drug abuse Father   . Drug abuse Sister    Social History:   Social History   Socioeconomic History  . Marital status: Single    Spouse name: Not on file  . Number of children: Not on file  . Years of education: Not on file  . Highest education level: Not on file  Occupational History  . Not on file  Social Needs  . Financial resource strain: Not on file  . Food insecurity:    Worry: Not on file    Inability: Not on file  . Transportation needs:    Medical: Not on file    Non-medical: Not on file  Tobacco Use  . Smoking status: Current Every Day Smoker    Packs/day: 1.00    Years: 15.00    Pack years: 15.00    Types: Cigarettes    Start date: 12/31/1988  . Smokeless tobacco: Never Used  Substance and Sexual Activity  . Alcohol use: No    Alcohol/week: 0.0 standard drinks  . Drug use: No  . Sexual activity: Yes    Partners: Female    Birth control/protection: None  Lifestyle  . Physical activity:    Days per week: Not on file    Minutes per session: Not on file  . Stress: Not on file  Relationships  . Social connections:  Talks on phone: Not on file    Gets together: Not on file    Attends religious service: Not on file    Active member of club or organization: Not on file    Attends meetings of clubs or organizations: Not on file    Relationship status: Not on file  Other Topics Concern  . Not on file  Social History Narrative  . Not on file   Additional History:  He currently lives with his father.  Assessment:   Musculoskeletal:   Psychiatric Specialty Exam: Medication Refill   Anxiety       ROS  Blood pressure (!) 182/80, pulse (!) 109, temperature 97.9 F (36.6 C), temperature source Oral, weight 223 lb 12.8 oz (101.5 kg).Body mass index is 32.11 kg/m.  General Appearance: Casual  Eye Contact:  Good  Speech:  Clear and Coherent  Volume:  Normal  Mood:  Anxious  Affect:  Appropriate  Thought Process:  Goal Directed and Intact  Orientation:  Full (Time, Place, and Person)  Thought Content:   WDL  Suicidal Thoughts:  No  Homicidal Thoughts:  No  Memory:  Immediate;   Fair  Judgement:  Fair  Insight:  Fair  Psychomotor Activity:  Normal  Concentration:  Fair  Recall:  [employer] of Knowledge: Fair  Language: Fair  Akathisia:  No  Handed:  Right  AIMS (if indicated):    Assets:  Communication Skills Desire for Improvement Physical Health Social Support  ADL's:  Intact  Cognition: WNL  Sleep:     Current Medications: Current Outpatient Medications  Medication Sig Dispense Refill  . amLODipine (NORVASC) 10 MG tablet Take 1 tablet (10 mg total) by mouth daily. 30 tablet 11  . Aspirin-Caffeine (BC FAST PAIN RELIEF PO) Take by mouth as needed.    [name] dextroamphetamine (DEXTROSTAT) 10 MG tablet Take 1 tablet (10 mg total) by mouth 2 (two) times daily. To be filled 09/14/2018 60 tablet 0  . diphenhydrAMINE (BENADRYL) 25 MG tablet Take 3 tablets (75 mg total) by mouth at bedtime as needed. 90 tablet 1  . Melatonin 10 MG CAPS Take 10 mg by mouth at bedtime. 30 capsule 1  . tadalafil (CIALIS) 20 MG tablet Take 1 tablet (20 mg total) by mouth daily as needed for erectile dysfunction. 30 tablet 6   No current facility-administered medications for this visit.     Medical Decision Making:  Review of Psycho-Social Stressors (1)  Treatment Plan Summary:Medication management   Continue Dexedrine 10 mg p.o. twice daily.  He was given 2 month supply of the medications.   He  will continue with his vitamins and other medications as prescribed.    Follow-up in 2 months or earlier depending on his symptoms.    Discussed with him about side effects of medication and he demonstrated understanding.    More than 50% of the time spent in psychoeducation, counseling and coordination of care.     This note was generated in part or whole with voice recognition software. Voice regonition is usually quite accurate but there are transcription errors that can and very often do occur. I  apologize for any typographical errors that were not detected and corrected.    Brandy Hale, MD  08/15/2018, 3:08 PM

## 2018-10-10 ENCOUNTER — Other Ambulatory Visit: Payer: Self-pay | Admitting: Psychiatry

## 2018-10-10 MED ORDER — DEXTROAMPHETAMINE SULFATE 10 MG PO TABS: 10.0000 mg | ORAL_TABLET | Freq: Two times a day (BID) | ORAL | 0 refills | Status: DC

## 2018-10-13 ENCOUNTER — Telehealth: Payer: Self-pay

## 2018-10-13 ENCOUNTER — Telehealth: Payer: Self-pay | Admitting: Psychiatry

## 2018-10-13 DIAGNOSIS — F900 Attention-deficit hyperactivity disorder, predominantly inattentive type: Secondary | ICD-10-CM

## 2018-10-13 MED ORDER — DEXTROAMPHETAMINE SULFATE 10 MG PO TABS: 10.0000 mg | ORAL_TABLET | Freq: Two times a day (BID) | ORAL | 0 refills | Status: DC

## 2018-10-13 NOTE — Telephone Encounter (Signed)
pt states he has to use [facility] and that dr. Garnetta Buddy call in the medication and it has to be a rx. can you please send his rx to [facility]

## 2018-10-13 NOTE — Telephone Encounter (Signed)
Received message dexedrine could not be filled since they need escribed . I have sent dexedrine for 1 month to his pharmacy.

## 2018-10-17 ENCOUNTER — Ambulatory Visit: Payer: Self-pay | Admitting: Psychiatry

## 2018-10-24 ENCOUNTER — Ambulatory Visit: Payer: Self-pay | Admitting: Psychiatry

## 2018-10-24 ENCOUNTER — Other Ambulatory Visit: Payer: Self-pay

## 2018-10-31 ENCOUNTER — Other Ambulatory Visit: Payer: Self-pay

## 2018-10-31 ENCOUNTER — Telehealth: Payer: Self-pay | Admitting: Psychiatry

## 2018-10-31 ENCOUNTER — Ambulatory Visit (INDEPENDENT_AMBULATORY_CARE_PROVIDER_SITE_OTHER): Payer: Self-pay | Admitting: Psychiatry

## 2018-10-31 ENCOUNTER — Encounter: Payer: Self-pay | Admitting: Psychiatry

## 2018-10-31 DIAGNOSIS — F900 Attention-deficit hyperactivity disorder, predominantly inattentive type: Secondary | ICD-10-CM

## 2018-10-31 MED ORDER — DEXTROAMPHETAMINE SULFATE 10 MG PO TABS: 10.0000 mg | ORAL_TABLET | Freq: Two times a day (BID) | ORAL | 0 refills | Status: DC

## 2018-10-31 NOTE — Progress Notes (Signed)
Patient ID: Jake Elliott, male   DOB: September 23, 1970, 48 y.o.   MRN: [id number]  Patient is a 48 year old male with history of ADHD who was followed up for medication management. He reported that he has been doing well. He hasn't got the refill of his medication last month. He reported that he still trying to get his medications and feels better. He reported that he does not have any acute issues. He stated that he is trying to stay safe and compliant. He denied having any suicidal or homicidal ideations or plans.  He will get the refill of his medications towards the end of this month.  Plan: Continue Dexedrine as prescribed. He will follow up in two months earlier depending on his symptoms. I discussed the assessment and treatment plan with the patient. The patient was provided an opportunity to ask questions and all were answered. The patient agreed with the plan and demonstrated an understanding of the instructions.   The patient was advised to call back or seek an in-person evaluation if the symptoms worsen or if the condition fails to improve as anticipated.   I provided 15 minutes of non-face-to-face time during this encounter. [name]

## 2018-10-31 NOTE — Telephone Encounter (Signed)
Sent Dextrostat to pharmacy with date specified

## 2018-11-15 ENCOUNTER — Other Ambulatory Visit: Payer: Self-pay

## 2018-11-15 ENCOUNTER — Telehealth: Payer: Self-pay | Admitting: Cardiovascular Disease

## 2018-11-15 MED ORDER — TADALAFIL 20 MG PO TABS: 20.0000 mg | ORAL_TABLET | Freq: Every day | ORAL | 0 refills | Status: DC | PRN

## 2018-11-15 NOTE — Progress Notes (Signed)
Patient requesting a refill of Cialis 20 mg but also has reached out for current concerns of "pins and needles" in hands. Called [facility] and cancelled the RX. Will forward the request to  Triage for review.

## 2018-11-15 NOTE — Telephone Encounter (Signed)
Please review for refill. I have cancelled the 30 day refill I sent to [facility] , after further review routed this to Triage due to patient's current concerns.

## 2018-11-15 NOTE — Telephone Encounter (Signed)
I spoke with the patient.  He has 2 concerns:  1) a sensation of "pins & needles" that started in his hands about 2 weeks ago. - this has progressed to being felt all over his body. - symptoms come and go and last ~ 3-4 minutes. - he thinks he notices these more if he gets a little angry - denies any other associated symptoms of dizziness/ lightheadedness.  - he is working out more with weights since the gyms are closed and doing less cardio. - weights are used ~ QOD. - he feels like he is having some weakness in his shoulders. - he has not required the use of any tylenlol/ ibuprofen for symptoms - I have advised I am unsure if he has some type of nerve involvement contributing to his symptoms, but will review with Dr. Mariah Milling as he has not PCP    2) He is asking for a refill for cialis 20 mg- he states he takes 1/2-1 tablet daily for BP control. - he is aware I will review his refill with Dr. Mariah Milling as well.  He is aware we will call him back with further recommendations for both of the above concerns and is agreeable.

## 2018-11-15 NOTE — Telephone Encounter (Signed)
*  STAT* If patient is at the pharmacy, call can be transferred to refill team.   1. Which medications need to be refilled? (please list name of each medication and dose if known) Cialis  2. Which pharmacy/location (including street and city if local pharmacy) is medication to be sent to? [facility]  3. Do they need a 30 day or 90 day supply? 90

## 2018-11-15 NOTE — Telephone Encounter (Signed)
Patient states he feels like "pins and needles" everywhere. Mostly in his hands and now it is everywhere. Patient states he does not have a PCP. Please call to discuss.

## 2018-11-17 NOTE — Telephone Encounter (Signed)
cialis looks like it was already refilled 11/15/18  Sounds like having nerve issue Would avoid weight with hands for a while, Could be carpel tunnel syndrome? Would make sure not low B12

## 2018-11-18 NOTE — Telephone Encounter (Signed)
Unable to leave message voice mailbox not set up Will try later ./cy

## 2018-11-23 NOTE — Telephone Encounter (Signed)
No answer. Unable to leave a message. No VM set up.

## 2018-12-01 NOTE — Telephone Encounter (Signed)
Attempted to call all numbers listed for patient with no answer and no voicemail. Will close encounter since we have tried multiple attempts. If he should call back Dr. Rockey Situ did have recommendations and should probably check with primary care provider for evaluation.

## 2018-12-05 ENCOUNTER — Ambulatory Visit (INDEPENDENT_AMBULATORY_CARE_PROVIDER_SITE_OTHER): Payer: Self-pay | Admitting: Psychiatry

## 2018-12-05 ENCOUNTER — Other Ambulatory Visit: Payer: Self-pay

## 2018-12-05 ENCOUNTER — Encounter: Payer: Self-pay | Admitting: Psychiatry

## 2018-12-05 DIAGNOSIS — F900 Attention-deficit hyperactivity disorder, predominantly inattentive type: Secondary | ICD-10-CM

## 2018-12-05 NOTE — Progress Notes (Signed)
Patient ID: Jake Elliott, male   DOB: [birth year], 48 y.o.   MRN: [id number]   Patient is a 48 year old male with history of ADHD who was followed up for his medication management. He reported that he is doing well and has been compliant with his medications. He reported that he staying at home and is not doing any work at this time. Patient reported that he is not checking his blood pressure on a regular basis. He denied having any side effects of the medications. He's trying to go to the gym and trying to staying active. He stated that he does not sleep well at night and is feeling tired due to the same.  Patient reported that he still has one week supply of his medications.We discussed about his medications and he did not report any adverse effects at this time.  Plan I will refill his medications next week. Continue medications as prescribed I discussed the assessment and treatment plan with the patient. The patient was provided an opportunity to ask questions and all were answered. The patient agreed with the plan and demonstrated an understanding of the instructions.   The patient was advised to call back or seek an in-person evaluation if the symptoms worsen or if the condition fails to improve as anticipated.   I provided 15 minutes of non-face-to-face time during this encounter.

## 2018-12-12 ENCOUNTER — Telehealth: Payer: Self-pay

## 2018-12-12 ENCOUNTER — Other Ambulatory Visit: Payer: Self-pay | Admitting: Psychiatry

## 2018-12-12 DIAGNOSIS — F900 Attention-deficit hyperactivity disorder, predominantly inattentive type: Secondary | ICD-10-CM

## 2018-12-12 MED ORDER — DEXTROAMPHETAMINE SULFATE 10 MG PO TABS: 10.0000 mg | ORAL_TABLET | Freq: Two times a day (BID) | ORAL | 0 refills | Status: DC

## 2018-12-12 NOTE — Telephone Encounter (Signed)
Refilled 2 months supply of medication- sent to [facility]

## 2018-12-12 NOTE — Telephone Encounter (Signed)
received a request for a refill on dextroamphet 10mg  take 1 tablety by mouth twice daily .

## 2018-12-25 ENCOUNTER — Other Ambulatory Visit: Payer: Self-pay | Admitting: Cardiovascular Disease

## 2019-02-06 ENCOUNTER — Encounter: Payer: Self-pay | Admitting: Psychiatry

## 2019-02-06 ENCOUNTER — Other Ambulatory Visit: Payer: Self-pay

## 2019-02-06 ENCOUNTER — Ambulatory Visit (INDEPENDENT_AMBULATORY_CARE_PROVIDER_SITE_OTHER): Payer: Medicaid Other | Admitting: Psychiatry

## 2019-02-06 DIAGNOSIS — F900 Attention-deficit hyperactivity disorder, predominantly inattentive type: Secondary | ICD-10-CM

## 2019-02-06 NOTE — Progress Notes (Signed)
Patient ID: Jake Elliott, male   DOB: [birth year], 48 y.o.   MRN: [id number]   Patient is a 48 year old male with history of ADHD followed up for medication management. He reported that he has been compliant with medications. He stated that he has been taking his other medications including PRN bilateral ibuprofen and multivitamins. He stated that the Dexedrine is helping his ADHD symptoms and he take both the pills in the morning. He denied having any side effects. He stated that his blood pressure is also under control. He is trying to stay busy. No other acute symptoms noted at this time.  Plan I will continue his medications and will Refill for the next two months and will send the prescription electronically to the pharmacy. He will follow-up in two months earlier depending on his symptoms.   I connected with patient via telemedicine application and verified that I am speaking with the correct person using two identifiers.  I discussed the limitations of evaluation and management by telemedicine and the availability of in person appointments. The patient expressed understanding and agreed to proceed.   I discussed the assessment and treatment plan with the patient. The patient was provided an opportunity to ask questions and all were answered. The patient agreed with the plan and demonstrated an understanding of the instructions.   The patient was advised to call back or seek an in-person evaluation if the symptoms worsen or if the condition fails to improve as anticipated.   I provided 15 minutes of non-face-to-face time during this encounter.

## 2019-02-07 ENCOUNTER — Other Ambulatory Visit: Payer: Self-pay | Admitting: Psychiatry

## 2019-02-07 DIAGNOSIS — F900 Attention-deficit hyperactivity disorder, predominantly inattentive type: Secondary | ICD-10-CM

## 2019-02-07 MED ORDER — DEXTROAMPHETAMINE SULFATE 10 MG PO TABS: 10.0000 mg | ORAL_TABLET | Freq: Two times a day (BID) | ORAL | 0 refills | Status: DC

## 2019-04-05 ENCOUNTER — Ambulatory Visit (INDEPENDENT_AMBULATORY_CARE_PROVIDER_SITE_OTHER): Payer: Medicaid Other | Admitting: Psychiatry

## 2019-04-05 ENCOUNTER — Encounter: Payer: Self-pay | Admitting: Psychiatry

## 2019-04-05 ENCOUNTER — Other Ambulatory Visit: Payer: Self-pay

## 2019-04-05 DIAGNOSIS — F900 Attention-deficit hyperactivity disorder, predominantly inattentive type: Secondary | ICD-10-CM

## 2019-04-05 MED ORDER — DEXTROAMPHETAMINE SULFATE 10 MG PO TABS: ORAL_TABLET | ORAL | 0 refills | Status: DC

## 2019-04-05 NOTE — Progress Notes (Signed)
[place] MD OP Progress Note  I connected with  Jake Elliott on 04/05/19 by a video enabled telemedicine application and verified that I am speaking with the correct person using two identifiers.   I discussed the limitations of evaluation and management by telemedicine. The patient expressed understanding and agreed to proceed.   04/05/2019 2:33 PM Jake Elliott  MRN:  [id number]  Chief Complaint:  " I am doing okay, can do better."  HPI: Pt reported that he has been taking 2 tabs of Dextroamphetamine in the morning which works well. However, once the medication wears off around 1 pm he starts struggling with concentration. He reported trying Adderall, Vyvanse and methylphenidate based medications in the past. He feels he has done better on his current medicine though he can use a higher dose. He did report poor sleep but does not believe that stimulant medication has anything to do with him not being able to fall asleep. He takes OTC melatonin which seems to be helpful.  Visit Diagnosis: Attention deficit hyperactivity disorder (ADHD), predominantly inattentive type    Past Psychiatric History: ADHD, inattentive type  Past Medical History:  Past Medical History:  Diagnosis Date  . Angina at rest [facility])   . Anxiety   . Attention-deficit/hyperactivity disorder   . Bipolar disorder ([place])   . Depression   . History of tobacco abuse   . Hypertension   . Hypertrophic cardiomyopathy ([place])    s/p myomectomy  . Peripheral neuropathy   . PTSD (post-traumatic stress disorder)     Past Surgical History:  Procedure Laterality Date  . CARDIAC CATHETERIZATION     11/14/2010  . CORONARY ARTERY BYPASS GRAFT     for myomectomy for hypertrophic cardiomyopathy    Family Psychiatric History: see below  Family History:  Family History  Problem Relation Age of Onset  . Cancer - Lung Mother   . Skin cancer Mother   . Alcohol abuse Mother   . Drug abuse Mother   . Stroke Father   .  Hypertension Father   . Alcohol abuse Father   . Drug abuse Father   . Drug abuse Sister     Social History:  Social History   Socioeconomic History  . Marital status: Single    Spouse name: Not on file  . Number of children: Not on file  . Years of education: Not on file  . Highest education level: Not on file  Occupational History  . Not on file  Social Needs  . Financial resource strain: Not on file  . Food insecurity    Worry: Not on file    Inability: Not on file  . Transportation needs    Medical: Not on file    Non-medical: Not on file  Tobacco Use  . Smoking status: Current Every Day Smoker    Packs/day: 1.00    Years: 15.00    Pack years: 15.00    Types: Cigarettes    Start date: 12/31/1988  . Smokeless tobacco: Never Used  Substance and Sexual Activity  . Alcohol use: No    Alcohol/week: 0.0 standard drinks  . Drug use: No  . Sexual activity: Yes    Partners: Female    Birth control/protection: None  Lifestyle  . Physical activity    Days per week: Not on file    Minutes per session: Not on file  . Stress: Not on file  Relationships  . Social connections    Talks on phone: Not on file  Gets together: Not on file    Attends religious service: Not on file    Active member of club or organization: Not on file    Attends meetings of clubs or organizations: Not on file    Relationship status: Not on file  Other Topics Concern  . Not on file  Social History Narrative  . Not on file    Allergies:  Allergies  Allergen Reactions  . Acetaminophen Other (See Comments)    headache  . Bystolic [Nebivolol Hcl]     Headaches  . Codeine     Nausea & headache Other reaction(s): HEADACHE  . Morphine And Related     Nausea & headache.    Metabolic Disorder Labs: No results found for: HGBA1C, MPG Lab Results  Component Value Date   PROLACTIN 6.1 07/15/2016   Lab Results  Component Value Date   CHOL 261 (H) 07/15/2016   TRIG 165 (H) 07/15/2016    HDL 53 07/15/2016   CHOLHDL 4.9 07/15/2016   VLDL 33 07/15/2016   LDLCALC 175 (H) 07/15/2016   Lab Results  Component Value Date   TSH 3.553 07/15/2016   TSH 1.80 01/23/2013    Therapeutic Level Labs: No results found for: LITHIUM No results found for: VALPROATE No components found for:  CBMZ  Current Medications: Current Outpatient Medications  Medication Sig Dispense Refill  . amLODipine (NORVASC) 10 MG tablet Take 1 tablet (10 mg total) by mouth daily. 30 tablet 11  . Aspirin-Caffeine (BC FAST PAIN RELIEF PO) Take by mouth as needed.    [name]. dextroamphetamine (DEXTROSTAT) 10 MG tablet Take 1 tablet (10 mg total) by mouth 2 (two) times daily. To be refilled 9/27/ 2020 60 tablet 0  . diphenhydrAMINE (BENADRYL) 25 MG tablet Take 3 tablets (75 mg total) by mouth at bedtime as needed. 90 tablet 1  . Melatonin 10 MG CAPS Take 10 mg by mouth at bedtime. 30 capsule 1  . tadalafil (CIALIS) 20 MG tablet TAKE ONE TABLET BY MOUTH DAILY AS NEEDED FOR ERECTILE DYSFUNCTION 30 tablet 2   No current facility-administered medications for this visit.     Musculoskeletal: Strength & Muscle Tone: unable to assess due to telemed visit Gait & Station: unable to assess due to telemed visit Patient leans: unable to assess due to telemed visit  Psychiatric Specialty Exam: ROS  There were no vitals taken for this visit.There is no height or weight on file to calculate BMI.  General Appearance: Casual  Eye Contact:  Good  Speech:  Clear and Coherent and Normal Rate  Volume:  Normal  Mood:  Euthymic  Affect:  Congruent  Thought Process:  Goal Directed, Linear and Descriptions of Associations: Intact  Orientation:  Full (Time, Place, and Person)  Thought Content: Logical   Suicidal Thoughts:  No  Homicidal Thoughts:  No  Memory:  Recent;   Good Remote;   Good  Judgement:  Intact  Insight:  Fair  Psychomotor Activity:  Normal  Concentration:  Concentration: Good and Attention Span: Good   Recall:  Good  Fund of Knowledge: Good  Language: Good  Akathisia:  No  Handed:  Right  AIMS (if indicated): not done  Assets:  Communication Skills Desire for Improvement Financial Resources/Insurance Housing Transportation  ADL's:  Intact  Cognition: WNL  Sleep:  Good   Screenings: AUDIT     [facility]/25/[facility]  Alcohol Use Disorder Identification Test Final Score (AUDIT)  0  Assessment and Plan: 48 y/o male reporting difficulty in focusing in the afternoon hours. Agreeable to increase the dose for optimal effect.  1. Attention deficit hyperactivity disorder (ADHD), predominantly inattentive type  - Increase dextroamphetamine (DEXTROSTAT) 10 MG tablet; Take 2 tablets in the morning and 1 tablet in the afternoon  Dispense: 90 tablet; Refill: 0 - Increase dextroamphetamine (DEXTROSTAT) 10 MG tablet; Take 2 tablets in the morning and 1 tablet in the afternoon  Dispense: 90 tablet; Refill: 0   F/up in 2 months.   Zena Amos, MD 04/05/2019, 2:33 PM

## 2019-04-10 ENCOUNTER — Ambulatory Visit: Payer: Medicaid Other | Admitting: Psychiatry

## 2019-04-22 ENCOUNTER — Other Ambulatory Visit: Payer: Self-pay | Admitting: Cardiovascular Disease

## 2019-05-31 ENCOUNTER — Ambulatory Visit (INDEPENDENT_AMBULATORY_CARE_PROVIDER_SITE_OTHER): Payer: Medicaid Other | Admitting: Psychiatry

## 2019-05-31 ENCOUNTER — Encounter: Payer: Self-pay | Admitting: Psychiatry

## 2019-05-31 ENCOUNTER — Other Ambulatory Visit: Payer: Self-pay

## 2019-05-31 DIAGNOSIS — F900 Attention-deficit hyperactivity disorder, predominantly inattentive type: Secondary | ICD-10-CM

## 2019-05-31 MED ORDER — DEXTROAMPHETAMINE SULFATE 10 MG PO TABS: ORAL_TABLET | ORAL | 0 refills | Status: DC

## 2019-05-31 NOTE — Progress Notes (Signed)
[place] MD OP Progress Note  I connected with  Jake Elliott on 05/31/19 by a video enabled telemedicine application and verified that I am speaking with the correct person using two identifiers.   I discussed the limitations of evaluation and management by telemedicine. The patient expressed understanding and agreed to proceed.   05/31/2019 2:31 PM Jake Elliott  MRN:  [id number]  Chief Complaint:  " I am doing okay."  HPI: Pt reported that he has been doing well with increased dose.  He stated that he feels he can still be better and maybe needs to get back on health supplements that he was taking in the past.  He denied any significant issues or concerns.  He stated he would like to continue the same dose for now.   Visit Diagnosis: Attention deficit hyperactivity disorder (ADHD), predominantly inattentive type   Past Psychiatric History: ADHD, inattentive type  Past Medical History:  Past Medical History:  Diagnosis Date  . Angina at rest [facility])   . Anxiety   . Attention-deficit/hyperactivity disorder   . Bipolar disorder ([name])   . Depression   . History of tobacco abuse   . Hypertension   . Hypertrophic cardiomyopathy ([name])    s/p myomectomy  . Peripheral neuropathy   . PTSD (post-traumatic stress disorder)     Past Surgical History:  Procedure Laterality Date  . CARDIAC CATHETERIZATION     11/14/2010  . CORONARY ARTERY BYPASS GRAFT     for myomectomy for hypertrophic cardiomyopathy    Family Psychiatric History: see below  Family History:  Family History  Problem Relation Age of Onset  . Cancer - Lung Mother   . Skin cancer Mother   . Alcohol abuse Mother   . Drug abuse Mother   . Stroke Father   . Hypertension Father   . Alcohol abuse Father   . Drug abuse Father   . Drug abuse Sister     Social History:  Social History   Socioeconomic History  . Marital status: Single    Spouse name: Not on file  . Number of children: Not on file  . Years of education:  Not on file  . Highest education level: Not on file  Occupational History  . Not on file  Tobacco Use  . Smoking status: Current Every Day Smoker    Packs/day: 1.00    Years: 15.00    Pack years: 15.00    Types: Cigarettes    Start date: 12/31/1988  . Smokeless tobacco: Never Used  Substance and Sexual Activity  . Alcohol use: No    Alcohol/week: 0.0 standard drinks  . Drug use: No  . Sexual activity: Yes    Partners: Female    Birth control/protection: None  Other Topics Concern  . Not on file  Social History Narrative  . Not on file   Social Determinants of Health   Financial Resource Strain:   . Difficulty of Paying Living Expenses: Not on file  Food Insecurity:   . Worried About Charity fundraiser in the Last Year: Not on file  . Ran Out of Food in the Last Year: Not on file  Transportation Needs:   . Lack of Transportation (Medical): Not on file  . Lack of Transportation (Non-Medical): Not on file  Physical Activity:   . Days of Exercise per Week: Not on file  . Minutes of Exercise per Session: Not on file  Stress:   . Feeling of Stress : Not on  file  Social Connections:   . Frequency of Communication with Friends and Family: Not on file  . Frequency of Social Gatherings with Friends and Family: Not on file  . Attends Religious Services: Not on file  . Active Member of Clubs or Organizations: Not on file  . Attends BankerClub or Organization Meetings: Not on file  . Marital Status: Not on file    Allergies:  Allergies  Allergen Reactions  . Acetaminophen Other (See Comments)    headache  . Bystolic [Nebivolol Hcl]     Headaches  . Codeine     Nausea & headache Other reaction(s): HEADACHE  . Morphine And Related     Nausea & headache.    Metabolic Disorder Labs: No results found for: HGBA1C, MPG Lab Results  Component Value Date   PROLACTIN 6.1 07/15/2016   Lab Results  Component Value Date   CHOL 261 (H) 07/15/2016   TRIG 165 (H) 07/15/2016   HDL  53 07/15/2016   CHOLHDL 4.9 07/15/2016   VLDL 33 07/15/2016   LDLCALC 175 (H) 07/15/2016   Lab Results  Component Value Date   TSH 3.553 07/15/2016   TSH 1.80 01/23/2013    Therapeutic Level Labs: No results found for: LITHIUM No results found for: VALPROATE No components found for:  CBMZ  Current Medications: Current Outpatient Medications  Medication Sig Dispense Refill  . amLODipine (NORVASC) 10 MG tablet TAKE ONE TABLET BY MOUTH DAILY 30 tablet 0  . Aspirin-Caffeine (BC FAST PAIN RELIEF PO) Take by mouth as needed.    [name]. dextroamphetamine (DEXTROSTAT) 10 MG tablet Take 2 tablets in the morning and 1 tablet in the afternoon 90 tablet 0  . dextroamphetamine (DEXTROSTAT) 10 MG tablet Take 2 tablets in the morning and 1 tablet in the afternoon 90 tablet 0  . diphenhydrAMINE (BENADRYL) 25 MG tablet Take 3 tablets (75 mg total) by mouth at bedtime as needed. 90 tablet 1  . Melatonin 10 MG CAPS Take 10 mg by mouth at bedtime. 30 capsule 1  . tadalafil (CIALIS) 20 MG tablet TAKE ONE TABLET BY MOUTH DAILY AS NEEDED FOR ERECTILE DYSFUNCTION 30 tablet 0   No current facility-administered medications for this visit.    Musculoskeletal: Strength & Muscle Tone: unable to assess due to telemed visit Gait & Station: unable to assess due to telemed visit Patient leans: unable to assess due to telemed visit  Psychiatric Specialty Exam: ROS  There were no vitals taken for this visit.There is no height or weight on file to calculate BMI.  General Appearance: Casual  Eye Contact:  Good  Speech:  Clear and Coherent and Normal Rate  Volume:  Normal  Mood:  Euthymic  Affect:  Congruent  Thought Process:  Goal Directed, Linear and Descriptions of Associations: Intact  Orientation:  Full (Time, Place, and Person)  Thought Content: Logical   Suicidal Thoughts:  No  Homicidal Thoughts:  No  Memory:  Recent;   Good Remote;   Good  Judgement:  Intact  Insight:  Fair  Psychomotor Activity:   Normal  Concentration:  Concentration: Good and Attention Span: Good  Recall:  Good  Fund of Knowledge: Good  Language: Good  Akathisia:  No  Handed:  Right  AIMS (if indicated): not done  Assets:  Communication Skills Desire for Improvement Financial Resources/Insurance Housing Transportation  ADL's:  Intact  Cognition: WNL  Sleep:  Good     Screenings: AUDIT     Office Visit from 06/25/2015 in  [facility]  Alcohol Use Disorder Identification Test Final Score (AUDIT)  0       Assessment and Plan: Patient reported doing well on his current dose.  1. Attention deficit hyperactivity disorder (ADHD), predominantly inattentive type  - Continue dextroamphetamine (DEXTROSTAT) 10 MG tablet; Take 2 tablets in the morning and 1 tablet in the afternoon  Dispense: 90 tablet; Refill: 0  F/up in 3 months.   Zena Amos, MD 05/31/2019, 2:31 PM

## 2019-06-12 ENCOUNTER — Other Ambulatory Visit: Payer: Self-pay | Admitting: Cardiovascular Disease

## 2019-06-15 ENCOUNTER — Other Ambulatory Visit: Payer: Self-pay | Admitting: Cardiovascular Disease

## 2019-06-15 NOTE — Telephone Encounter (Signed)
*  STAT* If patient is at the pharmacy, call can be transferred to refill team.   1. Which medications need to be refilled? (please list name of each medication and dose if known) Cialis 10 mg daily, amlodipine 10 mg daily  2. Which pharmacy/location (including street and city if local pharmacy) is medication to be sent to? [facility] on  [address]  3. Do they need a 30 day or 90 day supply? [place]

## 2019-06-19 MED ORDER — TADALAFIL 20 MG PO TABS: ORAL_TABLET | ORAL | 0 refills | Status: DC

## 2019-06-19 MED ORDER — AMLODIPINE BESYLATE 10 MG PO TABS: 10.0000 mg | ORAL_TABLET | Freq: Every day | ORAL | 0 refills | Status: DC

## 2019-06-19 NOTE — Telephone Encounter (Signed)
Requested Prescriptions   Signed Prescriptions Disp Refills  . amLODipine (NORVASC) 10 MG tablet 15 tablet 0    Sig: Take 1 tablet (10 mg total) by mouth daily.    Authorizing Provider: Antonieta Iba    Ordering User: Shawnie Dapper, Leodis Alcocer C  . tadalafil (CIALIS) 20 MG tablet 15 tablet 0    Sig: TAKE ONE TABLET BY MOUTH DAILY AS NEEDED FOR ERECTILE DYSFUNCTION    Authorizing Provider: Antonieta Iba    Ordering User: Kendrick Fries

## 2019-07-05 ENCOUNTER — Other Ambulatory Visit: Payer: Self-pay

## 2019-07-05 ENCOUNTER — Other Ambulatory Visit: Payer: Self-pay | Admitting: Cardiovascular Disease

## 2019-07-05 ENCOUNTER — Ambulatory Visit (INDEPENDENT_AMBULATORY_CARE_PROVIDER_SITE_OTHER): Payer: Self-pay | Admitting: Family

## 2019-07-05 ENCOUNTER — Encounter: Payer: Self-pay | Admitting: Family

## 2019-07-05 VITALS — BP 168/98 | HR 91 | Resp 18 | Ht 69.0 in | Wt 221.8 lb

## 2019-07-05 DIAGNOSIS — E782 Mixed hyperlipidemia: Secondary | ICD-10-CM

## 2019-07-05 DIAGNOSIS — I10 Essential (primary) hypertension: Secondary | ICD-10-CM

## 2019-07-05 DIAGNOSIS — N529 Male erectile dysfunction, unspecified: Secondary | ICD-10-CM

## 2019-07-05 DIAGNOSIS — I421 Obstructive hypertrophic cardiomyopathy: Secondary | ICD-10-CM

## 2019-07-05 MED ORDER — LISINOPRIL 10 MG PO TABS: 10.0000 mg | ORAL_TABLET | Freq: Every day | ORAL | 5 refills | Status: DC

## 2019-07-05 MED ORDER — TADALAFIL 20 MG PO TABS: ORAL_TABLET | ORAL | 5 refills | Status: DC

## 2019-07-05 MED ORDER — LISINOPRIL 10 MG PO TABS: 10.0000 mg | ORAL_TABLET | Freq: Every day | ORAL | 2 refills | Status: DC

## 2019-07-05 MED ORDER — AMLODIPINE BESYLATE 10 MG PO TABS: 10.0000 mg | ORAL_TABLET | Freq: Every day | ORAL | 2 refills | Status: DC

## 2019-07-05 MED ORDER — TADALAFIL 20 MG PO TABS: ORAL_TABLET | ORAL | 2 refills | Status: DC

## 2019-07-05 MED ORDER — AMLODIPINE BESYLATE 10 MG PO TABS: 10.0000 mg | ORAL_TABLET | Freq: Every day | ORAL | 5 refills | Status: DC

## 2019-07-05 NOTE — Progress Notes (Signed)
Office Visit    Patient Name: Jake Elliott Date of Encounter: 07/05/2019  Primary Care Provider:  Patient, No Pcp Per Primary Cardiologist:  Julien Nordmann, MD Electrophysiologist:  None   Chief Complaint    Jake Elliott is a 49 y.o. male with a hx of HOCM prior myomectomy in 2008 with follow-up echo with moderate LVH, cardiac cath 11/2010 with no significant artery disease, OSA intolerant to CPAP, nonsustained VT, ED, tachycardia, hypertension, anxiety, depression. Presents today for follow-up of hypertension  Past Medical History    Past Medical History:  Diagnosis Date  . Angina at rest [facility])   . Anxiety   . Attention-deficit/hyperactivity disorder   . Bipolar disorder (HCC)   . Depression   . History of tobacco abuse   . Hypertension   . Hypertrophic cardiomyopathy (HCC)    s/p myomectomy  . Peripheral neuropathy   . PTSD (post-traumatic stress disorder)    Past Surgical History:  Procedure Laterality Date  . CARDIAC CATHETERIZATION     11/14/2010  . CORONARY ARTERY BYPASS GRAFT     for myomectomy for hypertrophic cardiomyopathy    Allergies  Allergies  Allergen Reactions  . Acetaminophen Other (See Comments)    headache  . Bystolic [Nebivolol Hcl]     Headaches  . Codeine     Nausea & headache Other reaction(s): HEADACHE  . Morphine And Related     Nausea & headache.    History of Present Illness    Jake Elliott is a 49 y.o. male with a hx of HOCM prior myomectomy in 2008 with follow-up echo with moderate LVH, cardiac cath 11/2010 with no significant artery disease, OSA intolerant to CPAP, nonsustained VT, ED, tachycardia, hypertension, anxiety, depression. Last seen by Dr. Mariah Milling 03/14/18.   He had HOCM and myomectomy in 2008 with follow-up echo showing moderate LVH.  Cardiac cath 11/2010 with no significant coronary artery disease.  Previous cardiac testing includes Holter monitor 2013 showing sinus rhythm with APCs and PVCs. An echo was ordered in 2014  but not completed.   He has a history of second-degree heart block while on calcium channel blockers and beta-blockers.  He was continued solely on metoprolol. However he self-discontinued this medication. He has declined verapamil, diltiazem, and beta blockers in the past. Hs remained on Cialis 20mg  daily despite multiple discussions that this is not a routinely used antihypertensive agent. At his last visit with Dr. Amlodipine 10mg  daily was added.   He follows with psychiatry for ADHD.   Reports some lightheadedness out of nowhere multiple times per day. Sometimes happens after hat he feels like as a PVC. We discussed echocardiogram for evaluation and he is agreeable. Denies syncope.    Tells me the CCB inhibits his performance in the gym. Takes his CCB intermittently. Continues to take Cialis daily. Checks his BP intermittently at the gym, cannot recall readings. Tells me his blood pressure is "better" at the gym. Recently started back on creatinine - we discussed that this could have negative impacts on his kidneys. Works out most days and recently resumed boxing.   No SOB, DOE. No chest pain, pressure, tightness. No LE edema. Tells me he sometimes will get swelling in his abdomen.   EKGs/Labs/Other Studies Reviewed:   The following studies were reviewed today:  EKG:  EKG is ordered today.  The ekg ordered today demonstrates SR 91 bpm withbiatrial enlargement, incomplete RBBB. Nonspecific ST/T wave changes in septal and lateral leads  Recent Labs: No  results found for requested labs within last 8760 hours.  Recent Lipid Panel    Component Value Date/Time   CHOL 261 (H) 07/15/2016 1317   TRIG 165 (H) 07/15/2016 1317   HDL 53 07/15/2016 1317   CHOLHDL 4.9 07/15/2016 1317   VLDL 33 07/15/2016 1317   LDLCALC 175 (H) 07/15/2016 1317    Home Medications   Current Meds  Medication Sig  . amLODipine (NORVASC) 10 MG tablet Take 1 tablet (10 mg total) by mouth daily. (Patient  taking differently: Take 10 mg by mouth daily. Pt reports that he does not take this medication daily.  Pt states that he takes it dependant on his workout schedule.)  . Aspirin-Caffeine (BC FAST PAIN RELIEF PO) Take by mouth as needed.  [name] dextroamphetamine (DEXTROSTAT) 10 MG tablet Take 2 tablets in the morning and 1 tablet in the afternoon  . [START ON 07/28/2019] dextroamphetamine (DEXTROSTAT) 10 MG tablet Take 2 tablets in the morning and 1 tablet in the afternoon.  . diphenhydrAMINE (BENADRYL) 25 MG tablet Take 3 tablets (75 mg total) by mouth at bedtime as needed.  . Melatonin 10 MG CAPS Take 10 mg by mouth at bedtime.  . tadalafil (CIALIS) 20 MG tablet TAKE ONE TABLET BY MOUTH DAILY AS NEEDED FOR ERECTILE DYSFUNCTION      Review of Systems    Review of Systems  Constitution: Negative for chills, fever and malaise/fatigue.  Cardiovascular: Negative for chest pain, dyspnea on exertion, leg swelling, near-syncope, orthopnea, palpitations and syncope.  Respiratory: Negative for cough, shortness of breath and wheezing.   Gastrointestinal: Negative for nausea and vomiting.  Neurological: Positive for light-headedness. Negative for dizziness and weakness.   All other systems reviewed and are otherwise negative except as noted above.  Physical Exam    VS:  BP (!) 168/98 (BP Location: Left Arm, Patient Position: Sitting, Cuff Size: Large)   Pulse 91   Resp 18   Ht 5\' 9"  (1.753 m)   Wt 221 lb 12 oz (100.6 kg)   SpO2 97%   BMI 32.75 kg/m  , BMI Body mass index is 32.75 kg/m. GEN: Well nourished, well developed, in no acute distress. HEENT: normal. Neck: Supple, no JVD, carotid bruits, or masses. Cardiac: RRR, no murmurs, rubs, or gallops. No clubbing, cyanosis, edema.  Radials/DP/PT 2+ and equal bilaterally.  Respiratory:  Respirations regular and unlabored, clear to auscultation bilaterally. GI: Soft, nontender, nondistended. MS: No deformity or atrophy. Skin: Warm and dry, no  rash. Neuro:  Strength and sensation are intact. Psych: Normal affect.  Assessment & Plan    1. HTN - BP remains consistently above goal of <130/80. Continues to use Cialis daily for HTN. Discussed again that this is not a recommended agent for HTN. He is insistent that he will continue to utilize. This has been previously refilled by his primary cardiologist. BP remains elevated. Continue Amlodipine 10mg  daily. Start Lisinopril 10mg  daily. BMET, CBC today.   2. HLD - Has declined a statin and Zetia in the past. Lipid lowering diet discussed. Lipid profile and direct LDL today.   3. Hypertrophic obstructive cardiomyopathy - s/p myomectomy 2008. Echo post op with moderate LVH. Cath 2012 with normal LVEF. He has previously declined beta-blocker, diltiazem, verapamil. We discussed that GDMT would include a beta blocker and CCB. He is agreeable to continue Amlodipine10mg  daily though still feels it "inhibits his performance in the gym". Plan to update echocardiogram.   4. Chronic pain syndrome - Previously on high-dose narcotics.  Has reduced his ibuprofen to 6 per day and 2 "Goody Powders" per day.   Disposition: Follow up in 6 month(s) with Dr. Rockey Situ.   Loel Dubonnet, NP 07/05/2019, 2:55 PM

## 2019-07-05 NOTE — Telephone Encounter (Signed)
Requested Prescriptions   Signed Prescriptions Disp Refills  . amLODipine (NORVASC) 10 MG tablet 90 tablet 2    Sig: Take 1 tablet (10 mg total) by mouth daily.    Authorizing Provider: Alver Sorrow    Ordering User: Hava Massingale C  . lisinopril (ZESTRIL) 10 MG tablet 90 tablet 2    Sig: Take 1 tablet (10 mg total) by mouth daily.    Authorizing Provider: Alver Sorrow    Ordering User: Neev Mcmains C  . tadalafil (CIALIS) 20 MG tablet 90 tablet 2    Sig: TAKE ONE TABLET BY MOUTH DAILY AS NEEDED FOR ERECTILE DYSFUNCTION    Authorizing Provider: Alver Sorrow    Ordering User: Kendrick Fries

## 2019-07-05 NOTE — Patient Instructions (Signed)
Medication Instructions:  Your physician has recommended you make the following change in your medication:   CONTINUE Amlodipine 10mg  daily  START Lisinopril 10mg  (one tablet) daily  CONTINUE Cialis  Recommend trying the Lisinopril in addition to your Amlodipine. Hopefully this will help with your blood pressure and assist you to have better endurance in the gym.   *If you need a refill on your cardiac medications before your next appointment, please call your pharmacy*  Lab Work: Your physician recommends that you return for lab work today: CMET, lipid panel, direct LDL, CBC  This will let look at your kidney function, liver function, cholesterol numbers, and blood counts.   If you have labs (blood work) drawn today and your tests are completely normal, you will receive your results only by: MyChart Message (if you have MyChart) OR . A paper copy in the mail If you have any lab test that is abnormal or we need to change your treatment, we will call you to review the results.  Testing/Procedures:  You had an EKG today. It shows sinus rhythm 91 beats per minute.   Your physician has requested that you have an echocardiogram. Echocardiography is a painless test that uses sound waves to create images of your heart. It provides your doctor with information about the size and shape of your heart and how well your heart's chambers and valves are working. This procedure takes approximately one hour. There are no restrictions for this procedure.  Follow-Up: At [facility], you and your health needs are our priority.  As part of our continuing mission to provide you with exceptional heart care, we have created designated Provider Care Teams.  These Care Teams include your primary Cardiologist (physician) and Advanced Practice Providers (APPs -  Physician Assistants and Nurse Practitioners) who all work together to provide you with the care you need, when you need it.  Your next  appointment:   6 month(s)  The format for your next appointment:   In Person  Provider:    You may see [name], MD or one of the following Advanced Practice Providers on your designated Care Team:    [facility], NP  Julien Nordmann, PA-C  Nicolasa Ducking, PA-C  Other Instructions  Below is information on your hypertrophic cardiomyopathy. Your pumping function was improved on your most recent echocardiogram, but we will recheck it to make sure your valves and heart are pumping efficiently as it was a few years since your last echocardiogram.   Hypertrophic Cardiomyopathy  Hypertrophic cardiomyopathy (HCM) is a heart condition in which part of the heart muscle gets too thick. The condition can cause a dangerous and abnormal heart rhythm. It can also weaken the heart over time. The heart is divided into four chambers. The thickening usually affects the pumping chamber on the lower left (left ventricle). HCM may also affect the valve that lets blood flow into the left ventricle (mitral valve). Symptoms often begin when a person is about 47 years old. What are the causes? This condition is usually caused by abnormal genes that control heart muscle growth. These abnormal genes are passed down through families (are inherited). What increases the risk? You are more likely to develop this condition if you have a family history of HCM. What are the signs or symptoms? Symptoms of this condition include:  Shortness of breath, especially after exercising or lying down.  Chest pain.  Dizziness.  Fatigue.  Irregular or fast heart rate.  Fainting,  especially after physical activity. How is this diagnosed? This condition is diagnosed based on:  A physical exam that involves checking for an abnormal heart sound (heart murmur).  Tests, such as: ? An electrocardiogram (ECG). This is a test that records your heart's electrical activity. ? An echocardiogram. This test can show  whether your left ventricle is enlarged and whether it fills slowly. ? An exercise stress test. This test is done to collect information about how your heart functions during exercise. ? A Doppler test. This is a test that shows irregular blood flow and pressure differences inside the heart. ? A chest X-ray to see whether your heart is enlarged. ? An MRI. ? Genetic testing done on a blood sample. How is this treated? Treatment for HCM depends on how severe your symptoms are. There are several options for treatment, including:  Medicine. Medicines can be given to: ? Reduce the workload of your heart. ? Lower your blood pressure. ? Thin your blood and prevent clots.  A device. Devices that can be used to treat this condition include: ? A pacemaker. This device helps to control your heartbeat. ? A defibrillator. This device restores a normal heart rhythm.  Surgery. This may include a procedure to: ? Inject alcohol into the small blood vessels that supply your heart muscle (alcohol septal ablation).This is done during a procedure called cardiac catheterization. The goal is to cause the muscle to become thinner. ? Remove part of the wall that divides the right and left sides of the heart (septum) with a procedure called surgical myectomy. ? Replace the mitral valve. Follow these instructions at home: Activity  Avoid strenuous exercise and activities, such as shoveling snow. Do not participate in high intensity activities, such as competitive sports.  Get regular physical activity. Most patients can participate in low to moderate intensity exercise such as walking. Ask your health care provider what activity level is safe for you.  Do not lift anything that is heavier than 10 lb (4.5 kg), or the limit that you are told, until your health care provider says that it is safe. Lifestyle  Maintain a healthy weight. If you need help with losing weight, ask your health care provider.  Eat a  heart-healthy diet.  Do not drink alcohol if: ? Your health care provider tells you not to drink. ? You are pregnant, may be pregnant, or are planning to become pregnant.  If you drink alcohol: ? Limit how much you use to:  0-1 drink a day for women.  0-2 drinks a day for men. ? Be aware of how much alcohol is in your drink. In the [place] one drink equals one 12 oz bottle of beer (355 mL), one 5 oz glass of wine (148 mL), or one 1 oz glass of hard liquor (44 mL).  Do not use any products that contain nicotine or tobacco, such as cigarettes, e-cigarettes, and chewing tobacco. If you need help quitting, ask your health care provider. General instructions  Make sure the members of your household know how to do CPR in case of an emergency.  Take over-the-counter and prescription medicines only as told by your health care provider.  Keep all follow-up visits as told by your health care provider. This is important. Contact a health care provider if:  You have new symptoms.  Your symptoms get worse. Get help right away if:  You have chest pain or shortness of breath, especially during or after sports.  You feel faint  or you pass out.  You have trouble breathing even at rest.  Your feet or ankles swell.  Your heartbeat seems irregular or seems faster than normal (palpitations). These symptoms may represent a serious problem that is an emergency. Do not wait to see if the symptoms will go away. Get medical help right away. Call your local emergency services (911 in the [place] Do not drive yourself to the hospital. Summary  Hypertrophic cardiomyopathy (HCM) is a heart condition in which part of the heart muscle gets too thick.  The condition can cause a dangerous and abnormal heart rhythm, and it can weaken the heart over time.  Avoid strenuous exercise and activities, such as shoveling snow.  Make sure the members of your household know how to do CPR in case of an  emergency.  Keep all follow-up visits as told by your health care provider. This is important. This information is not intended to replace advice given to you by your health care provider. Make sure you discuss any questions you have with your health care provider. Document Revised: 02/22/2018 Document Reviewed: 02/22/2018 Elsevier Patient Education  [place].

## 2019-07-05 NOTE — Telephone Encounter (Signed)
*  STAT* If patient is at the pharmacy, call can be transferred to refill team.   1. Which medications need to be refilled? (please list name of each medication and dose if known) amlodipine 10 mg po q d      Lisinopril 10 mg po q  d     Cialis 20 mg po prn   2. Which pharmacy/location (including street and city if local pharmacy) is medication to be sent to? [facility] [place]   3. Do they need a 30 day or 90 day supply? 90

## 2019-07-06 ENCOUNTER — Encounter: Payer: Self-pay | Admitting: Family

## 2019-07-06 ENCOUNTER — Telehealth: Payer: Self-pay

## 2019-07-06 LAB — CBC WITH DIFFERENTIAL/PLATELET

## 2019-07-06 LAB — COMPREHENSIVE METABOLIC PANEL
ALT: 33 IU/L (ref 0–44)
AST: 31 IU/L (ref 0–40)
Albumin/Globulin Ratio: 2 (ref 1.2–2.2)
Albumin: 4.5 g/dL (ref 4.0–5.0)
Alkaline Phosphatase: 65 IU/L (ref 39–117)
BUN/Creatinine Ratio: 15 (ref 9–20)
BUN: 21 mg/dL (ref 6–24)
Bilirubin Total: 0.8 mg/dL (ref 0.0–1.2)
CO2: 21 mmol/L (ref 20–29)
Calcium: 9.7 mg/dL (ref 8.7–10.2)
Chloride: 103 mmol/L (ref 96–106)
Creatinine, Ser: 1.37 mg/dL — ABNORMAL HIGH (ref 0.76–1.27)
GFR calc Af Amer: 71 mL/min/{1.73_m2} (ref >59)
GFR calc non Af Amer: 61 mL/min/{1.73_m2} (ref >59)
Globulin, Total: 2.3 g/dL (ref 1.5–4.5)
Glucose: 103 mg/dL — ABNORMAL HIGH (ref 65–99)
Potassium: 4.3 mmol/L (ref 3.5–5.2)
Sodium: 138 mmol/L (ref 134–144)
Total Protein: 6.8 g/dL (ref 6.0–8.5)

## 2019-07-06 LAB — LIPID PANEL
Chol/HDL Ratio: 4.2 ratio (ref 0.0–5.0)
Cholesterol, Total: 198 mg/dL (ref 100–199)
HDL: 47 mg/dL (ref >39)
LDL Chol Calc (NIH): 128 mg/dL — ABNORMAL HIGH (ref 0–99)
Triglycerides: 130 mg/dL (ref 0–149)
VLDL Cholesterol Cal: 23 mg/dL (ref 5–40)

## 2019-07-06 LAB — LDL CHOLESTEROL, DIRECT: LDL Direct: 137 mg/dL — ABNORMAL HIGH (ref 0–99)

## 2019-07-06 NOTE — Telephone Encounter (Signed)
-----   Message from Alver Sorrow, NP sent at 07/06/2019  7:44 AM EST ----- CBC (complete blood count) unable to be resulted as sample had clotted - he may come to [facility] to have repeat lab if he wishes. If he does not wish to do so, we can go without.  Renal function stable. Mildly dehydrated based on labs - recommend increased fluid intake. Normal liver function. Normal electrolytes. Cholesterol numbers good with exception of mildly elevated LDL (128 instead of less than 100). Would recommend dietary changes - avoid fried foods, eat lean cuts of meat. I have sent a MyChart message with information on a cholesterol lowering diet.

## 2019-07-06 NOTE — Telephone Encounter (Signed)
Pt returned a call to the office and lab results were discussed as per Lily Kocher reported. Pt verbalized understanding and did not have any additional questions or concerns.

## 2019-07-06 NOTE — Telephone Encounter (Signed)
Attempted to call pt without success. Pt does not have a voicemail set-up.

## 2019-08-01 ENCOUNTER — Other Ambulatory Visit: Payer: Self-pay

## 2019-08-01 ENCOUNTER — Ambulatory Visit (INDEPENDENT_AMBULATORY_CARE_PROVIDER_SITE_OTHER): Payer: Self-pay

## 2019-08-01 DIAGNOSIS — I10 Essential (primary) hypertension: Secondary | ICD-10-CM

## 2019-08-01 DIAGNOSIS — I421 Obstructive hypertrophic cardiomyopathy: Secondary | ICD-10-CM

## 2019-08-08 ENCOUNTER — Telehealth: Payer: Self-pay | Admitting: Cardiovascular Disease

## 2019-08-08 NOTE — Telephone Encounter (Signed)
Patient calling to discuss recent testing results  ° °Please call  ° °

## 2019-08-09 NOTE — Telephone Encounter (Signed)
Spoke with patient and reviewed that results are still pending provider review. Reviewed preliminary findings and let him know that once provider results it should come to his My Chart and if he had any questions to please give [place] a call. He was appreciative for the call with no further questions at this time.

## 2019-08-09 NOTE — Telephone Encounter (Signed)
No answer/No voicemail box has been set up. 

## 2019-08-14 ENCOUNTER — Telehealth: Payer: Self-pay

## 2019-08-14 NOTE — Telephone Encounter (Signed)
Call attempted. No answer, no vm. 

## 2019-08-14 NOTE — Telephone Encounter (Signed)
Incoming call returned by patient.   Reviewed result and confirmed to continue w current POC.   No further orders at this time. Follow up as planned.   Advised pt to call for any further questions or concerns.

## 2019-08-14 NOTE — Telephone Encounter (Signed)
-----   Message from Alver Sorrow, NP sent at 08/14/2019  7:42 AM EST ----- Normal pumping function. The hypertrophy or thickening of his heart muscle has increased compared to last echocardiogram. Heart shows mild stiffening, likely due to history of high blood pressure. Heart valves are stable compared to previous echocardiogram.   Optimal blood pressure (less than 130/80) is important. Ideally he would be on Verapamil and beta blockers, but he has had difficulty with these in the past. Recommend continuing Amlodipine and Lisinopril. Follow up as previously scheduled.

## 2019-08-22 ENCOUNTER — Ambulatory Visit (INDEPENDENT_AMBULATORY_CARE_PROVIDER_SITE_OTHER): Payer: Medicaid Other | Admitting: Psychiatry

## 2019-08-22 ENCOUNTER — Other Ambulatory Visit: Payer: Self-pay

## 2019-08-22 ENCOUNTER — Encounter: Payer: Self-pay | Admitting: Psychiatry

## 2019-08-22 DIAGNOSIS — F900 Attention-deficit hyperactivity disorder, predominantly inattentive type: Secondary | ICD-10-CM

## 2019-08-22 MED ORDER — DEXTROAMPHETAMINE SULFATE 10 MG PO TABS: ORAL_TABLET | ORAL | 0 refills | Status: DC

## 2019-08-22 NOTE — Progress Notes (Signed)
[place] MD OP Progress Note  I connected with  Jake Elliott on 08/22/19 by phone and verified that I am speaking with the correct person using two identifiers.   I discussed the limitations of evaluation and management by phone. The patient expressed understanding and agreed to proceed.   08/22/2019 4:02 PM Jake Elliott  MRN:  [id number]  Chief Complaint:  " I am doing okay."  HPI: Pt reported that he has been doing well with Dextrostat.  However he reported that he has been busy with some issues with his heart.  Patient reported that he has history of cardiomyopathy and he has to undergo another echocardiogram.  He stated he has been stressed due to that.  His cardiologist is aware of him taking Dextrostat and has not expressed any concerns and has suggested that he can continue taking it as prescribed. Other than cardiac issues he reported he has been doing well overall.   Visit Diagnosis: Attention deficit hyperactivity disorder (ADHD), predominantly inattentive type   Past Psychiatric History: ADHD, inattentive type  Past Medical History:  Past Medical History:  Diagnosis Date  . Angina at rest [facility])   . Anxiety   . Attention-deficit/hyperactivity disorder   . Bipolar disorder ([place])   . Depression   . History of tobacco abuse   . Hypertension   . Hypertrophic cardiomyopathy ([place])    s/p myomectomy  . Peripheral neuropathy   . PTSD (post-traumatic stress disorder)     Past Surgical History:  Procedure Laterality Date  . CARDIAC CATHETERIZATION     11/14/2010  . CORONARY ARTERY BYPASS GRAFT     for myomectomy for hypertrophic cardiomyopathy    Family Psychiatric History: see below  Family History:  Family History  Problem Relation Age of Onset  . Cancer - Lung Mother   . Skin cancer Mother   . Alcohol abuse Mother   . Drug abuse Mother   . Stroke Father   . Hypertension Father   . Alcohol abuse Father   . Drug abuse Father   . Drug abuse Sister     Social  History:  Social History   Socioeconomic History  . Marital status: Single    Spouse name: Not on file  . Number of children: Not on file  . Years of education: Not on file  . Highest education level: Not on file  Occupational History  . Not on file  Tobacco Use  . Smoking status: Current Every Day Smoker    Packs/day: 1.00    Years: 15.00    Pack years: 15.00    Types: Cigarettes    Start date: 12/31/1988  . Smokeless tobacco: Never Used  Substance and Sexual Activity  . Alcohol use: No    Alcohol/week: 0.0 standard drinks  . Drug use: No  . Sexual activity: Yes    Partners: Female    Birth control/protection: None  Other Topics Concern  . Not on file  Social History Narrative  . Not on file   Social Determinants of Health   Financial Resource Strain:   . Difficulty of Paying Living Expenses: Not on file  Food Insecurity:   . Worried About Charity fundraiser in the Last Year: Not on file  . Ran Out of Food in the Last Year: Not on file  Transportation Needs:   . Lack of Transportation (Medical): Not on file  . Lack of Transportation (Non-Medical): Not on file  Physical Activity:   . Days of Exercise  per Week: Not on file  . Minutes of Exercise per Session: Not on file  Stress:   . Feeling of Stress : Not on file  Social Connections:   . Frequency of Communication with Friends and Family: Not on file  . Frequency of Social Gatherings with Friends and Family: Not on file  . Attends Religious Services: Not on file  . Active Member of Clubs or Organizations: Not on file  . Attends Banker Meetings: Not on file  . Marital Status: Not on file    Allergies:  Allergies  Allergen Reactions  . Acetaminophen Other (See Comments)    headache  . Bystolic [Nebivolol Hcl]     Headaches  . Codeine     Nausea & headache Other reaction(s): HEADACHE  . Morphine And Related     Nausea & headache.    Metabolic Disorder Labs: No results found for: HGBA1C,  MPG Lab Results  Component Value Date   PROLACTIN 6.1 07/15/2016   Lab Results  Component Value Date   CHOL 198 07/05/2019   TRIG 130 07/05/2019   HDL 47 07/05/2019   CHOLHDL 4.2 07/05/2019   VLDL 33 07/15/2016   LDLCALC 128 (H) 07/05/2019   LDLCALC 175 (H) 07/15/2016   Lab Results  Component Value Date   TSH 3.553 07/15/2016   TSH 1.80 01/23/2013    Therapeutic Level Labs: No results found for: LITHIUM No results found for: VALPROATE No components found for:  CBMZ  Current Medications: Current Outpatient Medications  Medication Sig Dispense Refill  . amLODipine (NORVASC) 10 MG tablet Take 1 tablet (10 mg total) by mouth daily. 90 tablet 2  . Aspirin-Caffeine (BC FAST PAIN RELIEF PO) Take by mouth as needed.    [name] dextroamphetamine (DEXTROSTAT) 10 MG tablet Take 2 tablets in the morning and 1 tablet in the afternoon 90 tablet 0  . dextroamphetamine (DEXTROSTAT) 10 MG tablet Take 2 tablets in the morning and 1 tablet in the afternoon. 90 tablet 0  . diphenhydrAMINE (BENADRYL) 25 MG tablet Take 3 tablets (75 mg total) by mouth at bedtime as needed. 90 tablet 1  . lisinopril (ZESTRIL) 10 MG tablet Take 1 tablet (10 mg total) by mouth daily. 90 tablet 2  . Melatonin 10 MG CAPS Take 10 mg by mouth at bedtime. 30 capsule 1  . tadalafil (CIALIS) 20 MG tablet TAKE ONE TABLET BY MOUTH DAILY AS NEEDED FOR ERECTILE DYSFUNCTION 90 tablet 2   No current facility-administered medications for this visit.    Musculoskeletal: Strength & Muscle Tone: unable to assess due to telemed visit Gait & Station: unable to assess due to telemed visit Patient leans: unable to assess due to telemed visit  Psychiatric Specialty Exam: ROS  There were no vitals taken for this visit.There is no height or weight on file to calculate BMI.  General Appearance: unable to assess due to phone visit  Eye Contact:  unable to assess due to phone visit  Speech:  Clear and Coherent and Normal Rate  Volume:   Normal  Mood:  Euthymic  Affect:  Congruent  Thought Process:  Goal Directed, Linear and Descriptions of Associations: Intact  Orientation:  Full (Time, Place, and Person)  Thought Content: Logical   Suicidal Thoughts:  No  Homicidal Thoughts:  No  Memory:  Recent;   Good Remote;   Good  Judgement:  Intact  Insight:  Fair  Psychomotor Activity:  Normal  Concentration:  Concentration: Good and Attention Span: Good  Recall:  Good  Fund of Knowledge: Good  Language: Good  Akathisia:  No  Handed:  Right  AIMS (if indicated): not done  Assets:  Communication Skills Desire for Improvement Financial Resources/Insurance Housing Transportation  ADL's:  Intact  Cognition: WNL  Sleep:  Good     Screenings: AUDIT     [facility] in [facility]  Alcohol Use Disorder Identification Test Final Score (AUDIT)  0       Assessment and Plan: Patient reported doing well on his current dose.  1. Attention deficit hyperactivity disorder (ADHD), predominantly inattentive type  - Continue dextroamphetamine (DEXTROSTAT) 10 MG tablet; Take 2 tablets in the morning and 1 tablet in the afternoon  Dispense: 90 tablet; Refill: 0  F/up in 3 months.   Zena Amos, MD 08/22/2019, 4:02 PM

## 2019-11-01 ENCOUNTER — Telehealth: Payer: Self-pay

## 2019-11-01 NOTE — Telephone Encounter (Signed)
pt called left message that he needed refill on his dextroamphetamine and he also wanted to ask if he can have something to help him sleep at night.

## 2019-11-02 NOTE — Telephone Encounter (Signed)
He should have one prescription ready for pick up at his pharmacy. Regarding sleep, would he like to try Trazodone 50 mg HS prn.

## 2019-11-15 ENCOUNTER — Telehealth: Payer: Self-pay | Admitting: Psychiatry

## 2019-12-05 ENCOUNTER — Telehealth (HOSPITAL_COMMUNITY): Payer: Self-pay | Admitting: *Deleted

## 2019-12-05 DIAGNOSIS — F900 Attention-deficit hyperactivity disorder, predominantly inattentive type: Secondary | ICD-10-CM

## 2019-12-05 NOTE — Telephone Encounter (Signed)
Refill request faxed dextroamphetamine (DEXTROSTAT) 10 MG tablet  [facility] [address], [place] - [address]

## 2019-12-06 MED ORDER — DEXTROAMPHETAMINE SULFATE 10 MG PO TABS: ORAL_TABLET | ORAL | 0 refills | Status: DC

## 2019-12-06 NOTE — Addendum Note (Signed)
Addended by: Zena Amos on: 12/06/2019 08:55 AM   Modules accepted: Orders

## 2019-12-06 NOTE — Telephone Encounter (Signed)
3 months prescription sent.

## 2020-01-05 ENCOUNTER — Other Ambulatory Visit: Payer: Self-pay | Admitting: Family

## 2020-01-09 NOTE — Progress Notes (Signed)
Cardiology Office Note  Date:  01/10/2020   ID:  Fabion Gatson, DOB Feb 24, 1971, MRN [id number]  PCP:  Patient, No Pcp Per   Chief Complaint  Patient presents with  . office visit    6 month F/U-Patient reports concerns about blood flow in carotid arteries and abdominal swelling; Meds verbally reviewed with patient.    HPI:  Mr. Colston is a very pleasant 49 year old gentleman who has a history of  HOCM, myomectomy in 2008 ,   moderate LVH, abdominally septum cardiac catheterization June 2012 showing no significant coronary disease,  history of sleep apnea, unable to tolerate CPAP,   nonsustained VT Prior history of heavy opiate use for chronic pain Takes high-dose NSAIDs, aspirin for chronic pain and headaches who presents for routine followup of his tachycardia and hypertension  Previously seen in clinic by myself September 2019, Reported prior issues with beta-blockers, Was not taking verapamil on a regular basis Felt best when he was taking Cialis 10 mg daily, was buying a generic  Was on medication for ADHD, had no insurance at the time Previously declined medical management through [facility] or [facility]  History of anxiety, presenting with tightness in back, palpitations, fatigue, headaches  Previously reported taking numerous ibuprofen and aspirins for chronic pain Reported issues with thyroid and prolactin in the past  Was seen by one of our providers for checkup January 2021 Was on Cialis at that time, working out on a regular basis, some boxing  Recent Echocardiogram February 2021 reviewed on today's visit Normal ejection fraction, severe asymmetric left ventricular hypertrophy disproportionately affecting the septum SAM with trivial MR  No significant change compared to prior echocardiogram performed several years ago  Discussed his workouts at the gym, under heavy straining, heavy weight, occasional near syncope Stay hydrated Feels better when he  works out, before and after working out develops headaches Continues to use aspirin and NSAIDs  EKG personally reviewed by myself on todays visit Sinus tachycardia with rate 103 bpm LVH No significant change  Other past medical history reviewed  Previously was on calcium channel blockers and beta blockers. 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   history of anxiety depression. He has had a long hospital course requiring assistance from psychiatry.   chronic pain syndrome.   Prior Holter monitor in 2013 showing normal sinus rhythm with APCs and PVCs   PMH:   has a past medical history of Angina at rest [facility]), Anxiety, Attention-deficit/hyperactivity disorder, Bipolar disorder (HCC), Depression, History of tobacco abuse, Hypertension, Hypertrophic cardiomyopathy (HCC), Peripheral neuropathy, and PTSD (post-traumatic stress disorder).  PSH:    Past Surgical History:  Procedure Laterality Date  . CARDIAC CATHETERIZATION     11/14/2010  . CORONARY ARTERY BYPASS GRAFT     for myomectomy for hypertrophic cardiomyopathy    Current Outpatient Medications  Medication Sig Dispense Refill  . amLODipine (NORVASC) 10 MG tablet Take 1 tablet (10 mg total) by mouth daily. 90 tablet 3  . Aspirin-Caffeine (BC FAST PAIN RELIEF PO) Take by mouth as needed.    [name] dextroamphetamine (DEXTROSTAT) 10 MG tablet Take 2 tablets in the morning and 1 tablet in the afternoon. 90 tablet 0  . dextroamphetamine (DEXTROSTAT) 10 MG tablet Take 2 tablets in the morning and 1 tablet in the afternoon 90 tablet 0  . lisinopril (ZESTRIL) 20 MG tablet Take 1 tablet (20 mg total) by mouth daily. 90 tablet 3  . Melatonin 10 MG CAPS  Take 10 mg by mouth at bedtime. 30 capsule 1  . tadalafil (CIALIS) 20 MG tablet TAKE ONE TABLET BY MOUTH DAILY AS NEEDED FOR ERECTILE DYSFUNCTION 30 tablet 12  . [START ON 02/03/2020] dextroamphetamine (DEXTROSTAT) 10 MG tablet Take 2 tablets in the morning  and 1 tablet in the afternoon 90 tablet 0  . diphenhydrAMINE (BENADRYL) 25 MG tablet Take 3 tablets (75 mg total) by mouth at bedtime as needed. 90 tablet 1   No current facility-administered medications for this visit.     Allergies:   Acetaminophen, Bystolic [nebivolol hcl], Codeine, and Morphine and related   Social History:  The patient  reports that he has been smoking cigarettes. He started smoking about 31 years ago. He has a 15.00 pack-year smoking history. He has never used smokeless tobacco. He reports that he does not drink alcohol and does not use drugs.   Family History:   family history includes Alcohol abuse in his father and mother; Cancer - Lung in his mother; Drug abuse in his father, mother, and sister; Hypertension in his father; Skin cancer in his mother; Stroke in his father.    Review of Systems: Review of Systems  Constitutional: Negative.   HENT: Negative.   Respiratory: Negative.   Cardiovascular: Positive for palpitations.       Tachycardia  Gastrointestinal: Negative.   Musculoskeletal: Negative.   Neurological: Positive for headaches.  Psychiatric/Behavioral: Negative.   All other systems reviewed and are negative.   PHYSICAL EXAM: VS:  BP (!) 154/96 (BP Location: Left Arm, Patient Position: Sitting, Cuff Size: Large)   Pulse 103   Ht 5\' 11"  (1.803 m)   Wt (!) 217 lb 2 oz (98.5 kg)   SpO2 97%   BMI 30.28 kg/m  , BMI Body mass index is 30.28 kg/m. Constitutional:  oriented to person, place, and time. No distress.  HENT:  Head: Grossly normal Eyes:  no discharge. No scleral icterus.  Neck: No JVD, no carotid bruits  Cardiovascular: Regular  Rhythm, tachycardic, no murmurs appreciated Pulmonary/Chest: Clear to auscultation bilaterally, no wheezes or rails Abdominal: Soft.  no distension.  no tenderness.  Musculoskeletal: Normal range of motion Neurological:  normal muscle tone. Coordination normal. No atrophy Skin: Skin warm and  dry Psychiatric: normal affect, pleasant   Recent Labs: 07/05/2019: ALT 33; BUN 21; Creatinine, Ser 1.37; Hemoglobin CANCELED; Platelets CANCELED; Potassium 4.3; Sodium 138    Lipid Panel Lab Results  Component Value Date   CHOL 198 07/05/2019   HDL 47 07/05/2019   LDLCALC 128 (H) 07/05/2019   TRIG 130 07/05/2019    Wt Readings from Last 3 Encounters:  01/10/20 (!) 217 lb 2 oz (98.5 kg)  07/05/19 221 lb 12 oz (100.6 kg)  03/14/18 224 lb 8 oz (101.8 kg)     ASSESSMENT AND PLAN:  Frequent headaches -  Lisinopril increased for blood pressure control Takes high-dose NSAIDs and aspirin, discussed GI prophylaxis with Tums, Pepcid, omeprazole  Hyperlipidemia, unspecified hyperlipidemia type - Previously declined statin  Hypertrophic obstructive cardiomyopathy (HCC) - Previous myomectomy, no change in echocardiogram of the past several years, 2 cm septal wall, no significant outflow tract gradient reported both studies  Essential hypertension On amlodipine and Cialis Lisinopril up to 20  Tachycardia Previously on verapamil, prefers not to be on beta-blockers  Chronic pain syndrome Was previously on high-dose narcotics Now takes large amounts of NSAIDs including aspirin, Aleve Mainly for headaches   Total encounter time more than 25 minutes  Greater  than 50% was spent in counseling and coordination of care with the patient    Orders Placed This Encounter  Procedures  . EKG 12-Lead     Signed, Dossie Arbour, M.D., Ph.D. 01/10/2020  [facility] [place], [place] [phone]

## 2020-01-10 ENCOUNTER — Ambulatory Visit (INDEPENDENT_AMBULATORY_CARE_PROVIDER_SITE_OTHER): Payer: Self-pay | Admitting: Cardiovascular Disease

## 2020-01-10 ENCOUNTER — Other Ambulatory Visit: Payer: Self-pay

## 2020-01-10 VITALS — BP 154/96 | HR 103 | Ht 71.0 in | Wt 217.1 lb

## 2020-01-10 DIAGNOSIS — I10 Essential (primary) hypertension: Secondary | ICD-10-CM

## 2020-01-10 DIAGNOSIS — I421 Obstructive hypertrophic cardiomyopathy: Secondary | ICD-10-CM

## 2020-01-10 MED ORDER — TADALAFIL 20 MG PO TABS: ORAL_TABLET | ORAL | 12 refills | Status: DC

## 2020-01-10 MED ORDER — AMLODIPINE BESYLATE 10 MG PO TABS: 10.0000 mg | ORAL_TABLET | Freq: Every day | ORAL | 3 refills | Status: DC

## 2020-01-10 MED ORDER — LISINOPRIL 10 MG PO TABS: 10.0000 mg | ORAL_TABLET | Freq: Every day | ORAL | 3 refills | Status: DC

## 2020-01-10 MED ORDER — LISINOPRIL 20 MG PO TABS: 20.0000 mg | ORAL_TABLET | Freq: Every day | ORAL | 3 refills | Status: DC

## 2020-01-10 NOTE — Patient Instructions (Addendum)
Medication Instructions:  No changes  If you need a refill on your cardiac medications before your next appointment, please call your pharmacy.    Lab work: No new labs needed   If you have labs (blood work) drawn today and your tests are completely normal, you will receive your results only by: . MyChart Message (if you have MyChart) OR . A paper copy in the mail If you have any lab test that is abnormal or we need to change your treatment, we will call you to review the results.   Testing/Procedures: No new testing needed   Follow-Up: At [facility], you and your health needs are our priority.  As part of our continuing mission to provide you with exceptional heart care, we have created designated Provider Care Teams.  These Care Teams include your primary Cardiologist (physician) and Advanced Practice Providers (APPs -  Physician Assistants and Nurse Practitioners) who all work together to provide you with the care you need, when you need it.  . You will need a follow up appointment in 12 months  . Providers on your designated Care Team:   . Christopher Berge, NP . Ryan Dunn, PA-C . Jacquelyn Visser, PA-C  Any Other Special Instructions Will Be Listed Below (If Applicable).  COVID-19 Vaccine Information can be found at: [email or web address] For questions related to vaccine distribution or appointments, please email [email or web address] or call [phone].     

## 2020-02-07 ENCOUNTER — Other Ambulatory Visit: Payer: Self-pay

## 2020-02-07 ENCOUNTER — Encounter: Payer: Self-pay | Admitting: Psychiatry

## 2020-02-07 ENCOUNTER — Ambulatory Visit (INDEPENDENT_AMBULATORY_CARE_PROVIDER_SITE_OTHER): Payer: Self-pay | Admitting: Psychiatry

## 2020-02-07 DIAGNOSIS — F900 Attention-deficit hyperactivity disorder, predominantly inattentive type: Secondary | ICD-10-CM

## 2020-02-07 DIAGNOSIS — F5101 Primary insomnia: Secondary | ICD-10-CM

## 2020-02-07 MED ORDER — ESZOPICLONE 1 MG PO TABS: 1.0000 mg | ORAL_TABLET | Freq: Every evening | ORAL | 0 refills | Status: DC | PRN

## 2020-02-07 MED ORDER — DEXTROAMPHETAMINE SULFATE 10 MG PO TABS: ORAL_TABLET | ORAL | 0 refills | Status: DC

## 2020-02-07 NOTE — Patient Instructions (Signed)
Eszopiclone tablets What is this medicine? ESZOPICLONE (es ZOE pi clone) is used to treat insomnia. This medicine helps you to fall asleep and sleep through the night. This medicine may be used for other purposes; ask your health care provider or pharmacist if you have questions. COMMON BRAND NAME(S): Lunesta What should I tell my health care provider before I take this medicine? They need to know if you have any of these conditions:  depression  history of a drug or alcohol abuse problem  liver disease  lung or breathing disease  sleep-walking, driving, eating or other activity while not fully awake after taking a sleep medicine  suicidal thoughts  an unusual or allergic reaction to eszopiclone, other medicines, foods, dyes, or preservatives  pregnant or trying to get pregnant  breast-feeding How should I use this medicine? Take this medicine by mouth with a glass of water. Follow the directions on the prescription label. It is better to take this medicine on an empty stomach and only when you are ready for bed. Do not take your medicine more often than directed. If you have been taking this medicine for several weeks and suddenly stop taking it, you may get unpleasant withdrawal symptoms. Your doctor or health care professional may want to gradually reduce the dose. Do not stop taking this medicine on your own. Always follow your doctor or health care professional's advice. Talk to your pediatrician regarding the use of this medicine in children. Special care may be needed. Overdosage: If you think you have taken too much of this medicine contact a poison control center or emergency room at once. NOTE: This medicine is only for you. Do not share this medicine with others. What if I miss a dose? This does not apply. This medicine should only be taken immediately before going to sleep. Do not take double or extra doses. What may interact with this medicine?  herbal medicines like  kava kava, melatonin, St. John's wort and valerian  lorazepam  medicines for fungal infections like ketoconazole, fluconazole, or itraconazole  olanzapine This list may not describe all possible interactions. Give your health care provider a list of all the medicines, herbs, non-prescription drugs, or dietary supplements you use. Also tell them if you smoke, drink alcohol, or use illegal drugs. Some items may interact with your medicine. What should I watch for while using this medicine? Visit your doctor or health care professional for regular checks on your progress. Keep a regular sleep schedule by going to bed at about the same time nightly. Avoid caffeine-containing drinks in the evening hours, as caffeine can cause trouble with falling asleep. Talk to your doctor if you still have trouble sleeping. After taking this medicine, you may get up out of bed and do an activity that you do not know you are doing. The next morning, you may have no memory of this. Activities include driving a car ("sleep-driving"), making and eating food, talking on the phone, sexual activity, and sleep-walking. Serious injuries have occurred. Stop the medicine and call your doctor right away if you find out you have done any of these activities. Do not take this medicine if you have used alcohol that evening. Do not take it if you have taken another medicine for sleep. The risk of doing these sleep-related activities is higher. Do not take this medicine unless you are able to stay in bed for a full night (7 to 8 hours) before you must be active again. You may have a   decrease in mental alertness the day after use, even if you feel that you are fully awake. Tell your doctor if you will need to perform activities requiring full alertness, such as driving, the next day. Do not stand or sit up quickly after taking this medicine, especially if you are an older patient. This reduces the risk of dizzy or fainting spells. If you or  your family notice any changes in your behavior, such as new or worsening depression, thoughts of harming yourself, anxiety, other unusual or disturbing thoughts, or memory loss, call your doctor right away. After you stop taking this medicine, you may have trouble falling asleep. This is called rebound insomnia. This problem usually goes away on its own after 1 or 2 nights. What side effects may I notice from receiving this medicine? Side effects that you should report to your doctor or health care professional as soon as possible:  allergic reactions like skin rash, itching or hives, swelling of the face, lips, or tongue  changes in vision  confusion  depressed mood  feeling faint or lightheaded, falls  hallucinations  problems with balance, speaking, walking  restlessness, excitability, or feelings of agitation  unusual activities while not fully awake like driving, eating, making phone calls Side effects that usually do not require medical attention (report to your doctor or health care professional if they continue or are bothersome):  dizziness, or daytime drowsiness, sometimes called a hangover effect  headache This list may not describe all possible side effects. Call your doctor for medical advice about side effects. You may report side effects to FDA at 1-800-FDA-1088. Where should I keep my medicine? Keep out of the reach of children. This medicine can be abused. Keep your medicine in a safe place to protect it from theft. Do not share this medicine with anyone. Selling or giving away this medicine is dangerous and against the law. This medicine may cause accidental overdose and death if taken by other adults, children, or pets. Mix any unused medicine with a substance like cat litter or coffee grounds. Then throw the medicine away in a sealed container like a sealed bag or a coffee can with a lid. Do not use the medicine after the expiration date. Store at room temperature  between 15 and 30 degrees C (59 and 86 degrees F). NOTE: This sheet is a summary. It may not cover all possible information. If you have questions about this medicine, talk to your doctor, pharmacist, or health care provider.  2020 Elsevier/Gold Standard (2017-11-26 11:57:05)  

## 2020-02-07 NOTE — Progress Notes (Signed)
Provider Location : [facility] Patient Location : Home  Participants: Patient , Provider  Virtual Visit via Video Note  I connected with Jake Elliott on 02/07/20 at  3:30 PM EDT by a video enabled telemedicine application and verified that I am speaking with the correct person using two identifiers.   I discussed the limitations of evaluation and management by telemedicine and the availability of in person appointments. The patient expressed understanding and agreed to proceed.     I discussed the assessment and treatment plan with the patient. The patient was provided an opportunity to ask questions and all were answered. The patient agreed with the plan and demonstrated an understanding of the instructions.   The patient was advised to call back or seek an in-person evaluation if the symptoms worsen or if the condition fails to improve as anticipated.    BH MD OP Progress Note  02/07/2020 6:11 PM Jake Elliott  MRN:  [id number]  Chief Complaint:  Chief Complaint    Follow-up     HPI: Jake Elliott is a 49 year old Caucasian male, employed, lives in [place], has a history of ADHD, mood disorder, anger management problems, obstructive sleep apnea, cardiac problems, hypertension was evaluated by telemedicine today.  Patient today reports he continues to do well with regards his attention and focus on the Dextrostat.   He does report anger management problems however this has been chronic and he currently declines any medications.  He also declines psychotherapy referral.  He reports he is able to cope and will let writer know if he is interested.  Patient reports he struggles with sleep on a regular basis.  He reports this has been going on since the past several years.  He tried medications in the past which did not work.  He tried Ambien which caused him to blackout.  He currently takes melatonin on and off and Benadryl some days.  This helps to some extent.  Patient reports he is  currently struggling with possible urinary infection.  He had some Bactrim at home and has started taking it.  He reports it is getting better.  He agrees to go to the nearest emergency department if his symptoms worsen or does not get better.  Patient reports he continues to follow-up with his cardiologist Dr. Julien Nordmannimothy Gollan for his hypertrophic obstructive cardiomyopathy.  Patient denies any suicidality, homicidality or perceptual disturbances.  Patient denies any other concerns today.  Visit Diagnosis:    ICD-10-CM   1. Attention deficit hyperactivity disorder (ADHD), predominantly inattentive type  F90.0 dextroamphetamine (DEXTROSTAT) 10 MG tablet  2. Primary insomnia  F51.01 eszopiclone (LUNESTA) 1 MG TABS tablet    Past Psychiatric History: I have reviewed past psychiatric history from my progress note on 06/02/2017.  I have also reviewed Dr.Kaur's recent notes.  Past Medical History:  Past Medical History:  Diagnosis Date  . Angina at rest [facility](HCC)   . Anxiety   . Attention-deficit/hyperactivity disorder   . Bipolar disorder (HCC)   . Depression   . History of tobacco abuse   . Hypertension   . Hypertrophic cardiomyopathy (HCC)    s/p myomectomy  . Peripheral neuropathy   . PTSD (post-traumatic stress disorder)     Past Surgical History:  Procedure Laterality Date  . CARDIAC CATHETERIZATION     11/14/2010  . CORONARY ARTERY BYPASS GRAFT     for myomectomy for hypertrophic cardiomyopathy    Family Psychiatric History: Reviewed family psychiatric history from my progress note on 06/02/2017.  Family History:  Family History  Problem Relation Age of Onset  . Cancer - Lung Mother   . Skin cancer Mother   . Alcohol abuse Mother   . Drug abuse Mother   . Stroke Father   . Hypertension Father   . Alcohol abuse Father   . Drug abuse Father   . Drug abuse Sister     Social History: I have reviewed social history from my progress note on 06/02/2017 Social History    Socioeconomic History  . Marital status: Single    Spouse name: Not on file  . Number of children: Not on file  . Years of education: Not on file  . Highest education level: Not on file  Occupational History  . Not on file  Tobacco Use  . Smoking status: Current Every Day Smoker    Packs/day: 1.00    Years: 15.00    Pack years: 15.00    Types: Cigarettes    Start date: 12/31/1988  . Smokeless tobacco: Never Used  Vaping Use  . Vaping Use: Never used  Substance and Sexual Activity  . Alcohol use: No    Alcohol/week: 0.0 standard drinks  . Drug use: No  . Sexual activity: Yes    Partners: Female    Birth control/protection: None  Other Topics Concern  . Not on file  Social History Narrative  . Not on file   Social Determinants of Health   Financial Resource Strain:   . Difficulty of Paying Living Expenses: Not on file  Food Insecurity:   . Worried About Programme researcher, broadcasting/film/video in the Last Year: Not on file  . Ran Out of Food in the Last Year: Not on file  Transportation Needs:   . Lack of Transportation (Medical): Not on file  . Lack of Transportation (Non-Medical): Not on file  Physical Activity:   . Days of Exercise per Week: Not on file  . Minutes of Exercise per Session: Not on file  Stress:   . Feeling of Stress : Not on file  Social Connections:   . Frequency of Communication with Friends and Family: Not on file  . Frequency of Social Gatherings with Friends and Family: Not on file  . Attends Religious Services: Not on file  . Active Member of Clubs or Organizations: Not on file  . Attends Banker Meetings: Not on file  . Marital Status: Not on file    Allergies:  Allergies  Allergen Reactions  . Acetaminophen Other (See Comments)    headache  . Bystolic [Nebivolol Hcl]     Headaches  . Codeine     Nausea & headache Other reaction(s): HEADACHE  . Morphine And Related     Nausea & headache.    Metabolic Disorder Labs: No results  found for: HGBA1C, MPG Lab Results  Component Value Date   PROLACTIN 6.1 07/15/2016   Lab Results  Component Value Date   CHOL 198 07/05/2019   TRIG 130 07/05/2019   HDL 47 07/05/2019   CHOLHDL 4.2 07/05/2019   VLDL 33 07/15/2016   LDLCALC 128 (H) 07/05/2019   LDLCALC 175 (H) 07/15/2016   Lab Results  Component Value Date   TSH 3.553 07/15/2016   TSH 1.80 01/23/2013    Therapeutic Level Labs: No results found for: LITHIUM No results found for: VALPROATE No components found for:  CBMZ  Current Medications: Current Outpatient Medications  Medication Sig Dispense Refill  . amLODipine (NORVASC) 10 MG tablet Take  1 tablet (10 mg total) by mouth daily. 90 tablet 3  . Aspirin-Caffeine (BC FAST PAIN RELIEF PO) Take by mouth as needed.    [name] dextroamphetamine (DEXTROSTAT) 10 MG tablet Take 2 tablets in the morning and 1 tablet in the afternoon 90 tablet 0  . dextroamphetamine (DEXTROSTAT) 10 MG tablet Take 2 tablets in the morning and 1 tablet in the afternoon 90 tablet 0  . [START ON 03/06/2020] dextroamphetamine (DEXTROSTAT) 10 MG tablet Take 2 tablets in the morning and 1 tablet in the afternoon. 90 tablet 0  . diphenhydrAMINE (BENADRYL) 25 MG tablet Take 3 tablets (75 mg total) by mouth at bedtime as needed. 90 tablet 1  . eszopiclone (LUNESTA) 1 MG TABS tablet Take 1 tablet (1 mg total) by mouth at bedtime as needed for sleep. Take immediately before bedtime 15 tablet 0  . lisinopril (ZESTRIL) 20 MG tablet Take 1 tablet (20 mg total) by mouth daily. 90 tablet 3  . Melatonin 10 MG CAPS Take 10 mg by mouth at bedtime. 30 capsule 1  . tadalafil (CIALIS) 20 MG tablet TAKE ONE TABLET BY MOUTH DAILY AS NEEDED FOR ERECTILE DYSFUNCTION 30 tablet 12   No current facility-administered medications for this visit.     Musculoskeletal: Strength & Muscle Tone: UTA Gait & Station: normal Patient leans: N/A  Psychiatric Specialty Exam: Review of Systems  Genitourinary: Positive for  dysuria, frequency and urgency.  Psychiatric/Behavioral: Positive for sleep disturbance.       Mood swings  All other systems reviewed and are negative.   There were no vitals taken for this visit.There is no height or weight on file to calculate BMI.  General Appearance: Casual  Eye Contact:  Fair  Speech:  Clear and Coherent  Volume:  Normal  Mood:  Mood swings - coping well  Affect:  Congruent  Thought Process:  Goal Directed and Descriptions of Associations: Intact  Orientation:  Full (Time, Place, and Person)  Thought Content: Logical   Suicidal Thoughts:  No  Homicidal Thoughts:  No  Memory:  Immediate;   Fair Recent;   Fair Remote;   Fair  Judgement:  Fair  Insight:  Fair  Psychomotor Activity:  Normal  Concentration:  Concentration: Fair and Attention Span: Fair  Recall:  [employer] of Knowledge: Fair  Language: Fair  Akathisia:  No  Handed:  Right  AIMS (if indicated): UTA  Assets:  Communication Skills Desire for Improvement Social Support Talents/Skills Transportation Vocational/Educational  ADL's:  Intact  Cognition: WNL  Sleep:  Poor   Screenings: AUDIT     [facility] Visit from 06/25/2015 in [facility]  Alcohol Use Disorder Identification Test Final Score (AUDIT) 0       Assessment and Plan: Fillmore Bynum is a 49 year old Caucasian male who has a history of ADHD, mood disorder, sleep issues, OSA, hypertrophic obstructive cardiomyopathy was evaluated by telemedicine today.  Patient was under the care of Dr.Kaur and transitioned to our clinic.  Patient is currently struggling with sleep issues.  Plan as noted below.  Plan ADHD-stable Dextrostat 30 mg p.o. daily in divided dosage. I have reviewed [place] controlled substance database I have reviewed his blood pressure reading which is elevated-chronic.  He continues to follow-up with cardiologist.  I have reviewed medical records from recent cardiology visit.  We will coordinate  care.  For insomnia-unstable Start Lunesta 1 mg p.o. nightly For OSA-noncompliant with CPAP Provided medication education. Discussed with patient not to combine Lunesta with  diphenhydramine or melatonin.  For mood disorder-chronic He is able to cope and declines medications or therapy.  Follow-up in clinic in 1 month or sooner if needed.  I have spent atleast 20 minutes face to face with patient today. More than 50 % of the time was spent for preparing to see the patient ( e.g., review of test, records ), obtaining and to review and separately obtained history , ordering medications and test ,psychoeducation and supportive psychotherapy and care coordination,as well as documenting clinical information in electronic health record. This note was generated in part or whole with voice recognition software. Voice recognition is usually quite accurate but there are transcription errors that can and very often do occur. I apologize for any typographical errors that were not detected and corrected.        Jomarie Longs, MD 02/07/2020, 6:11 PM

## 2020-02-22 ENCOUNTER — Telehealth: Payer: Self-pay | Admitting: Psychiatry

## 2020-02-22 DIAGNOSIS — F5101 Primary insomnia: Secondary | ICD-10-CM

## 2020-02-22 MED ORDER — ESZOPICLONE 2 MG PO TABS: 2.0000 mg | ORAL_TABLET | Freq: Every evening | ORAL | 0 refills | Status: DC | PRN

## 2020-02-22 NOTE — Telephone Encounter (Signed)
Returned call to patient.  He is interested in dosage increase of Lunesta.  Will increase to 2 mg.  We will send to pharmacy.

## 2020-03-09 ENCOUNTER — Emergency Department: Payer: HRSA Program

## 2020-03-09 ENCOUNTER — Emergency Department
Admission: EM | Admit: 2020-03-09 | Discharge: 2020-03-09 | Disposition: A | Discharge status: Home / Self Care | Payer: HRSA Program | Attending: Emergency Medicine | Admitting: Emergency Medicine

## 2020-03-09 ENCOUNTER — Other Ambulatory Visit: Payer: Self-pay

## 2020-03-09 DIAGNOSIS — Z7982 Long term (current) use of aspirin: Secondary | ICD-10-CM | POA: Diagnosis not present

## 2020-03-09 DIAGNOSIS — F17210 Nicotine dependence, cigarettes, uncomplicated: Secondary | ICD-10-CM | POA: Diagnosis not present

## 2020-03-09 DIAGNOSIS — U071 COVID-19: Secondary | ICD-10-CM | POA: Insufficient documentation

## 2020-03-09 DIAGNOSIS — R5381 Other malaise: Secondary | ICD-10-CM

## 2020-03-09 DIAGNOSIS — R059 Cough, unspecified: Secondary | ICD-10-CM

## 2020-03-09 DIAGNOSIS — Z955 Presence of coronary angioplasty implant and graft: Secondary | ICD-10-CM | POA: Diagnosis not present

## 2020-03-09 DIAGNOSIS — Z79899 Other long term (current) drug therapy: Secondary | ICD-10-CM | POA: Diagnosis not present

## 2020-03-09 DIAGNOSIS — I10 Essential (primary) hypertension: Secondary | ICD-10-CM | POA: Insufficient documentation

## 2020-03-09 DIAGNOSIS — R0602 Shortness of breath: Secondary | ICD-10-CM

## 2020-03-09 DIAGNOSIS — R05 Cough: Secondary | ICD-10-CM | POA: Diagnosis present

## 2020-03-09 LAB — TROPONIN I (HIGH SENSITIVITY)
Troponin I (High Sensitivity): 35 ng/L — ABNORMAL HIGH (ref <18)
Troponin I (High Sensitivity): 34 ng/L — ABNORMAL HIGH (ref <18)

## 2020-03-09 LAB — BASIC METABOLIC PANEL
Anion gap: 11 (ref 5–15)
BUN: 14 mg/dL (ref 6–20)
CO2: 21 mmol/L — ABNORMAL LOW (ref 22–32)
Calcium: 8.6 mg/dL — ABNORMAL LOW (ref 8.9–10.3)
Chloride: 102 mmol/L (ref 98–111)
Creatinine, Ser: 1.34 mg/dL — ABNORMAL HIGH (ref 0.61–1.24)
GFR calc Af Amer: >60 mL/min (ref >60)
GFR calc non Af Amer: >60 mL/min (ref >60)
Glucose, Bld: 108 mg/dL — ABNORMAL HIGH (ref 70–99)
Potassium: 5.3 mmol/L — ABNORMAL HIGH (ref 3.5–5.1)
Sodium: 134 mmol/L — ABNORMAL LOW (ref 135–145)

## 2020-03-09 LAB — RESP PANEL BY RT PCR (RSV, FLU A&B, COVID)
Influenza A by PCR: NEGATIVE
Influenza B by PCR: NEGATIVE
Respiratory Syncytial Virus by PCR: NEGATIVE
SARS Coronavirus 2 by RT PCR: POSITIVE — AB

## 2020-03-09 LAB — CBC
HCT: 59.2 % — ABNORMAL HIGH (ref 39.0–52.0)
Hemoglobin: 20.3 g/dL — ABNORMAL HIGH (ref 13.0–17.0)
MCH: 30.2 pg (ref 26.0–34.0)
MCHC: 34.3 g/dL (ref 30.0–36.0)
MCV: 88.1 fL (ref 80.0–100.0)
Platelets: 93 10*3/uL — ABNORMAL LOW (ref 150–400)
RBC: 6.72 MIL/uL — ABNORMAL HIGH (ref 4.22–5.81)
RDW: 13.3 % (ref 11.5–15.5)
WBC: 2.8 10*3/uL — ABNORMAL LOW (ref 4.0–10.5)
nRBC: 0 % (ref 0.0–0.2)

## 2020-03-09 MED ORDER — KETOROLAC TROMETHAMINE 30 MG/ML IJ SOLN
15.0000 mg | Freq: Once | INTRAMUSCULAR | Status: AC
  Administered 2020-03-09: 15 mg via INTRAVENOUS
  Filled 2020-03-09: qty 1

## 2020-03-09 MED ORDER — ACETAMINOPHEN 500 MG PO TABS
1000.0000 mg | ORAL_TABLET | Freq: Once | ORAL | Status: AC
  Administered 2020-03-09: 1000 mg via ORAL
  Filled 2020-03-09: qty 2

## 2020-03-09 MED ORDER — LACTATED RINGERS IV BOLUS
1000.0000 mL | Freq: Once | INTRAVENOUS | Status: AC
  Administered 2020-03-09: 1000 mL via INTRAVENOUS

## 2020-03-09 NOTE — ED Triage Notes (Signed)
Pt did home test for covid and was positive Wednesday. Pt states cough, sob, malaise, fevers. Hard to catch breath. Talking in broken sentences but sats at 97%

## 2020-03-09 NOTE — ED Notes (Signed)
Pt c/o cough, SOB, fever, N/V, dehydration and body aches since Monday. Pt states his daughter was source of covid exposure. Pt is AOX4, NAD noted. Cough noted, increases with exertion. Lung sounds clear bilaterally. Pt reports he has had trouble with oral intake of food/fluid.

## 2020-03-09 NOTE — ED Notes (Signed)
Pt educated on OTC medications, home care, and course of covid infection.

## 2020-03-09 NOTE — ED Provider Notes (Signed)
[facility] Emergency Department Provider Note ____________________________________________   First MD Initiated Contact with Patient 03/09/20 2105     (approximate)  I have reviewed the triage vital signs and the nursing notes.  HISTORY  Chief Complaint Cough, Fatigue, Sore Throat, and Shortness of Breath   HPI Jake Elliott is a 49 y.o. malewho presents to the ED for evaluation of covid symptoms.   Chart review indicates hx HTN, anxiety, tobacco abuse. HOCM s/p 2008 myomectomy. ADHD.   Patient is not vaccinated for COVID-19.  He reports initiation of symptoms about 7 days ago with nonproductive cough, subjective fevers and chills, fatigue and shortness of breath.  He reports diffuse myalgias that are 5/10 intensity, but denies any discrete chest pain or abdominal pain.  He reports using home Tylenol and ibuprofen with transient improvement of his symptoms.  Denies syncope, vomiting, but does report decreased p.o. intake and appetite, as well as about 3 episodes of watery diarrhea.  Denies dysuria or hematuria.    Past Medical History:  Diagnosis Date  . Angina at rest [facility])   . Anxiety   . Attention-deficit/hyperactivity disorder   . Bipolar disorder (HCC)   . Depression   . History of tobacco abuse   . Hypertension   . Hypertrophic cardiomyopathy (HCC)    s/p myomectomy  . Peripheral neuropathy   . PTSD (post-traumatic stress disorder)     Patient Active Problem List   Diagnosis Date Noted  . Primary insomnia 02/07/2020  . Attention deficit hyperactivity disorder (ADHD), predominantly inattentive type 04/05/2019  . Chronic pain syndrome 07/14/2016  . Peripheral autonomic neuropathy of unknown cause 11/29/2014  . Cognitive decline 11/23/2014  . History of open heart surgery 11/23/2014  . Chronic LBP 01/25/2013  . Chronic cervical pain 01/25/2013  . Affective bipolar disorder (HCC) 01/25/2013  . BP (high blood pressure) 01/25/2013  .  Cardiomyopathy, 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HCC) 01/25/2013  . Long term current use of opiate analgesic 01/25/2013  . Disorder of peripheral nervous system 01/25/2013  . Bipolar affective disorder (HCC) 01/25/2013  . Hypertrophic obstructive cardiomyopathy (HCC) 01/25/2013  . 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 Essential hypertension 11/24/2012  . Chest pain 11/24/2012  . Tachycardia 09/15/2011  . Edema 01/29/2011  . HOCM (hypertrophic obstructive cardiomyopathy) (HCC) 01/29/2011  . OSA (obstructive sleep apnea) 01/29/2011    Past Surgical History:  Procedure Laterality Date  . CARDIAC CATHETERIZATION     11/14/2010  . CORONARY ARTERY BYPASS GRAFT     for myomectomy for hypertrophic cardiomyopathy    Prior to Admission medications   Medication Sig Start Date End Date Taking? Authorizing Provider  amLODipine (NORVASC) 10 MG tablet Take 1 tablet (10 mg total) by mouth daily. 01/10/20   Antonieta Iba, MD  Aspirin-Caffeine (BC FAST PAIN RELIEF PO) Take by mouth as needed.    [provider]  dextroamphetamine (DEXTROSTAT) 10 MG tablet Take 2 tablets in the morning and 1 tablet in the afternoon 01/04/20   Zena Amos, MD  dextroamphetamine (DEXTROSTAT) 10 MG tablet Take 2 tablets in the morning and 1 tablet in the afternoon 02/03/20   Zena Amos, MD  dextroamphetamine (DEXTROSTAT) 10 MG tablet Take 2 tablets in the morning and 1 tablet in the afternoon. 03/06/20   Jomarie Longs, MD  diphenhydrAMINE (BENADRYL) 25 MG tablet Take 3 tablets (75 mg total) by mouth at bedtime as needed. 01/11/17   Brandy Hale, MD  eszopiclone (LUNESTA) 2 MG TABS tablet Take 1 tablet (2 mg  total) by mouth at bedtime as needed for sleep. Take immediately before bedtime 02/22/20   Jomarie Longs, MD  lisinopril (ZESTRIL) 20 MG tablet Take 1 tablet (20 mg total) by mouth daily. 01/10/20   Antonieta Iba, MD  Melatonin 10 MG CAPS Take 10 mg by mouth at bedtime. 01/11/17   Brandy Hale, MD  tadalafil  (CIALIS) 20 MG tablet TAKE ONE TABLET BY MOUTH DAILY AS NEEDED FOR ERECTILE DYSFUNCTION 01/10/20   Antonieta Iba, MD    Allergies Acetaminophen, Bystolic [nebivolol hcl], Codeine, and Morphine and related  Family History  Problem Relation Age of Onset  . Cancer - Lung Mother   . Skin cancer Mother   . Alcohol abuse Mother   . Drug abuse Mother   . Stroke Father   . Hypertension Father   . Alcohol abuse Father   . Drug abuse Father   . Drug abuse Sister     Social History Social History   Tobacco Use  . Smoking status: Current Every Day Smoker    Packs/day: 1.00    Years: 15.00    Pack years: 15.00    Types: Cigarettes    Start date: 12/31/1988  . Smokeless tobacco: Never Used  Vaping Use  . Vaping Use: Never used  Substance Use Topics  . Alcohol use: No    Alcohol/week: 0.0 standard drinks  . Drug use: No    Review of Systems  Constitutional: Positive for fevers and chills Eyes: No visual changes. ENT: No sore throat. Cardiovascular: Denies chest pain. Respiratory: Positive for shortness of breath and nonproductive cough Gastrointestinal: No abdominal pain.  No nausea, no vomiting.  No diarrhea.  No constipation. Genitourinary: Negative for dysuria. Musculoskeletal: Negative for back pain.  Positive for diffuse myalgias Skin: Negative for rash. Neurological: Negative for headaches, focal weakness or numbness.  ____________________________________________   PHYSICAL EXAM:  VITAL SIGNS: Vitals:   03/09/20 2200 03/09/20 2230  BP: 126/85 127/86  Pulse: 62 64  Resp:    Temp:    SpO2: 97% 98%      Constitutional: Alert and oriented.  Uncomfortable-appearing but conversational in full sentences. Eyes: Conjunctivae are normal. PERRL. EOMI. Head: Atraumatic. Nose: No congestion/rhinnorhea. Mouth/Throat: Mucous membranes are moist.  Oropharynx non-erythematous. Neck: No stridor. No cervical spine tenderness to palpation. Cardiovascular: Normal rate,  regular rhythm. Grossly normal heart sounds.  Good peripheral circulation. Respiratory: Minimal tachypnea to the low 20s, otherwise no evidence of distress.  No retractions. Lungs CTAB. Gastrointestinal: Soft , nondistended, nontender to palpation. No abdominal bruits. No CVA tenderness. Musculoskeletal: No lower extremity tenderness nor edema.  No joint effusions. No signs of acute trauma. Neurologic:  Normal speech and language. No gross focal neurologic deficits are appreciated. No gait instability noted. Skin:  Skin is warm, dry and intact. No rash noted. Psychiatric: Mood and affect are normal. Speech and behavior are normal.  ____________________________________________   LABS (all labs ordered are listed, but only abnormal results are displayed)  Labs Reviewed  RESP PANEL BY RT PCR (RSV, FLU A&B, COVID) - Abnormal; Notable for the following components:      Result Value   SARS Coronavirus 2 by RT PCR POSITIVE (*)    All other components within normal limits  BASIC METABOLIC PANEL - Abnormal; Notable for the following components:   Sodium 134 (*)    Potassium 5.3 (*)    CO2 21 (*)    Glucose, Bld 108 (*)    Creatinine, Ser 1.34 (*)  Calcium 8.6 (*)    All other components within normal limits  CBC - Abnormal; Notable for the following components:   WBC 2.8 (*)    RBC 6.72 (*)    Hemoglobin 20.3 (*)    HCT 59.2 (*)    Platelets 93 (*)    All other components within normal limits  TROPONIN I (HIGH SENSITIVITY) - Abnormal; Notable for the following components:   Troponin I (High Sensitivity) 35 (*)    All other components within normal limits  TROPONIN I (HIGH SENSITIVITY) - Abnormal; Notable for the following components:   Troponin I (High Sensitivity) 34 (*)    All other components within normal limits   ____________________________________________  12 Lead EKG  Sinus rhythm, rate of 77 bpm.  Left axis.  Borderline prolonged QRS of 120 ms.  Otherwise normal  intervals.  Stigmata of LVH consistent with his known hokum.  No evidence of acute ischemia.  Similar to previous EKGs ____________________________________________  RADIOLOGY  ED MD interpretation: 2 view CXR with patchy multifocal foci consistent with known COVID-19 without evidence of discrete lobar filtration to suggest a bacterial pneumonia.  Official radiology report(s): DG Chest 2 View  Result Date: 03/09/2020 CLINICAL DATA:  COVID 19 positivity with shortness of breath EXAM: CHEST - 2 VIEW COMPARISON:  04/08/2015 FINDINGS: Cardiac shadow is within normal limits. Postsurgical changes are again seen. Patchy airspace opacities are noted bilaterally consistent with the given clinical history. No sizable effusion is seen. No bony abnormality is noted. IMPRESSION: Patchy opacities bilaterally consistent with the given clinical history. Electronically Signed   By: Alcide CleverMark  Lukens M.D.   On: 03/09/2020 16:24    ____________________________________________   PROCEDURES and INTERVENTIONS  Procedure(s) performed (including Critical Care):  Procedures  Medications  lactated ringers bolus 1,000 mL (0 mLs Intravenous Stopped 03/09/20 2242)  acetaminophen (TYLENOL) tablet 1,000 mg (1,000 mg Oral Given 03/09/20 2155)  ketorolac (TORADOL) 30 MG/ML injection 15 mg (15 mg Intravenous Given 03/09/20 2155)    ____________________________________________   MDM / ED COURSE  Unvaccinated 49 year old male presents with 1 week of COVID-19 symptoms, without evidence of additional acute pathology, and amenable to outpatient management.  Normal vital signs on room air.  Exam demonstrates an uncomfortable-appearing patient to has no distress, trauma or neurovascular deficits.  He is conversational full sentences and only has mild tachypnea to the low 20s as his only signs of pathology.  His EKG is nonischemic at his baseline with his known HOCM, and troponin is negative.  CXR demonstrates expected infiltrates in  the setting of COVID-19.  Our PCR test confirms COVID-19 positive.  Blood work with stigmata of dehydration and hemolyzed chemistry demonstrates mild hypokalemia due to hemolysis.  Patient provided 1 L of LR, Tylenol and Toradol with improving symptoms.  Subsequently ambulatory without hypoxia or distress.  I see no barriers to outpatient management.  We discussed return precautions to the ED.  Patient medically stable for discharge home.  Clinical Course as of Mar 09 2245  Sat Mar 09, 2020  2242 Reassessed.  Patient reports improving symptoms.  We discussed outpatient management and return precautions for the ED.   [DS]    Clinical Course User Index [DS] Delton PrairieSmith, Catrinia Racicot, MD     ____________________________________________   FINAL CLINICAL IMPRESSION(S) / ED DIAGNOSES  Final diagnoses:  COVID-19  Cough  Shortness of breath  [facility]     ED Discharge Orders    None       Greely Atiyeh Katrinka BlazingSmith  Note:  This document was prepared using Dragon voice recognition software and may include unintentional dictation errors.   Delton Prairie, MD 03/09/20 2250

## 2020-03-09 NOTE — ED Notes (Addendum)
Oxygen remains above 97% with ambulation on room air.

## 2020-03-09 NOTE — Discharge Instructions (Signed)
Please take Tylenol and ibuprofen/Advil for your pain.  It is safe to take them together, or to alternate them every few hours.  Take up to 1000mg  of Tylenol at a time, up to 4 times per day.  Do not take more than 4000 mg of Tylenol in 24 hours.  For ibuprofen, take 400-600 mg, 4-5 times per day.  Use the above medication regimen to help keep your fevers down and hopefully help you feel well enough so you can stay hydrated and get some rest. It is okay if you do not eat solid food for a few days due to your lack of appetite, but is important to stay hydrated.  Continue home quarantine and wear masks around others.  If you develop any worsening symptoms despite the above medications, please return to the ED.

## 2020-03-11 ENCOUNTER — Telehealth: Payer: Self-pay | Admitting: Internal Medicine

## 2020-03-11 NOTE — Telephone Encounter (Signed)
Called to Discuss with patient about Covid symptoms and the use of the monoclonal antibody infusion for those with mild to moderate Covid symptoms and at a high risk of hospitalization.     Pt appears to qualify for this infusion due to co-morbid conditions and/or a member of an at-risk group in accordance with the FDA Emergency Use Authorization.    Unable to reach pt. No VM set up. Left Mychart message.   Cyndee Brightly, NP [facility]

## 2020-03-12 ENCOUNTER — Telehealth: Payer: Self-pay | Admitting: Cardiovascular Disease

## 2020-03-12 ENCOUNTER — Ambulatory Visit (HOSPITAL_COMMUNITY)
Admission: RE | Admit: 2020-03-12 | Discharge: 2020-03-12 | Disposition: A | Discharge status: Home / Self Care | Payer: HRSA Program | Source: Ambulatory Visit | Attending: Pulmonary Disease | Admitting: Pulmonary Disease

## 2020-03-12 ENCOUNTER — Other Ambulatory Visit: Payer: Self-pay | Admitting: Internal Medicine

## 2020-03-12 DIAGNOSIS — I421 Obstructive hypertrophic cardiomyopathy: Secondary | ICD-10-CM | POA: Insufficient documentation

## 2020-03-12 DIAGNOSIS — U071 COVID-19: Secondary | ICD-10-CM

## 2020-03-12 DIAGNOSIS — I10 Essential (primary) hypertension: Secondary | ICD-10-CM | POA: Diagnosis present

## 2020-03-12 MED ORDER — EPINEPHRINE 0.3 MG/0.3ML IJ SOAJ: 0.3000 mg | Freq: Once | INTRAMUSCULAR | Status: DC | PRN

## 2020-03-12 MED ORDER — SODIUM CHLORIDE 0.9 % IV SOLN: INTRAVENOUS | Status: DC | PRN

## 2020-03-12 MED ORDER — DIPHENHYDRAMINE HCL 50 MG/ML IJ SOLN: 50.0000 mg | Freq: Once | INTRAMUSCULAR | Status: DC | PRN

## 2020-03-12 MED ORDER — ALBUTEROL SULFATE HFA 108 (90 BASE) MCG/ACT IN AERS: 2.0000 | INHALATION_SPRAY | Freq: Once | RESPIRATORY_TRACT | Status: DC | PRN

## 2020-03-12 MED ORDER — SODIUM CHLORIDE 0.9 % IV SOLN
1200.0000 mg | Freq: Once | INTRAVENOUS | Status: AC
  Administered 2020-03-12: 1200 mg via INTRAVENOUS

## 2020-03-12 MED ORDER — METHYLPREDNISOLONE SODIUM SUCC 125 MG IJ SOLR: 125.0000 mg | Freq: Once | INTRAMUSCULAR | Status: DC | PRN

## 2020-03-12 MED ORDER — FAMOTIDINE IN NACL 20-0.9 MG/50ML-% IV SOLN: 20.0000 mg | Freq: Once | INTRAVENOUS | Status: DC | PRN

## 2020-03-12 NOTE — Telephone Encounter (Signed)
Patients phone is messed up and girlfriend answered stating that he is currently getting infusion treatment. Advised we would not be able to assist with this and he should check with them about further needs while there getting treatment. She verbalized understanding and will make him aware.

## 2020-03-12 NOTE — Discharge Instructions (Signed)

## 2020-03-12 NOTE — Telephone Encounter (Signed)
Jake Elliott calling in after being diagnosed with covid. Jake Elliott states the ED did not give him any medications and Jake Elliott is suffering. Jake Elliott sounds very SOB over the phone, coughing a lot. Jake Elliott states his throat feels raw, complaining of lung pain, unable to eat and has lost 20 pounds due to that  Jake Elliott would like to know if Dr. Mariah Milling would be able to prescribe anything for Jake Elliott as he does not have a PCP  Please advise

## 2020-03-12 NOTE — Progress Notes (Signed)
I connected by phone with Jake Elliott on 03/12/2020 at 3:21 PM to discuss the potential use of a new treatment for mild to moderate COVID-19 viral infection in non-hospitalized patients.  This patient is a 49 y.o. male that meets the FDA criteria for Emergency Use Authorization of COVID monoclonal antibody casirivimab/imdevimab or bamlanivimab/eteseviamb.  Has a (+) direct SARS-CoV-2 viral test result  Has mild or moderate COVID-19   Is NOT hospitalized due to COVID-19  Is within 10 days of symptom onset  Has at least one of the high risk factor(s) for progression to severe COVID-19 and/or hospitalization as defined in EUA.  Specific high risk criteria : Cardiovascular disease or hypertension   I have spoken and communicated the following to the patient or parent/caregiver regarding COVID monoclonal antibody treatment:  1. FDA has authorized the emergency use for the treatment of mild to moderate COVID-19 in adults and pediatric patients with positive results of direct SARS-CoV-2 viral testing who are 3 years of age and older weighing at least 40 kg, and who are at high risk for progressing to severe COVID-19 and/or hospitalization.  2. The significant known and potential risks and benefits of COVID monoclonal antibody, and the extent to which such potential risks and benefits are unknown.  3. Information on available alternative treatments and the risks and benefits of those alternatives, including clinical trials.  4. Patients treated with COVID monoclonal antibody should continue to self-isolate and use infection control measures (e.g., wear mask, isolate, social distance, avoid sharing personal items, clean and disinfect "high touch" surfaces, and frequent handwashing) according to CDC guidelines.   5. The patient or parent/caregiver has the option to accept or refuse COVID monoclonal antibody treatment.  After reviewing this information with the patient, the patient has agreed to  receive one of the available covid 19 monoclonal antibodies and will be provided an appropriate fact sheet prior to infusion.   Cyndee Brightly, NP [facility]

## 2020-03-12 NOTE — Progress Notes (Signed)
  Diagnosis: COVID-19  Physician: Delford Field    Procedure: Covid Infusion Clinic Med: casirivimab\imdevimab infusion - Provided patient with casirivimab\imdevimab fact sheet for patients, parents and caregivers prior to infusion.  Complications: No immediate complications noted.  Discharge: Discharged home   Evans Lance 03/12/2020

## 2020-03-19 ENCOUNTER — Telehealth: Payer: Self-pay

## 2020-03-19 DIAGNOSIS — F5101 Primary insomnia: Secondary | ICD-10-CM

## 2020-03-19 MED ORDER — ESZOPICLONE 2 MG PO TABS: 2.0000 mg | ORAL_TABLET | Freq: Every evening | ORAL | 2 refills | Status: DC | PRN

## 2020-03-19 NOTE — Telephone Encounter (Signed)
pt called states he needs a refill on lunesta to [facility] [address]

## 2020-03-19 NOTE — Telephone Encounter (Signed)
Sent Lunesta to pharmacy. °

## 2020-03-26 ENCOUNTER — Encounter: Payer: Self-pay | Admitting: Psychiatry

## 2020-03-26 ENCOUNTER — Other Ambulatory Visit: Payer: Self-pay

## 2020-03-26 ENCOUNTER — Telehealth (INDEPENDENT_AMBULATORY_CARE_PROVIDER_SITE_OTHER): Payer: Self-pay | Admitting: Psychiatry

## 2020-03-26 DIAGNOSIS — F900 Attention-deficit hyperactivity disorder, predominantly inattentive type: Secondary | ICD-10-CM

## 2020-03-26 DIAGNOSIS — F17200 Nicotine dependence, unspecified, uncomplicated: Secondary | ICD-10-CM

## 2020-03-26 DIAGNOSIS — F5101 Primary insomnia: Secondary | ICD-10-CM

## 2020-03-26 MED ORDER — DEXTROAMPHETAMINE SULFATE 10 MG PO TABS: ORAL_TABLET | ORAL | 0 refills | Status: DC

## 2020-03-26 NOTE — Progress Notes (Signed)
Provider Location : [facility] Patient Location : Home  Participants: Patient , Provider  Virtual Visit via Video Note  I connected with Jake Elliott on 03/26/20 at  2:00 PM EDT by a video enabled telemedicine application and verified that I am speaking with the correct person using two identifiers.   I discussed the limitations of evaluation and management by telemedicine and the availability of in person appointments. The patient expressed understanding and agreed to proceed.    I discussed the assessment and treatment plan with the patient. The patient was provided an opportunity to ask questions and all were answered. The patient agreed with the plan and demonstrated an understanding of the instructions.   The patient was advised to call back or seek an in-person evaluation if the symptoms worsen or if the condition fails to improve as anticipated.   BH MD OP Progress Note  03/26/2020 2:19 PM Jake Elliott  MRN:  [id number]  Chief Complaint:  Chief Complaint    Follow-up     HPI: Jake Elliott is a 49 year old Caucasian male, employed, lives in [place] has a history of ADHD, primary insomnia, obstructive sleep apnea, cardiac problems, hypertension was evaluated by telemedicine today.  Patient reports he recently got infected with COVID-19.  He required monoclonal antibody infusion.  He has tested negative however continues to struggle with residual symptoms of cough and breathing problems on and off.  He however currently does not follow-up with a primary care provider.  Patient with recent lab abnormalities including low platelet count, advised to get in touch with [place] cardiologist.  Patient does have anxiety about his recent COVID-19 infection, health problems related to the same.  He also lost 25 pounds since he got infected.  Patient however reports overall his mood symptoms are manageable.  Patient reports he had stopped taking the Dextrostat while he was recovering from  COVID-19.  He however restarted taking it a few days ago.  He reports he continues to do well on the same.  He reports sleep has improved on the Lunesta.  Denies side effects.  Patient denies any suicidality, homicidality or perceptual disturbances.  He continues to smoke cigarettes and reports he is not ready to cut back yet.  Patient denies any other concerns today.  Visit Diagnosis:    ICD-10-CM   1. Attention deficit hyperactivity disorder (ADHD), predominantly inattentive type  F90.0 dextroamphetamine (DEXTROSTAT) 10 MG tablet    dextroamphetamine (DEXTROSTAT) 10 MG tablet  2. Primary insomnia  F51.01   3. Tobacco use disorder  F17.200     Past Psychiatric History: I have reviewed past psychiatric history from my progress note on 06/02/2017.   Past Medical History:  Past Medical History:  Diagnosis Date  . [facility])   . Anxiety   . Attention-deficit/hyperactivity disorder   . Bipolar disorder (HCC)   . Depression   . History of tobacco abuse   . Hypertension   . Hypertrophic cardiomyopathy (HCC)    s/p myomectomy  . Peripheral neuropathy   . PTSD (post-traumatic stress disorder)     Past Surgical History:  Procedure Laterality Date  . CARDIAC CATHETERIZATION     11/14/2010  . CORONARY ARTERY BYPASS GRAFT     for myomectomy for hypertrophic cardiomyopathy    Family Psychiatric History: I have reviewed family psychiatric history from my progress note on 06/02/2017  Family History:  Family History  Problem Relation Age of Onset  . Cancer - Lung Mother   . Skin cancer  Mother   . Alcohol abuse Mother   . Drug abuse Mother   . Stroke Father   . Hypertension Father   . Alcohol abuse Father   . Drug abuse Father   . Drug abuse Sister     Social History: I have reviewed social history from my progress note on 06/02/2017 Social History   Socioeconomic History  . Marital status: Single    Spouse name: Not on file  . Number of children: Not on file   . Years of education: Not on file  . Highest education level: Not on file  Occupational History  . Not on file  Tobacco Use  . Smoking status: Current Every Day Smoker    Packs/day: 1.00    Years: 15.00    Pack years: 15.00    Types: Cigarettes    Start date: 12/31/1988  . Smokeless tobacco: Never Used  Vaping Use  . Vaping Use: Never used  Substance and Sexual Activity  . Alcohol use: No    Alcohol/week: 0.0 standard drinks  . Drug use: No  . Sexual activity: Yes    Partners: Female    Birth control/protection: None  Other Topics Concern  . Not on file  Social History Narrative  . Not on file   Social Determinants of Health   Financial Resource Strain:   . Difficulty of Paying Living Expenses: Not on file  Food Insecurity:   . Worried About Programme researcher, broadcasting/film/videounning Out of Food in the Last Year: Not on file  . Ran Out of Food in the Last Year: Not on file  Transportation Needs:   . Lack of Transportation (Medical): Not on file  . Lack of Transportation (Non-Medical): Not on file  Physical Activity:   . Days of Exercise per Week: Not on file  . Minutes of Exercise per Session: Not on file  Stress:   . Feeling of Stress : Not on file  Social Connections:   . Frequency of Communication with Friends and Family: Not on file  . Frequency of Social Gatherings with Friends and Family: Not on file  . Attends Religious Services: Not on file  . Active Member of Clubs or Organizations: Not on file  . Attends BankerClub or Organization Meetings: Not on file  . Marital Status: Not on file    Allergies:  Allergies  Allergen Reactions  . Acetaminophen Other (See Comments)    headache  . Bystolic [Nebivolol Hcl]     Headaches  . Codeine     Nausea & headache Other reaction(s): HEADACHE  . Morphine And Related     Nausea & headache.    Metabolic Disorder Labs: No results found for: HGBA1C, MPG Lab Results  Component Value Date   PROLACTIN 6.1 07/15/2016   Lab Results  Component Value  Date   CHOL 198 07/05/2019   TRIG 130 07/05/2019   HDL 47 07/05/2019   CHOLHDL 4.2 07/05/2019   VLDL 33 07/15/2016   LDLCALC 128 (H) 07/05/2019   LDLCALC 175 (H) 07/15/2016   Lab Results  Component Value Date   TSH 3.553 07/15/2016   TSH 1.80 01/23/2013    Therapeutic Level Labs: No results found for: LITHIUM No results found for: VALPROATE No components found for:  CBMZ  Current Medications: Current Outpatient Medications  Medication Sig Dispense Refill  . amLODipine (NORVASC) 10 MG tablet Take 1 tablet (10 mg total) by mouth daily. 90 tablet 3  . Aspirin-Caffeine (BC FAST PAIN RELIEF PO) Take by mouth  as needed.    Jake Elliott Kitchen dextroamphetamine (DEXTROSTAT) 10 MG tablet Take 2 tablets in the morning and 1 tablet in the afternoon 90 tablet 0  . [START ON 04/18/2020] dextroamphetamine (DEXTROSTAT) 10 MG tablet Take 2 tablets in the morning and 1 tablet in the afternoon. 90 tablet 0  . [START ON 05/17/2020] dextroamphetamine (DEXTROSTAT) 10 MG tablet Take 2 tablets in the morning and 1 tablet in the afternoon 90 tablet 0  . diphenhydrAMINE (BENADRYL) 25 MG tablet Take 3 tablets (75 mg total) by mouth at bedtime as needed. 90 tablet 1  . eszopiclone (LUNESTA) 2 MG TABS tablet Take 1 tablet (2 mg total) by mouth at bedtime as needed for sleep. Take immediately before bedtime 30 tablet 2  . lisinopril (ZESTRIL) 20 MG tablet Take 1 tablet (20 mg total) by mouth daily. 90 tablet 3  . Melatonin 10 MG CAPS Take 10 mg by mouth at bedtime. 30 capsule 1  . tadalafil (CIALIS) 20 MG tablet TAKE ONE TABLET BY MOUTH DAILY AS NEEDED FOR ERECTILE DYSFUNCTION 30 tablet 12   No current facility-administered medications for this visit.     Musculoskeletal: Strength & Muscle Tone: UTA Gait & Station: normal Patient leans: N/A  Psychiatric Specialty Exam: Review of Systems  Respiratory: Positive for cough.   Psychiatric/Behavioral: Negative for agitation, behavioral problems, confusion, decreased  concentration, dysphoric mood, hallucinations, self-injury, sleep disturbance and suicidal ideas. The patient is nervous/anxious. The patient is not hyperactive.   All other systems reviewed and are negative.   There were no vitals taken for this visit.There is no height or weight on file to calculate BMI.  General Appearance: Casual  Eye Contact:  Fair  Speech:  Normal Rate  Volume:  Normal  Mood:  Anxious  Affect:  Congruent  Thought Process:  Goal Directed and Descriptions of Associations: Intact  Orientation:  Full (Time, Place, and Person)  Thought Content: Logical   Suicidal Thoughts:  No  Homicidal Thoughts:  No  Memory:  Immediate;   Fair Recent;   Fair Remote;   Fair  Judgement:  Fair  Insight:  Fair  Psychomotor Activity:  Normal  Concentration:  Concentration: Fair and Attention Span: Fair  Recall:  [employer] of Knowledge: Fair  Language: Fair  Akathisia:  No  Handed:  Right  AIMS (if indicated): UTA  Assets:  Communication Skills Desire for Improvement Housing Social Support  ADL's:  Intact  Cognition: WNL  Sleep:  Fair   Screenings: AUDIT     [facility]/25/[facility] in [facility]  Alcohol Use Disorder Identification Test Final Score (AUDIT) 0       Assessment and Plan: Jake Elliott is a 49 year old Caucasian male who has a history of ADHD, mood disorder, sleep issues, OSA, hypertrophic obstructive cardiomyopathy was evaluated by telemedicine today.  Patient is currently recovering from COVID-19 infection.  Discussed plan as noted below.  Plan ADHD-stable Dextrostat 30 mg p.o. daily divided dosage-provided 2 prescriptions with date specified-last one to be filled on or after 05/17/2020 I have reviewed [place] controlled substance database. I have reviewed his blood pressure -most recent one dated 03/12/2020-114/76, heart rate 67. He continues to follow-up with cardiology.   For insomnia-stable Lunesta 2 mg p.o.  nightly For OSA noncompliant with CPAP-provided education.  Tobacco use disorder-unstable Provided counseling.  Patient with recent platelet abnormalities, advised to establish care with a primary care provider as well as to follow-up with his cardiologist.  Follow-up in clinic in  2 months or sooner if needed.  I have spent atleast 20 minutes face to face by video with patient today. More than 50 % of the time was spent for preparing to see the patient ( e.g., review of test, records ), obtaining and to review and separately obtained history , ordering medications and test ,psychoeducation and supportive psychotherapy and care coordination,as well as documenting clinical information in electronic health record,interpreting and communication of test results. This note was generated in part or whole with voice recognition software. Voice recognition is usually quite accurate but there are transcription errors that can and very often do occur. I apologize for any typographical errors that were not detected and corrected.       Jomarie Longs, MD 03/26/2020, 2:19 PM

## 2020-03-31 NOTE — Telephone Encounter (Signed)
We have not been doing routine testing post covid  It may take him a few weeks to months to recover breathing, energy etc That seems to be the norm

## 2020-04-30 ENCOUNTER — Encounter: Payer: Self-pay | Admitting: Emergency Medicine

## 2020-04-30 ENCOUNTER — Emergency Department
Admission: EM | Admit: 2020-04-30 | Discharge: 2020-04-30 | Disposition: A | Discharge status: Home / Self Care | Payer: Medicaid Other | Attending: Emergency Medicine | Admitting: Emergency Medicine

## 2020-04-30 ENCOUNTER — Other Ambulatory Visit: Payer: Self-pay

## 2020-04-30 DIAGNOSIS — L299 Pruritus, unspecified: Secondary | ICD-10-CM | POA: Insufficient documentation

## 2020-04-30 DIAGNOSIS — I83892 Varicose veins of left lower extremities with other complications: Secondary | ICD-10-CM | POA: Insufficient documentation

## 2020-04-30 DIAGNOSIS — Z79891 Long term (current) use of opiate analgesic: Secondary | ICD-10-CM | POA: Insufficient documentation

## 2020-04-30 DIAGNOSIS — F17210 Nicotine dependence, cigarettes, uncomplicated: Secondary | ICD-10-CM | POA: Insufficient documentation

## 2020-04-30 DIAGNOSIS — I10 Essential (primary) hypertension: Secondary | ICD-10-CM | POA: Insufficient documentation

## 2020-04-30 DIAGNOSIS — I83899 Varicose veins of unspecified lower extremities with other complications: Secondary | ICD-10-CM

## 2020-04-30 DIAGNOSIS — W268XXA Contact with other sharp object(s), not elsewhere classified, initial encounter: Secondary | ICD-10-CM | POA: Insufficient documentation

## 2020-04-30 DIAGNOSIS — Z8774 Personal history of (corrected) congenital malformations of heart and circulatory system: Secondary | ICD-10-CM | POA: Insufficient documentation

## 2020-04-30 MED ORDER — BACITRACIN ZINC 500 UNIT/GM EX OINT
TOPICAL_OINTMENT | Freq: Once | CUTANEOUS | Status: AC
  Administered 2020-04-30: 1 via TOPICAL
  Filled 2020-04-30: qty 0.9

## 2020-04-30 NOTE — ED Notes (Signed)
Assumed care of pt upon being roomed. Bleeding controlled by gauze at this time. +Pulses. Cap refill <2. Dr. Katrinka Blazing at bedside at this time. Pt ambulated from WR to ED15 independently with steady gait. AO x4. Breathing regular and unlabored

## 2020-04-30 NOTE — ED Triage Notes (Signed)
Pt to ED from home c/o bleeding from right leg tonight.  Pt states picked a scab on right lower leg and started bleeding continuously from site.  Hx of heart disease.  Wrap in place by EMS, bleeding controlled at this time.

## 2020-04-30 NOTE — ED Notes (Signed)
Bacitracin applied, Vaseline dressing, and guaze applied to controlled wound. AO x4, dc reviewed by RN and provider. Denies needs or concerns.

## 2020-04-30 NOTE — ED Provider Notes (Signed)
[facility] Emergency Department Provider Note ____________________________________________   First MD Initiated Contact with Patient 04/30/20 [phone]     (approximate)  I have reviewed the triage vital signs and the nursing notes.  HISTORY  Chief Complaint Varicose Veins   HPI Jake Elliott is a 49 y.o. malewho presents to the ED for evaluation of bleeding leg wound  Chart review indicates history of HTN, bipolar disorder, HOCM s/p myomectomy.  Patient reports scratching at a scab to his right lower leg, scratch the scab off and developed immediate bleeding to the area around a varicose vein.  He denies any discrete trauma or injury to the area, and reports he occasionally scratches at his legs and has slow wound healing time.  Patient takes no blood thinners.  Patient reports this occurred just prior to arrival, about 4 hours prior to my evaluation.  EMS applied bandage and achieved hemostasis.  Patient denies syncope, falls, trauma, chest pain, fevers, recent illnesses.   Past Medical History:  Diagnosis Date  . Angina at rest [facility])   . Anxiety   . Attention-deficit/hyperactivity disorder   . Bipolar disorder (HCC)   . Depression   . History of tobacco abuse   . Hypertension   . Hypertrophic cardiomyopathy (HCC)    s/p myomectomy  . Peripheral neuropathy   . PTSD (post-traumatic stress disorder)     Patient Active Problem List   Diagnosis Date Noted  . Tobacco use disorder 03/26/2020  . Primary insomnia 02/07/2020  . Attention deficit hyperactivity disorder (ADHD), predominantly inattentive type 04/05/2019  . Chronic pain syndrome 07/14/2016  . Peripheral autonomic neuropathy of unknown cause 11/29/2014  . Cognitive decline 11/23/2014  . History of open heart surgery 11/23/2014  . Chronic LBP 01/25/2013  . Chronic cervical pain 01/25/2013  . Affective bipolar disorder (HCC) 01/25/2013  . BP (high blood pressure) 01/25/2013  . Cardiomyopathy,  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HCC) 01/25/2013  . Long term current use of opiate analgesic 01/25/2013  . Disorder of peripheral nervous system 01/25/2013  . Bipolar affective disorder (HCC) 01/25/2013  . Hypertrophic obstructive cardiomyopathy (HCC) 01/25/2013  . 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 Essential hypertension 11/24/2012  . Chest pain 11/24/2012  . Tachycardia 09/15/2011  . Edema 01/29/2011  . HOCM (hypertrophic obstructive cardiomyopathy) (HCC) 01/29/2011  . OSA (obstructive sleep apnea) 01/29/2011    Past Surgical History:  Procedure Laterality Date  . CARDIAC CATHETERIZATION     11/14/2010  . CORONARY ARTERY BYPASS GRAFT     for myomectomy for hypertrophic cardiomyopathy    Prior to Admission medications   Medication Sig Start Date End Date Taking? Authorizing Provider  amLODipine (NORVASC) 10 MG tablet Take 1 tablet (10 mg total) by mouth daily. 01/10/20   Antonieta Iba, MD  Aspirin-Caffeine (BC FAST PAIN RELIEF PO) Take by mouth as needed.    [provider]  dextroamphetamine (DEXTROSTAT) 10 MG tablet Take 2 tablets in the morning and 1 tablet in the afternoon 01/04/20   Zena Amos, MD  dextroamphetamine (DEXTROSTAT) 10 MG tablet Take 2 tablets in the morning and 1 tablet in the afternoon. 04/18/20   Jomarie Longs, MD  dextroamphetamine (DEXTROSTAT) 10 MG tablet Take 2 tablets in the morning and 1 tablet in the afternoon 05/17/20   Jomarie Longs, MD  diphenhydrAMINE (BENADRYL) 25 MG tablet Take 3 tablets (75 mg total) by mouth at bedtime as needed. 01/11/17   Brandy Hale, MD  eszopiclone (LUNESTA) 2 MG TABS tablet Take 1 tablet (2  mg total) by mouth at bedtime as needed for sleep. Take immediately before bedtime 03/19/20   Jomarie Longs, MD  lisinopril (ZESTRIL) 20 MG tablet Take 1 tablet (20 mg total) by mouth daily. 01/10/20   Antonieta Iba, MD  Melatonin 10 MG CAPS Take 10 mg by mouth at bedtime. 01/11/17   Brandy Hale, MD  tadalafil (CIALIS) 20  MG tablet TAKE ONE TABLET BY MOUTH DAILY AS NEEDED FOR ERECTILE DYSFUNCTION 01/10/20   Antonieta Iba, MD    Allergies Acetaminophen, Bystolic [nebivolol hcl], Codeine, and Morphine and related  Family History  Problem Relation Age of Onset  . Cancer - Lung Mother   . Skin cancer Mother   . Alcohol abuse Mother   . Drug abuse Mother   . Stroke Father   . Hypertension Father   . Alcohol abuse Father   . Drug abuse Father   . Drug abuse Sister     Social History Social History   Tobacco Use  . Smoking status: Current Every Day Smoker    Packs/day: 1.00    Years: 15.00    Pack years: 15.00    Types: Cigarettes    Start date: 12/31/1988  . Smokeless tobacco: Never Used  Vaping Use  . Vaping Use: Never used  Substance Use Topics  . Alcohol use: No    Alcohol/week: 0.0 standard drinks  . Drug use: No    Review of Systems  Constitutional: No fever/chills Eyes: No visual changes. ENT: No sore throat. Cardiovascular: Denies chest pain. Respiratory: Denies shortness of breath. Gastrointestinal: No abdominal pain.  No nausea, no vomiting.  No diarrhea.  No constipation. Genitourinary: Negative for dysuria. Musculoskeletal: Negative for back pain.  Right leg bleeding varicose vein Skin: Negative for rash. Neurological: Negative for headaches, focal weakness or numbness.  ____________________________________________   PHYSICAL EXAM:  VITAL SIGNS: Vitals:   04/30/20 0023  BP: (!) 142/122  Pulse: 74  Resp: 16  Temp: 98.7 F (37.1 C)  SpO2: 97%     Constitutional: Alert and oriented. Well appearing and in no acute distress. Eyes: Conjunctivae are normal. PERRL. EOMI. Head: Atraumatic. Nose: No congestion/rhinnorhea. Mouth/Throat: Mucous membranes are moist.  Oropharynx non-erythematous. Neck: No stridor. No cervical spine tenderness to palpation. Cardiovascular: Normal rate, regular rhythm. Grossly normal heart sounds.  Good peripheral  circulation. Respiratory: Normal respiratory effort.  No retractions. Lungs CTAB. Gastrointestinal: Soft , nondistended, nontender to palpation. No CVA tenderness. Musculoskeletal: No lower extremity tenderness nor edema.  No joint effusions.  Bulky bandage to his right lateral shin without bloody saturation or signs of active bleeding.  I remove this bandage and he has a small, 3 mm wide, skin defect that is rapidly oozing blood, but no pulsatile bleeding to suggest arterial bleed.  RLE is distally neurovascularly intact. Neurologic:  Normal speech and language. No gross focal neurologic deficits are appreciated. No gait instability noted. Skin:  Skin is warm, dry and intact. No rash noted. Psychiatric: Mood and affect are normal. Speech and behavior are normal.  ____________________________________________   PROCEDURES and INTERVENTIONS  Procedure(s) performed (including Critical Care):  Marland KitchenMarland KitchenLaceration Repair  Date/Time: 04/30/2020 3:32 AM Performed by: Delton Prairie, MD Authorized by: Delton Prairie, MD   Consent:    Consent obtained:  Verbal   Consent given by:  Patient   Risks discussed:  Infection, pain, vascular damage, poor wound healing and poor cosmetic result Anesthesia (see MAR for exact dosages):    Anesthesia method:  None Laceration details:  Location:  Leg   Leg location:  R lower leg   Length (cm):  0.3 Repair type:    Repair type:  Simple Pre-procedure details:    Preparation:  Patient was prepped and draped in usual sterile fashion Exploration:    Hemostasis achieved with:  Tied off vessels   Contaminated: no   Treatment:    Amount of cleaning:  Standard   Visualized foreign bodies/material removed: no   Skin repair:    Repair method:  Sutures   Suture size:  4-0   Wound skin closure material used: monocryl.   Suture technique:  Figure eight   Number of sutures:  1 Approximation:    Approximation:  Close Post-procedure details:    Dressing:   Antibiotic ointment and non-adherent dressing   Patient tolerance of procedure:  Tolerated well, no immediate complications    Medications  bacitracin ointment (has no administration in time range)    ____________________________________________   MDM / ED COURSE   49 year old male not on blood thinners been to the ED with superficial bleeding around varicose vein to his right leg requiring a single figure-of-eight stitch and subsequently amenable to outpatient management.  Normal vital signs on room air.  Exam demonstrates a well-appearing patient who has no evidence of additional acute pathology.  He was hemostatic with a dressing applied prehospital, and when I remove this he has a rapidly oozing skin defect where he pulled off a scab near a varicose vein.  I applied a single figure-of-eight suture and achieve immediate hemostasis.  He was observed for about half an hour after this without rebleeding or signs of significant hematoma.  We discussed return precautions for the ED and patient is medically stable for discharge home.   Clinical Course as of Apr 30 332  Tue Apr 30, 2020  6256 Figure-of-eight suture applied with good hemostasis.  Well-tolerated.   [DS]    Clinical Course User Index [DS] Delton Prairie, MD    ____________________________________________   FINAL CLINICAL IMPRESSION(S) / ED DIAGNOSES  Final diagnoses:  Bleeding from varicose vein     ED Discharge Orders    None       [employer] Claudio Katrinka Blazing   Note:  This document was prepared using Dragon voice recognition software and may include unintentional dictation errors.   Delton Prairie, MD 04/30/20 [phone]

## 2020-04-30 NOTE — Discharge Instructions (Addendum)
In general, keep the area clean and dry. Then apply a Bacitracin/Neosporin type of antibiotic ointment.  Gently wash with warm soap and water once daily, and again if it gets dirty. Don't vigorously scrub at the wound.  Gently pat dry. Once dry, apply Neosporin / bacitracin type antibiotic ointment. It is important to keep the wound moist with an antibiotic ointment, or a small amount of Vaseline, for the first 5 days. Keeping it moist/clean will help the area heal faster and prevent scar tissue formation.  If you are doing anything active, keep it covered.  If you are sitting around at home, you may leave it uncovered.  Please take Tylenol and ibuprofen/Advil for your pain.  It is safe to take them together, or to alternate them every few hours.  Take up to 1000mg  of Tylenol at a time, up to 4 times per day.  Do not take more than 4000 mg of Tylenol in 24 hours.  For ibuprofen, take 400-600 mg, 4-5 times per day.  Return to the ED with any repeat or worsening bleeding  We put one stitch that will absorb on its own is does not need to be removed

## 2020-05-08 ENCOUNTER — Telehealth: Payer: Medicaid Other | Admitting: Family

## 2020-05-08 DIAGNOSIS — R059 Cough, unspecified: Secondary | ICD-10-CM

## 2020-05-08 DIAGNOSIS — R079 Chest pain, unspecified: Secondary | ICD-10-CM

## 2020-05-08 NOTE — Progress Notes (Signed)
Based on what you shared with me, I feel your condition warrants further evaluation and I recommend that you be seen for a face to face office visit.  Given your chest pains you need to be seen face to face.    NOTE: If you entered your credit card information for this eVisit, you will not be charged. You may see a "hold" on your card for the $35 but that hold will drop off and you will not have a charge processed.   If you are having a true medical emergency please call 911.      For an urgent face to face visit, [place] has five urgent care centers for your convenience:     [facility] Center at [facility] Directions [phone] [address] [place], [place] [address] . 10 am - 6pm Monday - Friday    [facility] Health Urgent Care Center [facility]) Get Driving Directions [phone] [address] [place], [place] [address] . 10 am to 8 pm Monday-Friday . 12 pm to 8 pm [facility] Urgent Care at [facility] Get Driving Directions [phone]  [address] [place], [place] [address] . 8 am to 8 pm Monday-Friday . 9 am to 6 pm Saturday . 11 am to 6 pm Sunday     [facility] Health Urgent Care at [facility] Get Driving Directions  [phone] [address] [place], [place] [address] . 8 am to 8 pm Monday-Friday . 8 am to 4 pm [facility] Urgent Care at [facility] Directions [phone] [address] [place], [place] [address] . 12 pm to 6 pm Monday-Friday      Your e-visit answers were reviewed by a board certified advanced clinical practitioner to complete your personal care plan.  Thank you for using e-Visits.

## 2020-06-04 ENCOUNTER — Emergency Department: Payer: No Typology Code available for payment source

## 2020-06-04 ENCOUNTER — Other Ambulatory Visit: Payer: Self-pay

## 2020-06-04 ENCOUNTER — Emergency Department
Admission: EM | Admit: 2020-06-04 | Discharge: 2020-06-04 | Disposition: A | Discharge status: Home / Self Care | Payer: No Typology Code available for payment source | Attending: Emergency Medicine | Admitting: Emergency Medicine

## 2020-06-04 DIAGNOSIS — Z7982 Long term (current) use of aspirin: Secondary | ICD-10-CM | POA: Insufficient documentation

## 2020-06-04 DIAGNOSIS — Z951 Presence of aortocoronary bypass graft: Secondary | ICD-10-CM | POA: Insufficient documentation

## 2020-06-04 DIAGNOSIS — M549 Dorsalgia, unspecified: Secondary | ICD-10-CM | POA: Insufficient documentation

## 2020-06-04 DIAGNOSIS — Y92410 Unspecified street and highway as the place of occurrence of the external cause: Secondary | ICD-10-CM | POA: Diagnosis not present

## 2020-06-04 DIAGNOSIS — M25511 Pain in right shoulder: Secondary | ICD-10-CM | POA: Insufficient documentation

## 2020-06-04 DIAGNOSIS — Z79899 Other long term (current) drug therapy: Secondary | ICD-10-CM | POA: Diagnosis not present

## 2020-06-04 DIAGNOSIS — F17210 Nicotine dependence, cigarettes, uncomplicated: Secondary | ICD-10-CM | POA: Insufficient documentation

## 2020-06-04 DIAGNOSIS — M542 Cervicalgia: Secondary | ICD-10-CM | POA: Insufficient documentation

## 2020-06-04 DIAGNOSIS — R519 Headache, unspecified: Secondary | ICD-10-CM | POA: Insufficient documentation

## 2020-06-04 DIAGNOSIS — I10 Essential (primary) hypertension: Secondary | ICD-10-CM | POA: Diagnosis not present

## 2020-06-04 MED ORDER — MELOXICAM 15 MG PO TABS: 15.0000 mg | ORAL_TABLET | Freq: Every day | ORAL | 2 refills | Status: DC

## 2020-06-04 MED ORDER — METHOCARBAMOL 500 MG PO TABS: 500.0000 mg | ORAL_TABLET | Freq: Three times a day (TID) | ORAL | 0 refills | Status: AC | PRN
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
## 2020-06-04 NOTE — Discharge Instructions (Signed)
Take meloxicam and Robaxin for pain. 

## 2020-06-04 NOTE — ED Notes (Signed)
Pt signed esignature.  D/c  inst to pt.  
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
## 2020-06-10 ENCOUNTER — Telehealth: Payer: Self-pay | Admitting: Cardiovascular Disease
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
## 2020-06-11 NOTE — Telephone Encounter (Signed)
Tried to reach back out to pt with his concern of "calcified carotid artery", home number [phone] is not set up for messages, but was able to leave a message on the [phone] phone number to advised pt to call back with his concerns so they may be address/answer.

## 2020-06-13 ENCOUNTER — Other Ambulatory Visit: Payer: Self-pay | Admitting: Psychiatry

## 2020-06-13 DIAGNOSIS — F5101 Primary insomnia: Secondary | ICD-10-CM

## 2020-06-17 ENCOUNTER — Encounter: Payer: Self-pay | Admitting: Psychiatry

## 2020-06-17 ENCOUNTER — Telehealth (INDEPENDENT_AMBULATORY_CARE_PROVIDER_SITE_OTHER): Payer: Self-pay | Admitting: Psychiatry

## 2020-06-17 ENCOUNTER — Other Ambulatory Visit: Payer: Self-pay

## 2020-06-17 DIAGNOSIS — F900 Attention-deficit hyperactivity disorder, predominantly inattentive type: Secondary | ICD-10-CM

## 2020-06-17 DIAGNOSIS — F430 Acute stress reaction: Secondary | ICD-10-CM

## 2020-06-17 DIAGNOSIS — F17200 Nicotine dependence, unspecified, uncomplicated: Secondary | ICD-10-CM

## 2020-06-17 DIAGNOSIS — F5101 Primary insomnia: Secondary | ICD-10-CM

## 2020-06-17 MED ORDER — DEXTROAMPHETAMINE SULFATE 10 MG PO TABS: ORAL_TABLET | ORAL | 0 refills | Status: DC

## 2020-06-17 MED ORDER — ESZOPICLONE 2 MG PO TABS: 2.0000 mg | ORAL_TABLET | Freq: Every evening | ORAL | 2 refills | Status: DC | PRN
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
## 2020-06-21 ENCOUNTER — Ambulatory Visit (INDEPENDENT_AMBULATORY_CARE_PROVIDER_SITE_OTHER): Payer: Self-pay | Admitting: Physician Assistant

## 2020-06-21 ENCOUNTER — Other Ambulatory Visit: Payer: Self-pay

## 2020-06-21 ENCOUNTER — Encounter: Payer: Self-pay | Admitting: Physician Assistant

## 2020-06-21 VITALS — BP 140/100 | HR 95 | Ht 71.0 in | Wt 214.0 lb

## 2020-06-21 DIAGNOSIS — I10 Essential (primary) hypertension: Secondary | ICD-10-CM

## 2020-06-21 DIAGNOSIS — E782 Mixed hyperlipidemia: Secondary | ICD-10-CM

## 2020-06-21 DIAGNOSIS — R0989 Other specified symptoms and signs involving the circulatory and respiratory systems: Secondary | ICD-10-CM

## 2020-06-21 DIAGNOSIS — I421 Obstructive hypertrophic cardiomyopathy: Secondary | ICD-10-CM

## 2020-06-21 DIAGNOSIS — I6522 Occlusion and stenosis of left carotid artery: Secondary | ICD-10-CM

## 2020-06-21 NOTE — Patient Instructions (Signed)
Medication Instructions:  No changes  *If you need a refill on your cardiac medications before your next appointment, please call your pharmacy*   Lab Work: None  If you have labs (blood work) drawn today and your tests are completely normal, you will receive your results only by: [name] MyChart Message (if you have MyChart) OR . A paper copy in the mail If you have any lab test that is abnormal or we need to change your treatment, we will call you to review the results.   Testing/Procedures: Your physician has requested that you have a carotid duplex. This test is an ultrasound of the carotid arteries in your neck. It looks at blood flow through these arteries that supply the brain with blood. Allow one hour for this exam. There are no restrictions or special instructions.     Follow-Up: At [facility], you and your health needs are our priority.  As part of our continuing mission to provide you with exceptional heart care, we have created designated Provider Care Teams.  These Care Teams include your primary Cardiologist (physician) and Advanced Practice Providers (APPs -  Physician Assistants and Nurse Practitioners) who all work together to provide you with the care you need, when you need it.   Your next appointment:   Next available   The format for your next appointment:   In Person  Provider:   Julien Nordmann, MD

## 2020-07-08 ENCOUNTER — Other Ambulatory Visit: Payer: Self-pay

## 2020-07-08 ENCOUNTER — Ambulatory Visit (INDEPENDENT_AMBULATORY_CARE_PROVIDER_SITE_OTHER): Payer: Self-pay

## 2020-07-08 DIAGNOSIS — R0989 Other specified symptoms and signs involving the circulatory and respiratory systems: Secondary | ICD-10-CM

## 2020-07-15 ENCOUNTER — Other Ambulatory Visit: Payer: Self-pay

## 2020-07-15 ENCOUNTER — Telehealth (INDEPENDENT_AMBULATORY_CARE_PROVIDER_SITE_OTHER): Payer: Self-pay | Admitting: Psychiatry

## 2020-07-15 ENCOUNTER — Encounter: Payer: Self-pay | Admitting: Psychiatry

## 2020-07-15 DIAGNOSIS — F5101 Primary insomnia: Secondary | ICD-10-CM

## 2020-07-15 DIAGNOSIS — F39 Unspecified mood [affective] disorder: Secondary | ICD-10-CM

## 2020-07-15 DIAGNOSIS — F900 Attention-deficit hyperactivity disorder, predominantly inattentive type: Secondary | ICD-10-CM

## 2020-07-15 DIAGNOSIS — F17200 Nicotine dependence, unspecified, uncomplicated: Secondary | ICD-10-CM

## 2020-07-15 MED ORDER — DEXTROAMPHETAMINE SULFATE 10 MG PO TABS: ORAL_TABLET | ORAL | 0 refills | Status: DC

## 2020-07-15 MED ORDER — PREGABALIN 25 MG PO CAPS: 25.0000 mg | ORAL_CAPSULE | Freq: Every day | ORAL | 0 refills | Status: DC

## 2020-07-15 NOTE — Patient Instructions (Signed)
Pregabalin capsules What is this medicine? PREGABALIN (pre GAB a lin) is used to treat nerve pain from diabetes, shingles, spinal cord injury, and fibromyalgia. It is also used to control seizures in epilepsy. This medicine may be used for other purposes; ask your health care provider or pharmacist if you have questions. COMMON BRAND NAME(S): Lyrica What should I tell my health care provider before I take this medicine? They need to know if you have any of these conditions:  drug abuse or addiction  heart failure  kidney disease  lung disease  suicidal thoughts, plans or attempt  an unusual or allergic reaction to pregabalin, other medicines, foods, dyes, or preservatives  pregnant or trying to get pregnant  breast-feeding How should I use this medicine? Take this medicine by mouth with water. Take it as directed on the prescription label at the same time every day. You can take it with or without food. If it upsets your stomach, take it with food. Keep taking it unless your health care provider tells you to stop. A special MedGuide will be given to you by the pharmacist with each prescription and refill. Be sure to read this information carefully each time. Talk to your health care provider about the use of this medicine in children. While it may be prescribed for children as young as 1 month for selected conditions, precautions do apply. Overdosage: If you think you have taken too much of this medicine contact a poison control center or emergency room at once. NOTE: This medicine is only for you. Do not share this medicine with others. What if I miss a dose? If you miss a dose, take it as soon as you can. If it is almost time for your next dose, take only that dose. Do not take double or extra doses. What may interact with this medicine?  alcohol  antihistamines for allergy, cough, and cold  certain medicines for anxiety or sleep  certain medicines for blood pressure, heart  disease  certain medicines for depression like amitriptyline, fluoxetine, sertraline  certain medicines for diabetes, like pioglitazone, rosiglitazone  certain medicines for seizures like phenobarbital, primidone  general anesthetics like halothane, isoflurane, methoxyflurane, propofol  medicines that relax muscles for surgery  narcotic medicines for pain  phenothiazines like chlorpromazine, mesoridazine, prochlorperazine, thioridazine This list may not describe all possible interactions. Give your health care provider a list of all the medicines, herbs, non-prescription drugs, or dietary supplements you use. Also tell them if you smoke, drink alcohol, or use illegal drugs. Some items may interact with your medicine. What should I watch for while using this medicine? Visit your health care provider for regular checks on your progress. Tell your health care provider if your symptoms do not start to get better or if they get worse. Do not suddenly stop taking this medicine. You may develop a severe reaction. Your health care provider will tell you how much medicine to take. If your health care provider wants you to stop the medicine, the dose may be slowly lowered over time to avoid any side effects. You may get drowsy or dizzy. Do not drive, use machinery, or do anything that needs mental alertness until you know how this medicine affects you. Do not stand up or sit up quickly, especially if you are an older patient. This reduces the risk of dizzy or fainting spells. Alcohol may interfere with the effect of this medicine. Avoid alcoholic drinks. If you or your family notice any changes in your   behavior, such as new or worsening depression, thoughts of harming yourself, anxiety, other unusual or disturbing thoughts, or memory loss, call your health care provider right away. Wear a medical ID bracelet or chain if you are taking this medicine for seizures. Carry a card that describes your condition.  List the medicines and doses you take on the card. This medicine may make it more difficult to father a child. Talk to your health care provider if you are concerned about your fertility. What side effects may I notice from receiving this medicine? Side effects that you should report to your doctor or health care provider as soon as possible:  allergic reactions (skin rash, itching or hives; swelling of the face, lips, or tongue)  changes in vision  edema (sudden weight gain; swelling of the ankles, feet, hands or other unusual swelling; trouble breathing)  muscle injury (dark urine; trouble passing urine or change in the amount of urine; unusually weak or tired; muscle pain; back pain)  suicidal thoughts, mood changes  trouble breathing  unusual bruising or bleeding Side effects that usually do not require medical attention (report these to your doctor or health care provider if they continue or are bothersome):  dizziness  dry mouth  tiredness  weight gain This list may not describe all possible side effects. Call your doctor for medical advice about side effects. You may report side effects to FDA at 1-800-FDA-1088. Where should I keep my medicine? Keep out of the reach of children and pets. This medicine can be abused. Keep it in a safe place to protect it from theft. Do not share it with anyone. It is only for you. Selling or giving away this medicine is dangerous and against the law. Store at 25 degrees C (77 degrees F). Get rid of any unused medicine after the expiration date. This medicine may cause harm and death if it is taken by other adults, children, or pets. It is important to get rid of the medicine as soon as you no longer need it or it is expired. You can do this in two ways:  Take the medicine to a medicine take-back program. Check with your pharmacy or law enforcement to find a location.  If you cannot return the medicine, check the label or package insert to see  if the medicine should be thrown out in the garbage or flushed down the toilet. If you are not sure, ask your health care provider. If it is safe to put it in the trash, take the medicine out of the container. Mix the medicine with cat litter, dirt, coffee grounds, or other unwanted substance. Seal the mixture in a bag or container. Put it in the trash. NOTE: This sheet is a summary. It may not cover all possible information. If you have questions about this medicine, talk to your doctor, pharmacist, or health care provider.  2021 Elsevier/Gold Standard (2019-09-15 22:46:17)  
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
## 2020-07-25 NOTE — Progress Notes (Unsigned)
Cardiology Office Note  Date:  07/26/2020   ID:  Jake Elliott, DOB 05-13-1971, MRN [id number]  PCP:  Patient, No Pcp Per   Chief Complaint  Patient presents with  . Follow-up    Carotid doppler results    HPI:  Jake Elliott is a 50 year old gentleman who has a history of  HOCM, myomectomy in 2008 ,   moderate LVH, septal hypertrophy cardiac catheterization June 2012 showing no significant coronary disease,   sleep apnea, unable to tolerate CPAP,  nonsustained VT Prior history of heavy opiate use for chronic pain Takes high-dose NSAIDs, aspirin for chronic pain and headaches/neck pain who presents for routine followup of his tachycardia and hypertension  Discussed recent imaging studies with him MRI neck, performed at [facility] Prominent right central  disc osteophyte and uncovertebral joint hypertrophy at C5-6 causes compression of the spinal cord and severe narrowing also extending into the  right neural foramen (25:81). At C6-7, a disc osteophyte and uncovertebral  joint hypertrophy indents the spinal cord and causes mild canal stenosis.  No high-grade neural foraminal narrowing..  Tooth infected past several days, severe pain right upper jaw Requesting antibiotics  Sometimes does not take amlodipine, lisinopril Takes them as needed when he feels blood pressure high  Takes numerous ibuprofen and aspirins for chronic pain    prior issues with beta-blockers, Previously not taking verapamil on a regular basis  previously on medication for ADHD, had no insurance at the time Previously declined medical management through [facility] or [facility]  History of anxiety, presenting with tightness in back, palpitations, fatigue, headaches  Recent Echocardiogram February 2021 reviewed on today's visit Normal ejection fraction, severe asymmetric left ventricular hypertrophy disproportionately affecting the septum SAM with trivial MR  Other past medical history reviewed   Previously was on calcium channel blockers and beta blockers. 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   history of anxiety depression. He has had a long hospital course requiring assistance from psychiatry.   chronic pain syndrome.   Prior Holter monitor in 2013 showing normal sinus rhythm with APCs and PVCs   PMH:   has a past medical history of Angina at rest [facility]), Anxiety, Attention-deficit/hyperactivity disorder, Bipolar disorder (HCC), Depression, History of tobacco abuse, Hypertension, Hypertrophic cardiomyopathy (HCC), Peripheral neuropathy, and PTSD (post-traumatic stress disorder).  PSH:    Past Surgical History:  Procedure Laterality Date  . CARDIAC CATHETERIZATION     11/14/2010  . CORONARY ARTERY BYPASS GRAFT     for myomectomy for hypertrophic cardiomyopathy    Current Outpatient Medications  Medication Sig Dispense Refill  . amLODipine (NORVASC) 10 MG tablet Take 1 tablet (10 mg total) by mouth daily. 90 tablet 3  . amoxicillin-clavulanate (AUGMENTIN) 875-125 MG tablet Take 1 tablet by mouth 2 (two) times daily. 14 tablet 0  . Aspirin-Caffeine (BC FAST PAIN RELIEF PO) Take by mouth as needed.    [name] dextroamphetamine (DEXTROSTAT) 10 MG tablet Take 2 tablets in the morning and 1 tablet in the afternoon. 90 tablet 0  . diphenhydrAMINE (BENADRYL) 25 MG tablet Take 3 tablets (75 mg total) by mouth at bedtime as needed. 90 tablet 1  . eszopiclone (LUNESTA) 2 MG TABS tablet Take 1 tablet (2 mg total) by mouth at bedtime as needed for sleep. Take immediately before bedtime 30 tablet 2  . lisinopril (ZESTRIL) 20 MG tablet Take 1 tablet (20 mg total) by mouth daily. 90 tablet 3  . Melatonin 10 MG CAPS  Take 10 mg by mouth at bedtime. 30 capsule 1  . meloxicam (MOBIC) 15 MG tablet Take 1 tablet (15 mg total) by mouth daily. 30 tablet 2  . pregabalin (LYRICA) 25 MG capsule Take 1 capsule (25 mg total) by mouth at bedtime. 30 capsule 0  .  tadalafil (CIALIS) 20 MG tablet TAKE ONE TABLET BY MOUTH DAILY AS NEEDED FOR ERECTILE DYSFUNCTION 30 tablet 12   No current facility-administered medications for this visit.     Allergies:   Acetaminophen, Bystolic [nebivolol hcl], Codeine, and Morphine and related   Social History:  The patient  reports that he has been smoking cigarettes. He started smoking about 31 years ago. He has a 15.00 pack-year smoking history. He has never used smokeless tobacco. He reports that he does not drink alcohol and does not use drugs.   Family History:   family history includes Alcohol abuse in his father and mother; Cancer - Lung in his mother; Drug abuse in his father, mother, and sister; Hypertension in his father; Skin cancer in his mother; Stroke in his father.    Review of Systems: Review of Systems  Constitutional: Negative.   HENT: Negative.   Respiratory: Negative.   Cardiovascular: Negative.   Gastrointestinal: Negative.   Musculoskeletal: Negative.   Neurological: Positive for headaches.  Psychiatric/Behavioral: Negative.   All other systems reviewed and are negative.   PHYSICAL EXAM: VS:  BP (!) 140/96   Pulse (!) 102   Ht 5\' 11"  (1.803 m)   Wt 213 lb (96.6 kg)   BMI 29.71 kg/m  , BMI Body mass index is 29.71 kg/m. Constitutional:  oriented to person, place, and time. No distress.  HENT:  Head: Grossly normal Eyes:  no discharge. No scleral icterus.  Neck: No JVD, no carotid bruits  Cardiovascular: Regular  Rhythm, tachycardic, no murmurs appreciated Pulmonary/Chest: Clear to auscultation bilaterally, no wheezes or rails Abdominal: Soft.  no distension.  no tenderness.  Musculoskeletal: Normal range of motion Neurological:  normal muscle tone. Coordination normal. No atrophy Skin: Skin warm and dry Psychiatric: normal affect, pleasant   Recent Labs: 03/09/2020: BUN 14; Creatinine, Ser 1.34; Hemoglobin 20.3; Platelets 93; Potassium 5.3; Sodium 134    Lipid Panel Lab  Results  Component Value Date   CHOL 198 07/05/2019   HDL 47 07/05/2019   LDLCALC 128 (H) 07/05/2019   TRIG 130 07/05/2019    Wt Readings from Last 3 Encounters:  07/26/20 213 lb (96.6 kg)  06/21/20 214 lb (97.1 kg)  04/30/20 210 lb (95.3 kg)     ASSESSMENT AND PLAN:  Frequent headaches -  On NSAIDS, chronic issue  Hyperlipidemia, unspecified hyperlipidemia type - Previously declined statin  Hypertrophic obstructive cardiomyopathy (HCC) - Previous myomectomy,2 cm septal wall, no significant outflow tract gradient reported both studies  Essential hypertension On amlodipine  Lisinopril 20 daily Tends to take meds as he pleases  Tachycardia Previously on verapamil, prefers not to be on beta-blockers Not having symptoms  Chronic pain syndrome, headaches  previously on high-dose narcotics takes large amounts of NSAIDs including aspirin, Aleve    Total encounter time more than 25 minutes  Greater than 50% was spent in counseling and coordination of care with the patient    No orders of the defined types were placed in this encounter.    Signed, 05/02/20, M.D., Ph.D. 07/26/2020  [facility] [place], [place]

## 2020-07-26 ENCOUNTER — Ambulatory Visit (INDEPENDENT_AMBULATORY_CARE_PROVIDER_SITE_OTHER): Payer: Self-pay | Admitting: Cardiovascular Disease

## 2020-07-26 ENCOUNTER — Other Ambulatory Visit: Payer: Self-pay

## 2020-07-26 ENCOUNTER — Encounter: Payer: Self-pay | Admitting: Cardiovascular Disease

## 2020-07-26 VITALS — BP 140/96 | HR 102 | Ht 71.0 in | Wt 213.0 lb

## 2020-07-26 DIAGNOSIS — I421 Obstructive hypertrophic cardiomyopathy: Secondary | ICD-10-CM

## 2020-07-26 DIAGNOSIS — I10 Essential (primary) hypertension: Secondary | ICD-10-CM

## 2020-07-26 DIAGNOSIS — E782 Mixed hyperlipidemia: Secondary | ICD-10-CM

## 2020-07-26 MED ORDER — AMOXICILLIN-POT CLAVULANATE 875-125 MG PO TABS: 1.0000 | ORAL_TABLET | Freq: Two times a day (BID) | ORAL | 0 refills | Status: DC

## 2020-07-26 NOTE — Patient Instructions (Addendum)
Medication Instructions:  Augmentin 1 pill twice a day for 7 days, dental infection  If you need a refill on your cardiac medications before your next appointment, please call your pharmacy.    Lab work: No new labs needed   If you have labs (blood work) drawn today and your tests are completely normal, you will receive your results only by: [name] MyChart Message (if you have MyChart) OR . A paper copy in the mail If you have any lab test that is abnormal or we need to change your treatment, we will call you to review the results.   Testing/Procedures: No new testing needed   Follow-Up: At [facility], you and your health needs are our priority.  As part of our continuing mission to provide you with exceptional heart care, we have created designated Provider Care Teams.  These Care Teams include your primary Cardiologist (physician) and Advanced Practice Providers (APPs -  Physician Assistants and Nurse Practitioners) who all work together to provide you with the care you need, when you need it.  . You will need a follow up appointment in 12 months  . Providers on your designated Care Team:   . Nicolasa Ducking, NP . Eula Listen, PA-C . Marisue Ivan, PA-C  Any Other Special Instructions Will Be Listed Below (If Applicable).  COVID-19 Vaccine Information can be found at: [email or web address] For questions related to vaccine distribution or appointments, please email [email or web address] or call [phone].

## 2020-07-31 ENCOUNTER — Telehealth (INDEPENDENT_AMBULATORY_CARE_PROVIDER_SITE_OTHER): Payer: Self-pay | Admitting: Psychiatry

## 2020-07-31 ENCOUNTER — Other Ambulatory Visit: Payer: Self-pay

## 2020-07-31 ENCOUNTER — Encounter: Payer: Self-pay | Admitting: Psychiatry

## 2020-07-31 DIAGNOSIS — F17200 Nicotine dependence, unspecified, uncomplicated: Secondary | ICD-10-CM

## 2020-07-31 DIAGNOSIS — F39 Unspecified mood [affective] disorder: Secondary | ICD-10-CM

## 2020-07-31 DIAGNOSIS — F5101 Primary insomnia: Secondary | ICD-10-CM

## 2020-07-31 DIAGNOSIS — F900 Attention-deficit hyperactivity disorder, predominantly inattentive type: Secondary | ICD-10-CM

## 2020-07-31 MED ORDER — DEXTROAMPHETAMINE SULFATE 10 MG PO TABS: ORAL_TABLET | ORAL | 0 refills | Status: DC

## 2020-07-31 NOTE — Progress Notes (Signed)
Virtual Visit via Video Note  I connected with Jake Elliott on 07/31/20 at  1:30 PM EST by a video enabled telemedicine application and verified that I am speaking with the correct person using two identifiers.  Location Provider Location : [facility] Patient Location : Home  Participants: Patient , Provider    I discussed the limitations of evaluation and management by telemedicine and the availability of in person appointments. The patient expressed understanding and agreed to proceed   I discussed the assessment and treatment plan with the patient. The patient was provided an opportunity to ask questions and all were answered. The patient agreed with the plan and demonstrated an understanding of the instructions.   The patient was advised to call back or seek an in-person evaluation if the symptoms worsen or if the condition fails to improve as anticipated.  BH MD OP Progress Note  07/31/2020 2:57 PM Jake Elliott  MRN:  [id number]  Chief Complaint:  Chief Complaint    Follow-up     HPI: Jake Elliott is a 50 year old Caucasian male, employed, lives in [place], has a history of ADHD, primary insomnia, obstructive sleep apnea, cardiac problems, hypertension was evaluated by telemedicine today.  Patient today reports he is currently struggling with a lot of pain.  Patient reports he is struggling with neck and lower back pain mostly however recent MRI showed that he has degeneration of disks as well as stenosis throughout his back.  He has upcoming appointment with [facility] at 3:45 PM for further recommendations.  Patient reports he is in a lot of pain that limits his ability to function.  This does have an impact on his mood.  He reports he feels depressed often, struggles with lack of motivation, low energy.  Patient however reports he is not interested in adding an antidepressant at this time.  Patient also reports he is not interested in psychotherapy sessions.  Patient denies any  suicidality, homicidality or perceptual disturbances.  Patient reports his concentration and attention is okay as long as he takes  Dextrostat.  He does have headaches and pain which does affect his concentration on and off.  Patient hence believes if he can just get his pain problem under control he will feel better.   Patient reports he had a follow-up appointment with cardiology and that was very positive.  Patient denies any other concerns today.  Visit Diagnosis:    ICD-10-CM   1. Attention deficit hyperactivity disorder (ADHD), predominantly inattentive type  F90.0 dextroamphetamine (DEXTROSTAT) 10 MG tablet  2. Primary insomnia  F51.01   3. Episodic mood disorder (HCC)  F39   4. Tobacco use disorder  F17.200     Past Psychiatric History: I have reviewed past psychiatric history from my progress note on 06/02/2017  Past Medical History:  Past Medical History:  Diagnosis Date  . Angina at rest [facility])   . Anxiety   . Attention-deficit/hyperactivity disorder   . Bipolar disorder (HCC)   . Depression   . History of tobacco abuse   . Hypertension   . Hypertrophic cardiomyopathy (HCC)    s/p myomectomy  . Peripheral neuropathy   . PTSD (post-traumatic stress disorder)     Past Surgical History:  Procedure Laterality Date  . CARDIAC CATHETERIZATION     11/14/2010  . CORONARY ARTERY BYPASS GRAFT     for myomectomy for hypertrophic cardiomyopathy    Family Psychiatric History: I have reviewed family psychiatric history from my progress note on 06/02/2017  Family  History:  Family History  Problem Relation Age of Onset  . Cancer - Lung Mother   . Skin cancer Mother   . Alcohol abuse Mother   . Drug abuse Mother   . Stroke Father   . Hypertension Father   . Alcohol abuse Father   . Drug abuse Father   . Drug abuse Sister     Social History: Reviewed social history from my progress note on 06/02/2017 Social History   Socioeconomic History  . Marital status: Single     Spouse name: Not on file  . Number of children: Not on file  . Years of education: Not on file  . Highest education level: Not on file  Occupational History  . Not on file  Tobacco Use  . Smoking status: Current Every Day Smoker    Packs/day: 1.00    Years: 15.00    Pack years: 15.00    Types: Cigarettes    Start date: 12/31/1988  . Smokeless tobacco: Never Used  Vaping Use  . Vaping Use: Never used  Substance and Sexual Activity  . Alcohol use: No    Alcohol/week: 0.0 standard drinks  . Drug use: No  . Sexual activity: Yes    Partners: Female    Birth control/protection: None  Other Topics Concern  . Not on file  Social History Narrative  . Not on file   Social Determinants of Health   Financial Resource Strain: Not on file  Food Insecurity: Not on file  Transportation Needs: Not on file  Physical Activity: Not on file  Stress: Not on file  Social Connections: Not on file    Allergies:  Allergies  Allergen Reactions  . Acetaminophen Other (See Comments)    headache  . Bystolic [Nebivolol Hcl]     Headaches  . Codeine     Nausea & headache Other reaction(s): HEADACHE  . Morphine And Related     Nausea & headache.    Metabolic Disorder Labs: No results found for: HGBA1C, MPG Lab Results  Component Value Date   PROLACTIN 6.1 07/15/2016   Lab Results  Component Value Date   CHOL 198 07/05/2019   TRIG 130 07/05/2019   HDL 47 07/05/2019   CHOLHDL 4.2 07/05/2019   VLDL 33 07/15/2016   LDLCALC 128 (H) 07/05/2019   LDLCALC 175 (H) 07/15/2016   Lab Results  Component Value Date   TSH 3.553 07/15/2016   TSH 1.80 01/23/2013    Therapeutic Level Labs: No results found for: LITHIUM No results found for: VALPROATE No components found for:  CBMZ  Current Medications: Current Outpatient Medications  Medication Sig Dispense Refill  . amLODipine (NORVASC) 10 MG tablet Take 1 tablet (10 mg total) by mouth daily. 90 tablet 3  . amoxicillin-clavulanate  (AUGMENTIN) 875-125 MG tablet Take 1 tablet by mouth 2 (two) times daily. 14 tablet 0  . Aspirin-Caffeine (BC FAST PAIN RELIEF PO) Take by mouth as needed.    Melene Muller. [START ON 08/16/2020] dextroamphetamine (DEXTROSTAT) 10 MG tablet Take 2 tablets in the morning and 1 tablet in the afternoon. 90 tablet 0  . diphenhydrAMINE (BENADRYL) 25 MG tablet Take 3 tablets (75 mg total) by mouth at bedtime as needed. 90 tablet 1  . eszopiclone (LUNESTA) 2 MG TABS tablet Take 1 tablet (2 mg total) by mouth at bedtime as needed for sleep. Take immediately before bedtime 30 tablet 2  . lisinopril (ZESTRIL) 20 MG tablet Take 1 tablet (20 mg total) by mouth daily.  90 tablet 3  . Melatonin 10 MG CAPS Take 10 mg by mouth at bedtime. 30 capsule 1  . meloxicam (MOBIC) 15 MG tablet Take 1 tablet (15 mg total) by mouth daily. 30 tablet 2  . pregabalin (LYRICA) 25 MG capsule Take 1 capsule (25 mg total) by mouth at bedtime. 30 capsule 0  . tadalafil (CIALIS) 20 MG tablet TAKE ONE TABLET BY MOUTH DAILY AS NEEDED FOR ERECTILE DYSFUNCTION 30 tablet 12   No current facility-administered medications for this visit.     Musculoskeletal: Strength & Muscle Tone: UTA Gait & Station: UTA Patient leans: N/A  Psychiatric Specialty Exam: Review of Systems  Musculoskeletal: Positive for back pain.       Neck pain  Neurological: Positive for headaches.  Psychiatric/Behavioral: Positive for dysphoric mood.  All other systems reviewed and are negative.   There were no vitals taken for this visit.There is no height or weight on file to calculate BMI.  General Appearance: Casual  Eye Contact:  Fair  Speech:  Clear and Coherent  Volume:  Normal  Mood:  Dysphoric  Affect:  Congruent  Thought Process:  Goal Directed and Descriptions of Associations: Intact  Orientation:  Full (Time, Place, and Person)  Thought Content: Logical   Suicidal Thoughts:  No  Homicidal Thoughts:  No  Memory:  Immediate;   Fair Recent;   Fair Remote;    Fair  Judgement:  Fair  Insight:  Fair  Psychomotor Activity:  Normal  Concentration:  Concentration: Fair and Attention Span: Fair  Recall:  [employer] of Knowledge: Fair  Language: Fair  Akathisia:  No  Handed:  Right  AIMS (if indicated): UTA  Assets:  Communication Skills Desire for Improvement Housing Social Support  ADL's:  Intact  Cognition: WNL  Sleep:  Fair   Screenings: AUDIT   Flowsheet Row [facility] Visit from [facility]/25/2015 in [facility]  Alcohol Use Disorder Identification Test Final Score (AUDIT) 0    PHQ2-9   Flowsheet Row Video Visit from 07/31/2020 in [facility]  PHQ-2 Total Score 5  PHQ-9 Total Score 11    Flowsheet Row Video Visit from 07/31/2020 in [facility]  C-SSRS RISK CATEGORY No Risk       Assessment and Plan: Jake Elliott is a 50 year old Caucasian male who has a history of ADHD, mood disorder, sleep issues, OSA, hypertrophic obstructive cardiomyopathy was evaluated by telemedicine today.  Patient is currently struggling with pain which does have an impact on his mood.  Patient however declines medication changes as noted above.  Patient also declines psychotherapy referral.  Plan as noted below.  Plan ADHD-stable Dextrostat 30 mg p.o. daily in divided dosage I have reviewed [place] controlled substance database. I have reviewed blood pressure and heart rate reading from most recent cardiology visit-Dr. Inocente Salles 07/26/2020-BP 140/96, pulse rate 102.  Insomnia-stable Lunesta 2 mg p.o. nightly OSA noncompliant with CPAP  Episodic mood disorder-history of bipolar disorder-unstable Continue Lyrica 25 mg p.o. daily Discussed adding an antidepressant or mood mood stabilizer more specific for his depressive symptoms however he is not ready. Discussed with patient he could talk to his pain provider about readjusting the Lyrica dosage if he needs the dosage to be  increased for pain management. Discussed referral for CBT, patient declines. Patient reports his mood symptoms mostly because of his current pain problems, he will benefit from sufficient pain management.  Noncompliance with treatment regimen-provided education.  We will continue to  follow-up with patient on a regular basis.  I have reviewed notes per most recent visit with radiology-Dr.Carroll -dated 07/24/2020-patient with disc osteophyte at C5-C6 with spinal canal stenosis and compression of spinal cord, degenerative changes in the lumbar spine mostly notable at L4-L5.  Patient has upcoming appointment with [facility] today at 3:45 PM for management.  Follow-up in clinic in 4 weeks or sooner if needed.  I have spent atleast 20 minutes face to face by video with patient today. More than 50 % of the time was spent for preparing to see the patient ( e.g., review of test, records ), ordering medications and test ,psychoeducation and supportive psychotherapy and care coordination,as well as documenting clinical information in electronic health record. This note was generated in part or whole with voice recognition software. Voice recognition is usually quite accurate but there are transcription errors that can and very often do occur. I apologize for any typographical errors that were not detected and corrected.        Jake Longs, MD 08/01/2020, 8:59 AM

## 2020-08-28 ENCOUNTER — Other Ambulatory Visit: Payer: Self-pay

## 2020-08-28 ENCOUNTER — Encounter: Payer: Self-pay | Admitting: Psychiatry

## 2020-08-28 ENCOUNTER — Telehealth (INDEPENDENT_AMBULATORY_CARE_PROVIDER_SITE_OTHER): Payer: Self-pay | Admitting: Psychiatry

## 2020-08-28 DIAGNOSIS — F39 Unspecified mood [affective] disorder: Secondary | ICD-10-CM

## 2020-08-28 DIAGNOSIS — F5101 Primary insomnia: Secondary | ICD-10-CM

## 2020-08-28 DIAGNOSIS — F17200 Nicotine dependence, unspecified, uncomplicated: Secondary | ICD-10-CM

## 2020-08-28 DIAGNOSIS — F900 Attention-deficit hyperactivity disorder, predominantly inattentive type: Secondary | ICD-10-CM

## 2020-08-28 MED ORDER — ESZOPICLONE 2 MG PO TABS: 2.0000 mg | ORAL_TABLET | Freq: Every evening | ORAL | 2 refills | Status: DC | PRN

## 2020-08-28 NOTE — Progress Notes (Signed)
Virtual Visit via Video Note  I connected with Jake Elliott on 08/28/20 at  3:00 PM EDT by a video enabled telemedicine application and verified that I am speaking with the correct person using two identifiers.  Location Provider Location : [facility] Patient Location : Home  Participants: Patient , Provider   I discussed the limitations of evaluation and management by telemedicine and the availability of in person appointments. The patient expressed understanding and agreed to proceed.   I discussed the assessment and treatment plan with the patient. The patient was provided an opportunity to ask questions and all were answered. The patient agreed with the plan and demonstrated an understanding of the instructions.   The patient was advised to call back or seek an in-person evaluation if the symptoms worsen or if the condition fails to improve as anticipated.  BH MD OP Progress Note  08/28/2020 3:32 PM Jake Elliott  MRN:  [id number]  Chief Complaint:  Chief Complaint    Follow-up; ADD     HPI: Jake Elliott is a 50 year old Caucasian male, employed, lives in [place], has a history of ADHD, primary insomnia, obstructive sleep apnea, cardiac problems, hypertension was evaluated by telemedicine today.  Patient today reports he continues to struggle with pain.  He reports he was advised by his providers to get surgery done or get injections to his back.  Patient reports he does not want to do either.  He is just waiting and watching.  He is taking ibuprofen for pain relief.  Patient reports he is otherwise doing okay.  Denies any significant mood lability or anxiety symptoms.  He reports sleep as fair on the Lunesta.  Patient denies any concentration problems.  Reports while he was going through his back issues he did not take the Dextrostat as prescribed and has a lot of leftover.  He hence has not picked up the next prescription from the pharmacy yet.  He denies side effects.  Patient  denies any suicidality, homicidality or perceptual disturbances.  Patient denies any other concerns today.  Visit Diagnosis:    ICD-10-CM   1. Attention deficit hyperactivity disorder (ADHD), predominantly inattentive type  F90.0   2. Primary insomnia  F51.01 eszopiclone (LUNESTA) 2 MG TABS tablet  3. Episodic mood disorder (HCC)  F39   4. Tobacco use disorder  F17.200     Past Psychiatric History: I have reviewed past psychiatric history from my progress note on 06/02/2017.  Past Medical History:  Past Medical History:  Diagnosis Date  . Angina at rest [facility](HCC)   . Anxiety   . Attention-deficit/hyperactivity disorder   . Bipolar disorder (HCC)   . Depression   . History of tobacco abuse   . Hypertension   . Hypertrophic cardiomyopathy (HCC)    s/p myomectomy  . Peripheral neuropathy   . PTSD (post-traumatic stress disorder)     Past Surgical History:  Procedure Laterality Date  . CARDIAC CATHETERIZATION     11/14/2010  . CORONARY ARTERY BYPASS GRAFT     for myomectomy for hypertrophic cardiomyopathy    Family Psychiatric History: I have reviewed family psychiatric history from my progress note on 06/02/2017.  Family History:  Family History  Problem Relation Age of Onset  . Cancer - Lung Mother   . Skin cancer Mother   . Alcohol abuse Mother   . Drug abuse Mother   . Stroke Father   . Hypertension Father   . Alcohol abuse Father   . Drug abuse Father   .  Drug abuse Sister     Social History: Reviewed social history from my progress note on 06/02/2017. Social History   Socioeconomic History  . Marital status: Single    Spouse name: Not on file  . Number of children: Not on file  . Years of education: Not on file  . Highest education level: Not on file  Occupational History  . Not on file  Tobacco Use  . Smoking status: Current Every Day Smoker    Packs/day: 1.00    Years: 15.00    Pack years: 15.00    Types: Cigarettes    Start date: 12/31/1988  .  Smokeless tobacco: Never Used  Vaping Use  . Vaping Use: Never used  Substance and Sexual Activity  . Alcohol use: No    Alcohol/week: 0.0 standard drinks  . Drug use: No  . Sexual activity: Yes    Partners: Female    Birth control/protection: None  Other Topics Concern  . Not on file  Social History Narrative  . Not on file   Social Determinants of Health   Financial Resource Strain: Not on file  Food Insecurity: Not on file  Transportation Needs: Not on file  Physical Activity: Not on file  Stress: Not on file  Social Connections: Not on file    Allergies:  Allergies  Allergen Reactions  . Acetaminophen Other (See Comments)    headache  . Bystolic [Nebivolol Hcl]     Headaches  . Codeine     Nausea & headache Other reaction(s): HEADACHE  . Morphine And Related     Nausea & headache.    Metabolic Disorder Labs: No results found for: HGBA1C, MPG Lab Results  Component Value Date   PROLACTIN 6.1 07/15/2016   Lab Results  Component Value Date   CHOL 198 07/05/2019   TRIG 130 07/05/2019   HDL 47 07/05/2019   CHOLHDL 4.2 07/05/2019   VLDL 33 07/15/2016   LDLCALC 128 (H) 07/05/2019   LDLCALC 175 (H) 07/15/2016   Lab Results  Component Value Date   TSH 3.553 07/15/2016   TSH 1.80 01/23/2013    Therapeutic Level Labs: No results found for: LITHIUM No results found for: VALPROATE No components found for:  CBMZ  Current Medications: Current Outpatient Medications  Medication Sig Dispense Refill  . amLODipine (NORVASC) 10 MG tablet Take 1 tablet (10 mg total) by mouth daily. 90 tablet 3  . amoxicillin-clavulanate (AUGMENTIN) 875-125 MG tablet Take 1 tablet by mouth 2 (two) times daily. 14 tablet 0  . Aspirin-Caffeine (BC FAST PAIN RELIEF PO) Take by mouth as needed.    [name] azithromycin (ZITHROMAX) 250 MG tablet azithromycin 250 mg tablet  TAKE 2 TABLETS BY MOUTH ON DAY ONE. THEN TAKE 1 TABLET BY MOUTH ONCE DAILY FOR 4 DAYS.    [name] benzonatate (TESSALON) 200  MG capsule benzonatate 200 mg capsule  TAKE 1 CAPSULE BY MOUTH THREE TIMES DAILY AS NEEDED FOR COUGH FOR 10 DAYS    . chlorpheniramine-HYDROcodone (TUSSIONEX) 10-8 MG/5ML SUER hydrocodone 10 mg-chlorpheniramine 8 mg/5 mL oral susp extend.rel 12hr  TAKE 5 ML BY MOUTH EVERY 12 HOURS AS NEEDED FOR COUGH    . dextroamphetamine (DEXTROSTAT) 10 MG tablet Take 2 tablets in the morning and 1 tablet in the afternoon. 90 tablet 0  . diphenhydrAMINE (BENADRYL) 25 MG tablet Take 3 tablets (75 mg total) by mouth at bedtime as needed. 90 tablet 1  . eszopiclone (LUNESTA) 2 MG TABS tablet Take 1 tablet (2 mg  total) by mouth at bedtime as needed for sleep. Take immediately before bedtime 30 tablet 2  . lisinopril (ZESTRIL) 20 MG tablet Take 1 tablet (20 mg total) by mouth daily. 90 tablet 3  . Melatonin 10 MG CAPS Take 10 mg by mouth at bedtime. 30 capsule 1  . meloxicam (MOBIC) 15 MG tablet Take 1 tablet (15 mg total) by mouth daily. 30 tablet 2  . methocarbamol (ROBAXIN) 500 MG tablet methocarbamol 500 mg tablet  TAKE 1 TABLET BY MOUTH EVERY 8 HOURS AS NEEDED FOR UP TO 5 DAYS    . predniSONE (DELTASONE) 20 MG tablet prednisone 20 mg tablet  TAKE 2 TABLETS BY MOUTH ONCE DAILY FOR 5 DAYS    . pregabalin (LYRICA) 25 MG capsule Take 1 capsule (25 mg total) by mouth at bedtime. 30 capsule 0  . tadalafil (CIALIS) 20 MG tablet TAKE ONE TABLET BY MOUTH DAILY AS NEEDED FOR ERECTILE DYSFUNCTION 30 tablet 12   No current facility-administered medications for this visit.     Musculoskeletal: Strength & Muscle Tone: UTA Gait & Station: UTA Patient leans: N/A  Psychiatric Specialty Exam: Review of Systems  Musculoskeletal: Positive for back pain and neck pain.  Psychiatric/Behavioral: Negative for agitation, behavioral problems, confusion, decreased concentration, dysphoric mood, hallucinations, self-injury, sleep disturbance and suicidal ideas. The patient is nervous/anxious. The patient is not hyperactive.   All  other systems reviewed and are negative.   There were no vitals taken for this visit.There is no height or weight on file to calculate BMI.  General Appearance: Casual  Eye Contact:  Fair  Speech:  Normal Rate  Volume:  Normal  Mood:  Anxious Coping ok  Affect:  Congruent  Thought Process:  Goal Directed and Descriptions of Associations: Intact  Orientation:  Full (Time, Place, and Person)  Thought Content: Logical   Suicidal Thoughts:  No  Homicidal Thoughts:  No  Memory:  Immediate;   Fair Recent;   Fair Remote;   Fair  Judgement:  Fair  Insight:  Fair  Psychomotor Activity:  Normal  Concentration:  Concentration: Fair and Attention Span: Fair  Recall:  [employer] of Knowledge: Fair  Language: Fair  Akathisia:  No  Handed:  Right  AIMS (if indicated): UTA  Assets:  Communication Skills Desire for Improvement Housing Social Support  ADL's:  Intact  Cognition: WNL  Sleep:  Fair   Screenings: AUDIT   Flowsheet Row Office Visit from 06/25/2015 in [facility]  Alcohol Use Disorder Identification Test Final Score (AUDIT) 0    PHQ2-9   Flowsheet Row Video Visit from 07/31/2020 in [facility]  PHQ-2 Total Score 5  PHQ-9 Total Score 11    Flowsheet Row Video Visit from 07/31/2020 in [facility]  C-SSRS RISK CATEGORY No Risk       Assessment and Plan: Jacquise Rarick is a 50 year old Caucasian male who has a history of ADHD, mood disorder, sleep issues, OSA was evaluated by telemedicine today.  Patient is currently struggling with pain and will continue to need management of the same.  Patient otherwise is stable on medications.  Plan as noted below.  Plan ADHD-stable Dextrostat 30 mg p.o. daily divided dosage I have reviewed [place] controlled substance database.  Insomnia-stable Lunesta 2 mg p.o. nightly OSA noncompliant with CPAP  Episodic mood disorder-stable Discussed referral for  CBT. Patient declines. Patient will need sufficient pain management.  Patient advised to follow-up with his providers for pain management.  Follow-up in clinic in 3 months or sooner if needed.  This note was generated in part or whole with voice recognition software. Voice recognition is usually quite accurate but there are transcription errors that can and very often do occur. I apologize for any typographical errors that were not detected and corrected.        Jomarie Longs, MD 08/29/2020, 10:56 AM

## 2020-10-17 ENCOUNTER — Telehealth: Payer: Self-pay

## 2020-10-17 DIAGNOSIS — F900 Attention-deficit hyperactivity disorder, predominantly inattentive type: Secondary | ICD-10-CM

## 2020-10-17 MED ORDER — DEXTROAMPHETAMINE SULFATE 10 MG PO TABS: ORAL_TABLET | ORAL | 0 refills | Status: DC

## 2020-10-17 NOTE — Telephone Encounter (Signed)
I have sent Dextrostat to pharmacy.

## 2020-10-17 NOTE — Telephone Encounter (Signed)
received a fax requesting a refill on the dextropamphet 10mg 

## 2020-11-14 ENCOUNTER — Telehealth (INDEPENDENT_AMBULATORY_CARE_PROVIDER_SITE_OTHER): Payer: Self-pay | Admitting: Psychiatry

## 2020-11-14 ENCOUNTER — Encounter: Payer: Self-pay | Admitting: Psychiatry

## 2020-11-14 ENCOUNTER — Other Ambulatory Visit: Payer: Self-pay

## 2020-11-14 ENCOUNTER — Telehealth: Payer: Self-pay

## 2020-11-14 DIAGNOSIS — F5101 Primary insomnia: Secondary | ICD-10-CM

## 2020-11-14 DIAGNOSIS — F900 Attention-deficit hyperactivity disorder, predominantly inattentive type: Secondary | ICD-10-CM

## 2020-11-14 DIAGNOSIS — Z79899 Other long term (current) drug therapy: Secondary | ICD-10-CM | POA: Insufficient documentation

## 2020-11-14 DIAGNOSIS — F17200 Nicotine dependence, unspecified, uncomplicated: Secondary | ICD-10-CM

## 2020-11-14 DIAGNOSIS — F39 Unspecified mood [affective] disorder: Secondary | ICD-10-CM

## 2020-11-14 MED ORDER — DEXTROAMPHETAMINE SULFATE 10 MG PO TABS: ORAL_TABLET | ORAL | 0 refills | Status: DC

## 2020-11-14 MED ORDER — ESZOPICLONE 2 MG PO TABS: 2.0000 mg | ORAL_TABLET | Freq: Every evening | ORAL | 2 refills | Status: DC | PRN

## 2020-11-14 NOTE — Telephone Encounter (Signed)
faxed and confirmed labwork orders sent to [facility] urine drugs screen  and tsh dx: z79.899

## 2020-11-14 NOTE — Progress Notes (Signed)
Virtual Visit via Video Note  I connected with Jake GeraldsMark Leckrone on 11/14/20 at  3:00 PM EDT by a video enabled telemedicine application and verified that I am speaking with the correct person using two identifiers.  Location Provider Location : [facility] Patient Location : Home  Participants: Patient , Provider    I discussed the limitations of evaluation and management by telemedicine and the availability of in person appointments. The patient expressed understanding and agreed to proceed.   I discussed the assessment and treatment plan with the patient. The patient was provided an opportunity to ask questions and all were answered. The patient agreed with the plan and demonstrated an understanding of the instructions.   The patient was advised to call back or seek an in-person evaluation if the symptoms worsen or if the condition fails to improve as anticipated.   BH MD OP Progress Note  11/14/2020 3:59 PM Jake GeraldsMark Eggleton  MRN:  [id number]  Chief Complaint:  Chief Complaint    Follow-up; Depression; ADHD     HPI: Jake Elliott is a 50 year old Caucasian male employed, lives in [place], has a history of ADHD, primary insomnia, obstructive sleep apnea, hypertrophic obstructive cardiomyopathy-history of myomectomy, essential hypertension, chronic pain syndrome, headaches, tachycardia was evaluated by telemedicine today.  Patient today reports he continues to be in pain however he is currently not taking any pain medication and decided not to go through with surgery.  He reports he has been managing his pain with BC powder as well as ibuprofen.  His providers are okay with him using these medications.  Patient reports the pain does have an impact on his mood and functioning however he tries to keep himself busy.  He has been doing a lot of work around [employer] house, taking a projects.  Patient reports his medications is helpful for his attention and focus.  He continues to take the Dextrostat.   Denies side effects.  His cardiologist is okay with him continuing the Dextrostat in spite of his cardiac issues and history of tachycardia.  Patient denies any significant depression or anxiety symptoms that he cannot cope with.  Denies any suicidality, homicidality or perceptual disturbances.  Patient denies any other concerns today.  Visit Diagnosis:    ICD-10-CM   1. Attention deficit hyperactivity disorder (ADHD), predominantly inattentive type  F90.0 dextroamphetamine (DEXTROSTAT) 10 MG tablet  2. Primary insomnia  F51.01 eszopiclone (LUNESTA) 2 MG TABS tablet    TSH  3. Episodic mood disorder (HCC)  F39   4. Tobacco use disorder  F17.200   5. High risk medication use  Z79.899 Urine drugs of abuse scrn w alc, routine (Ref Lab)    Past Psychiatric History: I have reviewed past psychiatric history from progress note on 06/02/2017.  Past Medical History:  Past Medical History:  Diagnosis Date  . Angina at rest [facility](HCC)   . Anxiety   . Attention-deficit/hyperactivity disorder   . Bipolar disorder (HCC)   . Depression   . History of tobacco abuse   . Hypertension   . Hypertrophic cardiomyopathy (HCC)    s/p myomectomy  . Peripheral neuropathy   . PTSD (post-traumatic stress disorder)     Past Surgical History:  Procedure Laterality Date  . CARDIAC CATHETERIZATION     11/14/2010  . CORONARY ARTERY BYPASS GRAFT     for myomectomy for hypertrophic cardiomyopathy    Family Psychiatric History: I have reviewed family psychiatric history from progress note on 06/02/2017.  Family History:  Family History  Problem Relation Age of Onset  . Cancer - Lung Mother   . Skin cancer Mother   . Alcohol abuse Mother   . Drug abuse Mother   . Stroke Father   . Hypertension Father   . Alcohol abuse Father   . Drug abuse Father   . Drug abuse Sister     Social History: I have reviewed social history from progress note on 06/02/2017. Social History   Socioeconomic History  .  Marital status: Single    Spouse name: Not on file  . Number of children: Not on file  . Years of education: Not on file  . Highest education level: Not on file  Occupational History  . Not on file  Tobacco Use  . Smoking status: Current Every Day Smoker    Packs/day: 1.00    Years: 15.00    Pack years: 15.00    Types: Cigarettes    Start date: 12/31/1988  . Smokeless tobacco: Never Used  Vaping Use  . Vaping Use: Never used  Substance and Sexual Activity  . Alcohol use: No    Alcohol/week: 0.0 standard drinks  . Drug use: No  . Sexual activity: Yes    Partners: Female    Birth control/protection: None  Other Topics Concern  . Not on file  Social History Narrative  . Not on file   Social Determinants of Health   Financial Resource Strain: Not on file  Food Insecurity: Not on file  Transportation Needs: Not on file  Physical Activity: Not on file  Stress: Not on file  Social Connections: Not on file    Allergies:  Allergies  Allergen Reactions  . Acetaminophen Other (See Comments)    headache  . Bystolic [Nebivolol Hcl]     Headaches  . Codeine     Nausea & headache Other reaction(s): HEADACHE  . Morphine And Related     Nausea & headache.    Metabolic Disorder Labs: No results found for: HGBA1C, MPG Lab Results  Component Value Date   PROLACTIN 6.1 07/15/2016   Lab Results  Component Value Date   CHOL 198 07/05/2019   TRIG 130 07/05/2019   HDL 47 07/05/2019   CHOLHDL 4.2 07/05/2019   VLDL 33 07/15/2016   LDLCALC 128 (H) 07/05/2019   LDLCALC 175 (H) 07/15/2016   Lab Results  Component Value Date   TSH 3.553 07/15/2016   TSH 1.80 01/23/2013    Therapeutic Level Labs: No results found for: LITHIUM No results found for: VALPROATE No components found for:  CBMZ  Current Medications: Current Outpatient Medications  Medication Sig Dispense Refill  . amLODipine (NORVASC) 10 MG tablet Take 1 tablet (10 mg total) by mouth daily. 90 tablet 3  .  amoxicillin-clavulanate (AUGMENTIN) 875-125 MG tablet Take 1 tablet by mouth 2 (two) times daily. 14 tablet 0  . Aspirin-Caffeine (BC FAST PAIN RELIEF PO) Take by mouth as needed.    [name] azithromycin (ZITHROMAX) 250 MG tablet azithromycin 250 mg tablet  TAKE 2 TABLETS BY MOUTH ON DAY ONE. THEN TAKE 1 TABLET BY MOUTH ONCE DAILY FOR 4 DAYS.    [name] benzonatate (TESSALON) 200 MG capsule benzonatate 200 mg capsule  TAKE 1 CAPSULE BY MOUTH THREE TIMES DAILY AS NEEDED FOR COUGH FOR 10 DAYS    . chlorpheniramine-HYDROcodone (TUSSIONEX) 10-8 MG/5ML SUER hydrocodone 10 mg-chlorpheniramine 8 mg/5 mL oral susp extend.rel 12hr  TAKE 5 ML BY MOUTH EVERY 12 HOURS AS NEEDED FOR COUGH    .  dextroamphetamine (DEXTROSTAT) 10 MG tablet Take 2 tablets in the morning and 1 tablet in the afternoon. 90 tablet 0  . diphenhydrAMINE (BENADRYL) 25 MG tablet Take 3 tablets (75 mg total) by mouth at bedtime as needed. 90 tablet 1  . eszopiclone (LUNESTA) 2 MG TABS tablet Take 1 tablet (2 mg total) by mouth at bedtime as needed for sleep. Take immediately before bedtime 30 tablet 2  . lisinopril (ZESTRIL) 20 MG tablet Take 1 tablet (20 mg total) by mouth daily. 90 tablet 3  . Melatonin 10 MG CAPS Take 10 mg by mouth at bedtime. 30 capsule 1  . meloxicam (MOBIC) 15 MG tablet Take 1 tablet (15 mg total) by mouth daily. 30 tablet 2  . methocarbamol (ROBAXIN) 500 MG tablet methocarbamol 500 mg tablet  TAKE 1 TABLET BY MOUTH EVERY 8 HOURS AS NEEDED FOR UP TO 5 DAYS    . predniSONE (DELTASONE) 20 MG tablet prednisone 20 mg tablet  TAKE 2 TABLETS BY MOUTH ONCE DAILY FOR 5 DAYS    . pregabalin (LYRICA) 25 MG capsule Take 1 capsule (25 mg total) by mouth at bedtime. 30 capsule 0  . tadalafil (CIALIS) 20 MG tablet TAKE ONE TABLET BY MOUTH DAILY AS NEEDED FOR ERECTILE DYSFUNCTION 30 tablet 12   No current facility-administered medications for this visit.     Musculoskeletal: Strength & Muscle Tone: UTA Gait & Station: UTA Patient  leans: N/A  Psychiatric Specialty Exam: Review of Systems  Musculoskeletal: Positive for back pain and neck pain.  Psychiatric/Behavioral: The patient is nervous/anxious.   All other systems reviewed and are negative.   There were no vitals taken for this visit.There is no height or weight on file to calculate BMI.  General Appearance: Casual  Eye Contact:  Fair  Speech:  Clear and Coherent  Volume:  Normal  Mood:  Anxious Coping well  Affect:  Appropriate  Thought Process:  Goal Directed and Descriptions of Associations: Intact  Orientation:  Full (Time, Place, and Person)  Thought Content: Logical   Suicidal Thoughts:  No  Homicidal Thoughts:  No  Memory:  Immediate;   Fair Recent;   Fair Remote;   Fair  Judgement:  Fair  Insight:  Fair  Psychomotor Activity:  Normal  Concentration:  Concentration: Fair and Attention Span: Fair  Recall:  [employer] of Knowledge: Fair  Language: Fair  Akathisia:  No  Handed:  Right  AIMS (if indicated): UTA  Assets:  Communication Skills Desire for Improvement Housing Social Support  ADL's:  Intact  Cognition: WNL  Sleep:  Fair   Screenings: AUDIT   Flowsheet Row [facility] Visit [facility] 06/25/[facility] in [facility]  Alcohol Use Disorder Identification Test Final Score (AUDIT) 0    PHQ2-9   Flowsheet Row Video Visit from 11/14/2020 in [facility] Video Visit from 07/31/2020 in [facility]  PHQ-2 Total Score 1 5  PHQ-9 Total Score -- 11    Flowsheet Row Video Visit from 07/31/2020 in [facility]  C-SSRS RISK CATEGORY No Risk       Assessment and Plan: Mousa Prout is a 50 year old Caucasian male who has a history of ADHD, mood disorder, sleep issues, OSA was evaluated by telemedicine today.  Patient is currently struggling with chronic pain however continues to be stable on his medication.  Plan as noted  below.  Plan ADHD-stable Dextrostat 30 mg p.o. daily in divided dosage I have reviewed  controlled substance  database   Insomnia-stable Lunesta 2 mg p.o. nightly OSA noncompliant with CPAP  Episodic mood disorder-stable Patient advised to restart CBT, patient is not interested.  He declined. He will continue to benefit from sufficient pain management.  Tobacco use disorder-unstable Provided counseling  High risk medication use-will order a UDS. Will also order TSH labs. Faxed to [facility] lab.  Patient advised to get it done within the next 1 week.  Follow-up in clinic in person in 3 months from now.  This note was generated in part or whole with voice recognition software. Voice recognition is usually quite accurate but there are transcription errors that can and very often do occur. I apologize for any typographical errors that were not detected and corrected.        Jomarie Longs, MD 11/15/2020, 12:14 PM

## 2020-12-13 ENCOUNTER — Telehealth: Payer: Self-pay

## 2020-12-13 DIAGNOSIS — F900 Attention-deficit hyperactivity disorder, predominantly inattentive type: Secondary | ICD-10-CM

## 2020-12-13 MED ORDER — DEXTROAMPHETAMINE SULFATE 10 MG PO TABS: ORAL_TABLET | ORAL | 0 refills | Status: DC

## 2020-12-13 NOTE — Telephone Encounter (Signed)
Rx sent 

## 2020-12-13 NOTE — Telephone Encounter (Signed)
Medication refill request - Fax from patient's [facility] received for a new Dextrostat order, last provided and filled 11/14/20.  Patient does not return until 02/21/21.

## 2021-01-07 ENCOUNTER — Telehealth: Payer: Medicaid Other | Admitting: Physician Assistant

## 2021-01-07 ENCOUNTER — Other Ambulatory Visit: Payer: Self-pay

## 2021-01-07 DIAGNOSIS — K047 Periapical abscess without sinus: Secondary | ICD-10-CM

## 2021-01-07 MED ORDER — NAPROXEN 500 MG PO TABS: 500.0000 mg | ORAL_TABLET | Freq: Two times a day (BID) | ORAL | 0 refills | Status: DC

## 2021-01-07 MED ORDER — AMOXICILLIN 500 MG PO CAPS
500.0000 mg | ORAL_CAPSULE | Freq: Three times a day (TID) | ORAL | 0 refills | Status: AC
Start: 2021-01-07 — End: 2021-01-17

## 2021-01-07 NOTE — Progress Notes (Signed)

## 2021-01-13 ENCOUNTER — Other Ambulatory Visit: Payer: Self-pay | Admitting: Cardiovascular Disease

## 2021-01-13 NOTE — Telephone Encounter (Signed)
Please advise if OK to refill. Thank you! 

## 2021-01-27 ENCOUNTER — Other Ambulatory Visit: Payer: Self-pay | Admitting: Psychiatry

## 2021-01-27 ENCOUNTER — Telehealth: Payer: Self-pay

## 2021-01-27 DIAGNOSIS — F900 Attention-deficit hyperactivity disorder, predominantly inattentive type: Secondary | ICD-10-CM

## 2021-01-27 MED ORDER — DEXTROAMPHETAMINE SULFATE 10 MG PO TABS
ORAL_TABLET | ORAL | 0 refills | Status: DC
Start: 2021-01-27 — End: 2021-03-14

## 2021-01-27 NOTE — Telephone Encounter (Signed)
received fax requesting a refil on the dextroamphet 10mg 

## 2021-01-27 NOTE — Telephone Encounter (Signed)
Ordered for a month. Will defer the rest of refill to Dr. Elna Breslow.  I have utilized the  Controlled Substances Reporting System (PMP AWARxE) to confirm adherence regarding the patient's medication. My review reveals appropriate prescription fills.

## 2021-02-03 ENCOUNTER — Telehealth: Payer: Self-pay | Admitting: Psychiatry

## 2021-02-03 NOTE — Telephone Encounter (Signed)
Dextrostat pending at pharmacy for 90 tablets .
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
## 2021-02-21 ENCOUNTER — Other Ambulatory Visit: Payer: Self-pay

## 2021-02-21 ENCOUNTER — Encounter: Payer: Self-pay | Admitting: Psychiatry

## 2021-02-21 ENCOUNTER — Telehealth: Payer: Self-pay

## 2021-02-21 ENCOUNTER — Telehealth (INDEPENDENT_AMBULATORY_CARE_PROVIDER_SITE_OTHER): Payer: Self-pay | Admitting: Psychiatry

## 2021-02-21 DIAGNOSIS — Z91199 Patient's noncompliance with other medical treatment and regimen due to unspecified reason: Secondary | ICD-10-CM

## 2021-02-21 DIAGNOSIS — F900 Attention-deficit hyperactivity disorder, predominantly inattentive type: Secondary | ICD-10-CM

## 2021-02-21 DIAGNOSIS — F5101 Primary insomnia: Secondary | ICD-10-CM

## 2021-02-21 DIAGNOSIS — Z9111 Patient's noncompliance with dietary regimen: Secondary | ICD-10-CM

## 2021-02-21 DIAGNOSIS — F39 Unspecified mood [affective] disorder: Secondary | ICD-10-CM

## 2021-02-21 DIAGNOSIS — F17200 Nicotine dependence, unspecified, uncomplicated: Secondary | ICD-10-CM

## 2021-02-21 MED ORDER — ESZOPICLONE 2 MG PO TABS: 2.0000 mg | ORAL_TABLET | Freq: Every evening | ORAL | 2 refills | Status: DC | PRN

## 2021-02-21 NOTE — Telephone Encounter (Signed)
pt was called told that he needed to have his labs done before appt. pt states hospital told him that they didn't have a order.  order is in the system but i will fax over so to make sure that they have it. pt was told that he have his appt today but that until we get the lab results back he will not get a medication refill.

## 2021-02-21 NOTE — Progress Notes (Signed)
Virtual Visit via Video Note  I connected with Jake Elliott on 02/21/21 at 11:30 AM EDT by a video enabled telemedicine application and verified that I am speaking with the correct person using two identifiers.  Location Provider Location : [facility] Patient Location : Home  Participants: Patient , Provider    I discussed the limitations of evaluation and management by telemedicine and the availability of in person appointments. The patient expressed understanding and agreed to proceed.    I discussed the assessment and treatment plan with the patient. The patient was provided an opportunity to ask questions and all were answered. The patient agreed with the plan and demonstrated an understanding of the instructions.   The patient was advised to call back or seek an in-person evaluation if the symptoms worsen or if the condition fails to improve as anticipated.   BH MD OP Progress Note  02/21/2021 12:55 PM Uzziel Russey  MRN:  [id number]  Chief Complaint:  Chief Complaint   Follow-up; Depression; ADD    HPI: Jake Elliott is a 50 year old Caucasian male, employed, lives in [place], has a history of ADHD, primary insomnia, obstructive sleep apnea, episodic mood disorder, hypertrophic obstructive cardiomyopathy-history of myomectomy, essential hypertension, chronic pain syndrome, headaches, tachycardia was evaluated by telemedicine today.  Patient reports he continues to struggle with pain.  He reports he however has been managing his pain with BC powder, ibuprofen and has been able to function okay.  He is able to take care of himself.  Patient does struggle with his mood, has ups and downs in his mood although reports it does not affect his functioning.  He reports he has tried several antidepressants in the past, been in therapy before and is not interested in medications.  Patient reports sleep is good.  Patient is compliant on Lunesta.  Patient has been noncompliant with urine  drug screen, reports he called the lab and was told that they did not have an order.   Patient denies any suicidality, homicidality or perceptual disturbances.  Patient continues to enjoy spending time with animals, recently got a pet squirrel.  Patient is not interested in quitting smoking.  Visit Diagnosis:    ICD-10-CM   1. Attention deficit hyperactivity disorder (ADHD), predominantly inattentive type  F90.0     2. Primary insomnia  F51.01 eszopiclone (LUNESTA) 2 MG TABS tablet    3. Episodic mood disorder (HCC)  F39     4. Tobacco use disorder  F17.200     5. Noncompliance with treatment plan  Z91.11       Past Psychiatric History: Reviewed past psychiatric history from progress note on 06/02/2017  Past Medical History:  Past Medical History:  Diagnosis Date   Angina at rest [facility])    Anxiety    Attention-deficit/hyperactivity disorder    Bipolar disorder (HCC)    Depression    History of tobacco abuse    Hypertension    Hypertrophic cardiomyopathy (HCC)    s/p myomectomy   Peripheral neuropathy    PTSD (post-traumatic stress disorder)     Past Surgical History:  Procedure Laterality Date   CARDIAC CATHETERIZATION     11/14/2010   CORONARY ARTERY BYPASS GRAFT     for myomectomy for hypertrophic cardiomyopathy    Family Psychiatric History: Reviewed family psychiatric history from progress note on 06/02/2017  Family History:  Family History  Problem Relation Age of Onset   Cancer - Lung Mother    Skin cancer Mother    Alcohol  abuse Mother    Drug abuse Mother    Stroke Father    Hypertension Father    Alcohol abuse Father    Drug abuse Father    Drug abuse Sister     Social History: Reviewed social history from progress note on 06/02/2017 Social History   Socioeconomic History   Marital status: Single    Spouse name: Not on file   Number of children: Not on file   Years of education: Not on file   Highest education level: Not on file   Occupational History   Not on file  Tobacco Use   Smoking status: Every Day    Packs/day: 1.00    Years: 15.00    Pack years: 15.00    Types: Cigarettes    Start date: 12/31/1988   Smokeless tobacco: Never  Vaping Use   Vaping Use: Never used  Substance and Sexual Activity   Alcohol use: No    Alcohol/week: 0.0 standard drinks   Drug use: No   Sexual activity: Yes    Partners: Female    Birth control/protection: None  Other Topics Concern   Not on file  Social History Narrative   Not on file   Social Determinants of Health   Financial Resource Strain: Not on file  Food Insecurity: Not on file  Transportation Needs: Not on file  Physical Activity: Not on file  Stress: Not on file  Social Connections: Not on file    Allergies:  Allergies  Allergen Reactions   Acetaminophen Other (See Comments)    headache   Bystolic [Nebivolol Hcl]     Headaches   Codeine     Nausea & headache Other reaction(s): HEADACHE   Morphine And Related     Nausea & headache.    Metabolic Disorder Labs: No results found for: HGBA1C, MPG Lab Results  Component Value Date   PROLACTIN 6.1 07/15/2016   Lab Results  Component Value Date   CHOL 198 07/05/2019   TRIG 130 07/05/2019   HDL 47 07/05/2019   CHOLHDL 4.2 07/05/2019   VLDL 33 07/15/2016   LDLCALC 128 (H) 07/05/2019   LDLCALC 175 (H) 07/15/2016   Lab Results  Component Value Date   TSH 3.553 07/15/2016   TSH 1.80 01/23/2013    Therapeutic Level Labs: No results found for: LITHIUM No results found for: VALPROATE No components found for:  CBMZ  Current Medications: Current Outpatient Medications  Medication Sig Dispense Refill   amLODipine (NORVASC) 10 MG tablet Take 1 tablet (10 mg total) by mouth daily. 90 tablet 3   Aspirin-Caffeine (BC FAST PAIN RELIEF PO) Take by mouth as needed.     dextroamphetamine (DEXTROSTAT) 10 MG tablet Take 2 tablets in the morning and 1 tablet in the afternoon. 90 tablet 0    diphenhydrAMINE (BENADRYL) 25 MG tablet Take 3 tablets (75 mg total) by mouth at bedtime as needed. 90 tablet 1   eszopiclone (LUNESTA) 2 MG TABS tablet Take 1 tablet (2 mg total) by mouth at bedtime as needed for sleep. Take immediately before bedtime 30 tablet 2   lisinopril (ZESTRIL) 20 MG tablet Take 1 tablet (20 mg total) by mouth daily. 90 tablet 3   Melatonin 10 MG CAPS Take 10 mg by mouth at bedtime. 30 capsule 1   methocarbamol (ROBAXIN) 500 MG tablet methocarbamol 500 mg tablet  TAKE 1 TABLET BY MOUTH EVERY 8 HOURS AS NEEDED FOR UP TO 5 DAYS     naproxen (NAPROSYN)  500 MG tablet Take 1 tablet (500 mg total) by mouth 2 (two) times daily with a meal. 30 tablet 0   tadalafil (CIALIS) 20 MG tablet TAKE 1 TABLET BY MOUTH ONCE DAILY AS NEEDED FOR ERECTILE DYSFUNCTION 30 tablet 3   No current facility-administered medications for this visit.     Musculoskeletal: Strength & Muscle Tone:  UTA Gait & Station: normal Patient leans: N/A  Psychiatric Specialty Exam: Review of Systems  Musculoskeletal:  Positive for back pain (chronic).  Psychiatric/Behavioral:         Mood swings  All other systems reviewed and are negative.  There were no vitals taken for this visit.There is no height or weight on file to calculate BMI.  General Appearance: Casual  Eye Contact:  Fair  Speech:  Clear and Coherent  Volume:  Normal  Mood:   Mood swings on and off  Affect:  Appropriate  Thought Process:  Goal Directed and Descriptions of Associations: Intact  Orientation:  Full (Time, Place, and Person)  Thought Content: Logical   Suicidal Thoughts:  No  Homicidal Thoughts:  No  Memory:  Immediate;   Fair Recent;   Fair Remote;   Fair  Judgement:  Fair  Insight:  Fair  Psychomotor Activity:  Normal  Concentration:  Concentration: Fair and Attention Span: Fair  Recall:  [employer]Fair  Fund of Knowledge: Fair  Language: Fair  Akathisia:  No  Handed:  Right  AIMS (if indicated): not done  Assets:   Communication Skills Desire for Improvement Housing Intimacy Social Support Transportation  ADL's:  Intact  Cognition: WNL  Sleep:  Fair   Screenings: AUDIT    Flowsheet Row [facility]/25/[facility]  Alcohol Use Disorder Identification Test Final Score (AUDIT) 0      PHQ2-9    Flowsheet Row Video Visit from 11/14/2020 in [facility] Video Visit from 07/31/2020 in [facility]  PHQ-2 Total Score 1 5  PHQ-9 Total Score -- 11      Flowsheet Row Video Visit from 07/31/2020 in [facility]  C-SSRS RISK CATEGORY No Risk        Assessment and Plan: Jake GeraldsMark Fahey is a 50 year old Caucasian male who has a history of ADHD, mood disorder, sleep issue, OSA was evaluated by telemedicine today.  Patient is currently struggling with chronic pain, which does have an impact on his mood.  Patient is not interested in antidepressants and reports mood symptoms as manageable.  Patient currently stable on Dextrostat with his ADHD symptoms and reports sleep as stable.  Plan as noted below.  Plan ADHD-stable Dextrostat 30 mg p.o. daily divided dosage I have reviewed [place] controlled substance database.  Insomnia-stable Lunesta 2 mg p.o. nightly OSA noncompliant with CPAP  Episodic mood disorder-stable Patient was advised to restart CBT, he declines again. He is not interested in antidepressants. He will benefit from sufficient pain management.   Tobacco use disorder-unstable Provided counseling for 2 minutes  Noncompliance with treatment plan-patient provided education about getting urine drug screen done.  A new lab slip has [facility].  He will also need to come into the office next visit for blood pressure and heart rate evaluation.  Patient advised to contact us cardiologist since he reports he has been noncompliant with his antihypertensive  medications.  Provided education. Once I review his urine drug screen, prescriptions will be provided as needed.  Follow-up in clinic in  person in 4 weeks.  This note was generated in part or whole with voice recognition software. Voice recognition is usually quite accurate but there are transcription errors that can and very often do occur. I apologize for any typographical errors that were not detected and corrected.      Jomarie Longs, MD 02/21/2021, 12:55 PM

## 2021-03-13 ENCOUNTER — Other Ambulatory Visit
Admission: RE | Admit: 2021-03-13 | Discharge: 2021-03-13 | Disposition: A | Discharge status: Home / Self Care | Payer: Medicaid Other | Source: Ambulatory Visit | Attending: Psychiatry | Admitting: Psychiatry

## 2021-03-13 DIAGNOSIS — Z79899 Other long term (current) drug therapy: Secondary | ICD-10-CM | POA: Insufficient documentation

## 2021-03-13 DIAGNOSIS — F5101 Primary insomnia: Secondary | ICD-10-CM | POA: Insufficient documentation

## 2021-03-13 LAB — URINE DRUG SCREEN, QUALITATIVE (ARMC ONLY)
Amphetamines, Ur Screen: POSITIVE — AB
Barbiturates, Ur Screen: NOT DETECTED
Benzodiazepine, Ur Scrn: NOT DETECTED
Cannabinoid 50 Ng, Ur ~~LOC~~: NOT DETECTED
Cocaine Metabolite,Ur ~~LOC~~: NOT DETECTED
MDMA (Ecstasy)Ur Screen: NOT DETECTED
Methadone Scn, Ur: NOT DETECTED
Opiate, Ur Screen: POSITIVE — AB
Phencyclidine (PCP) Ur S: NOT DETECTED
Tricyclic, Ur Screen: NOT DETECTED

## 2021-03-13 LAB — TSH: TSH: 3.663 u[IU]/mL (ref 0.350–4.500)

## 2021-03-14 ENCOUNTER — Encounter: Payer: Self-pay | Admitting: Psychiatry

## 2021-03-14 ENCOUNTER — Other Ambulatory Visit: Payer: Self-pay

## 2021-03-14 ENCOUNTER — Other Ambulatory Visit
Admission: RE | Admit: 2021-03-14 | Discharge: 2021-03-14 | Disposition: A | Discharge status: Home / Self Care | Payer: Medicaid Other | Source: Ambulatory Visit | Attending: Psychiatry | Admitting: Psychiatry

## 2021-03-14 ENCOUNTER — Telehealth: Payer: Self-pay | Admitting: Psychiatry

## 2021-03-14 ENCOUNTER — Ambulatory Visit (INDEPENDENT_AMBULATORY_CARE_PROVIDER_SITE_OTHER): Payer: Self-pay | Admitting: Psychiatry

## 2021-03-14 VITALS — BP 187/102 | HR 78 | Temp 98.3°F | Wt 209.4 lb

## 2021-03-14 DIAGNOSIS — Z79899 Other long term (current) drug therapy: Secondary | ICD-10-CM

## 2021-03-14 DIAGNOSIS — F5101 Primary insomnia: Secondary | ICD-10-CM

## 2021-03-14 DIAGNOSIS — Z5181 Encounter for therapeutic drug level monitoring: Secondary | ICD-10-CM | POA: Insufficient documentation

## 2021-03-14 DIAGNOSIS — F39 Unspecified mood [affective] disorder: Secondary | ICD-10-CM

## 2021-03-14 DIAGNOSIS — R030 Elevated blood-pressure reading, without diagnosis of hypertension: Secondary | ICD-10-CM | POA: Insufficient documentation

## 2021-03-14 DIAGNOSIS — F900 Attention-deficit hyperactivity disorder, predominantly inattentive type: Secondary | ICD-10-CM

## 2021-03-14 DIAGNOSIS — F17200 Nicotine dependence, unspecified, uncomplicated: Secondary | ICD-10-CM

## 2021-03-14 NOTE — Addendum Note (Signed)
Addended byJomarie Longs on: 03/14/2021 02:11 PM   Modules accepted: Orders

## 2021-03-14 NOTE — Progress Notes (Addendum)
BH MD OP Progress Note  03/14/2021 2:10 PM Jake Elliott  MRN:  [id number]  Chief Complaint:  Chief Complaint   Follow-up; Anxiety    HPI: Jake Elliott is a 50 year old Caucasian male, employed, lives in [place], has a history of ADHD, primary insomnia, obstructive sleep apnea, episodic mood disorder, hypertrophic obstructive cardiomyopathy-history of myomectomy, essential hypertension, chronic pain syndrome, headaches, tachycardia was evaluated in office today.  Patient today reports he is grieving the loss of his cat who died couple of weeks ago.  Patient continues to foster animals , reports he does not believe that helps him any with his mood however he believes it is something that he needs to do and hence he does it anyway.  Patient reports the past few days he has been sad because of the loss of his cat.  He however has been able to do activities that he normally does.  He has not been able to go to the gym since due to the pandemic and all the changes with his lifestyle that he had to make because of that he has not been able to.  That does have an impact on his energy level and he struggles with it at times.  Patient reports sleep is overall okay.  He takes a combination of Benadryl and melatonin and that helps.  Patient denies any suicidality.  Did not appear to have any perceptual disturbances.    Reviewed and discussed urine drug screen resulted 03/13/2021-positive for amphetamines and positive for opioids.  I have reviewed [place] PMP aware-patient has not been prescribed opioids.  Discussed this with patient.  Discussed that a confirmatory test will be done.    Visit Diagnosis: R/O Opioid use disorder   ICD-10-CM   1. Attention deficit hyperactivity disorder (ADHD), predominantly inattentive type  F90.0     2. Primary insomnia  F51.01     3. Episodic mood disorder (HCC)  F39     4. Tobacco use disorder  F17.200     5. High risk medication use  Z79.899 Opiate Confirmation,  GC/MS,Urine    6. Elevated blood pressure reading  R03.0       Past Psychiatric History: I have reviewed past psychiatric history from progress note on 06/02/2017  Past Medical History:  Past Medical History:  Diagnosis Date   Angina at rest [facility])    Anxiety    Attention-deficit/hyperactivity disorder    Bipolar disorder (HCC)    Depression    History of tobacco abuse    Hypertension    Hypertrophic cardiomyopathy (HCC)    s/p myomectomy   Peripheral neuropathy    PTSD (post-traumatic stress disorder)     Past Surgical History:  Procedure Laterality Date   CARDIAC CATHETERIZATION     11/14/2010   CORONARY ARTERY BYPASS GRAFT     for myomectomy for hypertrophic cardiomyopathy    Family Psychiatric History: Reviewed family psychiatric history from progress note on 06/02/2017  Family History:  Family History  Problem Relation Age of Onset   Cancer - Lung Mother    Skin cancer Mother    Alcohol abuse Mother    Drug abuse Mother    Stroke Father    Hypertension Father    Alcohol abuse Father    Drug abuse Father    Drug abuse Sister     Social History: Reviewed social history from progress note on 06/02/2017 Social History   Socioeconomic History   Marital status: Single    Spouse name: Not on  file   Number of children: Not on file   Years of education: Not on file   Highest education level: Not on file  Occupational History   Not on file  Tobacco Use   Smoking status: Every Day    Packs/day: 1.00    Years: 15.00    Pack years: 15.00    Types: Cigarettes    Start date: 12/31/1988   Smokeless tobacco: Never  Vaping Use   Vaping Use: Never used  Substance and Sexual Activity   Alcohol use: No    Alcohol/week: 0.0 standard drinks   Drug use: No   Sexual activity: Yes    Partners: Female    Birth control/protection: None  Other Topics Concern   Not on file  Social History Narrative   Not on file   Social Determinants of Health   Financial  Resource Strain: Not on file  Food Insecurity: Not on file  Transportation Needs: Not on file  Physical Activity: Not on file  Stress: Not on file  Social Connections: Not on file    Allergies:  Allergies  Allergen Reactions   Acetaminophen Other (See Comments)    headache   Bystolic [Nebivolol Hcl]     Headaches   Codeine     Nausea & headache Other reaction(s): HEADACHE   Morphine And Related     Nausea & headache.    Metabolic Disorder Labs: No results found for: HGBA1C, MPG Lab Results  Component Value Date   PROLACTIN 6.1 07/15/2016   Lab Results  Component Value Date   CHOL 198 07/05/2019   TRIG 130 07/05/2019   HDL 47 07/05/2019   CHOLHDL 4.2 07/05/2019   VLDL 33 07/15/2016   LDLCALC 128 (H) 07/05/2019   LDLCALC 175 (H) 07/15/2016   Lab Results  Component Value Date   TSH 3.663 03/13/2021   TSH 3.553 07/15/2016    Therapeutic Level Labs: No results found for: LITHIUM No results found for: VALPROATE No components found for:  CBMZ  Current Medications: Current Outpatient Medications  Medication Sig Dispense Refill   amLODipine (NORVASC) 10 MG tablet Take 1 tablet (10 mg total) by mouth daily. 90 tablet 3   Aspirin-Caffeine (BC FAST PAIN RELIEF PO) Take by mouth as needed.     diphenhydrAMINE (BENADRYL) 25 MG tablet Take 3 tablets (75 mg total) by mouth at bedtime as needed. 90 tablet 1   lisinopril (ZESTRIL) 20 MG tablet Take 1 tablet (20 mg total) by mouth daily. 90 tablet 3   Melatonin 10 MG CAPS Take 10 mg by mouth at bedtime. 30 capsule 1   methocarbamol (ROBAXIN) 500 MG tablet methocarbamol 500 mg tablet  TAKE 1 TABLET BY MOUTH EVERY 8 HOURS AS NEEDED FOR UP TO 5 DAYS     naproxen (NAPROSYN) 500 MG tablet Take 1 tablet (500 mg total) by mouth 2 (two) times daily with a meal. 30 tablet 0   tadalafil (CIALIS) 20 MG tablet TAKE 1 TABLET BY MOUTH ONCE DAILY AS NEEDED FOR ERECTILE DYSFUNCTION 30 tablet 3   No current facility-administered medications  for this visit.     Musculoskeletal: Strength & Muscle Tone: within normal limits Gait & Station: normal Patient leans: N/A  Psychiatric Specialty Exam: Review of Systems  Musculoskeletal:  Positive for back pain.  Psychiatric/Behavioral:         Grieving  All other systems reviewed and are negative.  Blood pressure (!) 187/102, pulse 78, temperature 98.3 F (36.8 C), temperature source Oral,  weight 209 lb 6.4 oz (95 kg).Body mass index is 29.21 kg/m.  General Appearance: Casual  Eye Contact:  Fair  Speech:  Normal Rate  Volume:  Normal  Mood:   grief  Affect:  Congruent  Thought Process:  Goal Directed and Descriptions of Associations: Intact  Orientation:  Full (Time, Place, and Person)  Thought Content: Logical   Suicidal Thoughts:  No  Homicidal Thoughts:  No  Memory:  Immediate;   Fair Recent;   Fair Remote;   Fair  Judgement:  Fair  Insight:  Fair  Psychomotor Activity:  Normal  Concentration:  Concentration: Fair and Attention Span: Fair  Recall:  [employer] of Knowledge: Fair  Language: Fair  Akathisia:  No  Handed:  Right  AIMS (if indicated): done-0  Assets:  Communication Skills Desire for Improvement Housing Social Support  ADL's:  Intact  Cognition: WNL  Sleep:  Fair   Screenings: AUDIT    Flowsheet Row [facility] 06/25/2015 in [facility]  Alcohol Use Disorder Identification Test Final Score (AUDIT) 0      PHQ2-9    Flowsheet Row Office Visit from 03/14/2021 in [facility] Video Visit from 11/14/2020 in [facility] Video Visit from 07/31/2020 in [facility]  PHQ-2 Total Score 2 1 5   PHQ-9 Total Score 3 -- 11      Flowsheet Row Video Visit from 07/31/2020 in [facility]  C-SSRS RISK CATEGORY No Risk        Assessment and Plan: Aviraj Kentner is a 50 year old Caucasian male, has a history of  ADHD, mood disorder, sleep issue, OSA was evaluated in office today.  Patient continues to struggle with chronic pain and also is grieving the loss of his pet.  Patient had urine drug screen done yesterday which came back positive for opioids which is not prescribed.  Discussed plan as noted below.  Plan ADHD-stable Discussed with patient he will not be provided stimulant medications, since his urine drug screen was positive for opioids which are not prescribed.  Patient reports he has been using poppy seeds in his smoothie which could have caused it.  Discussed with patient that we can run a confirmatory test. I contacted lab at [facility], spoke to lab staff and gave an order to do a confirmatory test to detect opioids in his urine which was collected yesterday. I have also put an order in the system. Discontinue Dextrostat for now.  Insomnia-stable He takes a combination of melatonin and Benadryl. Discontinue Lunesta OSA noncompliant with CPAP  Episodic mood disorder-stable Patient was advised to start CBT in the past however he declined. Patient will continue to need sufficient pain management. Patient is not interested in antidepressants.  High risk medication use-UDS-reviewed-dated 9/29 2022-positive for stimulants, positive for opioids.  This was reviewed and discussed with patient.  Patient denies abusing any opioids.  He has not been prescribed opioids-per [place] PMP aware.  I have ordered a confirmatory test for opioids, I have contacted the lab and spoke to lab staff to add this test .  Elevated blood pressure reading-patient's blood pressure high today.  Patient provided education.  Will not prescribe Dextrostat/stimulants for this patient.  Patient advised to go to the nearest urgent care for management of his blood pressure.  Also we will coordinate care with cardiology.  Follow-up in clinic as needed.  Discussed with patient if he is interested in starting a nonstimulant medication for  his ADHD he could contact the clinic, discussed alternative treatment options.  This note was generated in part or whole with voice recognition software. Voice recognition is usually quite accurate but there are transcription errors that can and very often do occur. I apologize for any typographical errors that were not detected and corrected.      Jomarie Longs, MD 03/14/2021, 2:10 PM

## 2021-03-14 NOTE — Telephone Encounter (Signed)
Contacted [facility] lab-spoke to staff-Mr.Jake Elliott-who will add Opioid confirmation test to the patient specimen which was collected yesterday-urine.  Per staff [name] the results will be reported in 5 to 7 days.

## 2021-03-17 ENCOUNTER — Ambulatory Visit: Payer: Self-pay | Admitting: Psychiatry

## 2021-03-18 LAB — MISC LABCORP TEST (SEND OUT): Labcorp test code: 737846

## 2021-03-21 ENCOUNTER — Telehealth: Payer: Self-pay | Admitting: Psychiatry

## 2021-03-21 NOTE — Telephone Encounter (Signed)
Opioid confirmatory test came back positive.  We will not be prescribing this patient controlled substances.

## 2021-03-24 ENCOUNTER — Telehealth: Payer: Self-pay

## 2021-03-24 NOTE — Telephone Encounter (Signed)
Please let patient know the lab confirmation was ordered after speaking to pharmacy . Please provide with resources for substance use treatment.

## 2021-03-24 NOTE — Telephone Encounter (Signed)
spoke with patient and explained that his labwork showed up positve and that the number for the test for to see if you were allergic to poppy seeds. pt was advised that dr. Elna Breslow would not be able to see hime due to the + drug  test and wanted to refer him to a IOP program and or [facility] or [facility] and he refused. He insist on speaking with Dr. Elna Breslow.

## 2021-03-24 NOTE — Telephone Encounter (Signed)
pt would like to speak with you .  he states that he went to the lab but they told him that they could use what they had the day before. she also said that he gave lea the number to give to you that the pharmacy gave to him to order the test.   He said he like to speak with you about everything.

## 2021-03-24 NOTE — Telephone Encounter (Signed)
Spoke to patient, who seemed upset and did not want to accept that his drug test came back positive for opiates .  Patient upset that his stimulants were discontinued.  Patient stimulants were discontinued not only because of urine drug screen but also because his blood pressure was way about the limit.  This was discussed with patient at his last visit.  Patient seemed agitated, using profanity over the phone when writer attempted to discuss with patient that his opioid confirmation came back positive.  Patient reports that he is not interested in any kind of counseling/therapy sessions.  He is not interested in IOP.

## 2021-03-26 ENCOUNTER — Telehealth: Payer: Self-pay | Admitting: Psychiatry

## 2021-03-26 NOTE — Telephone Encounter (Signed)
Contacted clinical support at [facility] [phone]- spoke to the provider on call -Dr. Novella Rob patient's current urine drug screen and opioid confirmatory test-as per the provider on call-the result does not support patient's claim that his UDT -came back positive for opioids due to poppy seeds in his diet.  According to the provider on call patient likely using something else other than poppy seeds to have a urine drug screen result like he had recently.

## 2021-04-20 ENCOUNTER — Telehealth: Payer: Medicaid Other | Admitting: Nurse Practitioner

## 2021-04-20 DIAGNOSIS — K0889 Other specified disorders of teeth and supporting structures: Secondary | ICD-10-CM

## 2021-04-20 MED ORDER — NAPROXEN 500 MG PO TABS: 500.0000 mg | ORAL_TABLET | Freq: Two times a day (BID) | ORAL | 0 refills | Status: DC

## 2021-04-20 MED ORDER — AMOXICILLIN 500 MG PO CAPS: 500.0000 mg | ORAL_CAPSULE | Freq: Three times a day (TID) | ORAL | 0 refills | Status: AC

## 2021-04-20 NOTE — Progress Notes (Signed)
E-Visit for Dental Pain  We are sorry that you are not feeling well.  Here is how we plan to help!  Based on what you have shared with me in the questionnaire, it sounds like you have dentalgia  naprosyn 500mg  2 times per day for 7 days for discomfort and Amoxicillin 500mg  3 times per day for 10 days  It is imperative that you see a dentist within 10 days of this eVisit to determine the cause of the dental pain and be sure it is adequately treated  A toothache or tooth pain is caused when the nerve in the root of a tooth or surrounding a tooth is irritated. Dental (tooth) infection, decay, injury, or loss of a tooth are the most common causes of dental pain. Pain may also occur after an extraction (tooth is pulled out). Pain sometimes originates from other areas and radiates to the jaw, thus appearing to be tooth pain.Bacteria growing inside your mouth can contribute to gum disease and dental decay, both of which can cause pain. A toothache occurs from inflammation of the central portion of the tooth called pulp. The pulp contains nerve endings that are very sensitive to pain. Inflammation to the pulp or pulpitis may be caused by dental cavities, trauma, and infection.    HOME CARE:   For toothaches: Over-the-counter pain medications such as acetaminophen or ibuprofen may be used. Take these as directed on the package while you arrange for a dental appointment. Avoid very cold or hot foods, because they may make the pain worse. You may get relief from biting on a cotton ball soaked in oil of cloves. You can get oil of cloves at most drug stores.  For jaw pain:  Aspirin may be helpful for problems in the joint of the jaw in adults. If pain happens every time you open your mouth widely, the temporomandibular joint (TMJ) may be the source of the pain. Yawning or taking a large bite of food may worsen the pain. An appointment with your doctor or dentist will help you find the cause.     GET  HELP RIGHT AWAY IF:  You have a high fever or chills If you have had a recent head or face injury and develop headache, light headedness, nausea, vomiting, or other symptoms that concern you after an injury to your face or mouth, you could have a more serious injury in addition to your dental injury. A facial rash associated with a toothache: This condition may improve with medication. Contact your doctor for them to decide what is appropriate. Any jaw pain occurring with chest pain: Although jaw pain is most commonly caused by dental disease, it is sometimes referred pain from other areas. People with heart disease, especially people who have had stents placed, people with diabetes, or those who have had heart surgery may have jaw pain as a symptom of heart attack or angina. If your jaw or tooth pain is associated with lightheadedness, sweating, or shortness of breath, you should see a doctor as soon as possible. Trouble swallowing or excessive pain or bleeding from gums: If you have a history of a weakened immune system, diabetes, or steroid use, you may be more susceptible to infections. Infections can often be more severe and extensive or caused by unusual organisms. Dental and gum infections in people with these conditions may require more aggressive treatment. An abscess may need draining or IV antibiotics, for example.  MAKE SURE YOU   Understand these instructions. Will  watch your condition. Will get help right away if you are not doing well or get worse.  Thank you for choosing an e-visit.  Your e-visit answers were reviewed by a board certified advanced clinical practitioner to complete your personal care plan. Depending upon the condition, your plan could have included both over the counter or prescription medications.  Please review your pharmacy choice. Make sure the pharmacy is open so you can pick up prescription now. If there is a problem, you may contact your provider through  [employer] and have the prescription routed to another pharmacy.  Your safety is important to [place]. If you have drug allergies check your prescription carefully.   For the next 24 hours you can use MyChart to ask questions about today's visit, request a non-urgent call back, or ask for a work or school excuse. You will get an email in the next two days asking about your experience. I hope that your e-visit has been valuable and will speed your recovery.   I have spent at least 5 minutes reviewing and documenting in the patient's chart.

## 2021-09-03 ENCOUNTER — Other Ambulatory Visit: Payer: Self-pay | Admitting: Cardiovascular Disease

## 2021-09-04 ENCOUNTER — Telehealth: Payer: Self-pay | Admitting: Cardiovascular Disease

## 2021-09-04 MED ORDER — TADALAFIL 20 MG PO TABS: ORAL_TABLET | ORAL | 0 refills | Status: DC

## 2021-09-04 MED ORDER — LISINOPRIL 20 MG PO TABS: 20.0000 mg | ORAL_TABLET | Freq: Every day | ORAL | 0 refills | Status: DC

## 2021-09-04 MED ORDER — AMLODIPINE BESYLATE 10 MG PO TABS: 10.0000 mg | ORAL_TABLET | Freq: Every day | ORAL | 0 refills | Status: DC

## 2021-09-04 NOTE — Addendum Note (Signed)
Addended by: Kendrick Fries on: 09/04/2021 03:44 PM ? ? Modules accepted: Orders ? ?

## 2021-09-04 NOTE — Telephone Encounter (Signed)
No VM  

## 2021-09-04 NOTE — Telephone Encounter (Signed)
Please contact pt for future appointment. Pt overdue for 12 month f/u. 

## 2021-09-04 NOTE — Telephone Encounter (Signed)
Requested Prescriptions  ? ?Signed Prescriptions Disp Refills  ? tadalafil (CIALIS) 20 MG tablet 30 tablet 0  ?  Sig: TAKE 1 TABLET BY MOUTH ONCE DAILY AS NEEDED FOR ERECTILE DYSFUNCTION  ?  Authorizing Provider: Antonieta Iba  ?  Ordering User: Iverson Alamin C  ? amLODipine (NORVASC) 10 MG tablet 30 tablet 0  ?  Sig: Take 1 tablet (10 mg total) by mouth daily.  ?  Authorizing Provider: Antonieta Iba  ?  Ordering User: Lacorey Brusca C  ? lisinopril (ZESTRIL) 20 MG tablet 30 tablet 0  ?  Sig: Take 1 tablet (20 mg total) by mouth daily.  ?  Authorizing Provider: Antonieta Iba  ?  Ordering User: Iverson Alamin C  ? ? ? ?

## 2021-09-04 NOTE — Telephone Encounter (Signed)
?*  STAT* If patient is at the pharmacy, call can be transferred to refill team. ? ? ?1. Which medications need to be refilled? (please list name of each medication and dose if known) Amlodipine 10 mg, 1 tablet daily, Lisinopril 20mg  20mg  1 tablet daily, Tadalafil 20mg  1 tablet daily  ? ?2. Which pharmacy/location (including street and city if local pharmacy) is medication to be sent to? [facility] [address] ? ?3. Do they need a 30 day or 90 day supply? 30 day ?
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
## 2021-09-26 ENCOUNTER — Ambulatory Visit: Payer: Self-pay | Admitting: Physician Assistant

## 2021-10-07 NOTE — Telephone Encounter (Signed)
Scheduled
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
## 2021-10-31 ENCOUNTER — Encounter: Payer: Self-pay | Admitting: Physician Assistant

## 2021-10-31 ENCOUNTER — Ambulatory Visit (INDEPENDENT_AMBULATORY_CARE_PROVIDER_SITE_OTHER): Payer: Self-pay | Admitting: Physician Assistant

## 2021-10-31 VITALS — BP 144/90 | HR 80 | Ht 71.0 in | Wt 227.0 lb

## 2021-10-31 DIAGNOSIS — S81809A Unspecified open wound, unspecified lower leg, initial encounter: Secondary | ICD-10-CM

## 2021-10-31 DIAGNOSIS — I739 Peripheral vascular disease, unspecified: Secondary | ICD-10-CM

## 2021-10-31 DIAGNOSIS — I10 Essential (primary) hypertension: Secondary | ICD-10-CM

## 2021-10-31 DIAGNOSIS — I421 Obstructive hypertrophic cardiomyopathy: Secondary | ICD-10-CM

## 2021-10-31 DIAGNOSIS — L659 Nonscarring hair loss, unspecified: Secondary | ICD-10-CM

## 2021-10-31 MED ORDER — NEBIVOLOL HCL 2.5 MG PO TABS
2.5000 mg | ORAL_TABLET | Freq: Every day | ORAL | 3 refills | Status: DC
Start: 2021-10-31 — End: 2022-01-02

## 2021-10-31 NOTE — Patient Instructions (Signed)
Medication Instructions:  - Your physician has recommended you make the following change in your medication:   1) START Bystolic 2.5 mg: - take 1 tablet by mouth once daily   *If you need a refill on your cardiac medications before your next appointment, please call your pharmacy*   Lab Work: - Your physician recommends that you return for lab work at your convenience:  [facility]/ [place] at [facility] 1st desk on the right to check in (REGISTRATION)  Lab hours: Monday- Friday (7:30 am- 5:30 pm)   If you have labs (blood work) drawn today and your tests are completely normal, you will receive your results only by: MyChart Message (if you have MyChart) OR A paper copy in the mail If you have any lab test that is abnormal or we need to change your treatment, we will call you to review the results.   Testing/Procedures:  1) Echocardiogram: - Your physician has requested that you have an echocardiogram. Echocardiography is a painless test that uses sound waves to create images of your heart. It provides your doctor with information about the size and shape of your heart and how well your heart's chambers and valves are working. This procedure takes approximately one hour. There are no restrictions for this procedure. There is a possibility that an IV may need to be started during your test to inject an image enhancing agent. This is done to obtain more optimal pictures of your heart. Therefore we ask that you do at least drink some water prior to coming in to hydrate your veins.     2) Ankle Brachial Index (ABI's): - Your physician has requested that you have an ankle brachial index (ABI). During this test an ultrasound and blood pressure cuff are used to evaluate the arteries that supply the arms and legs with blood. Allow thirty minutes for this exam. There are no restrictions or special instructions.    3) Lower Extremity Arterial Duplex: - Your physician has requested  that you have a lower extremity arterial duplex. This test is an ultrasound of the arteries in the legs. It looks at arterial blood flow in the legs. Allow one hour for Lower Arterial scans. There are no restrictions or special instructions   Follow-Up: At [facility], you and your health needs are our priority.  As part of our continuing mission to provide you with exceptional heart care, we have created designated Provider Care Teams.  These Care Teams include your primary Cardiologist (physician) and Advanced Practice Providers (APPs -  Physician Assistants and Nurse Practitioners) who all work together to provide you with the care you need, when you need it.  We recommend signing up for the patient portal called "MyChart".  Sign up information is provided on this After Visit Summary.  MyChart is used to connect with patients for Virtual Visits (Telemedicine).  Patients are able to view lab/test results, encounter notes, upcoming appointments, etc.  Non-urgent messages can be sent to your provider as well.   To learn more about what you can do with MyChart, go to [email or web address].    Your next appointment:   2 month(s)  The format for your next appointment:   In Person  Provider:   You may see Ida Rogue, MD or one of the following Advanced Practice Providers on your designated Care Team:    Christell Faith, PA-C    Other Instructions N/a  Important Information About Sugar

## 2021-11-25 ENCOUNTER — Telehealth: Payer: Self-pay

## 2021-11-25 ENCOUNTER — Other Ambulatory Visit
Admission: RE | Admit: 2021-11-25 | Discharge: 2021-11-25 | Disposition: A | Discharge status: Home / Self Care | Payer: Medicaid Other | Attending: Physician Assistant | Admitting: Physician Assistant

## 2021-11-25 DIAGNOSIS — I10 Essential (primary) hypertension: Secondary | ICD-10-CM | POA: Insufficient documentation

## 2021-11-25 DIAGNOSIS — I421 Obstructive hypertrophic cardiomyopathy: Secondary | ICD-10-CM | POA: Insufficient documentation

## 2021-11-25 LAB — COMPREHENSIVE METABOLIC PANEL
ALT: 22 U/L (ref 0–44)
AST: 23 U/L (ref 15–41)
Albumin: 4 g/dL (ref 3.5–5.0)
Alkaline Phosphatase: 63 U/L (ref 38–126)
Anion gap: 6 (ref 5–15)
BUN: 24 mg/dL — ABNORMAL HIGH (ref 6–20)
CO2: 20 mmol/L — ABNORMAL LOW (ref 22–32)
Calcium: 9.1 mg/dL (ref 8.9–10.3)
Chloride: 111 mmol/L (ref 98–111)
Creatinine, Ser: 0.97 mg/dL (ref 0.61–1.24)
GFR, Estimated: >60 mL/min (ref >60)
Glucose, Bld: 159 mg/dL — ABNORMAL HIGH (ref 70–99)
Potassium: 3.6 mmol/L (ref 3.5–5.1)
Sodium: 137 mmol/L (ref 135–145)
Total Bilirubin: 0.8 mg/dL (ref 0.3–1.2)
Total Protein: 6.6 g/dL (ref 6.5–8.1)

## 2021-11-25 LAB — CBC
HCT: 44.4 % (ref 39.0–52.0)
Hemoglobin: 15.4 g/dL (ref 13.0–17.0)
MCH: 28.9 pg (ref 26.0–34.0)
MCHC: 34.7 g/dL (ref 30.0–36.0)
MCV: 83.3 fL (ref 80.0–100.0)
Platelets: 181 10*3/uL (ref 150–400)
RBC: 5.33 MIL/uL (ref 4.22–5.81)
RDW: 11.7 % (ref 11.5–15.5)
WBC: 5.6 10*3/uL (ref 4.0–10.5)
nRBC: 0 % (ref 0.0–0.2)

## 2021-11-25 NOTE — Telephone Encounter (Signed)
Attempted to contact pt.  No answer, vm box not set up yet.

## 2021-11-25 NOTE — Telephone Encounter (Signed)
-----   Message from Sondra Barges, PA-C sent at 11/25/2021  3:54 PM EDT ----- Please inform the patient his potassium is improved and now normal Random glucose is mildly elevated BUN is mildly elevated with normal creatinine Liver function normal Blood count normal  Please have him increase water intake, otherwise labs are stable to improved

## 2021-11-26 ENCOUNTER — Other Ambulatory Visit: Payer: Self-pay | Admitting: Cardiovascular Disease

## 2021-11-27 MED ORDER — TADALAFIL 20 MG PO TABS: 20.0000 mg | ORAL_TABLET | Freq: Every day | ORAL | 0 refills | Status: DC | PRN

## 2021-11-27 NOTE — Telephone Encounter (Signed)
Results reviewed by patient via My Chart. Closing encounter.

## 2021-12-19 ENCOUNTER — Ambulatory Visit (INDEPENDENT_AMBULATORY_CARE_PROVIDER_SITE_OTHER): Payer: Self-pay

## 2021-12-19 DIAGNOSIS — I421 Obstructive hypertrophic cardiomyopathy: Secondary | ICD-10-CM

## 2021-12-19 DIAGNOSIS — I739 Peripheral vascular disease, unspecified: Secondary | ICD-10-CM

## 2021-12-19 DIAGNOSIS — S81809D Unspecified open wound, unspecified lower leg, subsequent encounter: Secondary | ICD-10-CM

## 2021-12-19 DIAGNOSIS — S81809A Unspecified open wound, unspecified lower leg, initial encounter: Secondary | ICD-10-CM

## 2021-12-19 LAB — ECHOCARDIOGRAM COMPLETE
AV Vena cont: 0.4 cm
Area-P 1/2: 2.26 cm2
Calc EF: 59.1 %
P 1/2 time: 708 msec
S' Lateral: 2.7 cm
Single Plane A2C EF: 56.9 %
Single Plane A4C EF: 58.8 %

## 2021-12-23 ENCOUNTER — Telehealth: Payer: Self-pay | Admitting: *Deleted

## 2021-12-23 NOTE — Telephone Encounter (Signed)
Pt is returning call. Requesting call back.  

## 2021-12-23 NOTE — Telephone Encounter (Signed)
Reviewed results with patient and he inquired about echocardiogram. Advised that we would call back with those results once provider reviews. He verbalized understanding with no further questions at this time.

## 2021-12-23 NOTE — Telephone Encounter (Signed)
-----   Message from Sondra Barges, PA-C sent at 12/21/2021 10:33 AM EDT ----- Please inform the patient his lower extremity ultrasound was normal with no evidence of blockage. See echo as well.

## 2021-12-23 NOTE — Telephone Encounter (Signed)
No answer and no voicemail box set up.  

## 2021-12-28 ENCOUNTER — Telehealth: Payer: Self-pay | Admitting: Cardiovascular Disease

## 2021-12-30 NOTE — Telephone Encounter (Signed)
Will defer to Dr. Mariah Milling regarding the Cialis refill.  This was just filled for # 30 tablets on 11/27/21 w/ no refills.
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
## 2021-12-31 NOTE — Telephone Encounter (Signed)
Jake Iba, MD  Sent: Wed December 31, 2021 11:12 AM  To: Jefferey Pica, RN          Message  My assumption would be he does not need another refill yet  Thx  TGollan

## 2022-01-02 ENCOUNTER — Ambulatory Visit (INDEPENDENT_AMBULATORY_CARE_PROVIDER_SITE_OTHER): Payer: Self-pay | Admitting: Physician Assistant

## 2022-01-02 ENCOUNTER — Encounter: Payer: Self-pay | Admitting: Physician Assistant

## 2022-01-02 ENCOUNTER — Other Ambulatory Visit
Admission: RE | Admit: 2022-01-02 | Discharge: 2022-01-02 | Disposition: A | Discharge status: Home / Self Care | Payer: Medicaid Other | Source: Ambulatory Visit | Attending: Physician Assistant | Admitting: Physician Assistant

## 2022-01-02 VITALS — BP 128/92 | HR 74 | Ht 71.0 in | Wt 223.4 lb

## 2022-01-02 DIAGNOSIS — I421 Obstructive hypertrophic cardiomyopathy: Secondary | ICD-10-CM

## 2022-01-02 DIAGNOSIS — R5383 Other fatigue: Secondary | ICD-10-CM

## 2022-01-02 DIAGNOSIS — R5381 Other malaise: Secondary | ICD-10-CM

## 2022-01-02 DIAGNOSIS — I10 Essential (primary) hypertension: Secondary | ICD-10-CM

## 2022-01-02 LAB — COMPREHENSIVE METABOLIC PANEL
ALT: 23 U/L (ref 0–44)
AST: 20 U/L (ref 15–41)
Albumin: 4.3 g/dL (ref 3.5–5.0)
Alkaline Phosphatase: 64 U/L (ref 38–126)
Anion gap: 6 (ref 5–15)
BUN: 35 mg/dL — ABNORMAL HIGH (ref 6–20)
CO2: 24 mmol/L (ref 22–32)
Calcium: 9.4 mg/dL (ref 8.9–10.3)
Chloride: 110 mmol/L (ref 98–111)
Creatinine, Ser: 1.05 mg/dL (ref 0.61–1.24)
GFR, Estimated: >60 mL/min (ref >60)
Glucose, Bld: 89 mg/dL (ref 70–99)
Potassium: 4.8 mmol/L (ref 3.5–5.1)
Sodium: 140 mmol/L (ref 135–145)
Total Bilirubin: 0.7 mg/dL (ref 0.3–1.2)
Total Protein: 7 g/dL (ref 6.5–8.1)

## 2022-01-02 LAB — CBC
HCT: 45.9 % (ref 39.0–52.0)
Hemoglobin: 15.7 g/dL (ref 13.0–17.0)
MCH: 29.3 pg (ref 26.0–34.0)
MCHC: 34.2 g/dL (ref 30.0–36.0)
MCV: 85.8 fL (ref 80.0–100.0)
Platelets: 220 10*3/uL (ref 150–400)
RBC: 5.35 MIL/uL (ref 4.22–5.81)
RDW: 11.9 % (ref 11.5–15.5)
WBC: 7.5 10*3/uL (ref 4.0–10.5)
nRBC: 0 % (ref 0.0–0.2)

## 2022-01-02 LAB — TSH: TSH: 3.414 u[IU]/mL (ref 0.350–4.500)

## 2022-01-02 LAB — CK: Total CK: 166 U/L (ref 49–397)

## 2022-01-02 MED ORDER — TADALAFIL 20 MG PO TABS: 20.0000 mg | ORAL_TABLET | Freq: Every day | ORAL | 10 refills | Status: DC | PRN

## 2022-01-02 NOTE — Patient Instructions (Signed)
Medication Instructions:  No changes at this time.   *If you need a refill on your cardiac medications before your next appointment, please call your pharmacy*   Lab Work: CBC, TSH, CK, and CMET to be done today over at the [facility].   If you have labs (blood work) drawn today and your tests are completely normal, you will receive your results only by: MyChart Message (if you have MyChart) OR A paper copy in the mail If you have any lab test that is abnormal or we need to change your treatment, we will call you to review the results.   Testing/Procedures: None   Follow-Up: At [facility], you and your health needs are our priority.  As part of our continuing mission to provide you with exceptional heart care, we have created designated Provider Care Teams.  These Care Teams include your primary Cardiologist (physician) and Advanced Practice Providers (APPs -  Physician Assistants and Nurse Practitioners) who all work together to provide you with the care you need, when you need it.   Your next appointment:   3 month(s)  The format for your next appointment:   In Person  Provider:   Julien Nordmann, MD ONLY   Important Information About Sugar
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
## 2022-04-17 ENCOUNTER — Encounter: Payer: Self-pay | Admitting: Cardiovascular Disease

## 2022-04-17 ENCOUNTER — Ambulatory Visit: Payer: Self-pay | Attending: Cardiovascular Disease | Admitting: Cardiovascular Disease

## 2022-04-17 VITALS — BP 134/90 | HR 86 | Ht 71.0 in | Wt 227.5 lb

## 2022-04-17 DIAGNOSIS — I421 Obstructive hypertrophic cardiomyopathy: Secondary | ICD-10-CM

## 2022-04-17 DIAGNOSIS — E782 Mixed hyperlipidemia: Secondary | ICD-10-CM

## 2022-04-17 DIAGNOSIS — I10 Essential (primary) hypertension: Secondary | ICD-10-CM

## 2022-04-17 MED ORDER — TADALAFIL 20 MG PO TABS: 20.0000 mg | ORAL_TABLET | Freq: Every day | ORAL | 6 refills | Status: DC | PRN

## 2022-04-17 MED ORDER — METOPROLOL TARTRATE 25 MG PO TABS: 25.0000 mg | ORAL_TABLET | Freq: Two times a day (BID) | ORAL | 3 refills | Status: DC

## 2022-04-17 NOTE — Patient Instructions (Addendum)
Medication Instructions:  - Your physician has recommended you make the following change in your medication:   1) START metoprolol tartrate 25 mg: - take 1 tablet by mouth twice a day  If you need a refill on your cardiac medications before your next appointment, please call your pharmacy.    Lab work: No new labs needed   Testing/Procedures: No new testing needed   Follow-Up: At [facility], you and your health needs are our priority.  As part of our continuing mission to provide you with exceptional heart care, we have created designated Provider Care Teams.  These Care Teams include your primary Cardiologist (physician) and Advanced Practice Providers (APPs -  Physician Assistants and Nurse Practitioners) who all work together to provide you with the care you need, when you need it.  You will need a follow up appointment in 12 months  Providers on your designated Care Team:   Nicolasa Ducking, NP Eula Listen, PA-C Cadence Fransico Michael, [place]  COVID-19 Vaccine Information can be found at: [email or web address] For questions related to vaccine distribution or appointments, please email [email or web address] or call [phone].     Metoprolol Tablets What is this medication? METOPROLOL (me TOE proe lole) treats high blood pressure. It also prevents chest pain (angina) or further damage after a heart attack. It works by lowering your blood pressure and heart rate, making it easier for your heart to pump blood to the rest of your body. It belongs to a group of medications called beta blockers. This medicine may be used for other purposes; ask your health care provider or pharmacist if you have questions. COMMON BRAND NAME(S): Lopressor What should I tell my care team before I take this medication? They need to know if you have any of these conditions: Diabetes Heart or vessel disease, such as slow heartbeat, worsening heart  failure, heart block, sick sinus syndrome, or Raynaud syndrome Kidney disease Liver disease Lung or breathing disease, such as asthma or emphysema Pheochromocytoma Thyroid disease An unusual or allergic reaction to metoprolol, other medications, foods, dyes, or preservatives Pregnant or trying to get pregnant Breastfeeding How should I use this medication? Take this medication by mouth with water. Take it as directed on the prescription label at the same time every day. You can take it with or without food. You should always take it the same way. Keep taking it unless your care team tells you to stop. Talk to your care team about the use of this medication in children. Special care may be needed. Overdosage: If you think you have taken too much of this medicine contact a poison control center or emergency room at once. NOTE: This medicine is only for you. Do not share this medicine with others. What if I miss a dose? If you miss a dose, take it as soon as you can. If it is almost time for your next dose, take only that dose. Do not take double or extra doses. What may interact with this medication? This medication may interact with the following: Certain medications for blood pressure, heart disease, irregular heartbeat Certain medications for depression like monoamine oxidase (MAO) inhibitors, fluoxetine, or paroxetine Clonidine Dobutamine Epinephrine Isoproterenol Reserpine This list may not describe all possible interactions. Give your health care provider a list of all the medicines, herbs, non-prescription drugs, or dietary supplements you use. Also tell them if you smoke, drink alcohol, or use illegal drugs. Some items may interact with your medicine. What should I watch for while using this  medication? Visit your care team for regular checks on your progress. Check your blood pressure as directed. Know what your blood pressure should be and when to contact your care team. Do not  treat yourself for coughs, colds, or pain while you are using this medication without asking your care team for advice. Some medications may increase your blood pressure. This medication may affect your coordination, reaction time, or judgment. Do not drive or operate machinery until you know how this medication affects you. Sit up or stand slowly to reduce the risk of dizzy or fainting spells. Drinking alcohol with this medication can increase the risk of these side effects. This medication may increase blood sugar. Ask your care team if changes in diet or medications are needed if you have diabetes. What side effects may I notice from receiving this medication? Side effects that you should report to your care team as soon as possible: Allergic reactions--skin rash, itching, hives, swelling of the face, lips, tongue, or throat Heart failure--shortness of breath, swelling of the ankles, feet, or hands, sudden weight gain, unusual weakness or fatigue Low blood pressure--dizziness, feeling faint or lightheaded, blurry vision Raynaud's--cool, numb, or painful fingers or toes that may change color from pale, to blue, to red Slow heartbeat--dizziness, feeling faint or lightheaded, confusion, trouble breathing, unusual weakness or fatigue Worsening mood, feelings of depression Side effects that usually do not require medical attention (report to your care team if they continue or are bothersome): Change in sex drive or performance Diarrhea Dizziness Fatigue Headache This list may not describe all possible side effects. Call your doctor for medical advice about side effects. You may report side effects to FDA at 1-800-FDA-1088. Where should I keep my medication? Keep out of the reach of children and pets. Store at room temperature between 15 and 30 degrees C (59 and 86 degrees F). Protect from moisture. Keep the container tightly closed. Throw away any unused medication after the expiration date. NOTE:  This sheet is a summary. It may not cover all possible information. If you have questions about this medicine, talk to your doctor, pharmacist, or health care provider.  2023 Elsevier/Gold Standard (2020-07-26 00:00:00)

## 2022-05-15 DIAGNOSIS — Z419 Encounter for procedure for purposes other than remedying health state, unspecified: Secondary | ICD-10-CM | POA: Diagnosis not present

## 2022-05-29 ENCOUNTER — Telehealth: Payer: Self-pay | Admitting: Cardiovascular Disease
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
## 2022-06-05 ENCOUNTER — Encounter: Payer: Self-pay | Admitting: Cardiovascular Disease

## 2022-06-15 DIAGNOSIS — Z419 Encounter for procedure for purposes other than remedying health state, unspecified: Secondary | ICD-10-CM | POA: Diagnosis not present

## 2022-06-24 ENCOUNTER — Encounter: Payer: Self-pay | Admitting: *Deleted

## 2022-06-30 ENCOUNTER — Telehealth: Payer: Medicaid Other | Admitting: Physician Assistant

## 2022-06-30 DIAGNOSIS — J208 Acute bronchitis due to other specified organisms: Secondary | ICD-10-CM

## 2022-06-30 DIAGNOSIS — B9689 Other specified bacterial agents as the cause of diseases classified elsewhere: Secondary | ICD-10-CM

## 2022-06-30 MED ORDER — AZITHROMYCIN 250 MG PO TABS: ORAL_TABLET | ORAL | 0 refills | Status: AC

## 2022-06-30 MED ORDER — PROMETHAZINE-DM 6.25-15 MG/5ML PO SYRP: 5.0000 mL | ORAL_SOLUTION | Freq: Four times a day (QID) | ORAL | 0 refills | Status: DC | PRN

## 2022-06-30 MED ORDER — PREDNISONE 10 MG (21) PO TBPK: ORAL_TABLET | ORAL | 0 refills | Status: DC

## 2022-06-30 NOTE — Patient Instructions (Signed)
Jake Elliott, thank you for joining Margaretann Loveless, PA-C for today's virtual visit.  While this provider is not your primary care provider (PCP), if your PCP is located in our provider database this encounter information will be shared with them immediately following your visit.   A [facility] MyChart account gives you access to today's visit and all your visits, tests, and labs performed at [facility] " click here if you don't have a [facility] MyChart account or go to [email or web address]  Consent: (Patient) Jake Elliott provided verbal consent for this virtual visit at the beginning of the encounter.  Current Medications:  Current Outpatient Medications:    Aspirin-Caffeine (BC FAST PAIN RELIEF PO), Take by mouth as needed., Disp: , Rfl:    azithromycin (ZITHROMAX) 250 MG tablet, Take 2 tablets on day 1, then 1 tablet daily on days 2 through 5, Disp: 6 tablet, Rfl: 0   diphenhydrAMINE (BENADRYL) 25 MG tablet, Take 3 tablets (75 mg total) by mouth at bedtime as needed., Disp: 90 tablet, Rfl: 1   Melatonin 10 MG CAPS, Take 10 mg by mouth at bedtime., Disp: 30 capsule, Rfl: 1   metoprolol tartrate (LOPRESSOR) 25 MG tablet, Take 1 tablet (25 mg total) by mouth 2 (two) times daily., Disp: 180 tablet, Rfl: 3   predniSONE (STERAPRED UNI-PAK 21 TAB) 10 MG (21) TBPK tablet, 6 day taper; take as directed on package instructions, Disp: 21 tablet, Rfl: 0   promethazine-dextromethorphan (PROMETHAZINE-DM) 6.25-15 MG/5ML syrup, Take 5 mLs by mouth 4 (four) times daily as needed., Disp: 118 mL, Rfl: 0   tadalafil (CIALIS) 20 MG tablet, Take 1 tablet (20 mg total) by mouth daily as needed for erectile dysfunction., Disp: 30 tablet, Rfl: 6   Medications ordered in this encounter:  Meds ordered this encounter  Medications   azithromycin (ZITHROMAX) 250 MG tablet    Sig: Take 2 tablets on day 1, then 1 tablet daily on days 2 through 5    Dispense:  6 tablet    Refill:  0    Order  Specific Question:   Supervising Provider    Answer:   Merrilee Jansky [[id number]]   predniSONE (STERAPRED UNI-PAK 21 TAB) 10 MG (21) TBPK tablet    Sig: 6 day taper; take as directed on package instructions    Dispense:  21 tablet    Refill:  0    Order Specific Question:   Supervising Provider    Answer:   Merrilee Jansky [[id number]]   promethazine-dextromethorphan (PROMETHAZINE-DM) 6.25-15 MG/5ML syrup    Sig: Take 5 mLs by mouth 4 (four) times daily as needed.    Dispense:  118 mL    Refill:  0    Order Specific Question:   Supervising Provider    Answer:   Merrilee Jansky [[id number]]     *If you need refills on other medications prior to your next appointment, please contact your pharmacy*  Follow-Up: Call back or seek an in-person evaluation if the symptoms worsen or if the condition fails to improve as anticipated.  [facility] [phone]  Other Instructions  Acute Bronchitis, Adult  Acute bronchitis is sudden inflammation of the main airways (bronchi) that come off the windpipe (trachea) in the lungs. The swelling causes the airways to get smaller and make more mucus than normal. This can make it hard to breathe and can cause coughing or noisy breathing (wheezing). Acute bronchitis may last several weeks. The cough may last  longer. Allergies, asthma, and exposure to smoke may make the condition worse. What are the causes? This condition can be caused by germs and by substances that irritate the lungs, including: Cold and flu viruses. The most common cause of this condition is the virus that causes the common cold. Bacteria. This is less common. Breathing in substances that irritate the lungs, including: Smoke from cigarettes and other forms of tobacco. Dust and pollen. Fumes from household cleaning products, gases, or burned fuel. Indoor or outdoor air pollution. What increases the risk? The following factors may make you more likely to develop this  condition: A weak body's defense system, also called the immune system. A condition that affects your lungs and breathing, such as asthma. What are the signs or symptoms? Common symptoms of this condition include: Coughing. This may bring up clear, yellow, or green mucus from your lungs (sputum). Wheezing. Runny or stuffy nose. Having too much mucus in your lungs (chest congestion). Shortness of breath. Aches and pains, including sore throat or chest. How is this diagnosed? This condition is usually diagnosed based on: Your symptoms and medical history. A physical exam. You may also have other tests, including tests to rule out other conditions, such as pneumonia. These tests include: A test of lung function. Test of a mucus sample to look for the presence of bacteria. Tests to check the oxygen level in your blood. Blood tests. Chest X-ray. How is this treated? Most cases of acute bronchitis clear up over time without treatment. Your health care provider may recommend: Drinking more fluids to help thin your mucus so it is easier to cough up. Taking inhaled medicine (inhaler) to improve air flow in and out of your lungs. Using a vaporizer or a humidifier. These are machines that add water to the air to help you breathe better. Taking a medicine that thins mucus and clears congestion (expectorant). Taking a medicine that prevents or stops coughing (cough suppressant). It is not common to take an antibiotic medicine for this condition. Follow these instructions at home:  Take over-the-counter and prescription medicines only as told by your health care provider. Use an inhaler, vaporizer, or humidifier as told by your health care provider. Take two teaspoons (10 mL) of honey at bedtime to lessen coughing at night. Drink enough fluid to keep your urine pale yellow. Do not use any products that contain nicotine or tobacco. These products include cigarettes, chewing tobacco, and vaping  devices, such as e-cigarettes. If you need help quitting, ask your health care provider. Get plenty of rest. Return to your normal activities as told by your health care provider. Ask your health care provider what activities are safe for you. Keep all follow-up visits. This is important. How is this prevented? To lower your risk of getting this condition again: Wash your hands often with soap and water for at least 20 seconds. If soap and water are not available, use hand sanitizer. Avoid contact with people who have cold symptoms. Try not to touch your mouth, nose, or eyes with your hands. Avoid breathing in smoke or chemical fumes. Breathing smoke or chemical fumes will make your condition worse. Get the flu shot every year. Contact a health care provider if: Your symptoms do not improve after 2 weeks. You have trouble coughing up the mucus. Your cough keeps you awake at night. You have a fever. Get help right away if you: Cough up blood. Feel pain in your chest. Have severe shortness of breath. Faint  or keep feeling like you are going to faint. Have a severe headache. Have a fever or chills that get worse. These symptoms may represent a serious problem that is an emergency. Do not wait to see if the symptoms will go away. Get medical help right away. Call your local emergency services (911 in the [place] Do not drive yourself to the hospital. Summary Acute bronchitis is inflammation of the main airways (bronchi) that come off the windpipe (trachea) in the lungs. The swelling causes the airways to get smaller and make more mucus than normal. Drinking more fluids can help thin your mucus so it is easier to cough up. Take over-the-counter and prescription medicines only as told by your health care provider. Do not use any products that contain nicotine or tobacco. These products include cigarettes, chewing tobacco, and vaping devices, such as e-cigarettes. If you need help quitting, ask  your health care provider. Contact a health care provider if your symptoms do not improve after 2 weeks. This information is not intended to replace advice given to you by your health care provider. Make sure you discuss any questions you have with your health care provider. Document Revised: 09/11/2021 Document Reviewed: 10/02/2020 Elsevier Patient Education  [place].    If you have been instructed to have an in-person evaluation today at a local Urgent Care facility, please use the link below. It will take you to a list of all of our available [facility], including address, phone number and hours of operation. Please do not delay care.  [facility]  If you or a family member do not have a primary care provider, use the link below to schedule a visit and establish care. When you choose a [facility] primary care physician or advanced practice provider, you gain a long-term partner in health. Find a Primary Care Provider  Learn more about [facility]'s in-office and virtual care options: [facility]

## 2022-06-30 NOTE — Progress Notes (Signed)
Virtual Visit Consent   Jake Elliott, you are scheduled for a virtual visit with a [facility] provider today. Just as with appointments in the office, your consent must be obtained to participate. Your consent will be active for this visit and any virtual visit you may have with one of our providers in the next 365 days. If you have a MyChart account, a copy of this consent can be sent to you electronically.  As this is a virtual visit, video technology does not allow for your provider to perform a traditional examination. This may limit your provider's ability to fully assess your condition. If your provider identifies any concerns that need to be evaluated in person or the need to arrange testing (such as labs, EKG, etc.), we will make arrangements to do so. Although advances in technology are sophisticated, we cannot ensure that it will always work on either your end or our end. If the connection with a video visit is poor, the visit may have to be switched to a telephone visit. With either a video or telephone visit, we are not always able to ensure that we have a secure connection.  By engaging in this virtual visit, you consent to the provision of healthcare and authorize for your insurance to be billed (if applicable) for the services provided during this visit. Depending on your insurance coverage, you may receive a charge related to this service.  I need to obtain your verbal consent now. Are you willing to proceed with your visit today? Jake Elliott has provided verbal consent on 06/30/2022 for a virtual visit (video or telephone). Mar Daring, PA-C  Date: 06/30/2022 7:42 PM  Virtual Visit via Video Note   I, Mar Daring, connected with  Jake Elliott  ([id number], [birth year]) on 06/30/22 at  7:30 PM EST by a video-enabled telemedicine application and verified that I am speaking with the correct person using two identifiers.  Location: Patient: Virtual Visit Location Patient:  Home Provider: Virtual Visit Location Provider: Home Office   I discussed the limitations of evaluation and management by telemedicine and the availability of in person appointments. The patient expressed understanding and agreed to proceed.    History of Present Illness: Jake Elliott is a 52 y.o. who identifies as a male who was assigned male at birth, and is being seen today for cough.  HPI: Cough This is a new problem. The current episode started 1 to 4 weeks ago. The problem has been gradually worsening. The problem occurs every few minutes. The cough is Productive of sputum and productive of purulent sputum. Associated symptoms include chills, a fever (last week), nasal congestion, postnasal drip, rhinorrhea and a sore throat. Pertinent negatives include no myalgias.   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   Chronic pain syndrome 07/14/2016   Peripheral autonomic neuropathy of unknown cause 11/29/2014   Cognitive decline 11/23/2014   History of open heart surgery 11/23/2014   Chronic LBP 01/25/2013   Chronic cervical pain 01/25/2013   Affective bipolar disorder ([place]) 01/25/2013   BP (high blood pressure) 01/25/2013  Cardiomyopathy, 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[place]) 01/25/2013   Long term current use of opiate analgesic 01/25/2013   Disorder of peripheral nervous system 01/25/2013   Bipolar affective disorder ([place]) 01/25/2013   Hypertrophic obstructive cardiomyopathy ([place]) 01/25/2013   Peripheral nerve disease 01/25/2013   Essential hypertension  11/24/2012   Chest pain 11/24/2012   Tachycardia 09/15/2011   Edema 01/29/2011   HOCM (hypertrophic obstructive cardiomyopathy) ([place]) 01/29/2011   OSA (obstructive sleep apnea) 01/29/2011    Allergies:  Allergies  Allergen Reactions   Acetaminophen Other (See Comments)    headache   Bystolic [Nebivolol Hcl]     Headaches   Codeine     Nausea & headache Other reaction(s): HEADACHE   Morphine And Related     Nausea & headache.   Medications:  Current Outpatient Medications:    Aspirin-Caffeine (BC FAST PAIN RELIEF PO), Take by mouth as needed., Disp: , Rfl:    azithromycin (ZITHROMAX) 250 MG tablet, Take 2 tablets on day 1, then 1 tablet daily on days 2 through 5, Disp: 6 tablet, Rfl: 0   diphenhydrAMINE (BENADRYL) 25 MG tablet, Take 3 tablets (75 mg total) by mouth at bedtime as needed., Disp: 90 tablet, Rfl: 1   Melatonin 10 MG CAPS, Take 10 mg by mouth at bedtime., Disp: 30 capsule, Rfl: 1   metoprolol tartrate (LOPRESSOR) 25 MG tablet, Take 1 tablet (25 mg total) by mouth 2 (two) times daily., Disp: 180 tablet, Rfl: 3   predniSONE (STERAPRED UNI-PAK 21 TAB) 10 MG (21) TBPK tablet, 6 day taper; take as directed on package instructions, Disp: 21 tablet, Rfl: 0   promethazine-dextromethorphan (PROMETHAZINE-DM) 6.25-15 MG/5ML syrup, Take 5 mLs by mouth 4 (four) times daily as needed., Disp: 118 mL, Rfl: 0   tadalafil (CIALIS) 20 MG tablet, Take 1 tablet (20 mg total) by mouth daily as needed for erectile dysfunction., Disp: 30 tablet, Rfl: 6  Observations/Objective: Patient is well-developed, well-nourished in no acute distress.  Resting comfortably at home.  Head is normocephalic, atraumatic.  No labored breathing.  Speech is clear and coherent with logical content.  Patient is alert and oriented at baseline.    Assessment and Plan: 1. Acute bacterial bronchitis - azithromycin (ZITHROMAX) 250 MG tablet; Take 2 tablets on day 1, then 1 tablet daily on days 2 through 5   Dispense: 6 tablet; Refill: 0 - predniSONE (STERAPRED UNI-PAK 21 TAB) 10 MG (21) TBPK tablet; 6 day taper; take as directed on package instructions  Dispense: 21 tablet; Refill: 0 - promethazine-dextromethorphan (PROMETHAZINE-DM) 6.25-15 MG/5ML syrup; Take 5 mLs by mouth 4 (four) times daily as needed.  Dispense: 118 mL; Refill: 0  - Worsening over a week despite OTC medications - Will treat with Z-pack, Prednisone and Promethazine DM - Can continue Mucinex  - Push fluids.  - Rest.  - Steam and humidifier can help - Seek in person evaluation if worsening or symptoms fail to improve    Follow Up Instructions: I discussed the assessment and treatment plan with the patient. The patient was provided an opportunity to ask questions and all were answered. The patient agreed with the plan and demonstrated an understanding of the instructions.  A copy of instructions were sent to the patient via MyChart unless otherwise noted below.    The patient was advised to call back or seek an in-person evaluation if the symptoms worsen or if the condition fails to improve as anticipated.  Time:  I spent 10 minutes with the patient via telehealth technology  discussing the above problems/concerns.    Nailani Full M Geetika Laborde, PA-C  

## 2022-07-06 ENCOUNTER — Emergency Department: Payer: Medicaid Other

## 2022-07-06 ENCOUNTER — Encounter: Payer: Self-pay | Admitting: Emergency Medicine

## 2022-07-06 ENCOUNTER — Emergency Department
Admission: EM | Admit: 2022-07-06 | Discharge: 2022-07-06 | Disposition: A | Discharge status: Home / Self Care | Payer: Medicaid Other | Attending: Emergency Medicine | Admitting: Emergency Medicine

## 2022-07-06 ENCOUNTER — Other Ambulatory Visit: Payer: Self-pay

## 2022-07-06 DIAGNOSIS — Z1152 Encounter for screening for COVID-19: Secondary | ICD-10-CM | POA: Diagnosis not present

## 2022-07-06 DIAGNOSIS — R051 Acute cough: Secondary | ICD-10-CM | POA: Diagnosis not present

## 2022-07-06 DIAGNOSIS — Z7901 Long term (current) use of anticoagulants: Secondary | ICD-10-CM | POA: Diagnosis not present

## 2022-07-06 DIAGNOSIS — J439 Emphysema, unspecified: Secondary | ICD-10-CM | POA: Diagnosis not present

## 2022-07-06 DIAGNOSIS — Z951 Presence of aortocoronary bypass graft: Secondary | ICD-10-CM | POA: Diagnosis not present

## 2022-07-06 DIAGNOSIS — I10 Essential (primary) hypertension: Secondary | ICD-10-CM | POA: Diagnosis not present

## 2022-07-06 DIAGNOSIS — R059 Cough, unspecified: Secondary | ICD-10-CM | POA: Diagnosis not present

## 2022-07-06 LAB — CBC WITH DIFFERENTIAL/PLATELET
Abs Immature Granulocytes: 0.09 10*3/uL — ABNORMAL HIGH (ref 0.00–0.07)
Basophils Absolute: 0.1 10*3/uL (ref 0.0–0.1)
Basophils Relative: 1 %
Eosinophils Absolute: 0.5 10*3/uL (ref 0.0–0.5)
Eosinophils Relative: 4 %
HCT: 43.1 % (ref 39.0–52.0)
Hemoglobin: 15.1 g/dL (ref 13.0–17.0)
Immature Granulocytes: 1 %
Lymphocytes Relative: 17 %
Lymphs Abs: 1.8 10*3/uL (ref 0.7–4.0)
MCH: 30.1 pg (ref 26.0–34.0)
MCHC: 35 g/dL (ref 30.0–36.0)
MCV: 85.9 fL (ref 80.0–100.0)
Monocytes Absolute: 1 10*3/uL (ref 0.1–1.0)
Monocytes Relative: 9 %
Neutro Abs: 7.2 10*3/uL (ref 1.7–7.7)
Neutrophils Relative %: 68 %
Platelets: 323 10*3/uL (ref 150–400)
RBC: 5.02 MIL/uL (ref 4.22–5.81)
RDW: 11.7 % (ref 11.5–15.5)
WBC: 10.7 10*3/uL — ABNORMAL HIGH (ref 4.0–10.5)
nRBC: 0 % (ref 0.0–0.2)

## 2022-07-06 LAB — COMPREHENSIVE METABOLIC PANEL
ALT: 32 U/L (ref 0–44)
AST: 28 U/L (ref 15–41)
Albumin: 4 g/dL (ref 3.5–5.0)
Alkaline Phosphatase: 70 U/L (ref 38–126)
Anion gap: 9 (ref 5–15)
BUN: 30 mg/dL — ABNORMAL HIGH (ref 6–20)
CO2: 22 mmol/L (ref 22–32)
Calcium: 8.8 mg/dL — ABNORMAL LOW (ref 8.9–10.3)
Chloride: 106 mmol/L (ref 98–111)
Creatinine, Ser: 0.96 mg/dL (ref 0.61–1.24)
GFR, Estimated: >60 mL/min (ref >60)
Glucose, Bld: 104 mg/dL — ABNORMAL HIGH (ref 70–99)
Potassium: 3.7 mmol/L (ref 3.5–5.1)
Sodium: 137 mmol/L (ref 135–145)
Total Bilirubin: 0.7 mg/dL (ref 0.3–1.2)
Total Protein: 6.7 g/dL (ref 6.5–8.1)

## 2022-07-06 LAB — RESP PANEL BY RT-PCR (RSV, FLU A&B, COVID)  RVPGX2
Influenza A by PCR: NEGATIVE
Influenza B by PCR: NEGATIVE
Resp Syncytial Virus by PCR: NEGATIVE
SARS Coronavirus 2 by RT PCR: NEGATIVE

## 2022-07-06 LAB — TROPONIN I (HIGH SENSITIVITY)
Troponin I (High Sensitivity): 15 ng/L (ref <18)
Troponin I (High Sensitivity): 15 ng/L (ref <18)

## 2022-07-06 MED ORDER — SODIUM CHLORIDE 0.9 % IV SOLN
1.0000 g | Freq: Once | INTRAVENOUS | Status: AC
  Administered 2022-07-06: 1 g via INTRAVENOUS
  Filled 2022-07-06: qty 10

## 2022-07-06 MED ORDER — ALBUTEROL SULFATE HFA 108 (90 BASE) MCG/ACT IN AERS: 2.0000 | INHALATION_SPRAY | Freq: Four times a day (QID) | RESPIRATORY_TRACT | 2 refills | Status: DC | PRN

## 2022-07-06 MED ORDER — AMOXICILLIN-POT CLAVULANATE 875-125 MG PO TABS: 1.0000 | ORAL_TABLET | Freq: Two times a day (BID) | ORAL | 0 refills | Status: AC

## 2022-07-06 MED ORDER — DOXYCYCLINE MONOHYDRATE 100 MG PO TABS: 100.0000 mg | ORAL_TABLET | Freq: Two times a day (BID) | ORAL | 0 refills | Status: AC

## 2022-07-06 MED ORDER — APIXABAN 5 MG PO TABS: ORAL_TABLET | ORAL | 1 refills | Status: DC

## 2022-07-06 MED ORDER — HYDROCOD POLI-CHLORPHE POLI ER 10-8 MG/5ML PO SUER: 5.0000 mL | Freq: Two times a day (BID) | ORAL | 0 refills | Status: DC

## 2022-07-06 MED ORDER — APIXABAN 5 MG PO TABS
10.0000 mg | ORAL_TABLET | Freq: Two times a day (BID) | ORAL | Status: DC
  Administered 2022-07-06: 10 mg via ORAL
  Filled 2022-07-06: qty 2

## 2022-07-06 MED ORDER — IOHEXOL 350 MG/ML SOLN
75.0000 mL | Freq: Once | INTRAVENOUS | Status: AC | PRN
  Administered 2022-07-06: 75 mL via INTRAVENOUS

## 2022-07-06 NOTE — ED Triage Notes (Signed)
Patient to ED for cough x1.5 weeks. States it is non-productive.

## 2022-07-06 NOTE — ED Provider Notes (Signed)
Jake Elliott Provider Note  Patient Contact: 10:03 PM (approximate)   History   Cough   HPI  Jake Elliott is a 52 y.o. male with a history of CABG for myomectomy secondary to hypertrophic cardiomyopathy presents to the emergency department with cough for the past 3 weeks.  Patient states that his symptoms initially started as cold-like symptoms with low-grade fever and nasal congestion but those symptoms quickly resolved.  Patient states that 10 days ago, he was seen by a telehealth provider and was started on azithromycin and prednisone.  Patient states that his cough has not changed significantly in intensity.  No nausea, vomiting or abdominal pain.      Physical Exam   Triage Vital Signs: ED Triage Vitals  Enc Vitals Group     BP 07/06/22 1728 132/73     Pulse Rate 07/06/22 1728 74     Resp 07/06/22 1728 18     Temp 07/06/22 1728 97.7 F (36.5 C)     Temp Source 07/06/22 1728 Oral     SpO2 07/06/22 1728 98 %     Weight --      Height --      Head Circumference --      Peak Flow --      Pain Score 07/06/22 1727 0     Pain Loc --      Pain Edu? --      Excl. in [place]? --     Most recent vital signs: Vitals:   07/06/22 1943 07/06/22 2135  BP: 139/61 (!) 141/64  Pulse: 79 82  Resp: 18 18  Temp: 98 F (36.7 C)   SpO2: 97% 97%     General: Alert and in no acute distress. Eyes:  PERRL. EOMI. Head: No acute traumatic findings ENT:      Nose: No congestion/rhinnorhea.      Mouth/Throat: Mucous membranes are moist. Neck: No stridor. No cervical spine tenderness to palpation. Cardiovascular:  Good peripheral perfusion Respiratory: Normal respiratory effort without tachypnea or retractions. Lungs CTAB. Good air entry to the bases with no decreased or absent breath sounds. Gastrointestinal: Bowel sounds 4 quadrants. Soft and nontender to palpation. No guarding or rigidity. No palpable masses. No distention. No CVA tenderness. Musculoskeletal:  Full range of motion to all extremities.  Neurologic:  No gross focal neurologic deficits are appreciated.  Skin:   No rash noted   ED Results / Procedures / Treatments   Labs (all labs ordered are listed, but only abnormal results are displayed) Labs Reviewed  CBC WITH DIFFERENTIAL/PLATELET - Abnormal; Notable for the following components:      Result Value   WBC 10.7 (*)    Abs Immature Granulocytes 0.09 (*)    All other components within normal limits  COMPREHENSIVE METABOLIC PANEL - Abnormal; Notable for the following components:   Glucose, Bld 104 (*)    BUN 30 (*)    Calcium 8.8 (*)    All other components within normal limits  RESP PANEL BY RT-PCR (RSV, FLU A&B, COVID)  RVPGX2  TROPONIN I (HIGH SENSITIVITY)  TROPONIN I (HIGH SENSITIVITY)       RADIOLOGY  I personally viewed and evaluated these images as part of my medical decision making, as well as reviewing the written report by the radiologist.  ED Provider Interpretation: Findings may represent a small amount of thrombus within the lower branch of left pulmonary vein    PROCEDURES:  Critical Care performed: No  Procedures  MEDICATIONS ORDERED IN ED: Medications  apixaban (ELIQUIS) tablet 10 mg (has no administration in time range)  iohexol (OMNIPAQUE) 350 MG/ML injection 75 mL (75 mLs Intravenous Contrast Given 07/06/22 2004)  cefTRIAXone (ROCEPHIN) 1 g in sodium chloride 0.9 % 100 mL IVPB (1 g Intravenous New Bag/Given 07/06/22 2131)     IMPRESSION / MDM / ASSESSMENT AND PLAN / ED COURSE  I reviewed the triage vital signs and the nursing notes.                              Assessment and plan Cough 52 year old male with past medical history detailed above presents to the emergency department with cough for the past 3 weeks.  Vital signs reassuring at triage.  Patient also had a reassuring ambulatory vital signs.  On exam, patient alert and nontoxic-appearing but coughing frequently.  Chest  x-ray showed no signs of pneumonia.  CBC, CMP and troponin within range.  COVID-19 and flu negative.  CTA indicated small amount of thrombus in the lower branch of the left pulmonary vein.  I discussed patient case with attending, Dr. Corky Downs and we agreed to initiate empiric anticoagulation with Eliquis. Patient was started on her first dose of Eliquis in the emergency department.  Given that infection cannot be excluded due to reticulonodular appearance of the lung parenchyma, patient was covered with antibiotic which includes Rocephin in the emergency department and doxycycline and Augmentin at discharge.  I emphasized the importance of following up with pulmonology as an outpatient.  Return precautions were given to return to the emergency department with new or worsening symptoms.  All patient questions were answered.   FINAL CLINICAL IMPRESSION(S) / ED DIAGNOSES   Final diagnoses:  Acute cough     Rx / DC Orders   ED Discharge Orders          Ordered    amoxicillin-clavulanate (AUGMENTIN) 875-125 MG tablet  2 times daily        07/06/22 2144    doxycycline (ADOXA) 100 MG tablet  2 times daily        07/06/22 2144    apixaban (ELIQUIS) 5 MG TABS tablet        07/06/22 2146             Note:  This document was prepared using Dragon voice recognition software and may include unintentional dictation errors.   Vallarie Mare [place], PA-C 07/06/22 2210    Lavonia Drafts, MD 07/06/22 [phone]

## 2022-07-06 NOTE — ED Notes (Signed)
Pt Dc to home. Dc instructions reviewed with all questions answered. Pt voices understanding. IV removed, cath intact, pressure dressing applied. No bleeding noted at site. Pt ambulatory out of dept with steady gait 

## 2022-07-06 NOTE — Discharge Instructions (Signed)
Take 2 tablets of Eliquis in the morning and 2 tablets at night for the first 7 days. After 1 week, take 1 tablet of Eliquis in the morning and 1 tablet at night. Take Augmentin twice daily for the next 7 days. Take doxycycline twice daily for the next 7 days. Please make follow-up appointment with pulmonology

## 2022-07-16 DIAGNOSIS — Z419 Encounter for procedure for purposes other than remedying health state, unspecified: Secondary | ICD-10-CM | POA: Diagnosis not present

## 2022-08-04 ENCOUNTER — Other Ambulatory Visit: Payer: Self-pay

## 2022-08-04 ENCOUNTER — Ambulatory Visit
Admission: RE | Admit: 2022-08-04 | Discharge: 2022-08-04 | Disposition: A | Discharge status: Home / Self Care | Payer: Self-pay | Source: Ambulatory Visit | Attending: Orthopedic Surgery | Admitting: Orthopedic Surgery

## 2022-08-04 DIAGNOSIS — Z049 Encounter for examination and observation for unspecified reason: Secondary | ICD-10-CM
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
## 2022-08-07 ENCOUNTER — Ambulatory Visit: Payer: Medicaid Other | Admitting: Orthopedic Surgery

## 2022-08-10 ENCOUNTER — Encounter: Payer: Self-pay | Admitting: Student in an Organized Health Care Education/Training Program

## 2022-08-10 ENCOUNTER — Ambulatory Visit (INDEPENDENT_AMBULATORY_CARE_PROVIDER_SITE_OTHER): Payer: Medicaid Other | Admitting: Student in an Organized Health Care Education/Training Program

## 2022-08-10 ENCOUNTER — Other Ambulatory Visit: Payer: Self-pay

## 2022-08-10 ENCOUNTER — Telehealth: Payer: Self-pay | Admitting: Cardiovascular Disease

## 2022-08-10 VITALS — BP 132/84 | HR 74 | Temp 97.1°F | Ht 71.0 in | Wt 239.6 lb

## 2022-08-10 DIAGNOSIS — R053 Chronic cough: Secondary | ICD-10-CM

## 2022-08-10 DIAGNOSIS — I2699 Other pulmonary embolism without acute cor pulmonale: Secondary | ICD-10-CM

## 2022-08-10 LAB — NITRIC OXIDE: Nitric Oxide: 19

## 2022-08-10 MED ORDER — OMEPRAZOLE MAGNESIUM 20 MG PO TBEC: 20.0000 mg | DELAYED_RELEASE_TABLET | Freq: Every day | ORAL | 3 refills | Status: DC

## 2022-08-10 MED ORDER — APIXABAN 5 MG PO TABS: 5.0000 mg | ORAL_TABLET | Freq: Two times a day (BID) | ORAL | 2 refills | Status: DC

## 2022-08-10 NOTE — Telephone Encounter (Signed)
Patient is requesting Dr. Rockey Situ to look at notes from Dr. Genia Harold visit today to see if he needs to be seen

## 2022-08-10 NOTE — Progress Notes (Signed)
Synopsis: Referred in for cough and pulmonary vein thrombosis  Assessment & Plan:   1. Subacute cough  Cough of a few weeks in duration, likely post viral given reported symptoms of URTI and fevers prior to presentation. Explained that it takes a while for post viral/post infectious cough to resolve, and that we would broaden the workup should the cough persist on follow up. FENO reassuring at 19 ppb.  - Nitric oxide normal at 19 ppb  2. Thrombus of pulmonary vein (HCC)  Incidentally found to have a thrombus of the pulmonary vein on chest imaging. There is no clear consensus guidelines regarding the management of pulmonary vein thrombus and I did discuss this with the patient. Also explained the risks and implications from said thrombus should it embolize (arterial). He using ibuprofen extensively and I am concerned for PUD given his use of NSAIDS. I will initiate him on omperazole for ulcer prophylaxis to that effect. Furthermore, Jake Elliott is no excited about continuing on anti-coagulation and would rather not take it for a long time. We discussed this at length and will repeat the CT angio of the chest at the three month Jake Elliott to re-evaluate the thrombus. Should it persist, he understands that he will need to be on extended phase anti-coagulation.  - omeprazole (PRILOSEC OTC) 20 MG tablet; Take 1 tablet (20 mg total) by mouth daily.  Dispense: 90 tablet; Refill: 3 - apixaban (ELIQUIS) 5 MG TABS tablet; Take 1 tablet (5 mg total) by mouth 2 (two) times daily.  Dispense: 60 tablet; Refill: 2 - CT Angio Chest Pulmonary Embolism (PE) W or WO Contrast; Future   Return in about 2 months (around 10/09/2022).  I spent 45 minutes caring for this patient today, including preparing to see the patient, obtaining a medical history , reviewing a separately obtained history, performing a medically appropriate examination and/or evaluation, counseling and educating the patient/family/caregiver, ordering  medications, tests, or procedures, and documenting clinical information in the electronic health record  Jake Reichert, MD [facility] 08/10/2022 5:35 PM    End of visit medications:  Meds ordered this encounter  Medications   omeprazole (PRILOSEC OTC) 20 MG tablet    Sig: Take 1 tablet (20 mg total) by mouth daily.    Dispense:  90 tablet    Refill:  3   apixaban (ELIQUIS) 5 MG TABS tablet    Sig: Take 1 tablet (5 mg total) by mouth 2 (two) times daily.    Dispense:  60 tablet    Refill:  2     Current Outpatient Medications:    albuterol (VENTOLIN HFA) 108 (90 Base) MCG/ACT inhaler, Inhale 2 puffs into the lungs every 6 (six) hours as needed for wheezing or shortness of breath., Disp: 8 g, Rfl: 2   apixaban (ELIQUIS) 5 MG TABS tablet, 2 tablets in the morning and 2 tablets at night for the first 7 days.  After 7 days, take 1 tablet in the morning and 1 tablet at night., Disp: 60 tablet, Rfl: 1   apixaban (ELIQUIS) 5 MG TABS tablet, Take 1 tablet (5 mg total) by mouth 2 (two) times daily., Disp: 60 tablet, Rfl: 2   Aspirin-Caffeine (BC FAST PAIN RELIEF PO), Take by mouth as needed., Disp: , Rfl:    diphenhydrAMINE (BENADRYL) 25 MG tablet, Take 3 tablets (75 mg total) by mouth at bedtime as needed., Disp: 90 tablet, Rfl: 1   Melatonin 10 MG CAPS, Take 10 mg by mouth at  bedtime., Disp: 30 capsule, Rfl: 1   metoprolol tartrate (LOPRESSOR) 25 MG tablet, Take 1 tablet (25 mg total) by mouth 2 (two) times daily., Disp: 180 tablet, Rfl: 3   omeprazole (PRILOSEC OTC) 20 MG tablet, Take 1 tablet (20 mg total) by mouth daily., Disp: 90 tablet, Rfl: 3   tadalafil (CIALIS) 20 MG tablet, Take 1 tablet (20 mg total) by mouth daily as needed for erectile dysfunction., Disp: 30 tablet, Rfl: 6   chlorpheniramine-HYDROcodone (TUSSIONEX) 10-8 MG/5ML, Take 5 mLs by mouth 2 (two) times daily. (Patient not taking: Reported on 08/10/2022), Disp: 70 mL, Rfl: 0   predniSONE (STERAPRED  UNI-PAK 21 TAB) 10 MG (21) TBPK tablet, 6 day taper; take as directed on package instructions (Patient not taking: Reported on 08/10/2022), Disp: 21 tablet, Rfl: 0   promethazine-dextromethorphan (PROMETHAZINE-DM) 6.25-15 MG/5ML syrup, Take 5 mLs by mouth 4 (four) times daily as needed. (Patient not taking: Reported on 08/10/2022), Disp: 118 mL, Rfl: 0   Subjective:   PATIENT ID: Jake Elliott: male DOB: 12-Apr-1971, MRN: [id number]  Chief Complaint  Patient presents with   [facility] follow up from 07/06/2022.  Cough with green/brown sputum. SOB. Wheezing. Has enlarged heart.     HPI  Jake Elliott is a pleasant 52 year old male presenting to clinic for the evaluation of cough as well as pulmonary vein embolus.  He reports presenting to the ED in January secondary to acute cough. He reports symptoms of a URTI in addition to a fever that lead to his visit. During his ED stay, blood work and viral testing were normal/negative, while a CTA of the chest was notable for a small thrombus in the lower branch of the left pulmonary vein. He was discharged on Eliquis and asked to follow up with pulmonology. He was given a course of antibiotics without much improvement.  He reports that his cough, while mildly better, persists. It is dry and non-productive. He does not have any fevers, chills, night sweats, weight loss, or chest pain. He does report having a notable headache with his cough. He takes Ibuprofen (reports 10 tabs a day) in addition to tylenol and aspirin for his headaches and multiple aches. He is compliant with eliquis. He does not have any hematuria, hematochezia, hematemesis, or melena.  Patient reports smoking cigarettes (1 pack a day) and has done so for over 15 years. He denies any vaping. He denies any current illicit drug use. 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 No other occupational exposures reported.  Ancillary information  including prior medications, full medical/surgical/family/social histories, and PFTs (when available) are listed below and have been reviewed.   Review of Systems  Constitutional:  Negative for chills, fever, malaise/fatigue and weight loss.  Respiratory:  Positive for cough. Negative for hemoptysis, sputum production, shortness of breath and wheezing.   Cardiovascular:  Negative for chest pain.     Objective:   Vitals:   08/10/22 1418  BP: 132/84  Pulse: 74  Temp: (!) 97.1 F (36.2 C)  SpO2: 95%  Weight: 239 lb 9.6 oz (108.7 kg)  Height: '5\' 11"'$  (1.803 m)   95% on RA  BMI Readings from Last 3 Encounters:  08/10/22 33.42 kg/m  04/17/22 31.73 kg/m  01/02/22 31.16 kg/m   Wt Readings from Last 3 Encounters:  08/10/22 239 lb 9.6 oz (108.7 kg)  04/17/22 227 lb 8 oz (103.2 kg)  01/02/22 223 lb  6.4 oz (101.3 kg)    Physical Exam Constitutional:      Appearance: Normal appearance. He is obese. He is not ill-appearing.  HENT:     Head: Normocephalic.     Nose: Nose normal.     Mouth/Throat:     Mouth: Mucous membranes are moist.  Cardiovascular:     Rate and Rhythm: Normal rate and regular rhythm.     Pulses: Normal pulses.     Heart sounds: Normal heart sounds.  Pulmonary:     Effort: Pulmonary effort is normal.     Breath sounds: Normal breath sounds.  Abdominal:     Palpations: Abdomen is soft.  Musculoskeletal:        General: Normal range of motion.  Neurological:     General: No focal deficit present.     Mental Status: He is alert and oriented to person, place, and time. Mental status is at baseline.     Ancillary Information    Past Medical History:  Diagnosis Date   Angina at rest    Anxiety    Attention-deficit/hyperactivity disorder    Bipolar disorder ([place])    Depression    History of tobacco abuse    Hypertension    Hypertrophic cardiomyopathy ([place])    s/p myomectomy   Peripheral neuropathy    PTSD (post-traumatic stress disorder)       Family History  Problem Relation Age of Onset   Cancer - Lung Mother    Skin cancer Mother    Alcohol abuse Mother    Drug abuse Mother    Stroke Father    Hypertension Father    Alcohol abuse Father    Drug abuse Father    Drug abuse Sister      Past Surgical History:  Procedure Laterality Date   CARDIAC CATHETERIZATION     11/14/2010   CORONARY ARTERY BYPASS GRAFT     for myomectomy for hypertrophic cardiomyopathy    Social History   Socioeconomic History   Marital status: Single    Spouse name: Not on file   Number of children: Not on file   Years of education: Not on file   Highest education level: Not on file  Occupational History   Not on file  Tobacco Use   Smoking status: Every Day    Packs/day: 1.00    Years: 15.00    Total pack years: 15.00    Types: Cigarettes    Start date: 12/31/1988   Smokeless tobacco: Never   Tobacco comments:    1 PPD  Vaping Use   Vaping Use: Never used  Substance and Sexual Activity   Alcohol use: No    Alcohol/week: 0.0 standard drinks of alcohol   Drug use: No   Sexual activity: Yes    Partners: Female    Birth control/protection: None  Other Topics Concern   Not on file  Social History Narrative   Not on file   Social Determinants of Health   Financial Resource Strain: Not on file  Food Insecurity: Not on file  Transportation Needs: Not on file  Physical Activity: Not on file  Stress: Not on file  Social Connections: Not on file  Intimate Partner Violence: Not on file     Allergies  Allergen Reactions   Acetaminophen Other (See Comments)    headache   Bystolic [Nebivolol Hcl]     Headaches   Codeine     Nausea & headache Other reaction(s): HEADACHE   Morphine  And Related     Nausea & headache.     CBC    Component Value Date/Time   WBC 10.7 (H) 07/06/2022 1928   RBC 5.02 07/06/2022 1928   HGB 15.1 07/06/2022 1928   HGB CANCELED 07/05/2019 1530   HCT 43.1 07/06/2022 1928   HCT CANCELED  07/05/2019 1530   PLT 323 07/06/2022 1928   PLT CANCELED 07/05/2019 1530   MCV 85.9 07/06/2022 1928   MCV 82 06/14/2013 2021   MCH 30.1 07/06/2022 1928   MCHC 35.0 07/06/2022 1928   RDW 11.7 07/06/2022 1928   RDW 13.1 06/14/2013 2021   LYMPHSABS 1.8 07/06/2022 1928   LYMPHSABS CANCELED 07/05/2019 1530   LYMPHSABS 1.1 06/14/2013 2021   MONOABS 1.0 07/06/2022 1928   MONOABS 1.0 06/14/2013 2021   EOSABS 0.5 07/06/2022 1928   EOSABS CANCELED 07/05/2019 1530   EOSABS 0.3 06/14/2013 2021   BASOSABS 0.1 07/06/2022 1928   BASOSABS CANCELED 07/05/2019 1530   BASOSABS 0.1 06/14/2013 2021    Pulmonary Functions Testing Results:     No data to display          Outpatient Medications Prior to Visit  Medication Sig Dispense Refill   albuterol (VENTOLIN HFA) 108 (90 Base) MCG/ACT inhaler Inhale 2 puffs into the lungs every 6 (six) hours as needed for wheezing or shortness of breath. 8 g 2   apixaban (ELIQUIS) 5 MG TABS tablet 2 tablets in the morning and 2 tablets at night for the first 7 days.  After 7 days, take 1 tablet in the morning and 1 tablet at night. 60 tablet 1   Aspirin-Caffeine (BC FAST PAIN RELIEF PO) Take by mouth as needed.     diphenhydrAMINE (BENADRYL) 25 MG tablet Take 3 tablets (75 mg total) by mouth at bedtime as needed. 90 tablet 1   Melatonin 10 MG CAPS Take 10 mg by mouth at bedtime. 30 capsule 1   metoprolol tartrate (LOPRESSOR) 25 MG tablet Take 1 tablet (25 mg total) by mouth 2 (two) times daily. 180 tablet 3   tadalafil (CIALIS) 20 MG tablet Take 1 tablet (20 mg total) by mouth daily as needed for erectile dysfunction. 30 tablet 6   chlorpheniramine-HYDROcodone (TUSSIONEX) 10-8 MG/5ML Take 5 mLs by mouth 2 (two) times daily. (Patient not taking: Reported on 08/10/2022) 70 mL 0   predniSONE (STERAPRED UNI-PAK 21 TAB) 10 MG (21) TBPK tablet 6 day taper; take as directed on package instructions (Patient not taking: Reported on 08/10/2022) 21 tablet 0    promethazine-dextromethorphan (PROMETHAZINE-DM) 6.25-15 MG/5ML syrup Take 5 mLs by mouth 4 (four) times daily as needed. (Patient not taking: Reported on 08/10/2022) 118 mL 0   No facility-administered medications prior to visit.

## 2022-08-10 NOTE — Telephone Encounter (Signed)
Patient has been called and made aware that the message will be sent to Dr. Rockey Situ.
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
## 2022-08-11 ENCOUNTER — Telehealth: Payer: Self-pay | Admitting: Student in an Organized Health Care Education/Training Program

## 2022-08-11 NOTE — Telephone Encounter (Signed)
Patient would like for our office to review his xray 07/08/2020. Patient phone number is [phone].

## 2022-08-12 NOTE — Telephone Encounter (Signed)
Patient missed a call from the nurse.  Please call patient back to discuss at [phone]

## 2022-08-12 NOTE — Telephone Encounter (Signed)
I notified the patient.   Nothing further needed. 
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
## 2022-08-12 NOTE — Telephone Encounter (Signed)
I spoke with the patient. He said he had a C-Spine xray on 06/04/2020 that showed the following, incidental note of carotid artery calcification. He wants to know if you think this and the thrombus in his lower lobe branch of the left pulmonary vein could be related somehow?

## 2022-08-13 ENCOUNTER — Ambulatory Visit: Payer: Medicaid Other | Admitting: Orthopedic Surgery

## 2022-08-14 DIAGNOSIS — Z419 Encounter for procedure for purposes other than remedying health state, unspecified: Secondary | ICD-10-CM | POA: Diagnosis not present

## 2022-08-14 MED ORDER — ROSUVASTATIN CALCIUM 10 MG PO TABS: 10.0000 mg | ORAL_TABLET | Freq: Every day | ORAL | 3 refills | Status: DC
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
## 2022-08-14 NOTE — Telephone Encounter (Signed)
No answer/No voicemail    Minna Merritts, MD  Sent: Fri August 14, 2022  8:09 AM  To: [facility] T[facility]         Message  Images from CT scan reviewed, no significant plaque in the aorta  There is very mild coronary calcification noted  Aggressive management would be to start a statin such as Crestor 10 mg daily  Last cholesterol measured was around 200, we need less than 150  Thx  TGollan  ----- Message ----

## 2022-08-14 NOTE — Telephone Encounter (Signed)
Spoke with patient and reviewed provider review and recommendations. He was agreeable with plan and had no further questions at this time.

## 2022-08-17 ENCOUNTER — Ambulatory Visit (INDEPENDENT_AMBULATORY_CARE_PROVIDER_SITE_OTHER): Payer: Medicaid Other | Admitting: Orthopedic Surgery

## 2022-08-17 ENCOUNTER — Encounter: Payer: Self-pay | Admitting: Orthopedic Surgery

## 2022-08-17 VITALS — BP 140/90 | Ht 72.0 in | Wt 230.2 lb

## 2022-08-17 DIAGNOSIS — M546 Pain in thoracic spine: Secondary | ICD-10-CM

## 2022-08-17 DIAGNOSIS — M4802 Spinal stenosis, cervical region: Secondary | ICD-10-CM

## 2022-08-17 DIAGNOSIS — M5412 Radiculopathy, cervical region: Secondary | ICD-10-CM

## 2022-08-17 DIAGNOSIS — M4722 Other spondylosis with radiculopathy, cervical region: Secondary | ICD-10-CM | POA: Diagnosis not present

## 2022-08-17 DIAGNOSIS — M4726 Other spondylosis with radiculopathy, lumbar region: Secondary | ICD-10-CM

## 2022-08-17 DIAGNOSIS — M47812 Spondylosis without myelopathy or radiculopathy, cervical region: Secondary | ICD-10-CM

## 2022-08-17 DIAGNOSIS — G8929 Other chronic pain: Secondary | ICD-10-CM

## 2022-08-17 DIAGNOSIS — M47816 Spondylosis without myelopathy or radiculopathy, lumbar region: Secondary | ICD-10-CM

## 2022-08-17 DIAGNOSIS — M5416 Radiculopathy, lumbar region: Secondary | ICD-10-CM

## 2022-08-17 NOTE — Patient Instructions (Signed)
It was so nice to see you today. Thank you so much for coming in.   Your MRI from 2022 showed some wear and tear in your spine.   I want to get an MRI of your neck and lower back to look into things further. We will get this approved through your insurance and [facility] will call you to schedule the appointment.   Depending on the results of the MRI, we may consider physical therapy and/or injections. Surgery is generally our last option.   Once I get the results back, we will call to make you a follow up to see me in the office.   Please do not hesitate to call if you have any questions or concerns. You can also message me in [place].   Geronimo Boot PA-C [phone]

## 2022-08-25 ENCOUNTER — Other Ambulatory Visit: Payer: Self-pay

## 2022-08-25 DIAGNOSIS — M5416 Radiculopathy, lumbar region: Secondary | ICD-10-CM

## 2022-08-25 DIAGNOSIS — M5412 Radiculopathy, cervical region: Secondary | ICD-10-CM

## 2022-08-25 NOTE — Progress Notes (Signed)
Order for Physical Therapy has been placed.

## 2022-09-04 ENCOUNTER — Telehealth: Payer: Self-pay | Admitting: Orthopedic Surgery

## 2022-09-04 DIAGNOSIS — M546 Pain in thoracic spine: Secondary | ICD-10-CM

## 2022-09-04 NOTE — Telephone Encounter (Signed)
I spoke with Jake Elliott to inform him that [name] ordered the thoracic MRI. He will call radiology scheduling to see if they can add it to his current appointment.

## 2022-09-04 NOTE — Telephone Encounter (Signed)
Patient called back, he is requesting a MRI thoracic because he is having pain up and down his spine. He is self-pay for his scans.

## 2022-09-04 NOTE — Telephone Encounter (Signed)
I can order thoracic MRI. It looks like he is paying out of pocket. Let him know I don't think they will be able to do it on Monday. May need to reschedule the other ones if he wants them done at same time.

## 2022-09-04 NOTE — Telephone Encounter (Signed)
Patient left a message on office voice mail. He has scheduled his MRI cervical and lumbar on Monday. He would like for you to add a thoracic MRI order also. I tried calling him back to get more information but there was no answer.

## 2022-09-04 NOTE — Telephone Encounter (Signed)
Please see message below

## 2022-09-07 ENCOUNTER — Ambulatory Visit: Admission: RE | Admit: 2022-09-07 | Payer: Medicaid Other | Source: Ambulatory Visit

## 2022-09-07 ENCOUNTER — Ambulatory Visit
Admission: RE | Admit: 2022-09-07 | Discharge: 2022-09-07 | Disposition: A | Discharge status: Home / Self Care | Payer: Medicaid Other | Source: Ambulatory Visit | Attending: Orthopedic Surgery | Admitting: Orthopedic Surgery

## 2022-09-07 ENCOUNTER — Telehealth: Payer: Self-pay | Admitting: Neurosurgery

## 2022-09-07 DIAGNOSIS — M5412 Radiculopathy, cervical region: Secondary | ICD-10-CM | POA: Diagnosis not present

## 2022-09-07 DIAGNOSIS — M47814 Spondylosis without myelopathy or radiculopathy, thoracic region: Secondary | ICD-10-CM | POA: Diagnosis not present

## 2022-09-07 DIAGNOSIS — M546 Pain in thoracic spine: Secondary | ICD-10-CM | POA: Diagnosis not present

## 2022-09-07 DIAGNOSIS — M47816 Spondylosis without myelopathy or radiculopathy, lumbar region: Secondary | ICD-10-CM | POA: Diagnosis not present

## 2022-09-07 DIAGNOSIS — M5416 Radiculopathy, lumbar region: Secondary | ICD-10-CM

## 2022-09-07 DIAGNOSIS — M4802 Spinal stenosis, cervical region: Secondary | ICD-10-CM | POA: Diagnosis not present

## 2022-09-07 DIAGNOSIS — M47812 Spondylosis without myelopathy or radiculopathy, cervical region: Secondary | ICD-10-CM | POA: Diagnosis not present

## 2022-09-07 DIAGNOSIS — M40204 Unspecified kyphosis, thoracic region: Secondary | ICD-10-CM | POA: Diagnosis not present

## 2022-09-07 DIAGNOSIS — M4316 Spondylolisthesis, lumbar region: Secondary | ICD-10-CM | POA: Diagnosis not present

## 2022-09-07 MED ORDER — DIAZEPAM 5 MG PO TABS: ORAL_TABLET | ORAL | 0 refills | Status: DC

## 2022-09-07 NOTE — Telephone Encounter (Signed)
Patient has been notified and verbalized understanding 

## 2022-09-07 NOTE — Telephone Encounter (Signed)
Pt called in asking if he could get a prescription for Xanax as he has 3 MRIs scheduled for this evening and is anxious about them.  If so, please send to [facility] on [address]

## 2022-09-07 NOTE — Telephone Encounter (Signed)
Can do valium to take 30 minutes prior to first MRI, then can repeat right before MRI if needed. This will make him sleepy and he will need a driver.   Please let him know.

## 2022-09-07 NOTE — Telephone Encounter (Signed)
Noted  

## 2022-09-09 ENCOUNTER — Encounter: Payer: Self-pay | Admitting: Orthopedic Surgery

## 2022-09-09 NOTE — Progress Notes (Signed)
He had MRI scans done of cervical, thoracic, and lumbar spine. He will be coming in to see me to review these.   MRIs reviewed with Dr. Izora Ribas. No surgery is recommended for him at this time. He recommends referral to pain management. May also consider referral to [facility] for consideration of cervical stimulator.   Will discuss with him at his follow up.

## 2022-09-10 ENCOUNTER — Telehealth: Payer: Self-pay | Admitting: Student in an Organized Health Care Education/Training Program

## 2022-09-10 NOTE — Telephone Encounter (Signed)
Spoke to patient.  He stated that he had recent CT of back that showed marrow edema and he is currently recovering from bilateral cellulitis of legs.  He is concerned that this may be related to Eliquis. Breathing is baseline.  Dr. Genia Harold, please advise. Thanks

## 2022-09-10 NOTE — Telephone Encounter (Signed)
I notified the patient. 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 He wants to know if you think the cellulitis could be related to the Eliquis?

## 2022-09-11 ENCOUNTER — Ambulatory Visit: Payer: Medicaid Other | Admitting: Orthopedic Surgery

## 2022-09-11 NOTE — Telephone Encounter (Signed)
I notified the patient. Nothing further needed. 

## 2022-09-11 NOTE — Telephone Encounter (Signed)
I tried to contact the patient. I did not get an answer and he did not have a voicemail. I will try again later.

## 2022-09-25 ENCOUNTER — Emergency Department: Payer: Medicaid Other

## 2022-09-25 ENCOUNTER — Telehealth: Payer: Self-pay | Admitting: Student in an Organized Health Care Education/Training Program

## 2022-09-25 ENCOUNTER — Inpatient Hospital Stay
Admission: EM | Admit: 2022-09-25 | Discharge: 2022-09-27 | DRG: 392 | Disposition: A | Discharge status: Home / Self Care | Payer: Medicaid Other | Attending: Family Medicine | Admitting: Family Medicine

## 2022-09-25 ENCOUNTER — Other Ambulatory Visit: Payer: Self-pay

## 2022-09-25 ENCOUNTER — Encounter: Payer: Self-pay | Admitting: Family Medicine

## 2022-09-25 DIAGNOSIS — F17210 Nicotine dependence, cigarettes, uncomplicated: Secondary | ICD-10-CM | POA: Diagnosis present

## 2022-09-25 DIAGNOSIS — Z951 Presence of aortocoronary bypass graft: Secondary | ICD-10-CM

## 2022-09-25 DIAGNOSIS — Z823 Family history of stroke: Secondary | ICD-10-CM

## 2022-09-25 DIAGNOSIS — K648 Other hemorrhoids: Secondary | ICD-10-CM | POA: Diagnosis present

## 2022-09-25 DIAGNOSIS — G909 Disorder of the autonomic nervous system, unspecified: Secondary | ICD-10-CM | POA: Diagnosis present

## 2022-09-25 DIAGNOSIS — G9009 Other idiopathic peripheral autonomic neuropathy: Secondary | ICD-10-CM | POA: Diagnosis present

## 2022-09-25 DIAGNOSIS — G894 Chronic pain syndrome: Secondary | ICD-10-CM | POA: Diagnosis not present

## 2022-09-25 DIAGNOSIS — K529 Noninfective gastroenteritis and colitis, unspecified: Secondary | ICD-10-CM | POA: Diagnosis not present

## 2022-09-25 DIAGNOSIS — Z86711 Personal history of pulmonary embolism: Secondary | ICD-10-CM

## 2022-09-25 DIAGNOSIS — F4310 Post-traumatic stress disorder, unspecified: Secondary | ICD-10-CM | POA: Diagnosis not present

## 2022-09-25 DIAGNOSIS — I421 Obstructive hypertrophic cardiomyopathy: Secondary | ICD-10-CM | POA: Diagnosis present

## 2022-09-25 DIAGNOSIS — M542 Cervicalgia: Secondary | ICD-10-CM | POA: Diagnosis present

## 2022-09-25 DIAGNOSIS — Z79899 Other long term (current) drug therapy: Secondary | ICD-10-CM

## 2022-09-25 DIAGNOSIS — A09 Infectious gastroenteritis and colitis, unspecified: Secondary | ICD-10-CM | POA: Diagnosis not present

## 2022-09-25 DIAGNOSIS — F17200 Nicotine dependence, unspecified, uncomplicated: Secondary | ICD-10-CM | POA: Diagnosis present

## 2022-09-25 DIAGNOSIS — M545 Low back pain, unspecified: Secondary | ICD-10-CM | POA: Diagnosis not present

## 2022-09-25 DIAGNOSIS — I10 Essential (primary) hypertension: Secondary | ICD-10-CM | POA: Diagnosis not present

## 2022-09-25 DIAGNOSIS — K269 Duodenal ulcer, unspecified as acute or chronic, without hemorrhage or perforation: Secondary | ICD-10-CM | POA: Diagnosis present

## 2022-09-25 DIAGNOSIS — F900 Attention-deficit hyperactivity disorder, predominantly inattentive type: Secondary | ICD-10-CM | POA: Diagnosis not present

## 2022-09-25 DIAGNOSIS — K5730 Diverticulosis of large intestine without perforation or abscess without bleeding: Secondary | ICD-10-CM | POA: Diagnosis present

## 2022-09-25 DIAGNOSIS — M549 Dorsalgia, unspecified: Secondary | ICD-10-CM | POA: Diagnosis present

## 2022-09-25 DIAGNOSIS — Z6828 Body mass index (BMI) 28.0-28.9, adult: Secondary | ICD-10-CM

## 2022-09-25 DIAGNOSIS — F419 Anxiety disorder, unspecified: Secondary | ICD-10-CM | POA: Diagnosis present

## 2022-09-25 DIAGNOSIS — E785 Hyperlipidemia, unspecified: Secondary | ICD-10-CM | POA: Diagnosis present

## 2022-09-25 DIAGNOSIS — Z885 Allergy status to narcotic agent status: Secondary | ICD-10-CM

## 2022-09-25 DIAGNOSIS — K625 Hemorrhage of anus and rectum: Secondary | ICD-10-CM | POA: Diagnosis present

## 2022-09-25 DIAGNOSIS — M4726 Other spondylosis with radiculopathy, lumbar region: Secondary | ICD-10-CM | POA: Diagnosis not present

## 2022-09-25 DIAGNOSIS — K219 Gastro-esophageal reflux disease without esophagitis: Secondary | ICD-10-CM | POA: Diagnosis not present

## 2022-09-25 DIAGNOSIS — Z808 Family history of malignant neoplasm of other organs or systems: Secondary | ICD-10-CM

## 2022-09-25 DIAGNOSIS — Z888 Allergy status to other drugs, medicaments and biological substances status: Secondary | ICD-10-CM

## 2022-09-25 DIAGNOSIS — G8929 Other chronic pain: Secondary | ICD-10-CM | POA: Diagnosis not present

## 2022-09-25 DIAGNOSIS — G4733 Obstructive sleep apnea (adult) (pediatric): Secondary | ICD-10-CM | POA: Diagnosis not present

## 2022-09-25 DIAGNOSIS — G629 Polyneuropathy, unspecified: Secondary | ICD-10-CM | POA: Diagnosis present

## 2022-09-25 DIAGNOSIS — M5030 Other cervical disc degeneration, unspecified cervical region: Secondary | ICD-10-CM | POA: Diagnosis not present

## 2022-09-25 DIAGNOSIS — M4722 Other spondylosis with radiculopathy, cervical region: Secondary | ICD-10-CM | POA: Diagnosis not present

## 2022-09-25 DIAGNOSIS — I700 Atherosclerosis of aorta: Secondary | ICD-10-CM | POA: Diagnosis not present

## 2022-09-25 DIAGNOSIS — E669 Obesity, unspecified: Secondary | ICD-10-CM | POA: Diagnosis present

## 2022-09-25 DIAGNOSIS — Z7901 Long term (current) use of anticoagulants: Secondary | ICD-10-CM | POA: Diagnosis not present

## 2022-09-25 DIAGNOSIS — Z886 Allergy status to analgesic agent status: Secondary | ICD-10-CM

## 2022-09-25 DIAGNOSIS — F319 Bipolar disorder, unspecified: Secondary | ICD-10-CM | POA: Diagnosis present

## 2022-09-25 DIAGNOSIS — M4724 Other spondylosis with radiculopathy, thoracic region: Secondary | ICD-10-CM | POA: Diagnosis not present

## 2022-09-25 DIAGNOSIS — Z8249 Family history of ischemic heart disease and other diseases of the circulatory system: Secondary | ICD-10-CM

## 2022-09-25 LAB — C DIFFICILE QUICK SCREEN W PCR REFLEX
C Diff antigen: NEGATIVE
C Diff interpretation: NOT DETECTED
C Diff toxin: NEGATIVE

## 2022-09-25 LAB — TYPE AND SCREEN
ABO/RH(D): A NEG
Antibody Screen: NEGATIVE

## 2022-09-25 LAB — URINALYSIS, ROUTINE W REFLEX MICROSCOPIC
Bilirubin Urine: NEGATIVE
Glucose, UA: NEGATIVE mg/dL
Hgb urine dipstick: NEGATIVE
Ketones, ur: NEGATIVE mg/dL
Leukocytes,Ua: NEGATIVE
Nitrite: NEGATIVE
Protein, ur: NEGATIVE mg/dL
Specific Gravity, Urine: >1.046 — ABNORMAL HIGH (ref 1.005–1.030)
pH: 6 (ref 5.0–8.0)

## 2022-09-25 LAB — COMPREHENSIVE METABOLIC PANEL
ALT: 23 U/L (ref 0–44)
AST: 30 U/L (ref 15–41)
Albumin: 4.3 g/dL (ref 3.5–5.0)
Alkaline Phosphatase: 79 U/L (ref 38–126)
Anion gap: 14 (ref 5–15)
BUN: 15 mg/dL (ref 6–20)
CO2: 18 mmol/L — ABNORMAL LOW (ref 22–32)
Calcium: 9.6 mg/dL (ref 8.9–10.3)
Chloride: 104 mmol/L (ref 98–111)
Creatinine, Ser: 0.84 mg/dL (ref 0.61–1.24)
GFR, Estimated: >60 mL/min (ref >60)
Glucose, Bld: 135 mg/dL — ABNORMAL HIGH (ref 70–99)
Potassium: 4.3 mmol/L (ref 3.5–5.1)
Sodium: 136 mmol/L (ref 135–145)
Total Bilirubin: 0.8 mg/dL (ref 0.3–1.2)
Total Protein: 7.6 g/dL (ref 6.5–8.1)

## 2022-09-25 LAB — CBC
HCT: 51.7 % (ref 39.0–52.0)
Hemoglobin: 17.9 g/dL — ABNORMAL HIGH (ref 13.0–17.0)
MCH: 30 pg (ref 26.0–34.0)
MCHC: 34.6 g/dL (ref 30.0–36.0)
MCV: 86.6 fL (ref 80.0–100.0)
Platelets: 250 10*3/uL (ref 150–400)
RBC: 5.97 MIL/uL — ABNORMAL HIGH (ref 4.22–5.81)
RDW: 12.1 % (ref 11.5–15.5)
WBC: 13 10*3/uL — ABNORMAL HIGH (ref 4.0–10.5)
nRBC: 0 % (ref 0.0–0.2)

## 2022-09-25 LAB — LACTIC ACID, PLASMA: Lactic Acid, Venous: 1.8 mmol/L (ref 0.5–1.9)

## 2022-09-25 LAB — PROTIME-INR
INR: 1 (ref 0.8–1.2)
Prothrombin Time: 13 seconds (ref 11.4–15.2)

## 2022-09-25 MED ORDER — METRONIDAZOLE 500 MG/100ML IV SOLN
500.0000 mg | Freq: Once | INTRAVENOUS | Status: AC
  Administered 2022-09-25: 500 mg via INTRAVENOUS
  Filled 2022-09-25: qty 100

## 2022-09-25 MED ORDER — ACETAMINOPHEN 650 MG RE SUPP: 650.0000 mg | Freq: Four times a day (QID) | RECTAL | Status: DC | PRN

## 2022-09-25 MED ORDER — SODIUM CHLORIDE 0.9 % IV SOLN
2.0000 g | Freq: Once | INTRAVENOUS | Status: AC
  Administered 2022-09-25: 2 g via INTRAVENOUS
  Filled 2022-09-25: qty 20

## 2022-09-25 MED ORDER — IOHEXOL 350 MG/ML SOLN
100.0000 mL | Freq: Once | INTRAVENOUS | Status: AC | PRN
  Administered 2022-09-25: 100 mL via INTRAVENOUS

## 2022-09-25 MED ORDER — ONDANSETRON HCL 4 MG/2ML IJ SOLN
4.0000 mg | Freq: Once | INTRAMUSCULAR | Status: AC
  Administered 2022-09-25: 4 mg via INTRAVENOUS
  Filled 2022-09-25: qty 2

## 2022-09-25 MED ORDER — MORPHINE SULFATE (PF) 2 MG/ML IV SOLN
2.0000 mg | INTRAVENOUS | Status: DC | PRN
  Administered 2022-09-25: 2 mg via INTRAVENOUS
  Filled 2022-09-25: qty 1

## 2022-09-25 MED ORDER — METRONIDAZOLE 500 MG/100ML IV SOLN
500.0000 mg | Freq: Two times a day (BID) | INTRAVENOUS | Status: DC
  Administered 2022-09-26 – 2022-09-27 (×3): 500 mg via INTRAVENOUS
  Filled 2022-09-25 (×3): qty 100

## 2022-09-25 MED ORDER — OXYCODONE HCL 5 MG PO TABS: 5.0000 mg | ORAL_TABLET | ORAL | Status: DC | PRN

## 2022-09-25 MED ORDER — ONDANSETRON HCL 4 MG PO TABS: 4.0000 mg | ORAL_TABLET | Freq: Four times a day (QID) | ORAL | Status: DC | PRN

## 2022-09-25 MED ORDER — ONDANSETRON HCL 4 MG/2ML IJ SOLN
4.0000 mg | Freq: Four times a day (QID) | INTRAMUSCULAR | Status: DC | PRN
  Administered 2022-09-26: 4 mg via INTRAVENOUS
  Filled 2022-09-25: qty 2

## 2022-09-25 MED ORDER — CIPROFLOXACIN IN D5W 400 MG/200ML IV SOLN
400.0000 mg | Freq: Two times a day (BID) | INTRAVENOUS | Status: DC
  Administered 2022-09-25 – 2022-09-27 (×4): 400 mg via INTRAVENOUS
  Filled 2022-09-25 (×4): qty 200

## 2022-09-25 MED ORDER — APIXABAN 5 MG PO TABS
5.0000 mg | ORAL_TABLET | Freq: Two times a day (BID) | ORAL | Status: DC
  Administered 2022-09-25: 5 mg via ORAL
  Filled 2022-09-25 (×2): qty 1

## 2022-09-25 MED ORDER — HYDROMORPHONE HCL 1 MG/ML IJ SOLN
0.5000 mg | Freq: Once | INTRAMUSCULAR | Status: AC
  Administered 2022-09-25: 0.5 mg via INTRAVENOUS
  Filled 2022-09-25: qty 0.5

## 2022-09-25 MED ORDER — ACETAMINOPHEN 325 MG PO TABS
650.0000 mg | ORAL_TABLET | Freq: Four times a day (QID) | ORAL | Status: DC | PRN
  Administered 2022-09-26 (×2): 650 mg via ORAL
  Filled 2022-09-25 (×2): qty 2

## 2022-09-25 MED ORDER — ALBUTEROL SULFATE (2.5 MG/3ML) 0.083% IN NEBU: 3.0000 mL | INHALATION_SOLUTION | Freq: Four times a day (QID) | RESPIRATORY_TRACT | Status: DC | PRN

## 2022-09-25 MED ORDER — METOPROLOL TARTRATE 25 MG PO TABS
25.0000 mg | ORAL_TABLET | Freq: Two times a day (BID) | ORAL | Status: DC
  Administered 2022-09-25 – 2022-09-27 (×4): 25 mg via ORAL
  Filled 2022-09-25 (×4): qty 1

## 2022-09-25 MED ORDER — HYDROMORPHONE HCL 1 MG/ML IJ SOLN
1.0000 mg | INTRAMUSCULAR | Status: DC | PRN
  Administered 2022-09-25 – 2022-09-27 (×13): 1 mg via INTRAVENOUS
  Filled 2022-09-25 (×13): qty 1

## 2022-09-25 MED ORDER — SODIUM CHLORIDE 0.9 % IV SOLN: Freq: Once | INTRAVENOUS | Status: DC

## 2022-09-25 MED ORDER — DOCUSATE SODIUM 100 MG PO CAPS
100.0000 mg | ORAL_CAPSULE | Freq: Two times a day (BID) | ORAL | Status: DC
  Administered 2022-09-27: 100 mg via ORAL
  Filled 2022-09-25 (×2): qty 1

## 2022-09-25 MED ORDER — SODIUM CHLORIDE 0.9 % IV BOLUS
1000.0000 mL | Freq: Once | INTRAVENOUS | Status: AC
  Administered 2022-09-25: 1000 mL via INTRAVENOUS

## 2022-09-25 MED ORDER — MORPHINE SULFATE (PF) 4 MG/ML IV SOLN
4.0000 mg | Freq: Once | INTRAVENOUS | Status: AC
  Administered 2022-09-25: 4 mg via INTRAVENOUS
  Filled 2022-09-25: qty 1

## 2022-09-25 MED ORDER — ROSUVASTATIN CALCIUM 10 MG PO TABS
10.0000 mg | ORAL_TABLET | Freq: Every day | ORAL | Status: DC
  Administered 2022-09-26: 10 mg via ORAL
  Filled 2022-09-25 (×3): qty 1

## 2022-09-25 MED ORDER — SODIUM CHLORIDE 0.9 % IV SOLN: INTRAVENOUS | Status: DC

## 2022-09-25 MED ORDER — MELATONIN 5 MG PO TABS
10.0000 mg | ORAL_TABLET | Freq: Every day | ORAL | Status: DC
  Administered 2022-09-25 – 2022-09-26 (×2): 10 mg via ORAL
  Filled 2022-09-25 (×2): qty 2

## 2022-09-25 MED ORDER — HYDROMORPHONE HCL 1 MG/ML IJ SOLN
1.0000 mg | Freq: Once | INTRAMUSCULAR | Status: AC
  Administered 2022-09-25: 1 mg via INTRAVENOUS
  Filled 2022-09-25: qty 1

## 2022-09-25 NOTE — ED Triage Notes (Signed)
Pt sts that he has been having rectal bleeding. Pt sts that he takes eliquis. Pt sts that he is having lower mid abd pain.

## 2022-09-25 NOTE — Telephone Encounter (Signed)
Sending to Dr. Aundria Rud as an [place] Childes

## 2022-09-25 NOTE — ED Notes (Signed)
Family at bedside. 

## 2022-09-25 NOTE — H&P (Addendum)
History and Physical    Jake Elliott ZOX:[id number] DOB: 1970-12-18 DOA: 09/25/2022  PCP: Patient, No Pcp Per   Patient coming from: Home  I have personally briefly reviewed patient's old medical records in [facility]  Chief Complaint: Abdominal pain associated with blood in the stools.  HPI: Jake Elliott is a 52 y.o. male with PMH significant for attention deficit hyperactivity disorder, bipolar disorder, depression, hypertension, hypertrophic cardiomyopathy, PTSD, history of PE on Eliquis presented in the ED with complaints of abdominal pain associated with blood in the stools.  Patient reports he was admitted last month for blood clot and was started on antibiotics and was given Eliquis at discharge.  Patient has completed the antibiotics course and continued to take Eliquis.  He has noticed blood in the stools last night followed by abdominal pain.  He describes abdominal pain as severe, located in the epigastric region radiating on both sides of abdomen.  He noticed bright red blood in the stools every time he goes to the bathroom so far he has bowel movement 3-4 times and every time he noticed blood mixed with the stool.  He denies any recent travel, sick contacts,.  Denies any nausea, vomiting, chest pain, shortness of breath.  ED Course: He was hemodynamically stable except tachycardia and hypertension, heart rate 102, BP 150/95, RR 16, temp 97.8, RR 16 Labs include sodium 136, potassium 4.3, chloride 104, bicarb 18, glucose 135, BUN 15, creatinine 0.84, calcium 9.6, anion gap 14, alkaline phosphatase 79, albumin 4.3, AST 30, ALT 23, total protein 7.6, total bilirubin 0.8, lactic acid 1.8, WBC 13.0, hemoglobin 17.9, hematocrit 51.7, MCV 86.6, platelet 250, UA unremarkable, stool for C. difficile pending CT angio no evidence of aneurysm dissection or other aortic pathology.  Wall thickening and mucosal hyperenhancement of descending colon finding consistent with non specific inflammatory  or ischemic colitis.  Sigmoid diverticulosis without acute diverticulitis.  Review of Systems:  Review of Systems  Constitutional: Negative.   HENT: Negative.    Eyes: Negative.   Respiratory: Negative.    Cardiovascular: Negative.   Gastrointestinal:  Positive for abdominal pain and blood in stool.  Genitourinary: Negative.   Musculoskeletal: Negative.   Skin: Negative.   Neurological: Negative.   Endo/Heme/Allergies: Negative.   Psychiatric/Behavioral:  Positive for depression and substance abuse. The patient is nervous/anxious.     Past Medical History:  Diagnosis Date   Angina at rest    Anxiety    Attention-deficit/hyperactivity disorder    Bipolar disorder (HCC)    Depression    History of tobacco abuse    Hypertension    Hypertrophic cardiomyopathy (HCC)    s/p myomectomy   Peripheral neuropathy    PTSD (post-traumatic stress disorder)     Past Surgical History:  Procedure Laterality Date   CARDIAC CATHETERIZATION     11/14/2010   CORONARY ARTERY BYPASS GRAFT     for myomectomy for hypertrophic cardiomyopathy     reports that he has been smoking cigarettes. He started smoking about 33 years ago. He has a 15.00 pack-year smoking history. He has never used smokeless tobacco. He reports that he does not drink alcohol and does not use drugs.  Allergies  Allergen Reactions   Acetaminophen Other (See Comments)    headache   Bystolic [Nebivolol Hcl]     Headaches   Codeine     Nausea & headache Other reaction(s): HEADACHE   Morphine And Related     Nausea & headache.    Family  History  Problem Relation Age of Onset   Cancer - Lung Mother    Skin cancer Mother    Alcohol abuse Mother    Drug abuse Mother    Stroke Father    Hypertension Father    Alcohol abuse Father    Drug abuse Father    Drug abuse Sister    Family history reviewed and not pertinent.  Prior to Admission medications   Medication Sig Start Date End Date Taking? Authorizing Provider   rosuvastatin (CRESTOR) 10 MG tablet Take 1 tablet (10 mg total) by mouth daily. 08/14/22 11/12/22  Antonieta Iba, MD  albuterol (VENTOLIN HFA) 108 (90 Base) MCG/ACT inhaler Inhale 2 puffs into the lungs every 6 (six) hours as needed for wheezing or shortness of breath. 07/06/22   Orvil Feil, PA-C  apixaban (ELIQUIS) 5 MG TABS tablet Take 1 tablet (5 mg total) by mouth 2 (two) times daily. 08/10/22 11/08/22  [name], MD  Aspirin-Caffeine (BC FAST PAIN RELIEF PO) Take by mouth as needed.    [provider]  diazepam (VALIUM) 5 MG tablet 1 po 30 minutes prior to MRI scan. May repeat x 1 right before MRI scan if needed. You will need a driver. 09/07/22   Drake Leach, PA-C  diphenhydrAMINE (BENADRYL) 25 MG tablet Take 3 tablets (75 mg total) by mouth at bedtime as needed. 01/11/17   Brandy Hale, MD  Melatonin 10 MG CAPS Take 10 mg by mouth at bedtime. 01/11/17   Brandy Hale, MD  metoprolol tartrate (LOPRESSOR) 25 MG tablet Take 1 tablet (25 mg total) by mouth 2 (two) times daily. 04/17/22   Antonieta Iba, MD  omeprazole (PRILOSEC OTC) 20 MG tablet Take 1 tablet (20 mg total) by mouth daily. 08/10/22 08/10/23  [name], MD  promethazine-dextromethorphan (PROMETHAZINE-DM) 6.25-15 MG/5ML syrup Take 5 mLs by mouth 4 (four) times daily as needed. Patient not taking: Reported on 08/10/2022 06/30/22   Margaretann Loveless, PA-C  tadalafil (CIALIS) 20 MG tablet Take 1 tablet (20 mg total) by mouth daily as needed for erectile dysfunction. 04/17/22   Antonieta Iba, MD    Physical Exam: Vitals:   09/25/22 1458 09/25/22 1720  BP: (!) 150/99 (!) 150/95  Pulse: (!) 102   Resp: 18   Temp: 97.6 F (36.4 C)   TempSrc: Oral   SpO2: 100%   Weight: 102.1 kg     Constitutional: Appears comfortable, not in any acute distress. Vitals:   09/25/22 1458 09/25/22 1720  BP: (!) 150/99 (!) 150/95  Pulse: (!) 102   Resp: 18   Temp: 97.6 F (36.4 C)   TempSrc: Oral   SpO2: 100%    Weight: 102.1 kg    Eyes: PERRL, lids and conjunctivae normal ENMT: Mucous membranes are moist.  Posterior pharynx without exudate. Neck: normal, supple, no masses, no thyromegaly Respiratory: Clear bilaterally, respiratory effort normal, RR 15 Cardiovascular: S1-S2 heard, regular rate and rhythm, no murmur Abdomen: Abdomen is soft, mildly distended, generalized tenderness noted, BS+ Musculoskeletal: no clubbing / cyanosis.  Good range of motion, no edema, normal muscle tone.  Skin: no rashes, lesions, ulcers. No induration Neurologic: CN 2-12 grossly intact. Sensation intact, DTR normal. Strength 5/5 in all 4.  Psychiatric: Normal judgment and insight. Alert and oriented x 3. Normal mood.    Labs on Admission: I have personally reviewed following labs and imaging studies  CBC: Recent Labs  Lab 09/25/22 1500  WBC 13.0*  HGB 17.9*  HCT  51.7  MCV 86.6  PLT 250   Basic Metabolic Panel: Recent Labs  Lab 09/25/22 1500  NA 136  K 4.3  CL 104  CO2 18*  GLUCOSE 135*  BUN 15  CREATININE 0.84  CALCIUM 9.6   GFR: Estimated Creatinine Clearance: 131.5 mL/min (by C-G formula based on SCr of 0.84 mg/dL). Liver Function Tests: Recent Labs  Lab 09/25/22 1500  AST 30  ALT 23  ALKPHOS 79  BILITOT 0.8  PROT 7.6  ALBUMIN 4.3   No results for input(s): "LIPASE", "AMYLASE" in the last 168 hours. No results for input(s): "AMMONIA" in the last 168 hours. Coagulation Profile: Recent Labs  Lab 09/25/22 1500  INR 1.0   Cardiac Enzymes: No results for input(s): "CKTOTAL", "CKMB", "CKMBINDEX", "TROPONINI" in the last 168 hours. BNP (last 3 results) No results for input(s): "PROBNP" in the last 8760 hours. HbA1C: No results for input(s): "HGBA1C" in the last 72 hours. CBG: No results for input(s): "GLUCAP" in the last 168 hours. Lipid Profile: No results for input(s): "CHOL", "HDL", "LDLCALC", "TRIG", "CHOLHDL", "LDLDIRECT" in the last 72 hours. Thyroid Function Tests: No  results for input(s): "TSH", "T4TOTAL", "FREET4", "T3FREE", "THYROIDAB" in the last 72 hours. Anemia Panel: No results for input(s): "VITAMINB12", "FOLATE", "FERRITIN", "TIBC", "IRON", "RETICCTPCT" in the last 72 hours. Urine analysis:    Component Value Date/Time   COLORURINE YELLOW (A) 09/25/2022 1721   APPEARANCEUR CLEAR (A) 09/25/2022 1721   APPEARANCEUR Clear 01/23/2013 1235   LABSPEC >1.046 (H) 09/25/2022 1721   LABSPEC 1.010 01/23/2013 1235   PHURINE 6.0 09/25/2022 1721   GLUCOSEU NEGATIVE 09/25/2022 1721   GLUCOSEU Negative 01/23/2013 1235   HGBUR NEGATIVE 09/25/2022 1721   BILIRUBINUR NEGATIVE 09/25/2022 1721   BILIRUBINUR Negative 01/23/2013 1235   KETONESUR NEGATIVE 09/25/2022 1721   PROTEINUR NEGATIVE 09/25/2022 1721   NITRITE NEGATIVE 09/25/2022 1721   LEUKOCYTESUR NEGATIVE 09/25/2022 1721   LEUKOCYTESUR Negative 01/23/2013 1235    Radiological Exams on Admission: CT ANGIO GI BLEED  Result Date: 09/25/2022 CLINICAL DATA:  Mesenteric ischemia EXAM: CTA ABDOMEN AND PELVIS WITHOUT AND WITH CONTRAST TECHNIQUE: Multidetector CT imaging of the abdomen and pelvis was performed using the standard protocol during bolus administration of intravenous contrast. Multiplanar reconstructed images and MIPs were obtained and reviewed to evaluate the vascular anatomy. RADIATION DOSE REDUCTION: This exam was performed according to the departmental dose-optimization program which includes automated exposure control, adjustment of the mA and/or kV according to patient size and/or use of iterative reconstruction technique. CONTRAST:  OMNIPAQUE IOHEXOL 350 MG/ML SOLN COMPARISON:  CT chest angiogram, 07/06/2022 FINDINGS: VASCULAR Normal contour and caliber of the abdominal aorta. No evidence of aneurysm, dissection, or other acute aortic pathology. Standard branching pattern of the abdominal aorta, with solitary bilateral renal arteries. Moderate mixed calcific atherosclerosis. The branch  vessel origins are widely patent, including the superior mesenteric artery and its proximal order branches. Review of the MIP images confirms the above findings. NON-VASCULAR Lower chest: No acute abnormality. Hepatobiliary: No solid liver abnormality is seen. No gallstones, gallbladder wall thickening, or biliary dilatation. Pancreas: Unremarkable. No pancreatic ductal dilatation or surrounding inflammatory changes. Spleen: Normal in size without significant abnormality. Adrenals/Urinary Tract: Adrenal glands are unremarkable. Kidneys are normal, without renal calculi, solid lesion, or hydronephrosis. Bladder is unremarkable. Stomach/Bowel: Stomach is within normal limits. Appendix appears normal. Long segment wall thickening mucosal hyperenhancement of the descending colon, with focal fat stranding about the mid descending colon (series 9, image 87). Sigmoid diverticulosis. Lymphatic: No  enlarged abdominal or pelvic lymph nodes. Reproductive: No mass or other significant abnormality. Other: No abdominal wall hernia or abnormality. No ascites. Musculoskeletal: No acute or significant osseous findings. IMPRESSION: 1. Normal contour and caliber of the abdominal aorta. No evidence of aneurysm, dissection, or other acute aortic pathology. Moderate mixed calcific atherosclerosis. The branch vessel origins are widely patent, including the superior mesenteric artery and its proximal order branches. 2. Long segment wall thickening and mucosal hyperenhancement of the descending colon, with focal fat stranding about the mid descending colon. Findings are consistent with nonspecific infectious, inflammatory, or ischemic colitis. 3. Sigmoid diverticulosis without evidence of acute diverticulitis. Note that diverticulosis of the sigmoid colon does not appear to be the nidus of inflammation described above. Aortic Atherosclerosis (ICD10-I70.0). Electronically Signed   By: Jearld Lesch M.D.   On: 09/25/2022 17:08    EKG: EKG  ordered please review it.  Assessment/Plan Principal Problem:   Infectious colitis, enteritis and gastroenteritis Active Problems:   HOCM (hypertrophic obstructive cardiomyopathy)   OSA (obstructive sleep apnea)   Essential hypertension   Peripheral autonomic neuropathy of unknown cause   Affective bipolar disorder   Chronic pain syndrome   Attention deficit hyperactivity disorder (ADHD), predominantly inattentive type   Tobacco use disorder   Infectious colitis: Patient presented with generalized abdominal pain associated with bright red blood per rectum. CT A/P shows finding consistent with infectious versus ischemic versus colitis. Started on empiric antibiotics  ciprofloxacin and Flagyl. Continue IV fluid resuscitation. Patient has completed course of antibiotic a month ago.  Obtain stool for C. Difficile and GI panel. Continue enteric precautions until C. difficile and GI panel is back. Continue adequate pain control. H&H remains stable.  Continue to monitor H&H.  Bright red blood per rectum: This could be secondary to CT for hemorrhoids. H&H remains stable. If H&H drops further,  can consider GI eval.  History of pulmonary embolism: Continue Eliquis since recent PE.[name]  Monitor H&H  History of hypertrophic cardiomyopathy: Continue metoprolol.  GERD Continue pantoprazole 40 mg daily  Obesity: Diet and exercise discussed in detail  Hyperlipidemia Continue Crestor 20 mg daily.  Hypertension: Continue metoprolol.  Obstructive sleep apnea: CPAP at night.  Tobacco abuse: Smoking cessation counseling completed.   DVT prophylaxis: SCDs Code Status: Full code Family Communication: No family at bedside Disposition Plan: Admitted for abdominal pain associated with rectal bleeding,  Consults called: None Admission status: Telemetry inpatient   Willeen Niece MD [facility]   If 7PM-7AM, please contact night-coverage   09/25/2022, 6:15 PM

## 2022-09-25 NOTE — Telephone Encounter (Signed)
Noted. Patient currently at [facility] per epic.

## 2022-09-25 NOTE — Telephone Encounter (Signed)
Received verbal from patient to speak to girlfriend, Jake Elliott.  Patient currently taking Eliquis 5mg  BID  Jake Elliott states that patient developed a severe nose bleed two days ago. Nose bleed subsided.  Today patient started vomiting blood and having loose bloody stools. Patient is experiencing severe abdominal pain.  I spoke with Dr. Jayme Cloud verbally, who recommended ED.  Both Samantha and patient are aware of recommendations and voiced their understanding.  Nothing further needed.

## 2022-09-25 NOTE — ED Provider Notes (Signed)
[facility] Provider Note    Event Date/Time   First MD Initiated Contact with Patient 09/25/22 1605     (approximate)   History   GI Bleeding   HPI  Ikenna Ohms is a 52 y.o. male with history of PE on Eliquis here with abdominal pain and rectal bleeding.  The patient states that symptoms started yesterday with a fairly cute onset of severe, cramping, primarily lower abdominal pain.  He felt like he needed to have a bowel movement and had a fairly large bowel movement since then, he has had persistent abdominal cramping as well as grossly bloody stool.  He states the stools were mixed in with "debris" and was initially mixed with stool but no grossly bloody.  Denies history of diverticulitis or colitis.  He has been taking his medications as prescribed.  Denies any suspicious food intake.  No fevers or chills.  No known sick contacts.     Physical Exam   Triage Vital Signs: ED Triage Vitals [09/25/22 1458]  Enc Vitals Group     BP (!) 150/99     Pulse Rate (!) 102     Resp 18     Temp 97.6 F (36.4 C)     Temp Source Oral     SpO2 100 %     Weight 225 lb (102.1 kg)     Height      Head Circumference      Peak Flow      Pain Score 10     Pain Loc      Pain Edu?      Excl. in GC?     Most recent vital signs: Vitals:   09/25/22 1720 09/25/22 2031  BP: (!) 150/95 (!) 159/97  Pulse:  72  Resp:  18  Temp:  (!) 97.4 F (36.3 C)  SpO2:  100%     General: Awake, no distress.  Appears uncomfortable. CV:  Good peripheral perfusion.  Mild tachycardia. Resp:  Normal work of breathing.  Abd:  No distention.  Diffuse tenderness with guarding, primarily worse in the lower quadrants bilaterally. Other:  No edema.   ED Results / Procedures / Treatments   Labs (all labs ordered are listed, but only abnormal results are displayed) Labs Reviewed  COMPREHENSIVE METABOLIC PANEL - Abnormal; Notable for the following components:      Result Value    CO2 18 (*)    Glucose, Bld 135 (*)    All other components within normal limits  CBC - Abnormal; Notable for the following components:   WBC 13.0 (*)    RBC 5.97 (*)    Hemoglobin 17.9 (*)    All other components within normal limits  URINALYSIS, ROUTINE W REFLEX MICROSCOPIC - Abnormal; Notable for the following components:   Color, Urine YELLOW (*)    APPearance CLEAR (*)    Specific Gravity, Urine >1.046 (*)    All other components within normal limits  C DIFFICILE QUICK SCREEN W PCR REFLEX    GASTROINTESTINAL PANEL BY PCR, STOOL (REPLACES STOOL CULTURE)  PROTIME-INR  LACTIC ACID, PLASMA  COMPREHENSIVE METABOLIC PANEL  CBC  MAGNESIUM  PHOSPHORUS  HIV ANTIBODY (ROUTINE TESTING W REFLEX)  POC OCCULT BLOOD, ED  TYPE AND SCREEN  TYPE AND SCREEN     EKG    RADIOLOGY CT angio: long segment of colitis in descending colon, no abscess   I also independently reviewed and agree with radiologist interpretations.   PROCEDURES:  Critical Care performed: No   MEDICATIONS ORDERED IN ED: Medications  metoprolol tartrate (LOPRESSOR) tablet 25 mg (25 mg Oral Given 09/25/22 2115)  rosuvastatin (CRESTOR) tablet 10 mg (10 mg Oral Patient Refused/Not Given 09/25/22 2120)  melatonin tablet 10 mg (10 mg Oral Given 09/25/22 2115)  albuterol (PROVENTIL) (2.5 MG/3ML) 0.083% nebulizer solution 3 mL (has no administration in time range)  0.9 %  sodium chloride infusion ( Intravenous New Bag/Given 09/25/22 1752)  docusate sodium (COLACE) capsule 100 mg (100 mg Oral Patient Refused/Not Given 09/25/22 2120)  ondansetron (ZOFRAN) tablet 4 mg (has no administration in time range)    Or  ondansetron (ZOFRAN) injection 4 mg (has no administration in time range)  acetaminophen (TYLENOL) tablet 650 mg (has no administration in time range)    Or  acetaminophen (TYLENOL) suppository 650 mg (has no administration in time range)  metroNIDAZOLE (FLAGYL) IVPB 500 mg (has no administration in time range)   ciprofloxacin (CIPRO) IVPB 400 mg (400 mg Intravenous New Bag/Given 09/25/22 2113)  apixaban (ELIQUIS) tablet 5 mg (5 mg Oral Given 09/25/22 2114)  HYDROmorphone (DILAUDID) injection 1 mg (1 mg Intravenous Given 09/26/22 0032)  morphine (PF) 4 MG/ML injection 4 mg (4 mg Intravenous Given 09/25/22 1532)  ondansetron (ZOFRAN) injection 4 mg (4 mg Intravenous Given 09/25/22 1532)  HYDROmorphone (DILAUDID) injection 1 mg (1 mg Intravenous Given 09/25/22 1632)  sodium chloride 0.9 % bolus 1,000 mL (0 mLs Intravenous Stopped 09/25/22 1753)  iohexol (OMNIPAQUE) 350 MG/ML injection 100 mL (100 mLs Intravenous Contrast Given 09/25/22 1636)  cefTRIAXone (ROCEPHIN) 2 g in sodium chloride 0.9 % 100 mL IVPB (0 g Intravenous Stopped 09/25/22 1755)  metroNIDAZOLE (FLAGYL) IVPB 500 mg (0 mg Intravenous Stopped 09/25/22 1846)  HYDROmorphone (DILAUDID) injection 0.5 mg (0.5 mg Intravenous Given 09/25/22 1729)     IMPRESSION / MDM / ASSESSMENT AND PLAN / ED COURSE  I reviewed the triage vital signs and the nursing notes.                              Differential diagnosis includes, but is not limited to, diverticulitis, colitis, mesenteric ischemia, obstruction, enteritis, appendicitis, cholecystitis  Patient's presentation is most consistent with acute presentation with potential threat to life or bodily function.  The patient is on the cardiac monitor to evaluate for evidence of arrhythmia and/or significant heart rate changes   52 yo M with PMHx PE on anticoagulation here with abdominal pain and BRBPR. Hgb is stable at 17.9 (slightly hemoconcentrated). WBC 13k. CT scan obtained and reviewed as above, shows colitis without abscess. Given his ongoing bleeding on anticoagulation, will admit for observation and lab trending.   FINAL CLINICAL IMPRESSION(S) / ED DIAGNOSES   Final diagnoses:  Colitis     Rx / DC Orders   ED Discharge Orders     None        Note:  This document was prepared using Dragon  voice recognition software and may include unintentional dictation errors.   Shaune Pollack, MD 09/26/22 [id number]

## 2022-09-25 NOTE — Telephone Encounter (Signed)
Pt. Girlfriend calling in he is coughing up blood and needs medical advise

## 2022-09-26 DIAGNOSIS — K529 Noninfective gastroenteritis and colitis, unspecified: Secondary | ICD-10-CM | POA: Diagnosis not present

## 2022-09-26 DIAGNOSIS — A09 Infectious gastroenteritis and colitis, unspecified: Secondary | ICD-10-CM | POA: Diagnosis not present

## 2022-09-26 LAB — COMPREHENSIVE METABOLIC PANEL
ALT: 18 U/L (ref 0–44)
AST: 18 U/L (ref 15–41)
Albumin: 3.7 g/dL (ref 3.5–5.0)
Alkaline Phosphatase: 66 U/L (ref 38–126)
Anion gap: 9 (ref 5–15)
BUN: 12 mg/dL (ref 6–20)
CO2: 26 mmol/L (ref 22–32)
Calcium: 8.8 mg/dL — ABNORMAL LOW (ref 8.9–10.3)
Chloride: 103 mmol/L (ref 98–111)
Creatinine, Ser: 0.86 mg/dL (ref 0.61–1.24)
GFR, Estimated: >60 mL/min (ref >60)
Glucose, Bld: 103 mg/dL — ABNORMAL HIGH (ref 70–99)
Potassium: 4.2 mmol/L (ref 3.5–5.1)
Sodium: 138 mmol/L (ref 135–145)
Total Bilirubin: 0.8 mg/dL (ref 0.3–1.2)
Total Protein: 6.3 g/dL — ABNORMAL LOW (ref 6.5–8.1)

## 2022-09-26 LAB — GASTROINTESTINAL PANEL BY PCR, STOOL (REPLACES STOOL CULTURE)

## 2022-09-26 LAB — APTT
aPTT: >200 seconds (ref 24–36)
aPTT: 73 seconds — ABNORMAL HIGH (ref 24–36)

## 2022-09-26 LAB — HEPARIN LEVEL (UNFRACTIONATED)
Heparin Unfractionated: >1.1 IU/mL — ABNORMAL HIGH (ref 0.30–0.70)
Heparin Unfractionated: >1.1 IU/mL — ABNORMAL HIGH (ref 0.30–0.70)

## 2022-09-26 LAB — HIV ANTIBODY (ROUTINE TESTING W REFLEX): HIV Screen 4th Generation wRfx: NONREACTIVE

## 2022-09-26 LAB — CBC
HCT: 44.2 % (ref 39.0–52.0)
Hemoglobin: 14.9 g/dL (ref 13.0–17.0)
MCH: 29.3 pg (ref 26.0–34.0)
MCHC: 33.7 g/dL (ref 30.0–36.0)
MCV: 87 fL (ref 80.0–100.0)
Platelets: 215 10*3/uL (ref 150–400)
RBC: 5.08 MIL/uL (ref 4.22–5.81)
RDW: 12.2 % (ref 11.5–15.5)
WBC: 7.7 10*3/uL (ref 4.0–10.5)
nRBC: 0 % (ref 0.0–0.2)

## 2022-09-26 LAB — PHOSPHORUS: Phosphorus: 4 mg/dL (ref 2.5–4.6)

## 2022-09-26 LAB — HEMOGLOBIN AND HEMATOCRIT, BLOOD
HCT: 47.7 % (ref 39.0–52.0)
Hemoglobin: 15.9 g/dL (ref 13.0–17.0)

## 2022-09-26 LAB — PROTIME-INR
INR: 1.3 — ABNORMAL HIGH (ref 0.8–1.2)
Prothrombin Time: 15.6 seconds — ABNORMAL HIGH (ref 11.4–15.2)

## 2022-09-26 LAB — MAGNESIUM: Magnesium: 2.2 mg/dL (ref 1.7–2.4)

## 2022-09-26 MED ORDER — HEPARIN BOLUS VIA INFUSION
5000.0000 [IU] | Freq: Once | INTRAVENOUS | Status: AC
  Administered 2022-09-26: 5000 [IU] via INTRAVENOUS
  Filled 2022-09-26: qty 5000

## 2022-09-26 MED ORDER — HEPARIN (PORCINE) 25000 UT/250ML-% IV SOLN
1650.0000 [IU]/h | INTRAVENOUS | Status: DC
  Administered 2022-09-26 – 2022-09-27 (×2): 1650 [IU]/h via INTRAVENOUS
  Filled 2022-09-26 (×2): qty 250

## 2022-09-26 MED ORDER — NICOTINE 21 MG/24HR TD PT24
21.0000 mg | MEDICATED_PATCH | Freq: Every day | TRANSDERMAL | Status: DC
  Administered 2022-09-26 – 2022-09-27 (×2): 21 mg via TRANSDERMAL
  Filled 2022-09-26 (×2): qty 1

## 2022-09-26 MED ORDER — ORAL CARE MOUTH RINSE: 15.0000 mL | OROMUCOSAL | Status: DC | PRN

## 2022-09-26 MED ORDER — LOPERAMIDE HCL 2 MG PO CAPS
2.0000 mg | ORAL_CAPSULE | ORAL | Status: DC | PRN
  Administered 2022-09-26: 2 mg via ORAL
  Filled 2022-09-26: qty 1

## 2022-09-26 NOTE — Progress Notes (Signed)
PROGRESS NOTE    Jake Elliott  AOZ:[id number] DOB: 08/11/1970 DOA: 09/25/2022 PCP: Patient, No Pcp Per   Brief Narrative:  This 52 y.o. male with PMH significant for attention deficit hyperactivity disorder, bipolar disorder, depression, hypertension, hypertrophic cardiomyopathy, PTSD, history of PE on Eliquis presented in the ED with complaints of abdominal pain associated with blood in the stools.  Patient reports he was admitted last month for blood clot and was started on antibiotics and was given Eliquis at discharge.  Patient has completed the antibiotics course and continued to take Eliquis.  Workup in the ED shows WBC 13 K, CT angio: No evidence of aneurysm, dissection or other aortic pathology.  Findings consistent with Nonspecific inflammatory or ischemic colitis.  C. difficile and GI panel negative.  Patient continued to have rectal bleeding.  GI is consulted.  Assessment & Plan:   Principal Problem:   Infectious colitis, enteritis and gastroenteritis Active Problems:   HOCM (hypertrophic obstructive cardiomyopathy)   OSA (obstructive sleep apnea)   Essential hypertension   Peripheral autonomic neuropathy of unknown cause   Affective bipolar disorder   Chronic pain syndrome   Attention deficit hyperactivity disorder (ADHD), predominantly inattentive type   Tobacco use disorder  Infectious colitis: Patient presented with generalized abdominal pain associated with bright red blood per rectum. CT A/P shows finding consistent with infectious versus ischemic versus colitis. Lactic acid 1.8, less likely ischemic colitis. Started on empiric antibiotics ( ciprofloxacin and Flagyl) Continue IV fluid resuscitation. Patient has completed course of antibiotics a month ago.   C. difficile and GI panel is negative.  Discontinue enteric precautions. Continue adequate pain control. H&H remains stable.  Continue to monitor H&H.   Bright red blood per rectum: This could be secondary to  internal  hemorrhoids. H&H remains stable. Patient continues to have bright red blood per rectum. GI is consulted.  Eliquis is on hold.   History of pulmonary embolism: Hold Eliquis given bright red blood per rectum.   History of hypertrophic cardiomyopathy: Continue metoprolol.   GERD Continue pantoprazole 40 mg daily.   Obesity: Diet and exercise discussed in detail   Hyperlipidemia Continue Crestor 20 mg daily.   Hypertension: Continue metoprolol. Start hydralazine 10 mg IV every 6 hours as needed for SBP above 170   Obstructive sleep apnea: CPAP at night.   Tobacco abuse: Smoking cessation counseling completed.  DVT prophylaxis: SCDs Code Status: Full code Family Communication: No family at bed side. Disposition Plan:    Status is: Inpatient Remains inpatient appropriate because:    Admitted for abdominal pain associated with rectal bleeding,  GI is consulted.  Consultants:  Gastroenterology  Procedures: CT abdomen Antimicrobials:  Ciprofloxacin /Flagyl  Subjective: Patient was seen and examined at bedside.  Overnight events noted.   He continued to complaining about abdominal pain, denies Nausea, vomiting. Every time he goes to bowel movement,  has alot of bright red blood per rectum.  Objective: Vitals:   09/25/22 1720 09/25/22 2031 09/26/22 0405 09/26/22 0800  BP: (!) 150/95 (!) 159/97 (!) 140/83 (!) 187/103  Pulse:  72 (!) 59 64  Resp:  Temp:  (!) 97.4 F (36.3 C) 98.6 F (37 C) 98.3 F (36.8 C)  TempSrc:   Oral Oral  SpO2:  100% 99% 100%  Weight:        Intake/Output Summary (Last 24 hours) at 09/26/2022 0949 Last data filed at 09/26/2022 0205 Gross per 24 hour  Intake 2221.67 ml  Output --  Net 2221.67 ml   Filed Weights   09/25/22 1458  Weight: 102.1 kg    Examination:  General exam: Appears calm and comfortable, not in any acute distress. Respiratory system: Clear to auscultation. Respiratory effort  normal. Cardiovascular system: S1 & S2 heard, regular rate and rhythm, no murmur. Gastrointestinal system: Abdomen is soft, mildly distended, generalized tenderness noted , BS + Central nervous system: Alert and oriented x 3. No focal neurological deficits. Extremities: No edema, no cyanosis, no clubbing Skin: No rashes, lesions or ulcers Psychiatry: Judgement and insight appear normal. Mood & affect appropriate.     Data Reviewed: I have personally reviewed following labs and imaging studies  CBC: Recent Labs  Lab 09/25/22 1500 09/26/22 0446  WBC 13.0* 7.7  HGB 17.9* 14.9  HCT 51.7 44.2  MCV 86.6 87.0  PLT 250 215   Basic Metabolic Panel: Recent Labs  Lab 09/25/22 1500 09/26/22 0446  NA 136 138  K 4.3 4.2  CL 104 103  CO2 18* 26  GLUCOSE 135* 103*  BUN 15 12  CREATININE 0.84 0.86  CALCIUM 9.6 8.8*  MG  --  2.2  PHOS  --  4.0   GFR: Estimated Creatinine Clearance: 128.4 mL/min (by C-G formula based on SCr of 0.86 mg/dL). Liver Function Tests: Recent Labs  Lab 09/25/22 1500 09/26/22 0446  AST 30 18  ALT 23 18  ALKPHOS 79 66  BILITOT 0.8 0.8  PROT 7.6 6.3*  ALBUMIN 4.3 3.7   No results for input(s): "LIPASE", "AMYLASE" in the last 168 hours. No results for input(s): "AMMONIA" in the last 168 hours. Coagulation Profile: Recent Labs  Lab 09/25/22 1500  INR 1.0   Cardiac Enzymes: No results for input(s): "CKTOTAL", "CKMB", "CKMBINDEX", "TROPONINI" in the last 168 hours. BNP (last 3 results) No results for input(s): "PROBNP" in the last 8760 hours. HbA1C: No results for input(s): "HGBA1C" in the last 72 hours. CBG: No results for input(s): "GLUCAP" in the last 168 hours. Lipid Profile: No results for input(s): "CHOL", "HDL", "LDLCALC", "TRIG", "CHOLHDL", "LDLDIRECT" in the last 72 hours. Thyroid Function Tests: No results for input(s): "TSH", "T4TOTAL", "FREET4", "T3FREE", "THYROIDAB" in the last 72 hours. Anemia Panel: No results for input(s):  "VITAMINB12", "FOLATE", "FERRITIN", "TIBC", "IRON", "RETICCTPCT" in the last 72 hours. Sepsis Labs: Recent Labs  Lab 09/25/22 1622  LATICACIDVEN 1.8    Recent Results (from the past 240 hour(s))  C Difficile Quick Screen w PCR reflex     Status: None   Collection Time: 09/25/22  9:05 PM   Specimen: STOOL  Result Value Ref Range Status   C Diff antigen NEGATIVE NEGATIVE Final   C Diff toxin NEGATIVE NEGATIVE Final   C Diff interpretation No C. difficile detected.  Final    Comment: Performed at [facility], [address] [place], [place] [address]  Gastrointestinal Panel by PCR , Stool     Status: None   Collection Time: 09/25/22  9:55 PM  Result Value Ref Range Status   Campylobacter species NOT DETECTED NOT DETECTED Final   Plesimonas shigelloides NOT DETECTED NOT DETECTED Final   Salmonella species NOT DETECTED NOT DETECTED Final   Yersinia enterocolitica NOT DETECTED NOT DETECTED Final   Vibrio species NOT DETECTED NOT DETECTED Final   Vibrio cholerae NOT DETECTED NOT DETECTED Final   Enteroaggregative E coli (EAEC) NOT DETECTED NOT DETECTED Final   Enteropathogenic E coli (EPEC) NOT DETECTED NOT DETECTED Final   Enterotoxigenic E coli (ETEC) NOT DETECTED  NOT DETECTED Final   Shiga like toxin producing E coli (STEC) NOT DETECTED NOT DETECTED Final   Shigella/Enteroinvasive E coli (EIEC) NOT DETECTED NOT DETECTED Final   Cryptosporidium NOT DETECTED NOT DETECTED Final   Cyclospora cayetanensis NOT DETECTED NOT DETECTED Final   Entamoeba histolytica NOT DETECTED NOT DETECTED Final   Giardia lamblia NOT DETECTED NOT DETECTED Final   Adenovirus F40/41 NOT DETECTED NOT DETECTED Final   Astrovirus NOT DETECTED NOT DETECTED Final   Norovirus GI/GII NOT DETECTED NOT DETECTED Final   Rotavirus A NOT DETECTED NOT DETECTED Final   Sapovirus (I, II, IV, and V) NOT DETECTED NOT DETECTED Final    Comment: Performed at [facility], [address] [place],  [place] [address]     Radiology Studies: CT ANGIO GI BLEED  Result Date: 09/25/2022 CLINICAL DATA:  Mesenteric ischemia EXAM: CTA ABDOMEN AND PELVIS WITHOUT AND WITH CONTRAST TECHNIQUE: Multidetector CT imaging of the abdomen and pelvis was performed using the standard protocol during bolus administration of intravenous contrast. Multiplanar reconstructed images and MIPs were obtained and reviewed to evaluate the vascular anatomy. RADIATION DOSE REDUCTION: This exam was performed according to the departmental dose-optimization program which includes automated exposure control, adjustment of the mA and/or kV according to patient size and/or use of iterative reconstruction technique. CONTRAST:  OMNIPAQUE IOHEXOL 350 MG/ML SOLN COMPARISON:  CT chest angiogram, 07/06/2022 FINDINGS: VASCULAR Normal contour and caliber of the abdominal aorta. No evidence of aneurysm, dissection, or other acute aortic pathology. Standard branching pattern of the abdominal aorta, with solitary bilateral renal arteries. Moderate mixed calcific atherosclerosis. The branch vessel origins are widely patent, including the superior mesenteric artery and its proximal order branches. Review of the MIP images confirms the above findings. NON-VASCULAR Lower chest: No acute abnormality. Hepatobiliary: No solid liver abnormality is seen. No gallstones, gallbladder wall thickening, or biliary dilatation. Pancreas: Unremarkable. No pancreatic ductal dilatation or surrounding inflammatory changes. Spleen: Normal in size without significant abnormality. Adrenals/Urinary Tract: Adrenal glands are unremarkable. Kidneys are normal, without renal calculi, solid lesion, or hydronephrosis. Bladder is unremarkable. Stomach/Bowel: Stomach is within normal limits. Appendix appears normal. Long segment wall thickening mucosal hyperenhancement of the descending colon, with focal fat stranding about the mid descending colon (series 9, image 87). Sigmoid  diverticulosis. Lymphatic: No enlarged abdominal or pelvic lymph nodes. Reproductive: No mass or other significant abnormality. Other: No abdominal wall hernia or abnormality. No ascites. Musculoskeletal: No acute or significant osseous findings. IMPRESSION: 1. Normal contour and caliber of the abdominal aorta. No evidence of aneurysm, dissection, or other acute aortic pathology. Moderate mixed calcific atherosclerosis. The branch vessel origins are widely patent, including the superior mesenteric artery and its proximal order branches. 2. Long segment wall thickening and mucosal hyperenhancement of the descending colon, with focal fat stranding about the mid descending colon. Findings are consistent with nonspecific infectious, inflammatory, or ischemic colitis. 3. Sigmoid diverticulosis without evidence of acute diverticulitis. Note that diverticulosis of the sigmoid colon does not appear to be the nidus of inflammation described above. Aortic Atherosclerosis (ICD10-I70.0). Electronically Signed   By: Jearld Lesch M.D.   On: 09/25/2022 17:08    Scheduled Meds:  docusate sodium  100 mg Oral BID   melatonin  10 mg Oral QHS   metoprolol tartrate  25 mg Oral BID   rosuvastatin  10 mg Oral Daily   Continuous Infusions:  sodium chloride 100 mL/hr at 09/26/22 0205   ciprofloxacin Stopped (09/25/22 2213)   metronidazole  LOS: 1 day    Time spent: 35 mins    Willeen Niece, MD [facility] Lactic acid 1 point  If 7PM-7AM, please contact night-coverage

## 2022-09-26 NOTE — Consult Note (Signed)
Wyline Mood , MD [address], [place], [place], [address] [address], [place], [place], [address] Phone: [phone]  Fax: [phone]  Consultation  Referring Provider:   Dr Idelle Leech Primary Care Physician:  Patient, No Pcp Per Primary Gastroenterologist:  None         Reason for Consultation:     Colitis  Date of Admission:  09/25/2022 Date of Consultation:  09/26/2022         HPI:   Jake Elliott is a 52 y.o. male with ADHD , PTSD, history of PE on eloquis presented to the ER with abdominal pain and blood in his stools. PE was last month.   He says he developed a nose bleed a week back that stopped, then 3 days back developed generalized abdominal pain followed by bright red blood per rectum (pics taken on his phone) today pain has improved and last stool was dark in color.  No vomiting. He has been taking over 10 alleves a day in addition to [facility] powder for over 10 years for neck issues.    CT angiogram showed long segment wall thickening of the descending colon suggestive of colitis.   Hb 14.9 grams, Stool for infection and C diff - negative.    Past Medical History:  Diagnosis Date   Angina at rest    Anxiety    Attention-deficit/hyperactivity disorder    Bipolar disorder    Depression    History of tobacco abuse    Hypertension    Hypertrophic cardiomyopathy    s/p myomectomy   Peripheral neuropathy    PTSD (post-traumatic stress disorder)     Past Surgical History:  Procedure Laterality Date   CARDIAC CATHETERIZATION     11/14/2010   CORONARY ARTERY BYPASS GRAFT     for myomectomy for hypertrophic cardiomyopathy    Prior to Admission medications   Medication Sig Start Date End Date Taking? Authorizing Provider  albuterol (VENTOLIN HFA) 108 (90 Base) MCG/ACT inhaler Inhale 2 puffs into the lungs every 6 (six) hours as needed for wheezing or shortness of breath. 07/06/22  Yes Pia Mau M, PA-C  apixaban (ELIQUIS) 5 MG TABS tablet Take  1 tablet (5 mg total) by mouth 2 (two) times daily. 08/10/22 11/08/22 Yes Dgayli, Lianne Bushy, MD  diphenhydrAMINE (BENADRYL) 25 MG tablet Take 3 tablets (75 mg total) by mouth at bedtime as needed. 01/11/17  Yes Brandy Hale, MD  metoprolol tartrate (LOPRESSOR) 25 MG tablet Take 1 tablet (25 mg total) by mouth 2 (two) times daily. 04/17/22  Yes Gollan, Tollie Pizza, MD  omeprazole (PRILOSEC OTC) 20 MG tablet Take 1 tablet (20 mg total) by mouth daily. 08/10/22 08/10/23 Yes Dgayli, Lianne Bushy, MD  rosuvastatin (CRESTOR) 10 MG tablet Take 1 tablet (10 mg total) by mouth daily. Patient not taking: Reported on 09/25/2022 08/14/22 11/12/22  Antonieta Iba, MD  tadalafil (CIALIS) 20 MG tablet Take 1 tablet (20 mg total) by mouth daily as needed for erectile dysfunction. 04/17/22  Yes Gollan, Tollie Pizza, MD  Aspirin-Caffeine (BC FAST PAIN RELIEF PO) Take by mouth as needed.    [provider]  diazepam (VALIUM) 5 MG tablet 1 po 30 minutes prior to MRI scan. May repeat x 1 right before MRI scan if needed. You will need a driver. Patient not taking: Reported on 09/25/2022 09/07/22   Drake Leach, PA-C  Melatonin 10 MG CAPS Take 10 mg by mouth at bedtime. Patient not taking: Reported on 09/25/2022 01/11/17  Brandy Hale, MD  promethazine-dextromethorphan (PROMETHAZINE-DM) 6.25-15 MG/5ML syrup Take 5 mLs by mouth 4 (four) times daily as needed. Patient not taking: Reported on 08/10/2022 06/30/22   Margaretann Loveless, PA-C    Family History  Problem Relation Age of Onset   Cancer - Lung Mother    Skin cancer Mother    Alcohol abuse Mother    Drug abuse Mother    Stroke Father    Hypertension Father    Alcohol abuse Father    Drug abuse Father    Drug abuse Sister      Social History   Tobacco Use   Smoking status: Every Day    Packs/day: 1.00    Years: 15.00    Additional pack years: 0.00    Total pack years: 15.00    Types: Cigarettes    Start date: 12/31/1988   Smokeless tobacco: Never   Tobacco  comments:    1 PPD  Vaping Use   Vaping Use: Never used  Substance Use Topics   Alcohol use: No    Alcohol/week: 0.0 standard drinks of alcohol   Drug use: No    Allergies as of 09/25/2022   (No Known Allergies)    Review of Systems:    All systems reviewed and negative except where noted in HPI.   Physical Exam:  Vital signs in last 24 hours: Temp:  [97.4 F (36.3 C)-98.6 F (37 C)] 98.3 F (36.8 C) (04/13 0800) Pulse Rate:  [59-102] 64 (04/13 0800) Resp:  [18-20] 20 (04/13 0800) BP: (140-187)/(83-103) 140/87 (04/13 0930) SpO2:  [99 %-100 %] 100 % (04/13 0800) Weight:  [102.1 kg] 102.1 kg (04/12 1458) Last BM Date : 09/26/22 General:   Pleasant, cooperative in NAD Head:  Normocephalic and atraumatic. Eyes:   No icterus.   Conjunctiva pink. PERRLA. Ears:  Normal auditory acuity. Neck:  Supple; no masses or thyroidomegaly Lungs: Respirations even and unlabored. Lungs clear to auscultation bilaterally.   No wheezes, crackles, or rhonchi.  Heart:  Regular rate and rhythm;  Without murmur, clicks, rubs or gallops Abdomen:  Soft, nondistended, nontender. Normal bowel sounds. No appreciable masses or hepatomegaly.  No rebound or guarding.  Neurologic:  Alert and oriented x3;  grossly normal neurologically. Skin:  Intact without significant lesions or rashes. Cervical Nodes:  No significant cervical adenopathy. Psych:  Alert and cooperative. Normal affect.  LAB RESULTS: Recent Labs    09/25/22 1500 09/26/22 0446  WBC 13.0* 7.7  HGB 17.9* 14.9  HCT 51.7 44.2  PLT 250 215   BMET Recent Labs    09/25/22 1500 09/26/22 0446  NA 136 138  K 4.3 4.2  CL 104 103  CO2 18* 26  GLUCOSE 135* 103*  BUN 15 12  CREATININE 0.84 0.86  CALCIUM 9.6 8.8*   LFT Recent Labs    09/26/22 0446  PROT 6.3*  ALBUMIN 3.7  AST 18  ALT 18  ALKPHOS 66  BILITOT 0.8   PT/INR Recent Labs    09/25/22 1500  LABPROT 13.0  INR 1.0    STUDIES: CT ANGIO GI BLEED  Result Date:  09/25/2022 CLINICAL DATA:  Mesenteric ischemia EXAM: CTA ABDOMEN AND PELVIS WITHOUT AND WITH CONTRAST TECHNIQUE: Multidetector CT imaging of the abdomen and pelvis was performed using the standard protocol during bolus administration of intravenous contrast. Multiplanar reconstructed images and MIPs were obtained and reviewed to evaluate the vascular anatomy. RADIATION DOSE REDUCTION: This exam was performed according to the departmental dose-optimization program which  includes automated exposure control, adjustment of the mA and/or kV according to patient size and/or use of iterative reconstruction technique. CONTRAST:  OMNIPAQUE IOHEXOL 350 MG/ML SOLN COMPARISON:  CT chest angiogram, 07/06/2022 FINDINGS: VASCULAR Normal contour and caliber of the abdominal aorta. No evidence of aneurysm, dissection, or other acute aortic pathology. Standard branching pattern of the abdominal aorta, with solitary bilateral renal arteries. Moderate mixed calcific atherosclerosis. The branch vessel origins are widely patent, including the superior mesenteric artery and its proximal order branches. Review of the MIP images confirms the above findings. NON-VASCULAR Lower chest: No acute abnormality. Hepatobiliary: No solid liver abnormality is seen. No gallstones, gallbladder wall thickening, or biliary dilatation. Pancreas: Unremarkable. No pancreatic ductal dilatation or surrounding inflammatory changes. Spleen: Normal in size without significant abnormality. Adrenals/Urinary Tract: Adrenal glands are unremarkable. Kidneys are normal, without renal calculi, solid lesion, or hydronephrosis. Bladder is unremarkable. Stomach/Bowel: Stomach is within normal limits. Appendix appears normal. Long segment wall thickening mucosal hyperenhancement of the descending colon, with focal fat stranding about the mid descending colon (series 9, image 87). Sigmoid diverticulosis. Lymphatic: No enlarged abdominal or pelvic lymph nodes.  Reproductive: No mass or other significant abnormality. Other: No abdominal wall hernia or abnormality. No ascites. Musculoskeletal: No acute or significant osseous findings. IMPRESSION: 1. Normal contour and caliber of the abdominal aorta. No evidence of aneurysm, dissection, or other acute aortic pathology. Moderate mixed calcific atherosclerosis. The branch vessel origins are widely patent, including the superior mesenteric artery and its proximal order branches. 2. Long segment wall thickening and mucosal hyperenhancement of the descending colon, with focal fat stranding about the mid descending colon. Findings are consistent with nonspecific infectious, inflammatory, or ischemic colitis. 3. Sigmoid diverticulosis without evidence of acute diverticulitis. Note that diverticulosis of the sigmoid colon does not appear to be the nidus of inflammation described above. Aortic Atherosclerosis (ICD10-I70.0). Electronically Signed   By: Jearld Lesch M.D.   On: 09/25/2022 17:08      Impression / Plan:   Jake Elliott is a 52 y.o. y/o male admitted with 1-2 days history of lower abdominal pain , associated with bright red blood per rectum ,  CT angiogram shows colitis in the descending colon : negative stool tests for infection. He has been taking over 10 alleves a day in addition to [facility] powders for over 10 years for neck pain which has likely cause nsaid related colitis.   Plan  Monitor CBC and transfuse as needed No nsaids IV PPI Continue anttibiotics for a total of 3-5 days and can stop if improving  Can start on clears Can start on IV heparin if needed for recent PE IF he does not continue to improve clinically next 24 hours then will need flexible sigmoidoscopy on Monday   Thank you for involving me in the care of this patient.      LOS: 1 day   Wyline Mood, MD  09/26/2022, 1:48 PM

## 2022-09-26 NOTE — Consult Note (Signed)
ANTICOAGULATION CONSULT NOTE - Initial Consult  Pharmacy Consult for heparin Indication: pulmonary embolus  No Known Allergies  Patient Measurements: Height: 6\' 2"  (188 cm) Weight: 102.1 kg (225 lb 1.4 oz) IBW/kg (Calculated) : 82.2 Heparin Dosing Weight: 98.9 kg  Vital Signs: Temp: 98.9 F (37.2 C) (04/13 2021) BP: 149/93 (04/13 2021) Pulse Rate: 58 (04/13 2021)  Labs: Recent Labs    09/25/22 1500 09/26/22 0446 09/26/22 1400 09/26/22 1438 09/26/22 2111  HGB 17.9* 14.9 15.9  --   --   HCT 51.7 44.2 47.7  --   --   PLT 250 215  --   --   --   APTT  --   --   --  >200* 73*  LABPROT 13.0  --   --  15.6*  --   INR 1.0  --   --  1.3*  --   HEPARINUNFRC  --   --   --  >1.10* >1.10*  CREATININE 0.84 0.86  --   --   --      Estimated Creatinine Clearance: 132.6 mL/min (by C-G formula based on SCr of 0.86 mg/dL).   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 CTAP c/f colitis. Patient endorses he has been taking over 10 naproxen tabs a day in addition to [facility] powder for over 10 years for neck issues. Patient also takes Eliquis which was started in January 2024 empirically for PE, and imaging at that time revealed pulmonary vein thrombus. Last dose of apixaban on 4/12 @ 2114 while here at [facility]. Hgb is stable at 14.9.  Baseline Labs: aPTT - ordered; INR - ordered Hgb - 14.9; Plts - 215  Goal of Therapy:  Heparin level 0.3-0.7 units/ml Monitor platelets by anticoagulation protocol: Yes    Plan:  Last dose of apixaban on 4/12 @ 2114 Titrate by aPTT's until lab correlation is noted, then titrate by anti-xa alone. 4/13 @ 2111:  aPTT = 73,  HL = > 1.10 aPTT therapeutic X 1,  HL elevated from Eliquis PTA Will continue pt on current rate and recheck aPTT and HL in 6 hrs on 4/14 @ 0300 Continue to monitor H&H and platelets daily while on heparin gtt.  Brighid Koch D 09/26/2022 9:54 PM

## 2022-09-26 NOTE — Consult Note (Addendum)
ANTICOAGULATION CONSULT NOTE - Initial Consult  Pharmacy Consult for heparin Indication: pulmonary embolus  No Known Allergies  Patient Measurements: Weight: 102.1 kg (225 lb) Heparin Dosing Weight: 98.9 kg  Vital Signs: Temp: 98.3 F (36.8 C) (04/13 0800) Temp Source: Oral (04/13 0800) BP: 140/87 (04/13 0930) Pulse Rate: 64 (04/13 0800)  Labs: Recent Labs    09/25/22 1500 09/26/22 0446  HGB 17.9* 14.9  HCT 51.7 44.2  PLT 250 215  LABPROT 13.0  --   INR 1.0  --   CREATININE 0.84 0.86    Estimated Creatinine Clearance: 128.4 mL/min (by C-G formula based on SCr of 0.86 mg/dL).   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 CTAP c/f colitis. Patient endorses he has been taking over 10 naproxen tabs a day in addition to [facility] powder for over 10 years for neck issues. Patient also takes Eliquis which was started in January 2024 empirically for PE, and imaging at that time revealed pulmonary vein thrombus. Last dose of apixaban on 4/12 @ 2114 while here at [facility]. Hgb is stable at 14.9.  Baseline Labs: aPTT - ordered; INR - ordered Hgb - 14.9; Plts - 215  Goal of Therapy:  Heparin level 0.3-0.7 units/ml Monitor platelets by anticoagulation protocol: Yes   Plan:  Last dose of apixaban on 4/12 @ 2114 Give 5000 units bolus x1; then start heparin infusion at 1650 units/hr Check aPTT/Anti-Xa level in 6 hours and daily once consecutively therapeutic.  Titrate by aPTT's until lab correlation is  noted, then titrate by anti-xa alone. Continue to monitor H&H and platelets daily while on heparin gtt.   Will M. Dareen Piano, PharmD PGY-1 Pharmacy Resident 09/26/2022 2:11 PM

## 2022-09-27 DIAGNOSIS — K529 Noninfective gastroenteritis and colitis, unspecified: Secondary | ICD-10-CM | POA: Diagnosis not present

## 2022-09-27 DIAGNOSIS — A09 Infectious gastroenteritis and colitis, unspecified: Secondary | ICD-10-CM | POA: Diagnosis not present

## 2022-09-27 LAB — CBC
HCT: 43.1 % (ref 39.0–52.0)
Hemoglobin: 14.9 g/dL (ref 13.0–17.0)
MCH: 29.6 pg (ref 26.0–34.0)
MCHC: 34.6 g/dL (ref 30.0–36.0)
MCV: 85.5 fL (ref 80.0–100.0)
Platelets: 197 10*3/uL (ref 150–400)
RBC: 5.04 MIL/uL (ref 4.22–5.81)
RDW: 12.3 % (ref 11.5–15.5)
WBC: 6.5 10*3/uL (ref 4.0–10.5)
nRBC: 0 % (ref 0.0–0.2)

## 2022-09-27 LAB — HEPARIN LEVEL (UNFRACTIONATED): Heparin Unfractionated: >1.1 IU/mL — ABNORMAL HIGH (ref 0.30–0.70)

## 2022-09-27 LAB — APTT: aPTT: 88 seconds — ABNORMAL HIGH (ref 24–36)

## 2022-09-27 MED ORDER — PANTOPRAZOLE SODIUM 40 MG PO TBEC: 40.0000 mg | DELAYED_RELEASE_TABLET | Freq: Two times a day (BID) | ORAL | 0 refills | Status: DC

## 2022-09-27 MED ORDER — METRONIDAZOLE 500 MG PO TABS: 500.0000 mg | ORAL_TABLET | Freq: Three times a day (TID) | ORAL | 0 refills | Status: DC

## 2022-09-27 MED ORDER — OXYCODONE HCL 5 MG PO TABS: 5.0000 mg | ORAL_TABLET | Freq: Three times a day (TID) | ORAL | 0 refills | Status: AC | PRN

## 2022-09-27 MED ORDER — CIPROFLOXACIN HCL 500 MG PO TABS: 500.0000 mg | ORAL_TABLET | Freq: Two times a day (BID) | ORAL | 0 refills | Status: AC

## 2022-09-27 NOTE — Discharge Instructions (Signed)
Advised to follow-up with primary care physician in 1 week. Advised to follow-up with gastroenterology in 4 weeks. Advised to take pantoprazole 40 mg twice daily for 4 weeks for duodenal ulcer. Advised to refrain from using NSAIDs. Advised to take ciprofloxacin 500 mg twice daily and metronidazole 500 mg 3 times daily for 3 days for colitis

## 2022-09-27 NOTE — Discharge Summary (Signed)
Physician Discharge Summary  Jake Elliott ZOX:[id number] DOB: May 01, 1971 DOA: 09/25/2022  PCP: Patient, No Pcp Per  Admit date: 09/25/2022  Discharge date: 09/27/2022  Admitted From: Home  Disposition:  Home.  Recommendations for Outpatient Follow-up:  Follow up with PCP in 1-2 weeks. Please obtain BMP/CBC in one week. Advised to follow-up with gastroenterology in 4 weeks. Advised to take pantoprazole 40 mg twice daily for 4 weeks for duodenal ulcer. Advised to refrain from using NSAIDs. Advised to take ciprofloxacin 500 mg twice daily and metronidazole 500 mg 3 times daily for 3 days for colitis.  Home Health:None Equipment/Devices:None  Discharge Condition: Stable CODE STATUS:Full code Diet recommendation: Heart Healthy  Brief [facility] Course: This 52 y.o. male with PMH significant for attention deficit hyperactivity disorder, bipolar disorder, depression, hypertension, hypertrophic cardiomyopathy, PTSD, history of PE on Eliquis presented in the ED with complaints of abdominal pain associated with blood in the stools.  Patient reports he was admitted last month for blood clot and was started on antibiotics and was given Eliquis at discharge.  Patient has completed the antibiotics course and continued to take Eliquis.  Workup in the ED shows WBC 13 K, CT angio: No evidence of aneurysm, dissection or other aortic pathology.  Findings consistent with Nonspecific inflammatory or ischemic colitis. C. difficile and GI panel negative.  Patient continued to have rectal bleeding.  GI is consulted.  Patient has improvement in bleeding.  Infact bleeding has resolved.  No plans for any endoscopic evaluation.  Advised to continue Cipro 500 mg twice daily and Flagyl 500 mg 3 times daily for 3 more days to complete 5-day course.  Patient was explained in detail to refrain from using NSAIDs, Goody powders, advised to take pantoprazole 40 mg twice daily for 4 weeks for duodenal ulcer.Patient  tolerated soft bland diet.  Patient is being discharged home.  Discharge Diagnoses:  Principal Problem:   Infectious colitis, enteritis and gastroenteritis Active Problems:   HOCM (hypertrophic obstructive cardiomyopathy)   OSA (obstructive sleep apnea)   Essential hypertension   Peripheral autonomic neuropathy of unknown cause   Affective bipolar disorder   Chronic pain syndrome   Attention deficit hyperactivity disorder (ADHD), predominantly inattentive type   Tobacco use disorder   Colitis  Infectious colitis: Patient presented with generalized abdominal pain associated with bright red blood per rectum. CT A/P shows finding consistent with infectious versus ischemic versus colitis. Lactic acid 1.8, less likely ischemic colitis. Started on empiric antibiotics ( ciprofloxacin and Flagyl) Continue IV fluid resuscitation. Patient has completed course of antibiotics a month ago.   C. difficile and GI panel is negative.  Discontinue enteric precautions. Continue adequate pain control. H&H remains stable.  Continue antibiotics for 5 days.   Bright red blood per rectum: This could be secondary to internal  hemorrhoids. H&H remains stable. Patient continues to have bright red blood per rectum. GI is consulted.  No plans for any endoscopic evaluation.  GI signed off. Eliquis resumed, bleeding has resolved.   History of pulmonary embolism: Hold Eliquis given bright red blood per rectum. Eliquis resumed.   History of hypertrophic cardiomyopathy: Continue metoprolol.   GERD Continue pantoprazole 40 mg daily.   Obesity: Diet and exercise discussed in detail   Hyperlipidemia Continue Crestor 20 mg daily.   Hypertension: Continue metoprolol. Start hydralazine 10 mg IV every 6 hours as needed for SBP above 170   Obstructive sleep apnea: CPAP at night.   Tobacco abuse: Smoking cessation counseling completed.  Discharge Instructions  Discharge Instructions     Call MD  for:  difficulty breathing, headache or visual disturbances   Complete by: As directed    Call MD for:  persistant dizziness or light-headedness   Complete by: As directed    Call MD for:  persistant nausea and vomiting   Complete by: As directed    Diet - low sodium heart healthy   Complete by: As directed    Diet general   Complete by: As directed    Discharge instructions   Complete by: As directed    Advised to follow-up with primary care physician in 1 week. Advised to follow-up with gastroenterology in 4 weeks. Advised to take pantoprazole 40 mg twice daily for 4 weeks for duodenal ulcer. Advised to refrain from using NSAIDs.   Increase activity slowly   Complete by: As directed       Allergies as of 09/27/2022   No Known Allergies      Medication List     STOP taking these medications    BC FAST PAIN RELIEF PO   diazepam 5 MG tablet Commonly known as: Valium   Melatonin 10 MG Caps   omeprazole 20 MG tablet Commonly known as: PriLOSEC OTC   promethazine-dextromethorphan 6.25-15 MG/5ML syrup Commonly known as: PROMETHAZINE-DM   rosuvastatin 10 MG tablet Commonly known as: CRESTOR       TAKE these medications    albuterol 108 (90 Base) MCG/ACT inhaler Commonly known as: VENTOLIN HFA Inhale 2 puffs into the lungs every 6 (six) hours as needed for wheezing or shortness of breath.   apixaban 5 MG Tabs tablet Commonly known as: Eliquis Take 1 tablet (5 mg total) by mouth 2 (two) times daily.   ciprofloxacin 500 MG tablet Commonly known as: Cipro Take 1 tablet (500 mg total) by mouth 2 (two) times daily for 3 days.   diphenhydrAMINE 25 MG tablet Commonly known as: Benadryl Take 3 tablets (75 mg total) by mouth at bedtime as needed.   metoprolol tartrate 25 MG tablet Commonly known as: LOPRESSOR Take 1 tablet (25 mg total) by mouth 2 (two) times daily.   metroNIDAZOLE 500 MG tablet Commonly known as: Flagyl Take 1 tablet (500 mg total) by mouth 3  (three) times daily for 3 days.   oxyCODONE 5 MG immediate release tablet Commonly known as: Roxicodone Take 1 tablet (5 mg total) by mouth every 8 (eight) hours as needed for up to 3 days.   pantoprazole 40 MG tablet Commonly known as: Protonix Take 1 tablet (40 mg total) by mouth 2 (two) times daily for 28 days.   tadalafil 20 MG tablet Commonly known as: CIALIS Take 1 tablet (20 mg total) by mouth daily as needed for erectile dysfunction.        Follow-up Information     Wyline Mood, MD Follow up in 1 month(s).   Specialty: Gastroenterology Contact information: [address] [place] [address] [phone]                No Known Allergies  Consultations: Gastroenterology   Procedures/Studies: CT ANGIO GI BLEED  Result Date: 09/25/2022 CLINICAL DATA:  Mesenteric ischemia EXAM: CTA ABDOMEN AND PELVIS WITHOUT AND WITH CONTRAST TECHNIQUE: Multidetector CT imaging of the abdomen and pelvis was performed using the standard protocol during bolus administration of intravenous contrast. Multiplanar reconstructed images and MIPs were obtained and reviewed to evaluate the vascular anatomy. RADIATION DOSE REDUCTION: This exam was performed according to the departmental  dose-optimization program which includes automated exposure control, adjustment of the mA and/or kV according to patient size and/or use of iterative reconstruction technique. CONTRAST:  OMNIPAQUE IOHEXOL 350 MG/ML SOLN COMPARISON:  CT chest angiogram, 07/06/2022 FINDINGS: VASCULAR Normal contour and caliber of the abdominal aorta. No evidence of aneurysm, dissection, or other acute aortic pathology. Standard branching pattern of the abdominal aorta, with solitary bilateral renal arteries. Moderate mixed calcific atherosclerosis. The branch vessel origins are widely patent, including the superior mesenteric artery and its proximal order branches. Review of the MIP images confirms the above  findings. NON-VASCULAR Lower chest: No acute abnormality. Hepatobiliary: No solid liver abnormality is seen. No gallstones, gallbladder wall thickening, or biliary dilatation. Pancreas: Unremarkable. No pancreatic ductal dilatation or surrounding inflammatory changes. Spleen: Normal in size without significant abnormality. Adrenals/Urinary Tract: Adrenal glands are unremarkable. Kidneys are normal, without renal calculi, solid lesion, or hydronephrosis. Bladder is unremarkable. Stomach/Bowel: Stomach is within normal limits. Appendix appears normal. Long segment wall thickening mucosal hyperenhancement of the descending colon, with focal fat stranding about the mid descending colon (series 9, image 87). Sigmoid diverticulosis. Lymphatic: No enlarged abdominal or pelvic lymph nodes. Reproductive: No mass or other significant abnormality. Other: No abdominal wall hernia or abnormality. No ascites. Musculoskeletal: No acute or significant osseous findings. IMPRESSION: 1. Normal contour and caliber of the abdominal aorta. No evidence of aneurysm, dissection, or other acute aortic pathology. Moderate mixed calcific atherosclerosis. The branch vessel origins are widely patent, including the superior mesenteric artery and its proximal order branches. 2. Long segment wall thickening and mucosal hyperenhancement of the descending colon, with focal fat stranding about the mid descending colon. Findings are consistent with nonspecific infectious, inflammatory, or ischemic colitis. 3. Sigmoid diverticulosis without evidence of acute diverticulitis. Note that diverticulosis of the sigmoid colon does not appear to be the nidus of inflammation described above. Aortic Atherosclerosis (ICD10-I70.0). Electronically Signed   By: Jearld Lesch M.D.   On: 09/25/2022 17:08   MR LUMBAR SPINE WO CONTRAST  Result Date: 09/08/2022 CLINICAL DATA:  Initial evaluation for back pain with bilateral upper and lower extremity pain and numbness,  lumbar radiculopathy. EXAM: MRI LUMBAR SPINE WITHOUT CONTRAST TECHNIQUE: Multiplanar, multisequence MR imaging of the lumbar spine was performed. No intravenous contrast was administered. COMPARISON:  Previous radiograph from 02/07/2014 and MRI from 02/01/2013. FINDINGS: Segmentation: Standard. Lowest well-formed disc space labeled the L5-S1 level. Alignment: 2 mm degenerative retrolisthesis of L5 on S1, with trace anterolisthesis of L4 on L5. Underlying mild levoscoliosis. Vertebrae: Vertebral body height maintained without acute or chronic fracture. Bone marrow signal intensity within normal limits. Few scattered benign hemangiomata noted. No worrisome osseous lesions. Discogenic reactive endplate change with marrow edema present about the L4-5 interspace. No other abnormal marrow edema. Conus medullaris and cauda equina: Conus extends to the T12 level. Conus and cauda equina appear normal. Paraspinal and other soft tissues: Unremarkable. Disc levels: L1-2: Disc desiccation with mild disc bulge. Superimposed small left central disc protrusion with annular fissure indents the ventral thecal sac (series 4, image 10). No significant spinal stenosis. Foramina remain patent. L2-3: Degenerative intervertebral disc space narrowing with diffuse disc bulge and disc desiccation. No significant spinal stenosis. Foramina remain patent. L3-4: Mild degenerative intervertebral disc space narrowing with diffuse disc bulge and disc desiccation. Associated central annular fissure. Mild reactive endplate spurring. Mild bilateral facet hypertrophy. No significant spinal stenosis. Foramina remain patent. L4-5: Moderate degenerative intervertebral disc space narrowing with disc desiccation and diffuse disc bulge. Associated reactive endplate  change with marginal endplate osteophytic spurring and marrow edema, greater on the right. Mild bilateral facet hypertrophy. Resultant mild narrowing of the lateral recesses bilaterally. Central  canal remains patent. Mild bilateral L4 foraminal stenosis. L5-S1: Degenerative vertebral disc spacing with disc desiccation and diffuse disc bulge. Associated reactive endplate spurring. Superimposed broad-based left subarticular to foraminal disc protrusion closely approximates and/or contacts both the exiting left L5 and descending S1 nerve roots (series 5, image 32). Mild bilateral facet spurring. Resultant mild narrowing of the left lateral recess. Central canal remains patent. Mild left L5 foraminal narrowing. Right neural foramina remains adequately patent. IMPRESSION: 1. Broad-based left subarticular to foraminal disc protrusion at L5-S1, closely approximating and potentially affecting either the exiting left L5 or descending S1 nerve roots. 2. Disc bulge with facet hypertrophy at L4-5 with resultant mild bilateral lateral recess stenosis, with mild bilateral L4 foraminal narrowing. Discogenic reactive endplate change with marrow edema at this level could contribute to lower back pain. 3. Additional mild noncompressive disc bulging elsewhere within the lumbar spine without significant stenosis or impingement. Electronically Signed   By: Rise Mu M.D.   On: 09/08/2022 16:32   MR THORACIC SPINE WO CONTRAST  Result Date: 09/08/2022 CLINICAL DATA:  Initial evaluation for mid back pain. EXAM: MRI THORACIC SPINE WITHOUT CONTRAST TECHNIQUE: Multiplanar, multisequence MR imaging of the thoracic spine was performed. No intravenous contrast was administered. COMPARISON:  Prior radiograph from 02/07/2014. FINDINGS: Alignment: Dextroscoliosis. Alignment otherwise normal with preservation of the normal thoracic kyphosis. No significant listhesis. Vertebrae: Mild chronic height loss noted at the superior endplates of T1 through T4. Vertebral body height otherwise maintained. Bone marrow signal intensity within normal limits. Few scattered benign hemangiomata noted. No worrisome osseous lesions. Mild  reactive endplate changes present about the T7-8 interspace. Reactive marrow edema present about the right T5-6 facet due to facet arthritis (series 6, image 7). No other abnormal marrow edema. Cord:  Normal signal and morphology. Paraspinal and other soft tissues: Unremarkable. Disc levels: T1-2:  Unremarkable. T2-3: Right paracentral disc protrusion indents the right ventral thecal sac (series 7, image 8). Associated mild endplate spurring. No significant spinal stenosis. Mild right foraminal narrowing. Left neural foramen remains patent. T3-4: Broad-based shallow right paracentral disc protrusion mildly flattens the ventral thecal sac (series 7, image 11). Mild right-sided facet hypertrophy. No spinal stenosis. Foramina remain adequately patent. T4-5: Mild endplate spurring without significant disc bulge. Right-sided facet hypertrophy. No spinal stenosis. Mild right foraminal narrowing. Left neural foramina remains patent. T5-6: Small right paracentral disc protrusion mildly indents the ventral thecal sac (series 7, image 17). Right-sided facet hypertrophy with associated mild reactive marrow edema. No significant spinal stenosis. Foramina remain patent. T6-7: Mild disc bulge with small right paracentral disc protrusion (series 8, image 20). No spinal stenosis. Foramina remain patent. T7-8: Mild disc bulge with small central disc protrusion (series 8, image 24). No spinal stenosis. Foramina remain patent. T8-9: Mild disc bulge and endplate spurring. No significant spinal stenosis. Foramina remain patent. T9-10: Mild endplate spurring without significant disc bulge. Mild right-sided facet hypertrophy. No spinal stenosis. Foramina remain patent. T10-11: Minimal disc bulge with endplate spurring. Mild facet hypertrophy. No spinal stenosis. Foramina remain patent. T11-12:  Unremarkable. T12-L1:  Unremarkable. IMPRESSION: 1. Mild multilevel thoracic spondylosis with small disc protrusions at T2-3 through T7-8 as  above. No significant spinal stenosis. 2. Mild right foraminal narrowing at T2-3 and T4-5 related to disc bulge and facet hypertrophy. 3. Reactive marrow edema about the right T5-6 facet due  to facet arthritis. Finding could serve as a source for back pain. Electronically Signed   By: Rise Mu M.D.   On: 09/08/2022 16:23   MR CERVICAL SPINE WO CONTRAST  Result Date: 09/08/2022 CLINICAL DATA:  Initial evaluation for diffuse spinal pain, bilateral upper and lower extremity pain and numbness. EXAM: MRI CERVICAL SPINE WITHOUT CONTRAST TECHNIQUE: Multiplanar, multisequence MR imaging of the cervical spine was performed. No intravenous contrast was administered. COMPARISON:  Prior radiograph from 02/07/2014 and MRI from 02/01/2013. FINDINGS: Alignment: Straightening with slight reversal of the normal cervical lordosis. Underlying mild dextroscoliosis. Trace degenerative retrolisthesis of C3 on C4. Vertebrae: Mild chronic height loss noted at the superior endplates of T1 through T3. Vertebral body height maintained. No acute or recent fracture. Bone marrow signal intensity within normal limits. Benign hemangioma noted within the C3 vertebral body. No worrisome osseous lesions. Discogenic reactive endplate changes present about the C5-6 and C6-7 interspaces. No other abnormal marrow edema. Cord: Normal signal and morphology. Posterior Fossa, vertebral arteries, paraspinal tissues: Unremarkable. Disc levels: C2-C3: Mild right-sided uncovertebral spurring without significant disc bulge. Mild left-sided facet hypertrophy. No spinal stenosis. Foramina remain adequately patent. C3-C4: Mild degenerate intervertebral disc space narrowing with diffuse disc bulge. Associated endplate and uncovertebral spurring. Flattening and partial effacement of the ventral thecal sac, slightly asymmetric to the right. Right paracentral annular fissure noted. Mild spinal stenosis. Foramina remain patent. C4-C5: Shallow central disc  protrusion mildly indents the ventral thecal sac. No significant spinal stenosis. Superimposed uncovertebral spurring without significant foraminal encroachment. C5-C6: Degenerative intervertebral disc space narrowing with diffuse disc osteophyte complex, asymmetric to the right. Broad posterior component flattens and partially effaces the ventral thecal sac. Secondary cord flattening without cord signal changes. Mild spinal stenosis. Foramina remain patent. C6-C7: Degenerative intervertebral disc space narrowing with diffuse disc osteophyte complex. Broad posterior component flattens and partially effaces the ventral thecal sac, slightly asymmetric to the left. Mild spinal stenosis. Foramina remain adequately patent. C7-T1: Minimal disc bulge. Height loss with chronic endplate Schmorl's node deformity noted at the superior endplate of T10. Mild left-sided facet hypertrophy. No spinal stenosis. Foramina remain patent. IMPRESSION: 1. Moderate multilevel cervical spondylosis as detailed above. Resultant mild spinal stenosis at C3-4, C5-6, and C6-7. 2. No significant foraminal encroachment within the cervical spine. Electronically Signed   By: Rise Mu M.D.   On: 09/08/2022 15:59     Subjective: Patient was seen and examined at bedside.  Overnight events noted.   Patient report doing much better.  Rectal bleeding has resolved.  Patient is being discharged home  Discharge Exam: Vitals:   09/27/22 0437 09/27/22 0715  BP: 133/84 (!) 140/73  Pulse: (!) 56 62  Resp: 18 15  Temp: 98 F (36.7 C) 98.4 F (36.9 C)  SpO2: 97% 96%   Vitals:   09/26/22 1500 09/26/22 2021 09/27/22 0437 09/27/22 0715  BP:  (!) 149/93 133/84 (!) 140/73  Pulse:  (!) 58 (!) 56 62  Resp:  18 18 15   Temp:  98.9 F (37.2 C) 98 F (36.7 C) 98.4 F (36.9 C)  TempSrc:      SpO2:  100% 97% 96%  Weight: 102.1 kg     Height: 6\' 2"  (1.88 m)       General: Pt is alert, awake, not in acute distress Cardiovascular: RRR,  S1/S2 +, no rubs, no gallops Respiratory: CTA bilaterally, no wheezing, no rhonchi Abdominal: Soft, NT, ND, bowel sounds + Extremities: no edema, no cyanosis    The  results of significant diagnostics from this hospitalization (including imaging, microbiology, ancillary and laboratory) are listed below for reference.     Microbiology: Recent Results (from the past 240 hour(s))  C Difficile Quick Screen w PCR reflex     Status: None   Collection Time: 09/25/22  9:05 PM   Specimen: STOOL  Result Value Ref Range Status   C Diff antigen NEGATIVE NEGATIVE Final   C Diff toxin NEGATIVE NEGATIVE Final   C Diff interpretation No C. difficile detected.  Final    Comment: Performed at [facility], [address] [place], [place] [address]  Gastrointestinal Panel by PCR , Stool     Status: None   Collection Time: 09/25/22  9:55 PM  Result Value Ref Range Status   Campylobacter species NOT DETECTED NOT DETECTED Final   Plesimonas shigelloides NOT DETECTED NOT DETECTED Final   Salmonella species NOT DETECTED NOT DETECTED Final   Yersinia enterocolitica NOT DETECTED NOT DETECTED Final   Vibrio species NOT DETECTED NOT DETECTED Final   Vibrio cholerae NOT DETECTED NOT DETECTED Final   Enteroaggregative E coli (EAEC) NOT DETECTED NOT DETECTED Final   Enteropathogenic E coli (EPEC) NOT DETECTED NOT DETECTED Final   Enterotoxigenic E coli (ETEC) NOT DETECTED NOT DETECTED Final   Shiga like toxin producing E coli (STEC) NOT DETECTED NOT DETECTED Final   Shigella/Enteroinvasive E coli (EIEC) NOT DETECTED NOT DETECTED Final   Cryptosporidium NOT DETECTED NOT DETECTED Final   Cyclospora cayetanensis NOT DETECTED NOT DETECTED Final   Entamoeba histolytica NOT DETECTED NOT DETECTED Final   Giardia lamblia NOT DETECTED NOT DETECTED Final   Adenovirus F40/41 NOT DETECTED NOT DETECTED Final   Astrovirus NOT DETECTED NOT DETECTED Final   Norovirus GI/GII NOT DETECTED NOT DETECTED Final    Rotavirus A NOT DETECTED NOT DETECTED Final   Sapovirus (I, II, IV, and V) NOT DETECTED NOT DETECTED Final    Comment: Performed at [facility], [address] [place], [place] [address]     Labs: BNP (last 3 results) No results for input(s): "BNP" in the last 8760 hours. Basic Metabolic Panel: Recent Labs  Lab 09/25/22 1500 09/26/22 0446  NA 136 138  K 4.3 4.2  CL 104 103  CO2 18* 26  GLUCOSE 135* 103*  BUN 15 12  CREATININE 0.84 0.86  CALCIUM 9.6 8.8*  MG  --  2.2  PHOS  --  4.0   Liver Function Tests: Recent Labs  Lab 09/25/22 1500 09/26/22 0446  AST 30 18  ALT 23 18  ALKPHOS 79 66  BILITOT 0.8 0.8  PROT 7.6 6.3*  ALBUMIN 4.3 3.7   No results for input(s): "LIPASE", "AMYLASE" in the last 168 hours. No results for input(s): "AMMONIA" in the last 168 hours. CBC: Recent Labs  Lab 09/25/22 1500 09/26/22 0446 09/26/22 1400 09/27/22 0243  WBC 13.0* 7.7  --  6.5  HGB 17.9* 14.9 15.9 14.9  HCT 51.7 44.2 47.7 43.1  MCV 86.6 87.0  --  85.5  PLT 250 215  --  197   Cardiac Enzymes: No results for input(s): "CKTOTAL", "CKMB", "CKMBINDEX", "TROPONINI" in the last 168 hours. BNP: Invalid input(s): "POCBNP" CBG: No results for input(s): "GLUCAP" in the last 168 hours. D-Dimer No results for input(s): "DDIMER" in the last 72 hours. Hgb A1c No results for input(s): "HGBA1C" in the last 72 hours. Lipid Profile No results for input(s): "CHOL", "HDL", "LDLCALC", "TRIG", "CHOLHDL", "LDLDIRECT" in the last 72 hours. Thyroid  function studies No results for input(s): "TSH", "T4TOTAL", "T3FREE", "THYROIDAB" in the last 72 hours.  Invalid input(s): "FREET3" Anemia work up No results for input(s): "VITAMINB12", "FOLATE", "FERRITIN", "TIBC", "IRON", "RETICCTPCT" in the last 72 hours. Urinalysis    Component Value Date/Time   COLORURINE YELLOW (A) 09/25/2022 1721   APPEARANCEUR CLEAR (A) 09/25/2022 1721   APPEARANCEUR Clear 01/23/2013 1235   LABSPEC >1.046  (H) 09/25/2022 1721   LABSPEC 1.010 01/23/2013 1235   PHURINE 6.0 09/25/2022 1721   GLUCOSEU NEGATIVE 09/25/2022 1721   GLUCOSEU Negative 01/23/2013 1235   HGBUR NEGATIVE 09/25/2022 1721   BILIRUBINUR NEGATIVE 09/25/2022 1721   BILIRUBINUR Negative 01/23/2013 1235   KETONESUR NEGATIVE 09/25/2022 1721   PROTEINUR NEGATIVE 09/25/2022 1721   NITRITE NEGATIVE 09/25/2022 1721   LEUKOCYTESUR NEGATIVE 09/25/2022 1721   LEUKOCYTESUR Negative 01/23/2013 1235   Sepsis Labs Recent Labs  Lab 09/25/22 1500 09/26/22 0446 09/27/22 0243  WBC 13.0* 7.7 6.5   Microbiology Recent Results (from the past 240 hour(s))  C Difficile Quick Screen w PCR reflex     Status: None   Collection Time: 09/25/22  9:05 PM   Specimen: STOOL  Result Value Ref Range Status   C Diff antigen NEGATIVE NEGATIVE Final   C Diff toxin NEGATIVE NEGATIVE Final   C Diff interpretation No C. difficile detected.  Final    Comment: Performed at [facility] [facility], [address] [place], [place] [address]  Gastrointestinal Panel by PCR , Stool     Status: None   Collection Time: 09/25/22  9:55 PM  Result Value Ref Range Status   Campylobacter species NOT DETECTED NOT DETECTED Final   Plesimonas shigelloides NOT DETECTED NOT DETECTED Final   Salmonella species NOT DETECTED NOT DETECTED Final   Yersinia enterocolitica NOT DETECTED NOT DETECTED Final   Vibrio species NOT DETECTED NOT DETECTED Final   Vibrio cholerae NOT DETECTED NOT DETECTED Final   Enteroaggregative E coli (EAEC) NOT DETECTED NOT DETECTED Final   Enteropathogenic E coli (EPEC) NOT DETECTED NOT DETECTED Final   Enterotoxigenic E coli (ETEC) NOT DETECTED NOT DETECTED Final   Shiga like toxin producing E coli (STEC) NOT DETECTED NOT DETECTED Final   Shigella/Enteroinvasive E coli (EIEC) NOT DETECTED NOT DETECTED Final   Cryptosporidium NOT DETECTED NOT DETECTED Final   Cyclospora cayetanensis NOT DETECTED NOT DETECTED Final   Entamoeba histolytica NOT  DETECTED NOT DETECTED Final   Giardia lamblia NOT DETECTED NOT DETECTED Final   Adenovirus F40/41 NOT DETECTED NOT DETECTED Final   Astrovirus NOT DETECTED NOT DETECTED Final   Norovirus GI/GII NOT DETECTED NOT DETECTED Final   Rotavirus A NOT DETECTED NOT DETECTED Final   Sapovirus (I, II, IV, and V) NOT DETECTED NOT DETECTED Final    Comment: Performed at [facility], [address] [place], [place] [address]     Time coordinating discharge: Over 30 minutes  SIGNED:   Willeen Niece, MD  [facility] 09/27/2022, 1:58 PM Pager   If 7PM-7AM, please contact night-coverage

## 2022-09-27 NOTE — Consult Note (Signed)
ANTICOAGULATION CONSULT NOTE - Initial Consult  Pharmacy Consult for heparin Indication: pulmonary embolus  No Known Allergies  Patient Measurements: Height: 6\' 2"  (188 cm) Weight: 102.1 kg (225 lb 1.4 oz) IBW/kg (Calculated) : 82.2 Heparin Dosing Weight: 98.9 kg  Vital Signs: Temp: 98.9 F (37.2 C) (04/13 2021) BP: 149/93 (04/13 2021) Pulse Rate: 58 (04/13 2021)  Labs: Recent Labs    09/25/22 1500 09/26/22 0446 09/26/22 1400 09/26/22 1438 09/26/22 2111 09/27/22 0243  HGB 17.9* 14.9 15.9  --   --  14.9  HCT 51.7 44.2 47.7  --   --  43.1  PLT 250 215  --   --   --  197  APTT  --   --   --  >200* 73* 88*  LABPROT 13.0  --   --  15.6*  --   --   INR 1.0  --   --  1.3*  --   --   HEPARINUNFRC  --   --   --  >1.10* >1.10* >1.10*  CREATININE 0.84 0.86  --   --   --   --      Estimated Creatinine Clearance: 132.6 mL/min (by C-G formula based on SCr of 0.86 mg/dL).   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 CTAP c/f colitis. Patient endorses he has been taking over 10 naproxen tabs a day in addition to [facility] powder for over 10 years for neck issues. Patient also takes Eliquis which was started in January 2024 empirically for PE, and imaging at that time revealed pulmonary vein thrombus. Last dose of apixaban on 4/12 @ 2114 while here at [facility]. Hgb is stable at 14.9.  Baseline Labs: aPTT - ordered; INR - ordered Hgb - 14.9; Plts - 215  Goal of Therapy:  Heparin level 0.3-0.7  units/ml Monitor platelets by anticoagulation protocol: Yes   Plan:  Last dose of apixaban on 4/12 @ 2114 Titrate by aPTT's until lab correlation is noted, then titrate by anti-xa alone. 4/14 @ 0243: aPTT = 88, HL = > 1.10 aPTT therapeutic X 2 , HL still elevated from Eliquis PTA Will continue pt on current rate and recheck aPTT and HL on 4/15 with AM labs.  Continue to monitor H&H and platelets daily while on heparin gtt.  Flannery Cavallero D 09/27/2022 3:36 AM

## 2022-09-27 NOTE — Progress Notes (Signed)
Jake Elliott , MD [address], [place], [place], [address] [address], [address], [place], [place], [address] Phone: [phone]  Fax: [phone]   Kordell Franczak is being followed for GI bleed/colitis  Day 1 of follow up   Subjective: Toleratinmg diet , lower abdominal discomfort, passing brown stool with no blood. Still liquid. Wants to go home.    Objective: Vital signs in last 24 hours: Vitals:   09/26/22 1500 09/26/22 2021 09/27/22 0437 09/27/22 0715  BP:  (!) 149/93 133/84 (!) 140/73  Pulse:  (!) 58 (!) 56 62  Resp:  18 18 15   Temp:  98.9 F (37.2 C) 98 F (36.7 C) 98.4 F (36.9 C)  TempSrc:      SpO2:  100% 97% 96%  Weight: 102.1 kg     Height: 6\' 2"  (1.88 m)      Weight change: 0.041 kg  Intake/Output Summary (Last 24 hours) at 09/27/2022 1101 Last data filed at 09/27/2022 1044 Gross per 24 hour  Intake 1471.31 ml  Output --  Net 1471.31 ml     Exam:  Abdomen: soft, nontender, normal bowel sounds   Lab Results: @LABTEST2 @ Micro Results: Recent Results (from the past 240 hour(s))  C Difficile Quick Screen w PCR reflex     Status: None   Collection Time: 09/25/22  9:05 PM   Specimen: STOOL  Result Value Ref Range Status   C Diff antigen NEGATIVE NEGATIVE Final   C Diff toxin NEGATIVE NEGATIVE Final   C Diff interpretation No C. difficile detected.  Final    Comment: Performed at [facility], [address] [place], [place] [address]  Gastrointestinal Panel by PCR , Stool     Status: None   Collection Time: 09/25/22  9:55 PM  Result Value Ref Range Status   Campylobacter species NOT DETECTED NOT DETECTED Final   Plesimonas shigelloides NOT DETECTED NOT DETECTED Final   Salmonella species NOT DETECTED NOT DETECTED Final   Yersinia enterocolitica NOT DETECTED NOT DETECTED Final   Vibrio species NOT DETECTED NOT DETECTED Final   Vibrio cholerae NOT DETECTED NOT DETECTED Final   Enteroaggregative E coli (EAEC) NOT DETECTED  NOT DETECTED Final   Enteropathogenic E coli (EPEC) NOT DETECTED NOT DETECTED Final   Enterotoxigenic E coli (ETEC) NOT DETECTED NOT DETECTED Final   Shiga like toxin producing E coli (STEC) NOT DETECTED NOT DETECTED Final   Shigella/Enteroinvasive E coli (EIEC) NOT DETECTED NOT DETECTED Final   Cryptosporidium NOT DETECTED NOT DETECTED Final   Cyclospora cayetanensis NOT DETECTED NOT DETECTED Final   Entamoeba histolytica NOT DETECTED NOT DETECTED Final   Giardia lamblia NOT DETECTED NOT DETECTED Final   Adenovirus F40/41 NOT DETECTED NOT DETECTED Final   Astrovirus NOT DETECTED NOT DETECTED Final   Norovirus GI/GII NOT DETECTED NOT DETECTED Final   Rotavirus A NOT DETECTED NOT DETECTED Final   Sapovirus (I, II, IV, and V) NOT DETECTED NOT DETECTED Final    Comment: Performed at [facility], [address] [place], [place] [address]   Studies/Results: CT ANGIO GI BLEED  Result Date: 09/25/2022 CLINICAL DATA:  Mesenteric ischemia EXAM: CTA ABDOMEN AND PELVIS WITHOUT AND WITH CONTRAST TECHNIQUE: Multidetector CT imaging of the abdomen and pelvis was performed using the standard protocol during bolus administration of intravenous contrast. Multiplanar reconstructed images and MIPs were obtained and reviewed to evaluate the vascular anatomy. RADIATION DOSE REDUCTION: This exam was performed according to the departmental dose-optimization program which  includes automated exposure control, adjustment of the mA and/or kV according to patient size and/or use of iterative reconstruction technique. CONTRAST:  OMNIPAQUE IOHEXOL 350 MG/ML SOLN COMPARISON:  CT chest angiogram, 07/06/2022 FINDINGS: VASCULAR Normal contour and caliber of the abdominal aorta. No evidence of aneurysm, dissection, or other acute aortic pathology. Standard branching pattern of the abdominal aorta, with solitary bilateral renal arteries. Moderate mixed calcific atherosclerosis. The branch vessel origins are widely  patent, including the superior mesenteric artery and its proximal order branches. Review of the MIP images confirms the above findings. NON-VASCULAR Lower chest: No acute abnormality. Hepatobiliary: No solid liver abnormality is seen. No gallstones, gallbladder wall thickening, or biliary dilatation. Pancreas: Unremarkable. No pancreatic ductal dilatation or surrounding inflammatory changes. Spleen: Normal in size without significant abnormality. Adrenals/Urinary Tract: Adrenal glands are unremarkable. Kidneys are normal, without renal calculi, solid lesion, or hydronephrosis. Bladder is unremarkable. Stomach/Bowel: Stomach is within normal limits. Appendix appears normal. Long segment wall thickening mucosal hyperenhancement of the descending colon, with focal fat stranding about the mid descending colon (series 9, image 87). Sigmoid diverticulosis. Lymphatic: No enlarged abdominal or pelvic lymph nodes. Reproductive: No mass or other significant abnormality. Other: No abdominal wall hernia or abnormality. No ascites. Musculoskeletal: No acute or significant osseous findings. IMPRESSION: 1. Normal contour and caliber of the abdominal aorta. No evidence of aneurysm, dissection, or other acute aortic pathology. Moderate mixed calcific atherosclerosis. The branch vessel origins are widely patent, including the superior mesenteric artery and its proximal order branches. 2. Long segment wall thickening and mucosal hyperenhancement of the descending colon, with focal fat stranding about the mid descending colon. Findings are consistent with nonspecific infectious, inflammatory, or ischemic colitis. 3. Sigmoid diverticulosis without evidence of acute diverticulitis. Note that diverticulosis of the sigmoid colon does not appear to be the nidus of inflammation described above. Aortic Atherosclerosis (ICD10-I70.0). Electronically Signed   By: Jearld Lesch M.D.   On: 09/25/2022 17:08   Medications: I have reviewed the  patient's current medications. Scheduled Meds:  docusate sodium  100 mg Oral BID   melatonin  10 mg Oral QHS   metoprolol tartrate  25 mg Oral BID   nicotine  21 mg Transdermal Daily   rosuvastatin  10 mg Oral Daily   Continuous Infusions:  sodium chloride 75 mL/hr at 09/27/22 6578   ciprofloxacin 400 mg (09/27/22 1050)   heparin 1,650 Units/hr (09/27/22 1044)   metronidazole Stopped (09/27/22 1044)   PRN Meds:.acetaminophen **OR** acetaminophen, albuterol, HYDROmorphone (DILAUDID) injection, loperamide, ondansetron **OR** ondansetron (ZOFRAN) IV, mouth rinse   Assessment: Principal Problem:   Infectious colitis, enteritis and gastroenteritis Active Problems:   HOCM (hypertrophic obstructive cardiomyopathy)   OSA (obstructive sleep apnea)   Essential hypertension   Peripheral autonomic neuropathy of unknown cause   Affective bipolar disorder   Chronic pain syndrome   Attention deficit hyperactivity disorder (ADHD), predominantly inattentive type   Tobacco use disorder   Colitis  Ferrel Simington is a 52 y.o. y/o male admitted with 1-2 days history of lower abdominal pain , associated with bright red blood per rectum ,  CT angiogram shows colitis in the descending colon : negative stool tests for infection. He has been taking over 10 alleves a day in addition to [facility] powders for over 10 years for neck pain which has likely cause nsaid related colitis. Hb stable overnight at 14.9 grams    Plan  Clinically he has stopped bleeding , brown stools.  Likely diarrhea will take a few  days to resolve due to inflammation  When goes home dc on prilosec 40 mg BID for 4 weeks to heal any ulcers  Advised to stop al;l NSAIDS Follow up with PCP Does not require GI follow up unless his symptoms do not resolve or worsen  Can resume Eloquis and has been instructed if bleeding returns to contact pcp/ER  I will sign off.  Please call me if any further GI concerns or questions.  We would like to thank  you for the opportunity to participate in the care of Noe Goyer.     LOS: 2 days   Jake Mood, MD 09/27/2022, 11:01 AM
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
## 2022-09-29 ENCOUNTER — Other Ambulatory Visit: Payer: Self-pay | Admitting: Family Medicine

## 2022-09-29 ENCOUNTER — Encounter: Payer: Self-pay | Admitting: Orthopedic Surgery

## 2022-09-29 ENCOUNTER — Ambulatory Visit (INDEPENDENT_AMBULATORY_CARE_PROVIDER_SITE_OTHER): Payer: Medicaid Other | Admitting: Orthopedic Surgery

## 2022-09-29 VITALS — BP 130/82 | Ht 74.0 in | Wt 234.8 lb

## 2022-09-29 DIAGNOSIS — M47812 Spondylosis without myelopathy or radiculopathy, cervical region: Secondary | ICD-10-CM

## 2022-09-29 DIAGNOSIS — M5412 Radiculopathy, cervical region: Secondary | ICD-10-CM

## 2022-09-29 DIAGNOSIS — M4724 Other spondylosis with radiculopathy, thoracic region: Secondary | ICD-10-CM | POA: Diagnosis not present

## 2022-09-29 DIAGNOSIS — M4722 Other spondylosis with radiculopathy, cervical region: Secondary | ICD-10-CM

## 2022-09-29 DIAGNOSIS — M5416 Radiculopathy, lumbar region: Secondary | ICD-10-CM

## 2022-09-29 DIAGNOSIS — M5030 Other cervical disc degeneration, unspecified cervical region: Secondary | ICD-10-CM | POA: Diagnosis not present

## 2022-09-29 DIAGNOSIS — G8929 Other chronic pain: Secondary | ICD-10-CM | POA: Diagnosis not present

## 2022-09-29 DIAGNOSIS — M4726 Other spondylosis with radiculopathy, lumbar region: Secondary | ICD-10-CM

## 2022-09-29 DIAGNOSIS — M47816 Spondylosis without myelopathy or radiculopathy, lumbar region: Secondary | ICD-10-CM

## 2022-09-29 MED ORDER — METRONIDAZOLE 500 MG PO TABS: 500.0000 mg | ORAL_TABLET | Freq: Three times a day (TID) | ORAL | 0 refills | Status: AC

## 2022-09-29 NOTE — Patient Instructions (Signed)
It was so nice to see you today. Thank you so much for coming in.    Your imaging shows some wear and tear in your neck, mid back, and lower back.   Dr. Myer Haff reviewed all of your imaging and does not recommend surgery for your spine.   I want you to see pain management here in [place] (Dr. Cherylann Ratel) to discuss possible spine injections. They should call you to schedule an appointment or you can call them at [phone].   Please do not hesitate to call if you have any questions or concerns. You can also message me in MyChart.   Drake Leach PA-C [phone]

## 2022-10-01 ENCOUNTER — Telehealth: Payer: Self-pay | Admitting: Orthopedic Surgery

## 2022-10-01 ENCOUNTER — Telehealth: Payer: Self-pay | Admitting: Pulmonary Disease

## 2022-10-01 ENCOUNTER — Encounter: Payer: Self-pay | Admitting: Pulmonary Disease

## 2022-10-01 DIAGNOSIS — M5030 Other cervical disc degeneration, unspecified cervical region: Secondary | ICD-10-CM

## 2022-10-01 DIAGNOSIS — M5416 Radiculopathy, lumbar region: Secondary | ICD-10-CM

## 2022-10-01 DIAGNOSIS — M5412 Radiculopathy, cervical region: Secondary | ICD-10-CM

## 2022-10-01 DIAGNOSIS — M546 Pain in thoracic spine: Secondary | ICD-10-CM

## 2022-10-01 DIAGNOSIS — M47812 Spondylosis without myelopathy or radiculopathy, cervical region: Secondary | ICD-10-CM

## 2022-10-01 DIAGNOSIS — M47816 Spondylosis without myelopathy or radiculopathy, lumbar region: Secondary | ICD-10-CM

## 2022-10-01 DIAGNOSIS — G8929 Other chronic pain: Secondary | ICD-10-CM

## 2022-10-01 NOTE — Telephone Encounter (Signed)
See MyChart message

## 2022-10-01 NOTE — Addendum Note (Signed)
Addended byDrake Leach on: 10/01/2022 03:53 PM   Modules accepted: Orders

## 2022-10-01 NOTE — Telephone Encounter (Signed)
Patient is calling. He is trying to get in to any pain management office ASAP. Dr.Lateef can not get him in soon enough. Patient can not take any OTC medication. Do you have any suggestions of where he can be seen quickly? You can call him or send him a message through [place].

## 2022-10-01 NOTE — Telephone Encounter (Signed)
52 year old male with a history of multilevel level degenerative disc disease who initially presented to the pulmonary clinic for evaluation of pulmonary embolus on chronic apixaban therapy who calls for evaluation of pain.  He has multilevel degenerative disc disease that is currently being evaluated and managed by neurosurgery PA Luna.  He is requesting something, anything for pain.  He states that he is willing to discontinue his apixaban so that he can reinitiate ibuprofen/NSAID therapy.  I recommended that this would be ill-advised and that he should maintain his apixaban for the time being.  I talked him about initiating acetaminophen therapy on a scheduled basis which has been ineffective for him in the past.  I recommended that if he is unable to reach adequate pain relief and this is obstructing his day-to-day, he would need to seek in person care for better evaluation.  I would be unable to prescribe any narcotic medications for management of his pain.  It seems like he has been referred to pain management and needs something to tide him over to his appointments.  I advised that if this is intolerable, seek emergency care.  He was understanding and appreciative of this.

## 2022-10-01 NOTE — Telephone Encounter (Signed)
Spoke with patient. He is able to travel to [place] if needed.   Pain management referral put in for medication management and possible spinal injections. Please send to:  [facility] in [facility] in [place]  He also needs to get established with a PCP. I put in referral. Please send to [facility] on [address].   MyChart message sent to him with above information and contact numbers.

## 2022-10-01 NOTE — Telephone Encounter (Signed)
Message noted.   See notes from earlier today.

## 2022-10-01 NOTE — Telephone Encounter (Signed)
Fax sent from [facility] stating: Patient declined appointment that was originally scheduled for 10/13/2022. Our physicians are not taking Medication Management at this time

## 2022-10-01 NOTE — Telephone Encounter (Signed)
Referral faxed to all 3 places listed to see who can see him the soonest.

## 2022-10-02 ENCOUNTER — Telehealth: Payer: Self-pay | Admitting: Orthopedic Surgery

## 2022-10-02 DIAGNOSIS — M5416 Radiculopathy, lumbar region: Secondary | ICD-10-CM

## 2022-10-02 DIAGNOSIS — M5412 Radiculopathy, cervical region: Secondary | ICD-10-CM

## 2022-10-02 DIAGNOSIS — G8929 Other chronic pain: Secondary | ICD-10-CM

## 2022-10-02 DIAGNOSIS — M47816 Spondylosis without myelopathy or radiculopathy, lumbar region: Secondary | ICD-10-CM

## 2022-10-02 DIAGNOSIS — M5030 Other cervical disc degeneration, unspecified cervical region: Secondary | ICD-10-CM

## 2022-10-02 DIAGNOSIS — M47812 Spondylosis without myelopathy or radiculopathy, cervical region: Secondary | ICD-10-CM

## 2022-10-02 DIAGNOSIS — M546 Pain in thoracic spine: Secondary | ICD-10-CM

## 2022-10-02 MED ORDER — CYCLOBENZAPRINE HCL 10 MG PO TABS
10.0000 mg | ORAL_TABLET | Freq: Three times a day (TID) | ORAL | 0 refills | Status: DC | PRN
Start: 2022-10-02 — End: 2022-10-27

## 2022-10-02 NOTE — Telephone Encounter (Signed)
Called patient to see if he wanted [place] to send pain referral to [facility] pain management- he saw them years ago.   He can't make the drive to [facility] due to transportation issues.   He has seen improvement with muscle relaxers in the past. Will send flexeril into his pharmacy.   He has started taken the herb "ivy" to help with inflammation. Recommend he discuss this with pharmacist to make sure it does not interact with any of his other medications especially his eliquis.

## 2022-10-03 ENCOUNTER — Emergency Department
Admission: EM | Admit: 2022-10-03 | Discharge: 2022-10-03 | Disposition: A | Discharge status: Home / Self Care | Payer: Medicaid Other | Attending: Emergency Medicine | Admitting: Emergency Medicine

## 2022-10-03 ENCOUNTER — Telehealth: Payer: Self-pay | Admitting: Pulmonary Disease

## 2022-10-03 ENCOUNTER — Other Ambulatory Visit: Payer: Self-pay

## 2022-10-03 DIAGNOSIS — G8929 Other chronic pain: Secondary | ICD-10-CM | POA: Diagnosis not present

## 2022-10-03 DIAGNOSIS — M549 Dorsalgia, unspecified: Secondary | ICD-10-CM | POA: Diagnosis present

## 2022-10-03 DIAGNOSIS — I10 Essential (primary) hypertension: Secondary | ICD-10-CM | POA: Diagnosis not present

## 2022-10-03 DIAGNOSIS — Z7901 Long term (current) use of anticoagulants: Secondary | ICD-10-CM | POA: Insufficient documentation

## 2022-10-03 DIAGNOSIS — M545 Low back pain, unspecified: Secondary | ICD-10-CM | POA: Diagnosis not present

## 2022-10-03 NOTE — ED Triage Notes (Signed)
Pt to ED via POV from home. Pt reports chronic back pain. Pt was placed on Eliquis for blood clots. Pt had a GI bleed last week and was told to not take ibuprofen/NSAID therapy. Pt reports now is hard to manage pain. Pt reports some relief. Pt wanting pain management

## 2022-10-03 NOTE — Telephone Encounter (Signed)
52 year old male that called into the pulmonary on-call service line requesting to stop his apixaban.  He has a history of multilevel degenerative disc disease and is being followed by neurosurgery.  He was recently prescribed muscle relaxers which he felt were helpful for several hours but ultimately did not resolve the underlying pain.  He has a recent GI bleed with cratered ulcer and previously used large doses of NSAIDs throughout the day for 8-10 years.  He was diagnosed with a pulmonary embolus and started on apixaban roughly 2 months ago.  He has been seeking pain medications for alleviation of his degenerative disc disease and has not been able to find relief without NSAIDs since his recent GI bleed.  He has failed therapy with acetaminophen, has failed therapy with muscle relaxers, and recently went to the emergency department today where he left prematurely because no narcotic medications were prescribed.  He states that he will be stopping his apixaban so that he can resume his NSAID therapy.  I advised him not to do so, we discussed that stopping Eliquis could lead to a prothrombotic state, worsens pulmonary embolus, and even lead to cardiac arrest.  He states that the pain is intolerable and he needs to resume NSAIDs.  I advised him that in the setting of his recent gastric ulceration, NSAIDs could be potentially life-threatening regardless of concurrent anticoagulation-she states that he is willing to take the risk and that this was never a problem before he was placed on apixaban.  Ultimately, I feel that the patient's pain is poorly controlled, but stopping his apixaban is likely to result in poor outcomes.  Additionally, initiation of NSAID therapy is is ill-advised.  Despite this, he has been unable to seek adequate alternative pain control and is adamant that he will be stopping his apixaban to resume his NSAID therapy.

## 2022-10-03 NOTE — ED Provider Notes (Signed)
[facility] Provider Note    Event Date/Time   First MD Initiated Contact with Patient 10/03/22 1822     (approximate)   History   Chief Complaint Back Pain and Pain Management   HPI  Dohn Stclair is a 52 y.o. male with past medical history of hypertension, HOCM, PE on Eliquis, and bipolar disorder who presents to the ED complaining of back pain.  Patient reports that he has been dealing with pain throughout his entire back for multiple years, states he typically takes ibuprofen or Goody's powder for the pain, but has been unable to take these medications since being started on Eliquis for PE recently.  He states he has been compliant with the Eliquis, denies any new neurologic symptoms.  He recently followed up with neurosurgery for MRIs of his neck and back, was told he was not a surgical candidate and referred to pain management.  Patient states that he canceled his pain management appointment after he was told they would offer only injections rather than pain medication.  He now presents to the ED requesting assistance with pain management.     Physical Exam   Triage Vital Signs: ED Triage Vitals  Enc Vitals Group     BP 10/03/22 1704 (!) 145/79     Pulse Rate 10/03/22 1703 84     Resp 10/03/22 1703 16     Temp 10/03/22 1703 98.4 F (36.9 C)     Temp Source 10/03/22 1703 Oral     SpO2 10/03/22 1703 98 %     Weight 10/03/22 1703 234 lb (106.1 kg)     Height 10/03/22 1703  (1.803 m)     Head Circumference --      Peak Flow --      Pain Score 10/03/22 1709 7     Pain Loc --      Pain Edu? --      Excl. in GC? --     Most recent vital signs: Vitals:   10/03/22 1703 10/03/22 1704  BP:  (!) 145/79  Pulse: 84   Resp: 16   Temp: 98.4 F (36.9 C)   SpO2: 98%     Constitutional: Alert and oriented. Eyes: Conjunctivae are normal. Head: Atraumatic. Nose: No congestion/rhinnorhea. Mouth/Throat: Mucous membranes are moist.   Cardiovascular: Normal rate, regular rhythm. Grossly normal heart sounds.  2+ radial pulses bilaterally. Respiratory: Normal respiratory effort.  No retractions. Lungs CTAB. Gastrointestinal: Soft and nontender. No distention. Musculoskeletal: No lower extremity tenderness nor edema.  Neurologic:  Normal speech and language. No gross focal neurologic deficits are appreciated, ambulatory without difficulty.    ED Results / Procedures / Treatments   Labs (all labs ordered are listed, but only abnormal results are displayed) Labs Reviewed - No data to display   PROCEDURES:  Critical Care performed: No  Procedures   MEDICATIONS ORDERED IN ED: Medications - No data to display   IMPRESSION / MDM / ASSESSMENT AND PLAN / ED COURSE  I reviewed the triage vital signs and the nursing notes.                              52 y.o. male with past medical history of hypertension, HOCM, PE on Eliquis, and bipolar disorder who presents to the ED complaining of ongoing chronic back pain throughout his entire back.  Patient's presentation is most consistent with acute, uncomplicated illness.  Differential  diagnosis includes, but is not limited to, chronic back pain, cauda equina, lumbar strain, radiculopathy, medication seeking behavior.  Patient nontoxic-appearing and in no acute distress, vital signs are unremarkable.  He is ambulatory without difficulty, has no apparent neurologic deficits and no findings concerning for cauda equina.  Back pain appears to be a chronic issue for him and he had MRIs performed last month.  He followed up with neurosurgery with these results, deemed not to be a surgical candidate and referred to pain management.  He canceled his pain management appointment when he was told that they would not prescribe him medication.  He was again informed here in the ED that we would not be prescribing narcotic pain medication, was offered nonnarcotic management with Lidoderm  patches, Tylenol, and muscle relaxant.  I encouraged him to reschedule his appointment with pain management to consider PT and injections, but he became upset at this and walked out of the emergency department.  No findings concerning for cauda equina or other emergent process at this time.      FINAL CLINICAL IMPRESSION(S) / ED DIAGNOSES   Final diagnoses:  Chronic midline back pain, unspecified back location     Rx / DC Orders   ED Discharge Orders     None        Note:  This document was prepared using Dragon voice recognition software and may include unintentional dictation errors.   Chesley Noon, MD 10/03/22 Mikle Bosworth

## 2022-10-05 NOTE — Telephone Encounter (Signed)
Please see attached. Do you have any other recommendations?

## 2022-10-09 ENCOUNTER — Ambulatory Visit
Admission: RE | Admit: 2022-10-09 | Discharge: 2022-10-09 | Disposition: A | Discharge status: Home / Self Care | Payer: Medicaid Other | Source: Ambulatory Visit | Attending: Student in an Organized Health Care Education/Training Program | Admitting: Student in an Organized Health Care Education/Training Program

## 2022-10-09 DIAGNOSIS — I2699 Other pulmonary embolism without acute cor pulmonale: Secondary | ICD-10-CM

## 2022-10-09 DIAGNOSIS — R911 Solitary pulmonary nodule: Secondary | ICD-10-CM | POA: Diagnosis not present

## 2022-10-09 MED ORDER — IOHEXOL 350 MG/ML SOLN
75.0000 mL | Freq: Once | INTRAVENOUS | Status: AC | PRN
  Administered 2022-10-09: 75 mL via INTRAVENOUS

## 2022-10-12 ENCOUNTER — Ambulatory Visit (INDEPENDENT_AMBULATORY_CARE_PROVIDER_SITE_OTHER): Payer: Medicaid Other | Admitting: Student in an Organized Health Care Education/Training Program

## 2022-10-12 ENCOUNTER — Encounter: Payer: Self-pay | Admitting: Student in an Organized Health Care Education/Training Program

## 2022-10-12 VITALS — BP 124/70 | HR 82 | Temp 98.0°F | Ht 71.0 in | Wt 242.2 lb

## 2022-10-12 DIAGNOSIS — I2699 Other pulmonary embolism without acute cor pulmonale: Secondary | ICD-10-CM

## 2022-10-12 NOTE — Progress Notes (Unsigned)
Synopsis: Referred in *** by No ref. provider found  Assessment & Plan:   There are no diagnoses linked to this encounter.  Return if symptoms worsen or fail to improve.  I spent *** minutes caring for this patient today, including {EM billing:[id number]}  [name], MD [facility] 10/12/2022 10:57 PM    End of visit medications:  No orders of the defined types were placed in this encounter.    Current Outpatient Medications:    albuterol (VENTOLIN HFA) 108 (90 Base) MCG/ACT inhaler, Inhale 2 puffs into the lungs every 6 (six) hours as needed for wheezing or shortness of breath., Disp: 8 g, Rfl: 2   apixaban (ELIQUIS) 5 MG TABS tablet, Take 1 tablet (5 mg total) by mouth 2 (two) times daily., Disp: 60 tablet, Rfl: 2   cyclobenzaprine (FLEXERIL) 10 MG tablet, Take 1 tablet (10 mg total) by mouth 3 (three) times daily as needed for muscle spasms. This can make you sleepy., Disp: 90 tablet, Rfl: 0   diphenhydrAMINE (BENADRYL) 25 MG tablet, Take 3 tablets (75 mg total) by mouth at bedtime as needed., Disp: 90 tablet, Rfl: 1   metoprolol tartrate (LOPRESSOR) 25 MG tablet, Take 1 tablet (25 mg total) by mouth 2 (two) times daily., Disp: 180 tablet, Rfl: 3   pantoprazole (PROTONIX) 40 MG tablet, Take 1 tablet (40 mg total) by mouth 2 (two) times daily for 28 days., Disp: 56 tablet, Rfl: 0   tadalafil (CIALIS) 20 MG tablet, Take 1 tablet (20 mg total) by mouth daily as needed for erectile dysfunction., Disp: 30 tablet, Rfl: 6   Subjective:   PATIENT ID: Jake Elliott GENDER: male DOB: 17-Jan-1971, MRN: [id number]  Chief Complaint  Patient presents with   Follow-up    HPI  Would like to stop eliquis Was seen in ED for ?GI bleed Was on a lot of NSAIDS, knows he can't take both because of risk of bleeding Explained risks and benefits, he would like to cease taking eliquis   Ancillary information including prior medications, full  medical/surgical/family/social histories, and PFTs (when available) are listed below and have been reviewed.   ROS   Objective:   Vitals:   10/12/22 1510  BP: 124/70  Pulse: 82  Temp: 98 F (36.7 C)  TempSrc: Oral  SpO2: 98%  Weight: 242 lb 3.2 oz (109.9 kg)  Height: 5\' 11"  (1.803 m)   98% on *** LPM *** RA BMI Readings from Last 3 Encounters:  10/12/22 33.78 kg/m  10/03/22 32.64 kg/m  09/29/22 30.15 kg/m   Wt Readings from Last 3 Encounters:  10/12/22 242 lb 3.2 oz (109.9 kg)  10/03/22 234 lb (106.1 kg)  09/29/22 234 lb 12.8 oz (106.5 kg)    Physical Exam    Ancillary Information    Past Medical History:  Diagnosis Date   Angina at rest    Anxiety    Attention-deficit/hyperactivity disorder    Bipolar disorder (HCC)    Depression    History of tobacco abuse    Hypertension    Hypertrophic cardiomyopathy (HCC)    s/p myomectomy   Peripheral neuropathy    PTSD (post-traumatic stress disorder)      Family History  Problem Relation Age of Onset   Cancer - Lung Mother    Skin cancer Mother    Alcohol abuse Mother    Drug abuse Mother    Stroke Father    Hypertension Father    Alcohol abuse Father  Drug abuse Father    Drug abuse Sister      Past Surgical History:  Procedure Laterality Date   CARDIAC CATHETERIZATION     11/14/2010   CORONARY ARTERY BYPASS GRAFT     for myomectomy for hypertrophic cardiomyopathy    Social History   Socioeconomic History   Marital status: Single    Spouse name: Not on file   Number of children: Not on file   Years of education: Not on file   Highest education level: Not on file  Occupational History   Not on file  Tobacco Use   Smoking status: Every Day    Packs/day: 2.00    Years: 15.00    Additional pack years: 0.00    Total pack years: 30.00    Types: Cigarettes    Start date: 12/31/1988   Smokeless tobacco: Never   Tobacco comments:    1 PPD (currently smoking)  Vaping Use   Vaping Use:  Never used  Substance and Sexual Activity   Alcohol use: No    Alcohol/week: 0.0 standard drinks of alcohol   Drug use: No   Sexual activity: Yes    Partners: Female    Birth control/protection: None  Other Topics Concern   Not on file  Social History Narrative   Not on file   Social Determinants of Health   Financial Resource Strain: Not on file  Food Insecurity: No Food Insecurity (09/26/2022)   Hunger Vital Sign    Worried About Running Out of Food in the Last Year: Never true    Ran Out of Food in the Last Year: Never true  Transportation Needs: No Transportation Needs (09/26/2022)   PRAPARE - Administrator, Civil Service (Medical): No    Lack of Transportation (Non-Medical): No  Physical Activity: Not on file  Stress: Not on file  Social Connections: Not on file  Intimate Partner Violence: Not At Risk (09/26/2022)   Humiliation, Afraid, Rape, and Kick questionnaire    Fear of Current or Ex-Partner: No    Emotionally Abused: No    Physically Abused: No    Sexually Abused: No     No Known Allergies   CBC    Component Value Date/Time   WBC 6.5 09/27/2022 0243   RBC 5.04 09/27/2022 0243   HGB 14.9 09/27/2022 0243   HGB CANCELED 07/05/2019 1530   HCT 43.1 09/27/2022 0243   HCT CANCELED 07/05/2019 1530   PLT 197 09/27/2022 0243   PLT CANCELED 07/05/2019 1530   MCV 85.5 09/27/2022 0243   MCV 82 06/14/2013 2021   MCH 29.6 09/27/2022 0243   MCHC 34.6 09/27/2022 0243   RDW 12.3 09/27/2022 0243   RDW 13.1 06/14/2013 2021   LYMPHSABS 1.8 07/06/2022 1928   LYMPHSABS CANCELED 07/05/2019 1530   LYMPHSABS 1.1 06/14/2013 2021   MONOABS 1.0 07/06/2022 1928   MONOABS 1.0 06/14/2013 2021   EOSABS 0.5 07/06/2022 1928   EOSABS CANCELED 07/05/2019 1530   EOSABS 0.3 06/14/2013 2021   BASOSABS 0.1 07/06/2022 1928   BASOSABS CANCELED 07/05/2019 1530   BASOSABS 0.1 06/14/2013 2021    Pulmonary Functions Testing Results:     No data to display           Outpatient Medications Prior to Visit  Medication Sig Dispense Refill   albuterol (VENTOLIN HFA) 108 (90 Base) MCG/ACT inhaler Inhale 2 puffs into the lungs every 6 (six) hours as needed for wheezing or shortness of breath. 8 g  2   apixaban (ELIQUIS) 5 MG TABS tablet Take 1 tablet (5 mg total) by mouth 2 (two) times daily. 60 tablet 2   cyclobenzaprine (FLEXERIL) 10 MG tablet Take 1 tablet (10 mg total) by mouth 3 (three) times daily as needed for muscle spasms. This can make you sleepy. 90 tablet 0   diphenhydrAMINE (BENADRYL) 25 MG tablet Take 3 tablets (75 mg total) by mouth at bedtime as needed. 90 tablet 1   metoprolol tartrate (LOPRESSOR) 25 MG tablet Take 1 tablet (25 mg total) by mouth 2 (two) times daily. 180 tablet 3   pantoprazole (PROTONIX) 40 MG tablet Take 1 tablet (40 mg total) by mouth 2 (two) times daily for 28 days. 56 tablet 0   tadalafil (CIALIS) 20 MG tablet Take 1 tablet (20 mg total) by mouth daily as needed for erectile dysfunction. 30 tablet 6   No facility-administered medications prior to visit.

## 2022-10-13 ENCOUNTER — Ambulatory Visit: Payer: Medicaid Other | Admitting: Student in an Organized Health Care Education/Training Program

## 2022-10-27 ENCOUNTER — Other Ambulatory Visit: Payer: Self-pay | Admitting: Nurse Practitioner

## 2022-10-27 ENCOUNTER — Encounter: Payer: Self-pay | Admitting: Nurse Practitioner

## 2022-10-27 ENCOUNTER — Ambulatory Visit: Payer: Medicaid Other | Admitting: Nurse Practitioner

## 2022-10-27 VITALS — BP 122/74 | HR 72 | Temp 98.0°F | Resp 16 | Ht 71.0 in | Wt 239.9 lb

## 2022-10-27 DIAGNOSIS — M5416 Radiculopathy, lumbar region: Secondary | ICD-10-CM | POA: Diagnosis not present

## 2022-10-27 DIAGNOSIS — M5412 Radiculopathy, cervical region: Secondary | ICD-10-CM | POA: Diagnosis not present

## 2022-10-27 DIAGNOSIS — M47816 Spondylosis without myelopathy or radiculopathy, lumbar region: Secondary | ICD-10-CM | POA: Diagnosis not present

## 2022-10-27 DIAGNOSIS — F3170 Bipolar disorder, currently in remission, most recent episode unspecified: Secondary | ICD-10-CM | POA: Diagnosis not present

## 2022-10-27 DIAGNOSIS — I10 Essential (primary) hypertension: Secondary | ICD-10-CM

## 2022-10-27 DIAGNOSIS — M47812 Spondylosis without myelopathy or radiculopathy, cervical region: Secondary | ICD-10-CM

## 2022-10-27 DIAGNOSIS — G4733 Obstructive sleep apnea (adult) (pediatric): Secondary | ICD-10-CM | POA: Diagnosis not present

## 2022-10-27 DIAGNOSIS — M546 Pain in thoracic spine: Secondary | ICD-10-CM

## 2022-10-27 DIAGNOSIS — M5030 Other cervical disc degeneration, unspecified cervical region: Secondary | ICD-10-CM

## 2022-10-27 DIAGNOSIS — K529 Noninfective gastroenteritis and colitis, unspecified: Secondary | ICD-10-CM | POA: Diagnosis not present

## 2022-10-27 DIAGNOSIS — G8929 Other chronic pain: Secondary | ICD-10-CM

## 2022-10-27 DIAGNOSIS — I421 Obstructive hypertrophic cardiomyopathy: Secondary | ICD-10-CM

## 2022-10-27 DIAGNOSIS — E782 Mixed hyperlipidemia: Secondary | ICD-10-CM

## 2022-10-27 DIAGNOSIS — J439 Emphysema, unspecified: Secondary | ICD-10-CM | POA: Diagnosis not present

## 2022-10-27 DIAGNOSIS — Z131 Encounter for screening for diabetes mellitus: Secondary | ICD-10-CM

## 2022-10-27 MED ORDER — PANTOPRAZOLE SODIUM 40 MG PO TBEC
40.0000 mg | DELAYED_RELEASE_TABLET | Freq: Two times a day (BID) | ORAL | 1 refills | Status: AC
Start: 2022-10-27 — End: ?

## 2022-10-27 MED ORDER — ALBUTEROL SULFATE HFA 108 (90 BASE) MCG/ACT IN AERS
2.0000 | INHALATION_SPRAY | Freq: Four times a day (QID) | RESPIRATORY_TRACT | 2 refills | Status: DC | PRN
Start: 2022-10-27 — End: 2023-02-26

## 2022-10-27 MED ORDER — CYCLOBENZAPRINE HCL 10 MG PO TABS
10.0000 mg | ORAL_TABLET | Freq: Three times a day (TID) | ORAL | 1 refills | Status: DC | PRN
Start: 2022-10-27 — End: 2023-01-01

## 2022-10-27 NOTE — Assessment & Plan Note (Signed)
Offered pain management, he declined.  Offered Cymbalta he declined. Refill sent of flexeril. He continues to take NSAIDs, not recommended.

## 2022-10-27 NOTE — Assessment & Plan Note (Signed)
He declines seeing GI, he has continued to take pantoprazole.  He continues to take NSAIDs for his back pain.  He is aware of the risk he is taking.

## 2022-10-27 NOTE — Assessment & Plan Note (Signed)
Continue taking metoprolol 25 mg BID

## 2022-10-27 NOTE — Assessment & Plan Note (Signed)
Continue using albuterol as needed.  Recommend keeping follow up appointment with pulmonology

## 2022-10-27 NOTE — Assessment & Plan Note (Signed)
Offered pain management, he declined.  Offered Cymbalta he declined. Refill sent of flexeril. He continues to take NSAIDs, not recommended.   

## 2022-10-27 NOTE — Progress Notes (Signed)
BP 122/74 (BP Location: Right Arm, Patient Position: Sitting, Cuff Size: Large)   Pulse 72   Temp 98 F (36.7 C) (Oral)   Resp 16   Ht 5\' 11"  (1.803 m) Comment: per patient  Wt 239 lb 14.4 oz (108.8 kg)   SpO2 98%   BMI 33.46 kg/m    Subjective:    Patient ID: Jake Elliott, male    DOB: [birth year], 52 y.o.   MRN: [id number]  HPI: Jake Elliott is a 52 y.o. male  Chief Complaint  Patient presents with   Establish Care   Establish care: his last physical was has been a while.  Medical history includes cardiomyopathy, HTN,  thrombus pulmonary vein, colitis, osa .   Health Maintenance due for colonoscopy, declined.   Cardiomyopathy/HTN: established with cardiology.  He was last seen by Dr. Mariah Milling 04/17/2022.  He says he seems them every six months.  He currently takes metoprolol 25 mg two times a day.  He reports he is doing okay.  His blood pressure today is 122/74. He reports he has been doing okay.    OSA: does not use a cpap machine.    Emphysema:he reports he uses albuterol for his emphysema., he says he needs a refill.   He says he uses his albuterol inhaler every day. He is established with pulmonary.   Colitis: he was admitted to the hospital on 09/25/2022 for abdominal pain and rectal bleeding diagnosed with colitis and duodenal ulcer.  He was prescribed pantoprazole but he has not been taking it.  He says he has been out of it for days.  He also reports he continues to take 8-9 ibuprofen's a day.  He does not want to go to GI. He declines referral.  He says that he will continue to take the pantoprazole to protect his stomach. Patient is aware of the risk of taking NSAIDs with his ulcer.  Highly recommend patient follow up with GI and stop NSAID use.   Chronic neck and back pain: has seen neurosurgery and is not a candidate for surgery.  He is unable to take NSAIDs due to eliquis and colitis.  He was referred to pain management but he cancelled the  appointment. He was also offered physical therapy but he declined.  He was seen in the emergency department on 10/03/2022 requesting pain medication. He was offered non narcotic management and patient declined and left. He says he has started taking the ibuprofen, aleve, and BC powders again. We discussed going to pain management and he declined.  He says he can't do physical therapy because he is in too much pain.  He has tried some different roots to help with the pain.  He says he has been on Cymbalta before but it did not help.  He says he has tried multiple things but nothing seems to help his pain.   He says that flexeril does help, will send in refill. Also discussed that he could try alternative medicine like acupuncture and such.  He declined.   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  Patient reports that he was told he is fine.  He says that he did notify them that he stopped the eliquis.  He says that he does not need to go back to see them. However, according to their note patient was seen on 10/12/2022 and asked to come off the eliquis and he was advised not to do that.  Bipolar/depression/ADHD:  he is not currently on medications and does not want to go back to psychiatry.      10/27/2022    3:03 PM 03/14/2021   11:53 AM 11/14/2020    3:13 PM 07/31/2020    1:48 PM  Depression screen PHQ 2/9  Decreased Interest 3     Down, Depressed, Hopeless 3     PHQ - 2 Score 6     Altered sleeping 3     Tired, decreased energy 3     Change in appetite 0     Feeling bad or failure about yourself  0     Trouble concentrating 3     Moving slowly or fidgety/restless 0     Suicidal thoughts 0     PHQ-9 Score 15     Difficult doing work/chores Extremely dIfficult        Information is confidential and restricted. Go to Review Flowsheets to unlock data.       10/27/2022    3:04 PM  GAD 7 : Generalized Anxiety Score  Nervous, Anxious, on Edge 0  Control/stop  worrying 0  Worry too much - different things 0  Trouble relaxing 0  Restless 0  Easily annoyed or irritable 3  Afraid - awful might happen 0  Total GAD 7 Score 3  Anxiety Difficulty Not difficult at all     Relevant past medical, surgical, family and social history reviewed and updated as indicated. Interim medical history since our last visit reviewed. Allergies and medications reviewed and updated.  Review of Systems  Constitutional: Negative for fever or weight change.  Respiratory: Negative for cough and shortness of breath.   Cardiovascular: Negative for chest pain or palpitations.  Gastrointestinal: Negative for abdominal pain, no bowel changes.  Musculoskeletal: Negative for gait problem or joint swelling. Positive for neck and back pain Skin: Negative for rash.  Neurological: Negative for dizziness or headache.  No other specific complaints in a complete review of systems (except as listed in HPI above).      Objective:    BP 122/74 (BP Location: Right Arm, Patient Position: Sitting, Cuff Size: Large)   Pulse 72   Temp 98 F (36.7 C) (Oral)   Resp 16   Ht 5\' 11"  (1.803 m) Comment: per patient  Wt 239 lb 14.4 oz (108.8 kg)   SpO2 98%   BMI 33.46 kg/m   Wt Readings from Last 3 Encounters:  10/27/22 239 lb 14.4 oz (108.8 kg)  10/12/22 242 lb 3.2 oz (109.9 kg)  10/03/22 234 lb (106.1 kg)    Physical Exam  Constitutional: Patient appears well-developed and well-nourished. Obese  No distress.  HEENT: head atraumatic, normocephalic, pupils equal and reactive to light, neck supple Cardiovascular: Normal rate, regular rhythm and normal heart sounds.  No murmur heard. No BLE edema. Pulmonary/Chest: Effort normal and breath sounds normal. No respiratory distress. Abdominal: Soft.  There is no tenderness. Psychiatric: Patient has a normal mood and affect. behavior is normal. Judgment and thought content normal.  Results for orders placed or performed during the hospital  encounter of 09/25/22  C Difficile Quick Screen w PCR reflex   Specimen: STOOL  Result Value Ref Range   C Diff antigen NEGATIVE NEGATIVE   C Diff toxin NEGATIVE NEGATIVE   C Diff interpretation No C. difficile detected.   Gastrointestinal Panel by PCR , Stool  Result Value Ref Range   Campylobacter species NOT DETECTED NOT DETECTED   Plesimonas shigelloides NOT  DETECTED NOT DETECTED   Salmonella species NOT DETECTED NOT DETECTED   Yersinia enterocolitica NOT DETECTED NOT DETECTED   Vibrio species NOT DETECTED NOT DETECTED   Vibrio cholerae NOT DETECTED NOT DETECTED   Enteroaggregative E coli (EAEC) NOT DETECTED NOT DETECTED   Enteropathogenic E coli (EPEC) NOT DETECTED NOT DETECTED   Enterotoxigenic E coli (ETEC) NOT DETECTED NOT DETECTED   Shiga like toxin producing E coli (STEC) NOT DETECTED NOT DETECTED   Shigella/Enteroinvasive E coli (EIEC) NOT DETECTED NOT DETECTED   Cryptosporidium NOT DETECTED NOT DETECTED   Cyclospora cayetanensis NOT DETECTED NOT DETECTED   Entamoeba histolytica NOT DETECTED NOT DETECTED   Giardia lamblia NOT DETECTED NOT DETECTED   Adenovirus F40/41 NOT DETECTED NOT DETECTED   Astrovirus NOT DETECTED NOT DETECTED   Norovirus GI/GII NOT DETECTED NOT DETECTED   Rotavirus A NOT DETECTED NOT DETECTED   Sapovirus (I, II, IV, and V) NOT DETECTED NOT DETECTED  Comprehensive metabolic panel  Result Value Ref Range   Sodium 136 135 - 145 mmol/L   Potassium 4.3 3.5 - 5.1 mmol/L   Chloride 104 98 - 111 mmol/L   CO2 18 (L) 22 - 32 mmol/L   Glucose, Bld 135 (H) 70 - 99 mg/dL   BUN 15 6 - 20 mg/dL   Creatinine, Ser 1.61 0.61 - 1.24 mg/dL   Calcium 9.6 8.9 - 09.6 mg/dL   Total Protein 7.6 6.5 - 8.1 g/dL   Albumin 4.3 3.5 - 5.0 g/dL   AST 30 15 - 41 U/L   ALT 23 0 - 44 U/L   Alkaline Phosphatase 79 38 - 126 U/L   Total Bilirubin 0.8 0.3 - 1.2 mg/dL   GFR, Estimated >04 >54 mL/min   Anion gap 14 5 - 15  CBC  Result Value Ref Range   WBC 13.0 (H) 4.0 - 10.5  K/uL   RBC 5.97 (H) 4.22 - 5.81 MIL/uL   Hemoglobin 17.9 (H) 13.0 - 17.0 g/dL   HCT 09.8 11.9 - 14.7 %   MCV 86.6 80.0 - 100.0 fL   MCH 30.0 26.0 - 34.0 pg   MCHC 34.6 30.0 - 36.0 g/dL   RDW 82.9 56.2 - 13.0 %   Platelets 250 150 - 400 K/uL   nRBC 0.0 0.0 - 0.2 %  Protime-INR - (order if Patient is taking Coumadin / Warfarin)  Result Value Ref Range   Prothrombin Time 13.0 11.4 - 15.2 seconds   INR 1.0 0.8 - 1.2  Lactic acid, plasma  Result Value Ref Range   Lactic Acid, Venous 1.8 0.5 - 1.9 mmol/L  Urinalysis, Routine w reflex microscopic -Urine, Clean Catch  Result Value Ref Range   Color, Urine YELLOW (A) YELLOW   APPearance CLEAR (A) CLEAR   Specific Gravity, Urine >1.046 (H) 1.005 - 1.030   pH 6.0 5.0 - 8.0   Glucose, UA NEGATIVE NEGATIVE mg/dL   Hgb urine dipstick NEGATIVE NEGATIVE   Bilirubin Urine NEGATIVE NEGATIVE   Ketones, ur NEGATIVE NEGATIVE mg/dL   Protein, ur NEGATIVE NEGATIVE mg/dL   Nitrite NEGATIVE NEGATIVE   Leukocytes,Ua NEGATIVE NEGATIVE  Comprehensive metabolic panel  Result Value Ref Range   Sodium 138 135 - 145 mmol/L   Potassium 4.2 3.5 - 5.1 mmol/L   Chloride 103 98 - 111 mmol/L   CO2 26 22 - 32 mmol/L   Glucose, Bld 103 (H) 70 - 99 mg/dL   BUN 12 6 - 20 mg/dL   Creatinine, Ser 8.65 0.61 -  1.24 mg/dL   Calcium 8.8 (L) 8.9 - 10.3 mg/dL   Total Protein 6.3 (L) 6.5 - 8.1 g/dL   Albumin 3.7 3.5 - 5.0 g/dL   AST 18 15 - 41 U/L   ALT 18 0 - 44 U/L   Alkaline Phosphatase 66 38 - 126 U/L   Total Bilirubin 0.8 0.3 - 1.2 mg/dL   GFR, Estimated >82 >95 mL/min   Anion gap 9 5 - 15  CBC  Result Value Ref Range   WBC 7.7 4.0 - 10.5 K/uL   RBC 5.08 4.22 - 5.81 MIL/uL   Hemoglobin 14.9 13.0 - 17.0 g/dL   HCT 62.1 30.8 - 65.7 %   MCV 87.0 80.0 - 100.0 fL   MCH 29.3 26.0 - 34.0 pg   MCHC 33.7 30.0 - 36.0 g/dL   RDW 84.6 96.2 - 95.2 %   Platelets 215 150 - 400 K/uL   nRBC 0.0 0.0 - 0.2 %  Magnesium  Result Value Ref Range   Magnesium 2.2 1.7 - 2.4  mg/dL  Phosphorus  Result Value Ref Range   Phosphorus 4.0 2.5 - 4.6 mg/dL  HIV Antibody (routine testing w rflx)  Result Value Ref Range   HIV Screen 4th Generation wRfx Non Reactive Non Reactive  Hemoglobin and hematocrit, blood  Result Value Ref Range   Hemoglobin 15.9 13.0 - 17.0 g/dL   HCT 84.1 32.4 - 40.1 %  APTT  Result Value Ref Range   aPTT >200 (HH) 24 - 36 seconds  Heparin level (unfractionated)  Result Value Ref Range   Heparin Unfractionated >1.10 (H) 0.30 - 0.70 IU/mL  Protime-INR  Result Value Ref Range   Prothrombin Time 15.6 (H) 11.4 - 15.2 seconds   INR 1.3 (H) 0.8 - 1.2  CBC  Result Value Ref Range   WBC 6.5 4.0 - 10.5 K/uL   RBC 5.04 4.22 - 5.81 MIL/uL   Hemoglobin 14.9 13.0 - 17.0 g/dL   HCT 02.7 25.3 - 66.4 %   MCV 85.5 80.0 - 100.0 fL   MCH 29.6 26.0 - 34.0 pg   MCHC 34.6 30.0 - 36.0 g/dL   RDW 40.3 47.4 - 25.9 %   Platelets 197 150 - 400 K/uL   nRBC 0.0 0.0 - 0.2 %  APTT  Result Value Ref Range   aPTT 73 (H) 24 - 36 seconds  Heparin level (unfractionated)  Result Value Ref Range   Heparin Unfractionated >1.10 (H) 0.30 - 0.70 IU/mL  APTT  Result Value Ref Range   aPTT 88 (H) 24 - 36 seconds  Heparin level (unfractionated)  Result Value Ref Range   Heparin Unfractionated >1.10 (H) 0.30 - 0.70 IU/mL  Type and screen Ordered by PROVIDER DEFAULT  Result Value Ref Range   ABO/RH(D) A NEG    Antibody Screen NEG    Sample Expiration      09/28/2022,2359 Performed at [facility], [address] [place], [place] [address]       Assessment & Plan:   Problem List Items Addressed This Visit       Cardiovascular and Mediastinum   Essential hypertension    Continue taking metoprolol 25 mg BID      Hypertrophic obstructive cardiomyopathy (HCC)    Continue taking metoprolol 25 mg two times a day. Keep follow up appointment with cardiology.          Respiratory   OSA (obstructive sleep apnea)    He does not tolerate cpap  Pulmonary emphysema (HCC)    Continue using albuterol as needed.  Recommend keeping follow up appointment with pulmonology      Relevant Medications   albuterol (VENTOLIN HFA) 108 (90 Base) MCG/ACT inhaler     Digestive   Colitis    He declines seeing GI, he has continued to take pantoprazole.  He continues to take NSAIDs for his back pain.  He is aware of the risk he is taking.       Relevant Medications   pantoprazole (PROTONIX) 40 MG tablet     Nervous and Auditory   Lumbar radiculopathy - Primary    Offered pain management, he declined.  Offered Cymbalta he declined. Refill sent of flexeril. He continues to take NSAIDs, not recommended.        Relevant Medications   cyclobenzaprine (FLEXERIL) 10 MG tablet   Cervical radiculopathy    Offered pain management, he declined.  Offered Cymbalta he declined. Refill sent of flexeril. He continues to take NSAIDs, not recommended.        Relevant Medications   cyclobenzaprine (FLEXERIL) 10 MG tablet     Musculoskeletal and Integument   Lumbar spondylosis    Offered pain management, he declined.  Offered Cymbalta he declined. Refill sent of flexeril. He continues to take NSAIDs, not recommended.        Relevant Medications   cyclobenzaprine (FLEXERIL) 10 MG tablet   DDD (degenerative disc disease), cervical    Offered pain management, he declined.  Offered Cymbalta he declined. Refill sent of flexeril. He continues to take NSAIDs, not recommended.        Relevant Medications   cyclobenzaprine (FLEXERIL) 10 MG tablet     Other   Affective bipolar disorder (HCC)    Declined to see psychiatry, not currently on medication      Chronic thoracic back pain    Offered pain management, he declined.  Offered Cymbalta he declined. Refill sent of flexeril. He continues to take NSAIDs, not recommended.        Relevant Medications   cyclobenzaprine (FLEXERIL) 10 MG tablet     Follow up plan: No follow-ups on file.

## 2022-10-27 NOTE — Assessment & Plan Note (Signed)
>>  ASSESSMENT AND PLAN FOR AFFECTIVE BIPOLAR DISORDER (HCC) WRITTEN ON 10/27/2022  4:16 PM BY PENDER, JULIE F, FNP  Declined to see psychiatry, not currently on medication

## 2022-10-27 NOTE — Telephone Encounter (Unsigned)
Copied from CRM [id number]. Topic: General - Other >> Oct 27, 2022  4:01 PM Carrielelia G wrote: Reason for CRM: attn nurse Zane Herald, I will not be having labs drawn at the cardiology office until August. So I would like to have my blood drawn as soon as possible.   He would like every lab that he can get that is necessary.  Patient is a difficulty stick and some draws patient blood has clotted before it could be tested. fYI

## 2022-10-27 NOTE — Assessment & Plan Note (Signed)
Continue taking metoprolol 25 mg two times a day. Keep follow up appointment with cardiology.

## 2022-10-27 NOTE — Assessment & Plan Note (Signed)
He does not tolerate cpap

## 2022-10-27 NOTE — Assessment & Plan Note (Signed)
Declined to see psychiatry, not currently on medication

## 2022-10-30 ENCOUNTER — Other Ambulatory Visit
Admission: RE | Admit: 2022-10-30 | Discharge: 2022-10-30 | Disposition: A | Discharge status: Home / Self Care | Payer: Medicaid Other | Attending: Nurse Practitioner | Admitting: Nurse Practitioner

## 2022-10-30 DIAGNOSIS — I421 Obstructive hypertrophic cardiomyopathy: Secondary | ICD-10-CM | POA: Insufficient documentation

## 2022-10-30 DIAGNOSIS — E782 Mixed hyperlipidemia: Secondary | ICD-10-CM | POA: Insufficient documentation

## 2022-10-30 DIAGNOSIS — I10 Essential (primary) hypertension: Secondary | ICD-10-CM | POA: Diagnosis not present

## 2022-10-30 DIAGNOSIS — Z131 Encounter for screening for diabetes mellitus: Secondary | ICD-10-CM | POA: Diagnosis not present

## 2022-10-30 LAB — CBC WITH DIFFERENTIAL/PLATELET
Abs Immature Granulocytes: 0.01 10*3/uL (ref 0.00–0.07)
Basophils Absolute: 0.1 10*3/uL (ref 0.0–0.1)
Basophils Relative: 1 %
Eosinophils Absolute: 0.1 10*3/uL (ref 0.0–0.5)
Eosinophils Relative: 2 %
HCT: 44.4 % (ref 39.0–52.0)
Hemoglobin: 15 g/dL (ref 13.0–17.0)
Immature Granulocytes: 0 %
Lymphocytes Relative: 19 %
Lymphs Abs: 1 10*3/uL (ref 0.7–4.0)
MCH: 29.5 pg (ref 26.0–34.0)
MCHC: 33.8 g/dL (ref 30.0–36.0)
MCV: 87.2 fL (ref 80.0–100.0)
Monocytes Absolute: 0.4 10*3/uL (ref 0.1–1.0)
Monocytes Relative: 6 %
Neutro Abs: 3.9 10*3/uL (ref 1.7–7.7)
Neutrophils Relative %: 72 %
Platelets: 206 10*3/uL (ref 150–400)
RBC: 5.09 MIL/uL (ref 4.22–5.81)
RDW: 11.9 % (ref 11.5–15.5)
WBC: 5.4 10*3/uL (ref 4.0–10.5)
nRBC: 0 % (ref 0.0–0.2)

## 2022-10-30 LAB — LIPID PANEL
Cholesterol: 217 mg/dL — ABNORMAL HIGH (ref 0–200)
HDL: 40 mg/dL — ABNORMAL LOW (ref >40)
LDL Cholesterol: 121 mg/dL — ABNORMAL HIGH (ref 0–99)
Total CHOL/HDL Ratio: 5.4 RATIO
Triglycerides: 280 mg/dL — ABNORMAL HIGH (ref <150)
VLDL: 56 mg/dL — ABNORMAL HIGH (ref 0–40)

## 2022-10-30 LAB — COMPREHENSIVE METABOLIC PANEL
ALT: 21 U/L (ref 0–44)
AST: 23 U/L (ref 15–41)
Albumin: 4.2 g/dL (ref 3.5–5.0)
Alkaline Phosphatase: 63 U/L (ref 38–126)
Anion gap: 6 (ref 5–15)
BUN: 26 mg/dL — ABNORMAL HIGH (ref 6–20)
CO2: 25 mmol/L (ref 22–32)
Calcium: 9.3 mg/dL (ref 8.9–10.3)
Chloride: 109 mmol/L (ref 98–111)
Creatinine, Ser: 1.01 mg/dL (ref 0.61–1.24)
GFR, Estimated: >60 mL/min (ref >60)
Glucose, Bld: 127 mg/dL — ABNORMAL HIGH (ref 70–99)
Potassium: 4.1 mmol/L (ref 3.5–5.1)
Sodium: 140 mmol/L (ref 135–145)
Total Bilirubin: 0.8 mg/dL (ref 0.3–1.2)
Total Protein: 6.8 g/dL (ref 6.5–8.1)

## 2022-10-30 LAB — HEMOGLOBIN A1C
Hgb A1c MFr Bld: 5.3 % (ref 4.8–5.6)
Mean Plasma Glucose: 105.41 mg/dL

## 2022-10-30 LAB — TSH: TSH: 2.652 u[IU]/mL (ref 0.350–4.500)

## 2022-10-31 LAB — PROLACTIN: Prolactin: 4.4 ng/mL (ref 3.9–22.7)

## 2022-11-05 LAB — TESTOSTERONE,FREE AND TOTAL
Testosterone, Free: 3 pg/mL — ABNORMAL LOW (ref 6.8–21.5)
Testosterone: 174 ng/dL — ABNORMAL LOW (ref 264–916)

## 2022-11-06 ENCOUNTER — Other Ambulatory Visit: Payer: Self-pay | Admitting: Nurse Practitioner

## 2022-11-06 ENCOUNTER — Telehealth: Payer: Self-pay | Admitting: Nurse Practitioner

## 2022-11-06 DIAGNOSIS — E349 Endocrine disorder, unspecified: Secondary | ICD-10-CM

## 2022-11-06 DIAGNOSIS — F3131 Bipolar disorder, current episode depressed, mild: Secondary | ICD-10-CM

## 2022-11-06 NOTE — Telephone Encounter (Unsigned)
Copied from CRM [id number]. Topic: General - Call Back - No Documentation >> Nov 06, 2022 12:20 PM Jake Elliott wrote: Reason for CRM: Pt stated he had a missed call from the office so he was returning the call. Cb# [phone]

## 2022-11-06 NOTE — Telephone Encounter (Signed)
Referrals put in for urology and psych. Patient notified

## 2022-11-10 ENCOUNTER — Encounter: Payer: Self-pay | Admitting: Nurse Practitioner

## 2022-11-11 ENCOUNTER — Other Ambulatory Visit: Payer: Self-pay | Admitting: Nurse Practitioner

## 2022-11-11 DIAGNOSIS — E349 Endocrine disorder, unspecified: Secondary | ICD-10-CM

## 2022-11-13 ENCOUNTER — Other Ambulatory Visit: Payer: Self-pay

## 2022-11-13 ENCOUNTER — Ambulatory Visit: Payer: Medicaid Other | Admitting: Nurse Practitioner

## 2022-11-13 ENCOUNTER — Encounter: Payer: Self-pay | Admitting: Nurse Practitioner

## 2022-11-13 VITALS — BP 122/74 | HR 93 | Temp 97.8°F | Resp 18 | Ht 71.0 in | Wt 237.5 lb

## 2022-11-13 DIAGNOSIS — E349 Endocrine disorder, unspecified: Secondary | ICD-10-CM

## 2022-11-13 DIAGNOSIS — F3131 Bipolar disorder, current episode depressed, mild: Secondary | ICD-10-CM

## 2022-11-13 DIAGNOSIS — F900 Attention-deficit hyperactivity disorder, predominantly inattentive type: Secondary | ICD-10-CM

## 2022-11-13 MED ORDER — TESTOSTERONE 40.5 MG/2.5GM (1.62%) TD GEL
40.5000 mg | Freq: Every day | TRANSDERMAL | 3 refills | Status: DC
Start: 2022-11-13 — End: 2022-11-20

## 2022-11-13 NOTE — Progress Notes (Addendum)
BP 122/74   Pulse 93   Temp 97.8 F (36.6 C) (Oral)   Resp 18   Ht 5\' 11"  (1.803 m)   Wt 237 lb 8 oz (107.7 kg)   SpO2 96%   BMI 33.12 kg/m    Subjective:    Patient ID: Jake Elliott, male    DOB: [birth year], 52 y.o.   MRN: [id number]  HPI: Jake Elliott is a 52 y.o. male  Chief Complaint  Patient presents with   Referral    Discuss medication for testosterone   Testosterone deficiency:  patient requested testosterone level checked due to history of low testosterone.  Testosterone was low at 174 on 10/30/2022. Referral was placed to urology, patient declined referral and requested referral to endocrinology.  Explained to patient that there is a long wait for endocrinology. Patient reported that he still wanted to see endocrinologist. He reports he used to be on androgel and would like to restart that while he is waiting to be seen at endocrinology. Discussed risk associated with testosterone replacement and he verbalized understanding.    Bipolar/adhd:  patient had not heard from psychiatry.  Upon eval of referral patient can not return to [facility], will send paper referral to [facility]. He is not currently on medication.  He was being seen by psychiatry but is lost to follow up. Last seen by Dr. Elna Breslow on 03/14/2021.history of ADHD, insomnia and bipolar. He was on dextrostat,lunesta, he has been on multiple mood stabilizers and SSRIs in the past.    11/13/2022   11:54 AM 10/27/2022    3:03 PM 03/14/2021   11:53 AM 11/14/2020    3:13 PM 07/31/2020    1:48 PM  Depression screen PHQ 2/9  Decreased Interest 3 3 0 1 2  Down, Depressed, Hopeless 3 3 2  0 3  PHQ - 2 Score 6 6 2 1 5   Altered sleeping 3 3 0  0  Tired, decreased energy 3 3 1  3   Change in appetite 3 0 0  0  Feeling bad or failure about yourself  3 0 0  3  Trouble concentrating 3 3 0  0  Moving slowly or fidgety/restless 3 0 0  0  Suicidal thoughts 0 0 0  0  PHQ-9 Score 24 15 3  11    Difficult doing work/chores Somewhat difficult Extremely dIfficult   Very difficult       11/13/2022   11:55 AM 10/27/2022    3:04 PM  GAD 7 : Generalized Anxiety Score  Nervous, Anxious, on Edge 3 0  Control/stop worrying 0 0  Worry too much - different things 0 0  Trouble relaxing 1 0  Restless 1 0  Easily annoyed or irritable 3 3  Afraid - awful might happen 0 0  Total GAD 7 Score 8 3  Anxiety Difficulty Somewhat difficult Not difficult at all     Relevant past medical, surgical, family and social history reviewed and updated as indicated. Interim medical history since our last visit reviewed. Allergies and medications reviewed and updated.  Review of Systems  Constitutional: Negative for fever or weight change.  Respiratory: Negative for cough and shortness of breath.   Cardiovascular: Negative for chest pain or palpitations.  Gastrointestinal: Negative for abdominal pain, no bowel changes.  Musculoskeletal: Negative for gait problem or joint swelling.  Skin: Negative for rash.  Neurological: Negative for dizziness or headache.  No other specific complaints in a complete review of systems (  except as listed in HPI above).      Objective:    BP 122/74   Pulse 93   Temp 97.8 F (36.6 C) (Oral)   Resp 18   Ht 5\' 11"  (1.803 m)   Wt 237 lb 8 oz (107.7 kg)   SpO2 96%   BMI 33.12 kg/m   Wt Readings from Last 3 Encounters:  11/13/22 237 lb 8 oz (107.7 kg)  10/27/22 239 lb 14.4 oz (108.8 kg)  10/12/22 242 lb 3.2 oz (109.9 kg)    Physical Exam  Constitutional: Patient appears well-developed and well-nourished. Obese  No distress.  HEENT: head atraumatic, normocephalic, pupils equal and reactive to light, neck supple Cardiovascular: Normal rate, regular rhythm and normal heart sounds.  No murmur heard. No BLE edema. Pulmonary/Chest: Effort normal and breath sounds normal. No respiratory distress. Abdominal: Soft.  There is no tenderness. Psychiatric: Patient has a  normal mood and affect. behavior is normal. Judgment and thought content normal.  Results for orders placed or performed during the hospital encounter of 10/30/22  Testosterone,Free and Total  Result Value Ref Range   Testosterone 174 (L) 264 - 916 ng/dL   Testosterone, Free 3.0 (L) 6.8 - 21.5 pg/mL  TSH  Result Value Ref Range   TSH 2.652 0.350 - 4.500 uIU/mL  Prolactin  Result Value Ref Range   Prolactin 4.4 3.9 - 22.7 ng/mL  CBC with Differential/Platelet  Result Value Ref Range   WBC 5.4 4.0 - 10.5 K/uL   RBC 5.09 4.22 - 5.81 MIL/uL   Hemoglobin 15.0 13.0 - 17.0 g/dL   HCT 16.1 09.6 - 04.5 %   MCV 87.2 80.0 - 100.0 fL   MCH 29.5 26.0 - 34.0 pg   MCHC 33.8 30.0 - 36.0 g/dL   RDW 40.9 81.1 - 91.4 %   Platelets 206 150 - 400 K/uL   nRBC 0.0 0.0 - 0.2 %   Neutrophils Relative % 72 %   Neutro Abs 3.9 1.7 - 7.7 K/uL   Lymphocytes Relative 19 %   Lymphs Abs 1.0 0.7 - 4.0 K/uL   Monocytes Relative 6 %   Monocytes Absolute 0.4 0.1 - 1.0 K/uL   Eosinophils Relative 2 %   Eosinophils Absolute 0.1 0.0 - 0.5 K/uL   Basophils Relative 1 %   Basophils Absolute 0.1 0.0 - 0.1 K/uL   Immature Granulocytes 0 %   Abs Immature Granulocytes 0.01 0.00 - 0.07 K/uL  Hemoglobin A1c  Result Value Ref Range   Hgb A1c MFr Bld 5.3 4.8 - 5.6 %   Mean Plasma Glucose 105.41 mg/dL  Lipid panel  Result Value Ref Range   Cholesterol 217 (H) 0 - 200 mg/dL   Triglycerides 782 (H) <150 mg/dL   HDL 40 (L) >95 mg/dL   Total CHOL/HDL Ratio 5.4 RATIO   VLDL 56 (H) 0 - 40 mg/dL   LDL Cholesterol 621 (H) 0 - 99 mg/dL  Comprehensive metabolic panel  Result Value Ref Range   Sodium 140 135 - 145 mmol/L   Potassium 4.1 3.5 - 5.1 mmol/L   Chloride 109 98 - 111 mmol/L   CO2 25 22 - 32 mmol/L   Glucose, Bld 127 (H) 70 - 99 mg/dL   BUN 26 (H) 6 - 20 mg/dL   Creatinine, Ser 3.08 0.61 - 1.24 mg/dL   Calcium 9.3 8.9 - 65.7 mg/dL   Total Protein 6.8 6.5 - 8.1 g/dL   Albumin 4.2 3.5 - 5.0 g/dL  AST 23 15 -  41 U/L   ALT 21 0 - 44 U/L   Alkaline Phosphatase 63 38 - 126 U/L   Total Bilirubin 0.8 0.3 - 1.2 mg/dL   GFR, Estimated >16 >10 mL/min   Anion gap 6 5 - 15      Assessment & Plan:   Problem List Items Addressed This Visit       Other   Bipolar affective disorder [facility])    Referral placed to [facility].       Attention deficit hyperactivity disorder (ADHD), predominantly inattentive type    Referral placed to [facility].       Other Visit Diagnoses     Testosterone deficiency    -  Primary   referral was placed to urology, patient prefers endocrinology referral was placed, patient previously on testosterone would like to restart, prescription sent   Relevant Medications   Testosterone (ANDROGEL) 40.5 MG/2.5GM (1.62%) GEL        Follow up plan: Return if symptoms worsen or fail to improve.

## 2022-11-13 NOTE — Assessment & Plan Note (Signed)
Referral placed to [facility].

## 2022-11-13 NOTE — Assessment & Plan Note (Signed)
Referral placed to [facility].  

## 2022-11-14 DIAGNOSIS — Z419 Encounter for procedure for purposes other than remedying health state, unspecified: Secondary | ICD-10-CM | POA: Diagnosis not present

## 2022-11-18 NOTE — Telephone Encounter (Signed)
Copied from CRM [id number]. Topic: General - Other >> Nov 17, 2022  5:58 PM Jake Elliott wrote: Reason for CRM: The patient has called to follow up on a previous request for completion of a prior authorization for their Testosterone (ANDROGEL) 40.5 MG/2.5GM (1.62%) GEL [[id number]] prescription   Please contact further when possible

## 2022-11-19 NOTE — Telephone Encounter (Signed)
Please send in new script 

## 2022-11-20 ENCOUNTER — Other Ambulatory Visit: Payer: Self-pay | Admitting: Nurse Practitioner

## 2022-11-20 ENCOUNTER — Telehealth: Payer: Self-pay

## 2022-11-20 DIAGNOSIS — E349 Endocrine disorder, unspecified: Secondary | ICD-10-CM

## 2022-11-20 MED ORDER — TESTOSTERONE 40.5 MG/2.5GM (1.62%) TD GEL
1.0000 | Freq: Every day | TRANSDERMAL | 3 refills | Status: DC
Start: 2022-11-20 — End: 2022-11-27

## 2022-11-20 NOTE — Telephone Encounter (Signed)
Call was dropped during transfer.  Called pharmacy and no one answered x 2 calls.

## 2022-11-24 ENCOUNTER — Encounter: Payer: Self-pay | Admitting: Cardiovascular Disease

## 2022-11-24 NOTE — Telephone Encounter (Signed)
Pt. Calling about testosterone prescription. States pharmacy reports this order was cancelled by the provider. They need a new order and prior authorization per pt. Please advise pt.

## 2022-11-25 MED ORDER — VERAPAMIL HCL 120 MG PO TABS: 120.0000 mg | ORAL_TABLET | Freq: Three times a day (TID) | ORAL | 3 refills | Status: DC

## 2022-11-25 NOTE — Telephone Encounter (Signed)
Per Raynelle Fanning new order written and sent

## 2022-11-27 ENCOUNTER — Telehealth: Payer: Self-pay | Admitting: Nurse Practitioner

## 2022-11-27 ENCOUNTER — Other Ambulatory Visit: Payer: Self-pay

## 2022-11-27 DIAGNOSIS — E349 Endocrine disorder, unspecified: Secondary | ICD-10-CM

## 2022-11-27 MED ORDER — TESTOSTERONE 40.5 MG/2.5GM (1.62%) TD GEL
2.0000 | Freq: Every day | TRANSDERMAL | 3 refills | Status: DC
Start: 2022-11-27 — End: 2022-12-25

## 2022-11-27 NOTE — Telephone Encounter (Signed)
Pt notified and is currently on his way to the pharmacy.

## 2022-11-27 NOTE — Telephone Encounter (Signed)
Pt said that his Testerone needs to go to [facility] on [address] , the one that ins approves is 25mg  2.5 gram jelly packs will be approved with prior auth. If we can call the  generic Andro gel pump 1.62 at 20.25 mg per pumps to [facility] on [address] ( Patient will pay in cash).  Pt wants to know if this can please be called in today. If so please notify.

## 2022-12-09 ENCOUNTER — Encounter: Payer: Self-pay | Admitting: Nurse Practitioner

## 2022-12-09 ENCOUNTER — Ambulatory Visit: Payer: Medicaid Other | Admitting: Nurse Practitioner

## 2022-12-09 ENCOUNTER — Other Ambulatory Visit: Payer: Self-pay

## 2022-12-09 VITALS — BP 130/80 | HR 100 | Temp 97.8°F | Resp 18 | Ht 71.0 in | Wt 244.9 lb

## 2022-12-09 DIAGNOSIS — L301 Dyshidrosis [pompholyx]: Secondary | ICD-10-CM | POA: Diagnosis not present

## 2022-12-09 DIAGNOSIS — F3131 Bipolar disorder, current episode depressed, mild: Secondary | ICD-10-CM | POA: Diagnosis not present

## 2022-12-09 MED ORDER — TRIAMCINOLONE ACETONIDE 0.5 % EX OINT
1.0000 | TOPICAL_OINTMENT | Freq: Two times a day (BID) | CUTANEOUS | 3 refills | Status: AC
Start: 2022-12-09 — End: ?

## 2022-12-09 NOTE — Assessment & Plan Note (Signed)
[facility] declined [facility], he is going to head over to [facility] and see if he can be seen there.

## 2022-12-09 NOTE — Progress Notes (Signed)
BP 130/80   Pulse 100   Temp 97.8 F (36.6 C) (Oral)   Resp 18   Ht 5\' 11"  (1.803 m)   Wt 244 lb 14.4 oz (111.1 kg)   SpO2 96%   BMI 34.16 kg/m    Subjective:    Patient ID: Jake Elliott, male    DOB: [birth year], 52 y.o.   MRN: [id number]  HPI: Jake Elliott is a 52 y.o. male  Chief Complaint  Patient presents with   Rash    Skin issue on hands   Manic Behavior    Follow up   Bipolar/adhd/insomnia Medication not currently on medication, previously on dextroamphetamine, lunesta, multiple different mood stabilizers and SSRIs Compliant n/a Side effects n/a PHQ9 positive GAD positive Therapy previously established with Dr. Elna Breslow is not able to return to [facility] last seen 03/14/2021. Has history of ADHD, insomnia and bipolar.  He has previously been on multiple mood stabilizers and SSRIs in the past.  Referral was previously placed with [facility]. He says that he has decided that he does not want to go anywhere at this point.  He says that he did not like the reviews of [facility]. He is going to walk over to [facility] and see if they will accept him back.      12/09/2022    2:27 PM 11/13/2022   11:54 AM 10/27/2022    3:03 PM 03/14/2021   11:53 AM 11/14/2020    3:13 PM  Depression screen PHQ 2/9  Decreased Interest 3 3 3  0 1  Down, Depressed, Hopeless 3 3 3 2  0  PHQ - 2 Score 6 6 6 2 1   Altered sleeping 3 3 3  0   Tired, decreased energy 3 3 3 1    Change in appetite 3 3 0 0   Feeling bad or failure about yourself  3 3 0 0   Trouble concentrating 3 3 3  0   Moving slowly or fidgety/restless 3 3 0 0   Suicidal thoughts 0 0 0 0   PHQ-9 Score 24 24 15 3    Difficult doing work/chores Somewhat difficult Somewhat difficult Extremely dIfficult         12/09/2022    2:27 PM 11/13/2022   11:55 AM 10/27/2022    3:04 PM  GAD 7 : Generalized Anxiety Score  Nervous, Anxious, on Edge 3 3 0  Control/stop worrying 0 0 0  Worry too much -  different things 0 0 0  Trouble relaxing 3 1 0  Restless 3 1 0  Easily annoyed or irritable 3 3 3   Afraid - awful might happen 0 0 0  Total GAD 7 Score 12 8 3   Anxiety Difficulty Somewhat difficult Somewhat difficult Not difficult at all     Dyshidrotic Eczema: Rash on bilateral hands: patient reports he has a rash that pops up during the summer. Off and on for twenty years, he says it gets bad during the summer. He reports the rash is pruritic at times.  Will give him kenalog cream.    Relevant past medical, surgical, family and social history reviewed and updated as indicated. Interim medical history since our last visit reviewed. Allergies and medications reviewed and updated.  Review of Systems  Constitutional: Negative for fever or weight change.  Respiratory: Negative for cough and shortness of breath.   Cardiovascular: Negative for chest pain or palpitations.  Gastrointestinal: Negative for abdominal pain, no bowel changes.  Musculoskeletal: Negative for gait  problem or joint swelling.  Skin: positive for rash.  Neurological: Negative for dizziness or headache.  No other specific complaints in a complete review of systems (except as listed in HPI above).      Objective:    BP 130/80   Pulse 100   Temp 97.8 F (36.6 C) (Oral)   Resp 18   Ht 5\' 11"  (1.803 m)   Wt 244 lb 14.4 oz (111.1 kg)   SpO2 96%   BMI 34.16 kg/m   Wt Readings from Last 3 Encounters:  12/09/22 244 lb 14.4 oz (111.1 kg)  11/13/22 237 lb 8 oz (107.7 kg)  10/27/22 239 lb 14.4 oz (108.8 kg)    Physical Exam  Constitutional: Patient appears well-developed and well-nourished. Obese No distress.  HEENT: head atraumatic, normocephalic, pupils equal and reactive to light, neck supple, throat within normal limits Cardiovascular: Normal rate, regular rhythm and normal heart sounds.  No murmur heard. No BLE edema. Pulmonary/Chest: Effort normal and breath sounds normal. No respiratory distress. Abdominal:  Soft.  There is no tenderness. Skin: small blisters noted on bilateral hands Psychiatric: Patient has a normal mood and affect. behavior is normal. Judgment and thought content normal.   Results for orders placed or performed during the hospital encounter of 10/30/22  Testosterone,Free and Total  Result Value Ref Range   Testosterone 174 (L) 264 - 916 ng/dL   Testosterone, Free 3.0 (L) 6.8 - 21.5 pg/mL  TSH  Result Value Ref Range   TSH 2.652 0.350 - 4.500 uIU/mL  Prolactin  Result Value Ref Range   Prolactin 4.4 3.9 - 22.7 ng/mL  CBC with Differential/Platelet  Result Value Ref Range   WBC 5.4 4.0 - 10.5 K/uL   RBC 5.09 4.22 - 5.81 MIL/uL   Hemoglobin 15.0 13.0 - 17.0 g/dL   HCT 08.6 57.8 - 46.9 %   MCV 87.2 80.0 - 100.0 fL   MCH 29.5 26.0 - 34.0 pg   MCHC 33.8 30.0 - 36.0 g/dL   RDW 62.9 52.8 - 41.3 %   Platelets 206 150 - 400 K/uL   nRBC 0.0 0.0 - 0.2 %   Neutrophils Relative % 72 %   Neutro Abs 3.9 1.7 - 7.7 K/uL   Lymphocytes Relative 19 %   Lymphs Abs 1.0 0.7 - 4.0 K/uL   Monocytes Relative 6 %   Monocytes Absolute 0.4 0.1 - 1.0 K/uL   Eosinophils Relative 2 %   Eosinophils Absolute 0.1 0.0 - 0.5 K/uL   Basophils Relative 1 %   Basophils Absolute 0.1 0.0 - 0.1 K/uL   Immature Granulocytes 0 %   Abs Immature Granulocytes 0.01 0.00 - 0.07 K/uL  Hemoglobin A1c  Result Value Ref Range   Hgb A1c MFr Bld 5.3 4.8 - 5.6 %   Mean Plasma Glucose 105.41 mg/dL  Lipid panel  Result Value Ref Range   Cholesterol 217 (H) 0 - 200 mg/dL   Triglycerides 244 (H) <150 mg/dL   HDL 40 (L) >01 mg/dL   Total CHOL/HDL Ratio 5.4 RATIO   VLDL 56 (H) 0 - 40 mg/dL   LDL Cholesterol 027 (H) 0 - 99 mg/dL  Comprehensive metabolic panel  Result Value Ref Range   Sodium 140 135 - 145 mmol/L   Potassium 4.1 3.5 - 5.1 mmol/L   Chloride 109 98 - 111 mmol/L   CO2 25 22 - 32 mmol/L   Glucose, Bld 127 (H) 70 - 99 mg/dL   BUN 26 (H) 6 -  20 mg/dL   Creatinine, Ser 8.11 0.61 - 1.24 mg/dL    Calcium 9.3 8.9 - 91.4 mg/dL   Total Protein 6.8 6.5 - 8.1 g/dL   Albumin 4.2 3.5 - 5.0 g/dL   AST 23 15 - 41 U/L   ALT 21 0 - 44 U/L   Alkaline Phosphatase 63 38 - 126 U/L   Total Bilirubin 0.8 0.3 - 1.2 mg/dL   GFR, Estimated >78 >29 mL/min   Anion gap 6 5 - 15      Assessment & Plan:   Problem List Items Addressed This Visit       Other   Bipolar affective disorder (HCC) - Primary    [facility] declined [facility], he is going to head over to [facility] and see if he can be seen there.       Other Visit Diagnoses     Dyshidrotic eczema       start kenalog cream   Relevant Medications   triamcinolone ointment (KENALOG) 0.5 %        Follow up plan: Return if symptoms worsen or fail to improve.

## 2022-12-14 DIAGNOSIS — Z419 Encounter for procedure for purposes other than remedying health state, unspecified: Secondary | ICD-10-CM | POA: Diagnosis not present

## 2022-12-18 ENCOUNTER — Other Ambulatory Visit: Payer: Self-pay

## 2022-12-18 DIAGNOSIS — E349 Endocrine disorder, unspecified: Secondary | ICD-10-CM

## 2022-12-21 NOTE — Addendum Note (Signed)
Addended by: Altamese Delway on: 12/21/2022 10:26 AM   Modules accepted: Orders

## 2022-12-24 ENCOUNTER — Other Ambulatory Visit: Payer: Self-pay | Admitting: Nurse Practitioner

## 2022-12-25 ENCOUNTER — Other Ambulatory Visit: Payer: Self-pay

## 2022-12-25 DIAGNOSIS — E349 Endocrine disorder, unspecified: Secondary | ICD-10-CM

## 2022-12-25 NOTE — Telephone Encounter (Signed)
Requested medication (s) are due for refill today: -  Requested medication (s) are on the active medication list: no  Last refill:  11/27/22 2.5 grams 3 RF   Future visit scheduled: no  Notes to clinic:  med not assigned to a protocol  Requested Prescriptions  Pending Prescriptions Disp Refills   Testosterone 1.62 % GEL [Pharmacy Med Name: Testosterone 1.62 % Transdermal Gel] 75 g 0    Sig: APPLY TWO PUMPS TO ARMS ONCE DAILY     Off-Protocol Failed - 12/24/2022  3:36 PM      Failed - Medication not assigned to a protocol, review manually.      Passed - Valid encounter within last 12 months    Recent Outpatient Visits           2 weeks ago Bipolar affective disorder, currently depressed, mild [facility])   [place] [facility] Berniece Salines, FNP   1 month ago Testosterone deficiency   [facility] Berniece Salines, FNP   1 month ago Lumbar radiculopathy   [facility] Berniece Salines, FNP       Future Appointments             In 1 month Altamese Catano, MD [facility]   In 1 month Gollan, Tollie Pizza, MD [facility]

## 2022-12-28 MED ORDER — TESTOSTERONE 40.5 MG/2.5GM (1.62%) TD GEL: 2.0000 | Freq: Every day | TRANSDERMAL | 3 refills | Status: DC

## 2022-12-31 ENCOUNTER — Other Ambulatory Visit: Payer: Self-pay | Admitting: Nurse Practitioner

## 2022-12-31 DIAGNOSIS — M5412 Radiculopathy, cervical region: Secondary | ICD-10-CM

## 2022-12-31 DIAGNOSIS — M47812 Spondylosis without myelopathy or radiculopathy, cervical region: Secondary | ICD-10-CM

## 2022-12-31 DIAGNOSIS — M5416 Radiculopathy, lumbar region: Secondary | ICD-10-CM

## 2022-12-31 DIAGNOSIS — G8929 Other chronic pain: Secondary | ICD-10-CM

## 2022-12-31 DIAGNOSIS — M47816 Spondylosis without myelopathy or radiculopathy, lumbar region: Secondary | ICD-10-CM

## 2022-12-31 DIAGNOSIS — M5030 Other cervical disc degeneration, unspecified cervical region: Secondary | ICD-10-CM

## 2023-01-01 ENCOUNTER — Other Ambulatory Visit: Payer: Self-pay

## 2023-01-01 DIAGNOSIS — M5412 Radiculopathy, cervical region: Secondary | ICD-10-CM

## 2023-01-01 DIAGNOSIS — M47812 Spondylosis without myelopathy or radiculopathy, cervical region: Secondary | ICD-10-CM

## 2023-01-01 DIAGNOSIS — G8929 Other chronic pain: Secondary | ICD-10-CM

## 2023-01-01 DIAGNOSIS — M5416 Radiculopathy, lumbar region: Secondary | ICD-10-CM

## 2023-01-01 DIAGNOSIS — M47816 Spondylosis without myelopathy or radiculopathy, lumbar region: Secondary | ICD-10-CM

## 2023-01-01 DIAGNOSIS — F3132 Bipolar disorder, current episode depressed, moderate: Secondary | ICD-10-CM | POA: Diagnosis not present

## 2023-01-01 DIAGNOSIS — M5030 Other cervical disc degeneration, unspecified cervical region: Secondary | ICD-10-CM

## 2023-01-01 MED ORDER — CYCLOBENZAPRINE HCL 10 MG PO TABS
10.0000 mg | ORAL_TABLET | Freq: Three times a day (TID) | ORAL | 1 refills | Status: DC | PRN
Start: 2023-01-01 — End: 2023-03-01

## 2023-01-01 NOTE — Telephone Encounter (Signed)
Requested medication (s) are due for refill today: yes  Requested medication (s) are on the active medication list: yes  Last refill:  10/27/22  Future visit scheduled: no  Notes to clinic:  Unable to refill per protocol, cannot delegate.      Requested Prescriptions  Pending Prescriptions Disp Refills   cyclobenzaprine (FLEXERIL) 10 MG tablet [Pharmacy Med Name: Cyclobenzaprine HCl 10 MG Oral Tablet] 90 tablet 0    Sig: TAKE 1 TABLET BY MOUTH THREE TIMES DAILY AS NEEDED FOR MUSCLE SPASM (THIS  CAN  MAKE  YOU  SLEEPY)     Not Delegated - Analgesics:  Muscle Relaxants Failed - 12/31/2022  2:03 PM      Failed - This refill cannot be delegated      Passed - Valid encounter within last 6 months    Recent Outpatient Visits           3 weeks ago Bipolar affective disorder, currently depressed, mild (HCC)   [place] [facility] Berniece Salines, FNP   1 month ago Testosterone deficiency   [facility] Berniece Salines, FNP   2 months ago Lumbar radiculopathy   [facility] Berniece Salines, FNP       Future Appointments             In 3 weeks Altamese , MD [facility] Endocrinology   In 4 weeks Gollan, Tollie Pizza, MD [facility] at [facility]

## 2023-01-07 DIAGNOSIS — F3132 Bipolar disorder, current episode depressed, moderate: Secondary | ICD-10-CM | POA: Diagnosis not present

## 2023-01-14 DIAGNOSIS — Z419 Encounter for procedure for purposes other than remedying health state, unspecified: Secondary | ICD-10-CM | POA: Diagnosis not present

## 2023-01-15 DIAGNOSIS — F3132 Bipolar disorder, current episode depressed, moderate: Secondary | ICD-10-CM | POA: Diagnosis not present

## 2023-01-19 ENCOUNTER — Encounter: Payer: Self-pay | Admitting: Nurse Practitioner

## 2023-01-22 ENCOUNTER — Other Ambulatory Visit (INDEPENDENT_AMBULATORY_CARE_PROVIDER_SITE_OTHER): Payer: Medicaid Other

## 2023-01-22 DIAGNOSIS — E349 Endocrine disorder, unspecified: Secondary | ICD-10-CM

## 2023-01-22 LAB — BASIC METABOLIC PANEL
BUN: 20 mg/dL (ref 6–23)
CO2: 27 mEq/L (ref 19–32)
Calcium: 9.4 mg/dL (ref 8.4–10.5)
Chloride: 101 mEq/L (ref 96–112)
Creatinine, Ser: 1.19 mg/dL (ref 0.40–1.50)
GFR: 71.5 mL/min (ref >60.00)
Glucose, Bld: 88 mg/dL (ref 70–99)
Potassium: 4.5 mEq/L (ref 3.5–5.1)
Sodium: 137 mEq/L (ref 135–145)

## 2023-01-22 LAB — FOLLICLE STIMULATING HORMONE: FSH: <0.2 m[IU]/mL — ABNORMAL LOW (ref 1.4–18.1)

## 2023-01-22 LAB — CORTISOL: Cortisol, Plasma: 6.7 ug/dL

## 2023-01-22 LAB — LUTEINIZING HORMONE: LH: <0.2 m[IU]/mL — ABNORMAL LOW (ref 1.50–9.30)

## 2023-01-22 LAB — T4, FREE: Free T4: 0.7 ng/dL (ref 0.60–1.60)

## 2023-01-22 LAB — TSH: TSH: 7.2 u[IU]/mL — ABNORMAL HIGH (ref 0.35–5.50)

## 2023-01-25 ENCOUNTER — Other Ambulatory Visit: Payer: Self-pay | Admitting: Nurse Practitioner

## 2023-01-25 ENCOUNTER — Ambulatory Visit: Payer: Medicaid Other | Admitting: "Endocrinology

## 2023-01-25 ENCOUNTER — Other Ambulatory Visit: Payer: Self-pay

## 2023-01-25 ENCOUNTER — Ambulatory Visit: Payer: Medicaid Other | Admitting: Nurse Practitioner

## 2023-01-25 ENCOUNTER — Encounter: Payer: Self-pay | Admitting: Nurse Practitioner

## 2023-01-25 VITALS — BP 132/80 | HR 92 | Temp 98.6°F | Resp 16 | Ht 71.0 in | Wt 244.4 lb

## 2023-01-25 DIAGNOSIS — E349 Endocrine disorder, unspecified: Secondary | ICD-10-CM

## 2023-01-25 DIAGNOSIS — M5030 Other cervical disc degeneration, unspecified cervical region: Secondary | ICD-10-CM | POA: Diagnosis not present

## 2023-01-25 DIAGNOSIS — G894 Chronic pain syndrome: Secondary | ICD-10-CM | POA: Diagnosis not present

## 2023-01-25 DIAGNOSIS — M5416 Radiculopathy, lumbar region: Secondary | ICD-10-CM

## 2023-01-25 DIAGNOSIS — M47816 Spondylosis without myelopathy or radiculopathy, lumbar region: Secondary | ICD-10-CM

## 2023-01-25 MED ORDER — TESTOSTERONE 40.5 MG/2.5GM (1.62%) TD GEL
2.0000 | Freq: Every day | TRANSDERMAL | 3 refills | Status: DC
Start: 2023-01-25 — End: 2023-03-19

## 2023-01-25 NOTE — Assessment & Plan Note (Signed)
Referral placed to pain management

## 2023-01-25 NOTE — Progress Notes (Signed)
BP 132/80   Pulse 92   Temp 98.6 F (37 C) (Oral)   Resp 16   Ht 5\' 11"  (1.803 m)   Wt 244 lb 6.4 oz (110.9 kg)   SpO2 99%   BMI 34.09 kg/m    Subjective:    Patient ID: Jake Elliott, male    DOB: 1971-03-08, 52 y.o.   MRN: [id number]  HPI: Jake Elliott is a 52 y.o. male  Chief Complaint  Patient presents with   Referral    Pain Management   Chronic neck and back pain/ DDD/ spondylosis/radiculopathy.  has seen neurosurgery and is not a candidate for surgery. He is unable to take NSAIDs due to eliquis and colitis. He was referred to pain management but he cancelled the appointment. He was also offered physical therapy but he declined. He was seen in the emergency department on 10/03/2022 requesting pain medication. He was offered non narcotic management and patient declined and left. He says he has started taking the ibuprofen, aleve, and BC powders again.  He has tried some different roots to help with the pain. He says he has been on Cymbalta before but it did not help. He says he has tried multiple things but nothing seems to help his pain. He says that flexeril does help. Also discussed that he could try alternative medicine like acupuncture and such. He declined. He is here today for referral to pain management.  He says he would like to go to [facility] in [place]. Referral placed.   Low testosterone: patient missed his appointment with endocrinology today. He reports he was running late and will now have to reschedule.  He says he is out of his testosterone cream, refill sent.  Patient reports that endocrinology will now not see him till the end of the month.   Relevant past medical, surgical, family and social history reviewed and updated as indicated. Interim medical history since our last visit reviewed. Allergies and medications reviewed and updated.  Review of Systems  Constitutional: Negative for fever or weight change.  Respiratory: Negative for cough  and shortness of breath.   Cardiovascular: Negative for chest pain or palpitations.  Gastrointestinal: Negative for abdominal pain, no bowel changes.  Musculoskeletal: Negative for gait problem or joint swelling. Positive for low back pain Skin: Negative for rash.  Neurological: Negative for dizziness or headache.  No other specific complaints in a complete review of systems (except as listed in HPI above).      Objective:    BP 132/80   Pulse 92   Temp 98.6 F (37 C) (Oral)   Resp 16   Ht 5\' 11"  (1.803 m)   Wt 244 lb 6.4 oz (110.9 kg)   SpO2 99%   BMI 34.09 kg/m   Wt Readings from Last 3 Encounters:  01/25/23 244 lb 6.4 oz (110.9 kg)  12/09/22 244 lb 14.4 oz (111.1 kg)  11/13/22 237 lb 8 oz (107.7 kg)    Physical Exam  Constitutional: Patient appears well-developed and well-nourished. Obese  No distress.  HEENT: head atraumatic, normocephalic, pupils equal and reactive to light, neck supple, throat within normal limits Cardiovascular: Normal rate, regular rhythm and normal heart sounds.  No murmur heard. No BLE edema. Pulmonary/Chest: Effort normal and breath sounds normal. No respiratory distress. Abdominal: Soft.  There is no tenderness. Psychiatric: Patient has a normal mood and affect. behavior is normal. Judgment and thought content normal.   Results for orders placed or performed in visit  on 01/22/23  Estradiol  Result Value Ref Range   Estradiol 26 < OR = 39 pg/mL  Cortisol  Result Value Ref Range   Cortisol, Plasma 6.7 ug/dL  Prolactin  Result Value Ref Range   Prolactin 7.9 2.0 - 18.0 ng/mL  Luteinizing hormone  Result Value Ref Range   LH <0.20 (L) 1.50 - 9.30 mIU/mL  Follicle stimulating hormone  Result Value Ref Range   FSH <0.2 (L) 1.4 - 18.1 mIU/ML  Testosterone,Free and Total  Result Value Ref Range   Testosterone 104 (L) 264 - 916 ng/dL   Testosterone, Free 1.0 (L) 6.8 - 21.5 pg/mL  T4, free  Result Value Ref Range   Free T4 0.70 0.60 - 1.60  ng/dL  TSH  Result Value Ref Range   TSH 7.20 (H) 0.35 - 5.50 uIU/mL  Growth hormone  Result Value Ref Range   Growth Hormone <0.1 < OR = 7.1 ng/mL  Basic metabolic panel  Result Value Ref Range   Sodium 137 135 - 145 mEq/L   Potassium 4.5 3.5 - 5.1 mEq/L   Chloride 101 96 - 112 mEq/L   CO2 27 19 - 32 mEq/L   Glucose, Bld 88 70 - 99 mg/dL   BUN 20 6 - 23 mg/dL   Creatinine, Ser 1.61 0.40 - 1.50 mg/dL   GFR 09.60 >45.40 mL/min   Calcium 9.4 8.4 - 10.5 mg/dL      Assessment & Plan:   Problem List Items Addressed This Visit       Nervous and Auditory   Lumbar radiculopathy    Referral placed to pain management.       Relevant Orders   Ambulatory referral to Pain Clinic     Musculoskeletal and Integument   Lumbar spondylosis - Primary    Referral placed to pain management.       Relevant Orders   Ambulatory referral to Pain Clinic   DDD (degenerative disc disease), cervical    Referral placed to pain management.       Relevant Orders   Ambulatory referral to Pain Clinic     Other   Chronic pain syndrome    Referral placed to pain management.       Other Visit Diagnoses     Testosterone deficiency       refill sent   Relevant Medications   Testosterone 40.5 MG/2.5GM (1.62%) GEL        Follow up plan: Return if symptoms worsen or fail to improve.

## 2023-01-26 ENCOUNTER — Telehealth: Payer: Self-pay

## 2023-01-26 NOTE — Telephone Encounter (Signed)
Calling regarding lab results asking does he need to reschedule his follow up appointment , please advise?

## 2023-01-27 NOTE — Telephone Encounter (Signed)
Requested medication (s) are due for refill today: yes  Requested medication (s) are on the active medication list: yes  Last refill:  01/25/23  Future visit scheduled: yes  Notes to clinic:  Pharmacy comment: Please clarify the quantity prescribed for this prescription.      Requested Prescriptions  Pending Prescriptions Disp Refills   Testosterone 1.62 % GEL [Pharmacy Med Name: TESTOSTERONE PMP 1.62% 75G GEL] 75 g 3    Sig: APPLY TWO PUMPS ONTO THE SKIN DAILY     Off-Protocol Failed - 01/25/2023  4:14 PM      Failed - Medication not assigned to a protocol, review manually.      Passed - Valid encounter within last 12 months    Recent Outpatient Visits           2 days ago Lumbar spondylosis   [facility] Della Goo F, FNP   1 month ago Bipolar affective disorder, currently depressed, mild [facility])   [facility] Berniece Salines, FNP   2 months ago Testosterone deficiency   [facility] Berniece Salines, FNP   3 months ago Lumbar radiculopathy   [facility] Berniece Salines, FNP       Future Appointments             In 2 days Gollan, Tollie Pizza, MD [facility] at [facility]

## 2023-01-27 NOTE — Progress Notes (Signed)
Cardiology Office Note  Date:  01/29/2023   ID:  Jake Elliott, Jake Elliott [birth year], MRN [id number]  PCP:  Berniece Salines, FNP   Chief Complaint  Patient presents with   Follow-up    Patient denies new or acute cardiac problems/concerns today.      HPI:  Jake Elliott is a 52 year old gentleman who has a history of  ADHD, insomnia and bipolar.  HOCM, myomectomy in 2008 ,  moderate LVH, septal hypertrophy cardiac catheterization June 2012 showing no significant coronary disease,  sleep apnea, unable to tolerate CPAP,  nonsustained VT Prior history of heavy opiate use for chronic pain Takes high-dose NSAIDs, aspirin for chronic pain and headaches/neck pain who presents for routine followup of his tachycardia and hypertension  Last seen by myself in clinic February 2022 In follow-up today reports he continues to struggle with several issues Low testosterone despite using testosterone gel Chronic GI issues Chronic pain issues, continues to take high-dose ibuprofen and BC powder daily Issues with his bipolar/depression/anxiety/ADHD He would like to go back on his Adderall for ADHD which has worked well for him in the past  Review of hospital records indicates history of PE was placed on Eliquis Presenting to the hospital April 2024 with abdominal pain, rectal bleeding There is recommended he take Cipro and Flagyl for colitis Currently not on Eliquis  Continues to smoke 1 to 2 packs/day Reports that he is stopped all other drugs of abuse  Periodic lightheadedness when he bends down and gets back up  EKG personally reviewed by myself on todays visit EKG Interpretation Date/Time:  Friday January 29 2023 16:31:39 EDT Ventricular Rate:  83 PR Interval:  154 QRS Duration:  134 QT Interval:  376 QTC Calculation: 441 R Axis:   -38  Text Interpretation: Normal sinus rhythm Left axis deviation Left ventricular hypertrophy with QRS widening ( Sokolow-Lyon , Cornell product ,  Romhilt-Estes ) Cannot rule out Septal infarct (cited on or before 30-Jun-2008) T wave abnormality, consider lateral ischemia When compared with ECG of 06-Jul-2022 19:34, QRS axis Shifted left ST no longer depressed in Inferior leads Nonspecific T wave abnormality no longer evident in Inferior leads T wave inversion less evident in Lateral leads Confirmed by Julien Nordmann [phone]) on 01/29/2023 4:59:53 PM   Of past medical history reviewed declined referral to hocm clinic and declined medications  MRI neck, performed at [facility] Prominent right central  disc osteophyte and uncovertebral joint hypertrophy at C5-6 causes compression of the spinal cord and severe narrowing also extending into the  right neural foramen (25:81). At C6-7, a disc osteophyte and uncovertebral  joint hypertrophy indents the spinal cord and causes mild canal stenosis.  No high-grade neural foraminal narrowing.[name]  History of anxiety, presenting with tightness in back, palpitations, fatigue, headaches  Recent Echocardiogram February 2021 reviewed on today's visit Normal ejection fraction, severe asymmetric left ventricular hypertrophy disproportionately affecting the septum SAM with trivial MR  Previously was on calcium channel blockers and beta blockers. 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    history of anxiety depression. He has had a long hospital course requiring assistance from psychiatry.   chronic pain syndrome.   Prior Holter monitor in 2013 showing normal sinus rhythm with APCs and PVCs   PMH:   has a past medical history of Angina at rest, Anxiety, Attention-deficit/hyperactivity disorder, Bipolar disorder (HCC), Depression, GI bleed, History of tobacco abuse, Hypertension, Hypertrophic cardiomyopathy (HCC), Peripheral neuropathy, and PTSD (post-traumatic stress disorder).  PSH:    Past Surgical History:  Procedure Laterality Date   CARDIAC CATHETERIZATION     11/14/2010    CORONARY ARTERY BYPASS GRAFT     for myomectomy for hypertrophic cardiomyopathy    Current Outpatient Medications  Medication Sig Dispense Refill   albuterol (VENTOLIN HFA) 108 (90 Base) MCG/ACT inhaler Inhale 2 puffs into the lungs every 6 (six) hours as needed for wheezing or shortness of breath. 8 g 2   cyclobenzaprine (FLEXERIL) 10 MG tablet Take 1 tablet (10 mg total) by mouth 3 (three) times daily as needed for muscle spasms. This can make you sleepy. 90 tablet 1   diphenhydrAMINE (BENADRYL) 25 MG tablet Take 3 tablets (75 mg total) by mouth at bedtime as needed. 90 tablet 1   metoprolol tartrate (LOPRESSOR) 25 MG tablet Take 12.5 mg by mouth at bedtime.     pantoprazole (PROTONIX) 40 MG tablet Take 1 tablet (40 mg total) by mouth 2 (two) times daily. (Patient taking differently: Take 40 mg by mouth daily.) 180 tablet 1   tadalafil (CIALIS) 20 MG tablet Take 1 tablet (20 mg total) by mouth daily as needed for erectile dysfunction. 30 tablet 6   Testosterone 40.5 MG/2.5GM (1.62%) GEL Place 2 Pump onto the skin daily. 2.5 g 3   triamcinolone ointment (KENALOG) 0.5 % Apply 1 Application topically 2 (two) times daily. 30 g 3   verapamil (CALAN) 120 MG tablet Take 120 mg by mouth 2 (two) times daily.     No current facility-administered medications for this visit.   Allergies:   Patient has no known allergies.   Social History:  The patient  reports that he has been smoking cigarettes. He started smoking about 34 years ago. He has a 68.2 pack-year smoking history. He has never used smokeless tobacco. He reports that he does not drink alcohol and does not use drugs.   Family History:   family history includes Alcohol abuse in his father and mother; Cancer - Lung in his mother; Drug abuse in his father, mother, and sister; Hypertension in his father; Skin cancer in his mother; Stroke in his father.    Review of Systems: Review of Systems  Constitutional: Negative.   HENT: Negative.     Respiratory: Negative.    Cardiovascular: Negative.   Gastrointestinal: Negative.   Musculoskeletal: Negative.   Neurological:  Positive for headaches.  Psychiatric/Behavioral: Negative.    All other systems reviewed and are negative.  PHYSICAL EXAM: VS:  BP 132/88 (BP Location: Left Arm, Patient Position: Sitting, Cuff Size: Large)   Pulse 83   Ht 5\' 11"  (1.803 m)   Wt 241 lb 12.8 oz (109.7 kg)   SpO2 96%   BMI 33.72 kg/m  , BMI Body mass index is 33.72 kg/m. Constitutional:  oriented to person, place, and time. No distress.  HENT:  Head: Grossly normal Eyes:  no discharge. No scleral icterus.  Neck: No JVD, no carotid bruits  Cardiovascular: Regular rate and rhythm, no murmurs appreciated Pulmonary/Chest: Clear to auscultation bilaterally, no wheezes or rails Abdominal: Soft.  no distension.  no tenderness.  Musculoskeletal: Normal range of motion Neurological:  normal muscle tone. Coordination normal. No atrophy Skin: Skin warm and dry Psychiatric: normal affect, pleasant   Recent Labs: 09/26/2022: Magnesium 2.2 10/30/2022: ALT 21; Hemoglobin 15.0; Platelets 206 01/22/2023: BUN 20; Creatinine, Ser 1.19; Potassium 4.5; Sodium 137; TSH 7.20    Lipid Panel Lab Results  Component Value Date   CHOL 217 (H) 10/30/2022  HDL 40 (L) 10/30/2022   LDLCALC 121 (H) 10/30/2022   TRIG 280 (H) 10/30/2022    Wt Readings from Last 3 Encounters:  01/29/23 241 lb 12.8 oz (109.7 kg)  01/25/23 244 lb 6.4 oz (110.9 kg)  12/09/22 244 lb 14.4 oz (111.1 kg)    ASSESSMENT AND PLAN:  Frequent headaches -  On NSAIDS, and BC powder daily for chronic pain and headaches  Hyperlipidemia, unspecified hyperlipidemia type - Previously declined statin  Hypertrophic obstructive cardiomyopathy (HCC) - Previous myomectomy,2 cm septal wall,  Prior echocardiogram with high outflow tract gradient especially with Valsalva up to 60 mmHg Tolerating verapamil twice daily with low-dose metoprolol in  the evening Previously discussed Camzyos.  Previously declined referral to hocm clinic  Essential hypertension Blood pressure is well controlled on today's visit. No changes made to the medications.  Tachycardia on verapamil BID Metoprolol in the PM  Chronic pain syndrome, headaches previously on high-dose narcotics takes large amounts of NSAIDs including aspirin, Aleve  Total encounter time more than 30 minutes Greater than 50% was spent in counseling and coordination of care with the patient    Orders Placed This Encounter  Procedures   EKG 12-Lead     Signed, Dossie Arbour, M.D., Ph.D. 01/29/2023  [facility] [place], [place] [phone]

## 2023-01-27 NOTE — Telephone Encounter (Signed)
I spoke to Mr Joswiak he is coming up with a lot of excuses as to why he can't reschedule his appointment I talked him into speaking with the front office staff to schedule his appointment and follow up for Thyroid issues

## 2023-01-28 DIAGNOSIS — F3132 Bipolar disorder, current episode depressed, moderate: Secondary | ICD-10-CM | POA: Diagnosis not present

## 2023-01-29 ENCOUNTER — Encounter: Payer: Self-pay | Admitting: Cardiovascular Disease

## 2023-01-29 ENCOUNTER — Ambulatory Visit: Payer: Medicaid Other | Admitting: Cardiovascular Disease

## 2023-01-29 VITALS — BP 132/88 | HR 83 | Ht 71.0 in | Wt 241.8 lb

## 2023-01-29 DIAGNOSIS — I421 Obstructive hypertrophic cardiomyopathy: Secondary | ICD-10-CM

## 2023-01-29 DIAGNOSIS — R5381 Other malaise: Secondary | ICD-10-CM | POA: Diagnosis not present

## 2023-01-29 DIAGNOSIS — E782 Mixed hyperlipidemia: Secondary | ICD-10-CM

## 2023-01-29 DIAGNOSIS — R5383 Other fatigue: Secondary | ICD-10-CM | POA: Diagnosis not present

## 2023-01-29 DIAGNOSIS — F319 Bipolar disorder, unspecified: Secondary | ICD-10-CM | POA: Diagnosis not present

## 2023-01-29 DIAGNOSIS — I10 Essential (primary) hypertension: Secondary | ICD-10-CM

## 2023-01-29 NOTE — Patient Instructions (Signed)

## 2023-01-31 ENCOUNTER — Encounter: Payer: Self-pay | Admitting: Student in an Organized Health Care Education/Training Program

## 2023-02-01 ENCOUNTER — Encounter: Payer: Self-pay | Admitting: "Endocrinology

## 2023-02-01 ENCOUNTER — Telehealth: Payer: Self-pay | Admitting: Nurse Practitioner

## 2023-02-01 ENCOUNTER — Ambulatory Visit (INDEPENDENT_AMBULATORY_CARE_PROVIDER_SITE_OTHER): Payer: Medicaid Other | Admitting: "Endocrinology

## 2023-02-01 VITALS — BP 130/90 | HR 96 | Ht 71.0 in | Wt 245.2 lb

## 2023-02-01 DIAGNOSIS — E291 Testicular hypofunction: Secondary | ICD-10-CM | POA: Diagnosis not present

## 2023-02-01 DIAGNOSIS — E038 Other specified hypothyroidism: Secondary | ICD-10-CM | POA: Diagnosis not present

## 2023-02-01 DIAGNOSIS — Z6834 Body mass index (BMI) 34.0-34.9, adult: Secondary | ICD-10-CM

## 2023-02-01 DIAGNOSIS — E6609 Other obesity due to excess calories: Secondary | ICD-10-CM | POA: Diagnosis not present

## 2023-02-01 MED ORDER — LEVOTHYROXINE SODIUM 100 MCG PO TABS: 100.0000 ug | ORAL_TABLET | Freq: Every day | ORAL | 1 refills | Status: DC

## 2023-02-01 NOTE — Telephone Encounter (Signed)
FYI

## 2023-02-01 NOTE — Progress Notes (Signed)
The patient reports they are currently: [place]. I spent 30 minutes on the video with the patient on the date of service. I spent an additional 15 minutes on pre- and post-visit activities on the date of service.   The patient was physically located in [place] or a state in which I am permitted to provide care. The patient and/or parent/guardian understood that s/he may incur co-pays and cost sharing, and agreed to the telemedicine visit. The visit was reasonable and appropriate under the circumstances given the patient's presentation at the time.  The patient and/or parent/guardian has been advised of the potential risks and limitations of this mode of treatment (including, but not limited to, the absence of in-person examination) and has agreed to be treated using telemedicine. The patient's/patient's family's questions regarding telemedicine have been answered.   The patient and/or parent/guardian has also been advised to contact their provider's office for worsening conditions, and seek emergency medical treatment and/or call 911 if the patient deems either necessary.     Outpatient Endocrinology Note Jake Coto de Caza, MD    Jake Elliott [facility] 18-Oct-1970 [id number]  Referring Provider: Berniece Salines, FNP Primary Care Provider: Berniece Salines, FNP Reason for consultation: Subjective   Assessment & Plan  Diagnoses and all orders for this visit:  Hypogonadism in male -     Cortisol; Future -     T4, free; Future -     Testosterone,Free and Total; Future -     CBC with Differential/Platelet; Future -     Follicle stimulating hormone; Future -     Luteinizing hormone; Future  Subclinical hypothyroidism -     TSH; Future  Class 1 obesity due to excess calories with serious comorbidity and body mass index (BMI) of 34.0 to 34.9 in adult  Other orders -     levothyroxine (SYNTHROID) 100 MCG tablet; Take 1 tablet (100 mcg total) by mouth daily.   On Testosterone 40.5  MG/2.5GM (1.62%) GEL 2 pumps a day, for 3 mo without adequate testosterone levels Tried testosterone shots in past and felt better with those, prefers shots No 8 am testosterone levels in system Recommend holding off testosterone gel with 8 am blood work, may need MRI pituitary based on levels, will proceed with testicular exam and testosterone shots after blood work as needed CBC to r/o polycythemia last Hb 15 Potential S/E including worsening of: heart disease, lower urinary tracts symptoms, polycythemia and sleep apnea; patient would like to proceed with treatment nonetheless and understands the risks   Recommend follow up to treat sleep apnea as needed, pt has history of sleep apnea, but reports better sleep now and no more choking/gasping for air at night/snoring spells but doesn't use CPAP  History of hypothyroidism in past requiring levothyroxine Has not taken it in past 15-10 years TSH at 7, with low normal FT4 Recommend starting levothyroxine po every day  Recommend to take levothyroxine first thing in the morning on empty stomach and wait at least 30 minutes to 1 hour before eating or drinking anything or taking any other medications. Space out levothyroxine by 4 hours from any acid reflux medication, fibrate, iron, calcium, multivitamin and nutritional supplements.    Return in about 5 weeks (around 03/10/2023).   I have reviewed current medications, nurse's notes, allergies, vital signs, past medical and surgical history, family medical history, and social history for this encounter. Counseled patient on symptoms, examination findings, lab findings, imaging results, treatment decisions and monitoring and prognosis. The  patient understood the recommendations and agrees with the treatment plan. All questions regarding treatment plan were fully answered.  Jake Monroe, MD  02/01/23   History of Present Illness HPI   Jake Elliott is a 52 y.o. male with PMH of  hypogonadism referred by Dr. Zane Herald for evaluation and management of hypogonadism.   Patient reports being diagnosed of hypogonadism in his 36s and was put on testosterone shots that he took for a year. Had a move and got off of testosterone. He was also put on levothyroxine. Last took 15-20 years ago.  Tiredness lead to diagnosis of low testosterone around 2022.  He has  No h/o mumps in childhood No head or genital trauma No use of OTC supplements, herbal supplements or anabolic steroids. Was in pain management for 8 years in past Yes poor libido No erectile dysfunction; Yes tried PDE5 Inhibitors; Yes improved with PDE5 Inhibitors No early morning spontaneous erection Yes stimulated penile erection Yes loss of energy or vim Yes irritability/low mood No change in sexual hair growth/shaving habit Yes change in muscle tone/mass Yes hot flashes Yes breast enlargement No nipple discharge  No decrease in testicular size Yes weight gain.  No has osteopenia/osteoporosis No history of low trauma fractures  Yes symptoms or history of obstructive sleep apnea, no CPAP No a history of chronic opioid use No BPH previously  Yes history of cardiovascular disease, has hypertrophic obstructive cardiomyopathy  No history of stroke No history of polycythemia Yes LUTS including - frequency, + hesitancy, + incomplete bladder emptying and - urgency No a previous diagnosis of prostate/testicular cancer   Physical Exam  BP (!) 130/90   Pulse 96   Ht 5\' 11"  (1.803 m)   Wt 245 lb 3.2 oz (111.2 kg)   SpO2 99%   BMI 34.20 kg/m    Constitutional: well developed, well nourished Head: normocephalic, atraumatic Eyes: sclera anicteric, no redness Neck: supple Lungs: normal respiratory effort Neurology: alert and oriented Skin: dry, no appreciable rashes Musculoskeletal: no appreciable defects Psychiatric: normal mood and affect   Current Medications Patient's Medications  New Prescriptions    LEVOTHYROXINE (SYNTHROID) 100 MCG TABLET    Take 1 tablet (100 mcg total) by mouth daily.  Previous Medications   ALBUTEROL (VENTOLIN HFA) 108 (90 BASE) MCG/ACT INHALER    Inhale 2 puffs into the lungs every 6 (six) hours as needed for wheezing or shortness of breath.   CYCLOBENZAPRINE (FLEXERIL) 10 MG TABLET    Take 1 tablet (10 mg total) by mouth 3 (three) times daily as needed for muscle spasms. This can make you sleepy.   DIPHENHYDRAMINE (BENADRYL) 25 MG TABLET    Take 3 tablets (75 mg total) by mouth at bedtime as needed.   METOPROLOL TARTRATE (LOPRESSOR) 25 MG TABLET    Take 12.5 mg by mouth at bedtime.   PANTOPRAZOLE (PROTONIX) 40 MG TABLET    Take 1 tablet (40 mg total) by mouth 2 (two) times daily.   TADALAFIL (CIALIS) 20 MG TABLET    Take 1 tablet (20 mg total) by mouth daily as needed for erectile dysfunction.   TESTOSTERONE 40.5 MG/2.5GM (1.62%) GEL    Place 2 Pump onto the skin daily.   TRIAMCINOLONE OINTMENT (KENALOG) 0.5 %    Apply 1 Application topically 2 (two) times daily.   VERAPAMIL (CALAN) 120 MG TABLET    Take 120 mg by mouth 2 (two) times daily.  Modified Medications   No medications on file  Discontinued Medications  No medications on file    Allergies No Known Allergies  Past Medical History Past Medical History:  Diagnosis Date   Angina at rest    Anxiety    Attention-deficit/hyperactivity disorder    Bipolar disorder (HCC)    Depression    GI bleed    History of tobacco abuse    Hypertension    Hypertrophic cardiomyopathy (HCC)    s/p myomectomy   Peripheral neuropathy    PTSD (post-traumatic stress disorder)     Past Surgical History Past Surgical History:  Procedure Laterality Date   CARDIAC CATHETERIZATION     11/14/2010   CORONARY ARTERY BYPASS GRAFT     for myomectomy for hypertrophic cardiomyopathy    Family History family history includes Alcohol abuse in his father and mother; Cancer - Lung in his mother; Drug abuse in his father,  mother, and sister; Hypertension in his father; Skin cancer in his mother; Stroke in his father.  Social History Social History   Socioeconomic History   Marital status: Single    Spouse name: Not on file   Number of children: Not on file   Years of education: Not on file   Highest education level: Not on file  Occupational History   Not on file  Tobacco Use   Smoking status: Every Day    Current packs/day: 2.00    Average packs/day: 2.0 packs/day for 34.1 years (68.2 ttl pk-yrs)    Types: Cigarettes    Start date: 12/31/1988   Smokeless tobacco: Never   Tobacco comments:    1 PPD (currently smoking)  Vaping Use   Vaping status: Never Used  Substance and Sexual Activity   Alcohol use: No    Alcohol/week: 0.0 standard drinks of alcohol   Drug use: No   Sexual activity: Yes    Partners: Female    Birth control/protection: None  Other Topics Concern   Not on file  Social History Narrative   Not on file   Social Determinants of Health   Financial Resource Strain: Not on file  Food Insecurity: No Food Insecurity (09/26/2022)   Hunger Vital Sign    Worried About Running Out of Food in the Last Year: Never true    Ran Out of Food in the Last Year: Never true  Transportation Needs: No Transportation Needs (09/26/2022)   PRAPARE - Administrator, Civil Service (Medical): No    Lack of Transportation (Non-Medical): No  Physical Activity: Not on file  Stress: Not on file  Social Connections: Not on file  Intimate Partner Violence: Not At Risk (09/26/2022)   Humiliation, Afraid, Rape, and Kick questionnaire    Fear of Current or Ex-Partner: No    Emotionally Abused: No    Physically Abused: No    Sexually Abused: No    Lab Results  Component Value Date   CHOL 217 (H) 10/30/2022   Lab Results  Component Value Date   HDL 40 (L) 10/30/2022   Lab Results  Component Value Date   LDLCALC 121 (H) 10/30/2022   Lab Results  Component Value Date   TRIG 280 (H)  10/30/2022   Lab Results  Component Value Date   CHOLHDL 5.4 10/30/2022   Lab Results  Component Value Date   CREATININE 1.19 01/22/2023   Lab Results  Component Value Date   GFR 71.50 01/22/2023      Component Value Date/Time   NA 137 01/22/2023 1000   NA 138 07/05/2019 1530  NA 134 (L) 06/14/2013 2021   K 4.5 01/22/2023 1000   K 3.4 (L) 06/14/2013 2021   CL 101 01/22/2023 1000   CL 102 06/14/2013 2021   CO2 27 01/22/2023 1000   CO2 24 06/14/2013 2021   GLUCOSE 88 01/22/2023 1000   GLUCOSE 103 (H) 06/14/2013 2021   BUN 20 01/22/2023 1000   BUN 21 07/05/2019 1530   BUN 10 06/14/2013 2021   CREATININE 1.19 01/22/2023 1000   CREATININE 0.92 06/14/2013 2021   CALCIUM 9.4 01/22/2023 1000   CALCIUM 8.8 06/14/2013 2021   PROT 6.8 10/30/2022 1434   PROT 6.8 07/05/2019 1530   PROT 7.4 06/14/2013 2021   ALBUMIN 4.2 10/30/2022 1434   ALBUMIN 4.5 07/05/2019 1530   ALBUMIN 3.6 06/14/2013 2021   AST 23 10/30/2022 1434   AST 14 (L) 06/14/2013 2021   ALT 21 10/30/2022 1434   ALT 14 06/14/2013 2021   ALKPHOS 63 10/30/2022 1434   ALKPHOS 91 06/14/2013 2021   BILITOT 0.8 10/30/2022 1434   BILITOT 0.8 07/05/2019 1530   BILITOT 1.2 (H) 06/14/2013 2021   GFRNONAA >60 10/30/2022 1434   GFRNONAA >60 06/14/2013 2021   GFRAA >60 03/09/2020 1551   GFRAA >60 06/14/2013 2021      Latest Ref Rng & Units 01/22/2023   10:00 AM 10/30/2022    2:34 PM 09/26/2022    4:46 AM  BMP  Glucose 70 - 99 mg/dL 88  161  096   BUN 6 - 23 mg/dL 20  26  12    Creatinine 0.40 - 1.50 mg/dL 0.45  4.09  8.11   Sodium 135 - 145 mEq/L 137  140  138   Potassium 3.5 - 5.1 mEq/L 4.5  4.1  4.2   Chloride 96 - 112 mEq/L 101  109  103   CO2 19 - 32 mEq/L 27  25  26    Calcium 8.4 - 10.5 mg/dL 9.4  9.3  8.8        Component Value Date/Time   WBC 5.4 10/30/2022 1434   RBC 5.09 10/30/2022 1434   HGB 15.0 10/30/2022 1434   HGB CANCELED 07/05/2019 1530   HCT 44.4 10/30/2022 1434   HCT CANCELED 07/05/2019 1530    PLT 206 10/30/2022 1434   PLT CANCELED 07/05/2019 1530   MCV 87.2 10/30/2022 1434   MCV 82 06/14/2013 2021   MCH 29.5 10/30/2022 1434   MCHC 33.8 10/30/2022 1434   RDW 11.9 10/30/2022 1434   RDW 13.1 06/14/2013 2021   LYMPHSABS 1.0 10/30/2022 1434   LYMPHSABS CANCELED 07/05/2019 1530   LYMPHSABS 1.1 06/14/2013 2021   MONOABS 0.4 10/30/2022 1434   MONOABS 1.0 06/14/2013 2021   EOSABS 0.1 10/30/2022 1434   EOSABS CANCELED 07/05/2019 1530   EOSABS 0.3 06/14/2013 2021   BASOSABS 0.1 10/30/2022 1434   BASOSABS CANCELED 07/05/2019 1530   BASOSABS 0.1 06/14/2013 2021   Lab Results  Component Value Date   TSH 7.20 (H) 01/22/2023   TSH 2.652 10/30/2022   TSH 3.414 01/02/2022   FREET4 0.70 01/22/2023         Parts of this note may have been dictated using voice recognition software. There may be variances in spelling and vocabulary which are unintentional. Not all errors are proofread. Please notify the Thereasa Parkin if any discrepancies are noted or if the meaning of any statement is not clear.

## 2023-02-01 NOTE — Telephone Encounter (Unsigned)
Copied from CRM [id number]. Topic: General - Inquiry >> Feb 01, 2023  2:29 PM Haroldine Laws wrote: Reason for CRM: pt called saying he went to Endocrinology and he would like to go over what they talked about to Della Goo,  [phone]  [phone]-

## 2023-02-01 NOTE — Telephone Encounter (Signed)
There is not a full report of that echocardiogram available for me to review.  The echocardiogram findings are best discussed with cardiology as he does have a complex cardiac history.

## 2023-02-01 NOTE — Telephone Encounter (Signed)
Dr. Basilia Jumbo can you advise since Dr. Aundria Rud is out of the office. Thank you!

## 2023-02-02 NOTE — Telephone Encounter (Signed)
The EKG tracing is consistent with his cardiac issues.  He needs to discuss this with his cardiologist.  There is nothing in this EKG that indicates anything to do with his pulmonary thrombus.

## 2023-02-03 ENCOUNTER — Encounter: Payer: Self-pay | Admitting: Cardiovascular Disease

## 2023-02-10 ENCOUNTER — Telehealth: Payer: Self-pay | Admitting: Nurse Practitioner

## 2023-02-10 NOTE — Telephone Encounter (Signed)
Left message for Somya to call back.

## 2023-02-10 NOTE — Telephone Encounter (Signed)
Copied from CRM [id number]. Topic: General - Inquiry >> Feb 10, 2023  1:06 PM De Blanch wrote: Reason for CRM: Ladean Raya from the [facility] stated they received the patient's referral.  Ladean Raya is asking for a discharge reason. Please advise.

## 2023-02-11 ENCOUNTER — Encounter: Payer: Self-pay | Admitting: "Endocrinology

## 2023-02-14 DIAGNOSIS — Z419 Encounter for procedure for purposes other than remedying health state, unspecified: Secondary | ICD-10-CM | POA: Diagnosis not present

## 2023-02-26 ENCOUNTER — Encounter: Payer: Self-pay | Admitting: Nurse Practitioner

## 2023-02-26 ENCOUNTER — Other Ambulatory Visit: Payer: Self-pay | Admitting: Nurse Practitioner

## 2023-02-26 ENCOUNTER — Encounter: Payer: Self-pay | Admitting: Cardiovascular Disease

## 2023-02-26 ENCOUNTER — Other Ambulatory Visit: Payer: Self-pay | Admitting: Cardiovascular Disease

## 2023-02-26 ENCOUNTER — Other Ambulatory Visit: Payer: Self-pay

## 2023-02-26 DIAGNOSIS — M47816 Spondylosis without myelopathy or radiculopathy, lumbar region: Secondary | ICD-10-CM

## 2023-02-26 DIAGNOSIS — M5412 Radiculopathy, cervical region: Secondary | ICD-10-CM

## 2023-02-26 DIAGNOSIS — M5030 Other cervical disc degeneration, unspecified cervical region: Secondary | ICD-10-CM

## 2023-02-26 DIAGNOSIS — M5416 Radiculopathy, lumbar region: Secondary | ICD-10-CM

## 2023-02-26 DIAGNOSIS — J439 Emphysema, unspecified: Secondary | ICD-10-CM

## 2023-02-26 DIAGNOSIS — M47812 Spondylosis without myelopathy or radiculopathy, cervical region: Secondary | ICD-10-CM

## 2023-02-26 DIAGNOSIS — G8929 Other chronic pain: Secondary | ICD-10-CM

## 2023-02-26 NOTE — Telephone Encounter (Signed)
Medication Refill - Medication: albuterol (VENTOLIN HFA) 108 (90 Base) MCG/ACT inhaler [[id number]] cyclobenzaprine (FLEXERIL) 10 MG tablet [[id number]]   Has the patient contacted their pharmacy? Yes.     (Agent: If yes, when and what did the pharmacy advise?) Contact PCP   Preferred Pharmacy (with phone number or street name): [facility] [address]   Has the patient been seen for an appointment in the last year OR does the patient have an upcoming appointment? Yes.    Agent: Please be advised that RX refills may take up to 3 business days. We ask that you follow-up with your pharmacy.   Pt is out of the albuterol.

## 2023-03-01 MED ORDER — CYCLOBENZAPRINE HCL 10 MG PO TABS
10.0000 mg | ORAL_TABLET | Freq: Three times a day (TID) | ORAL | 1 refills | Status: DC | PRN
Start: 2023-03-01 — End: 2023-04-27

## 2023-03-01 MED ORDER — ALBUTEROL SULFATE HFA 108 (90 BASE) MCG/ACT IN AERS: 2.0000 | INHALATION_SPRAY | Freq: Four times a day (QID) | RESPIRATORY_TRACT | 2 refills | Status: DC | PRN

## 2023-03-01 NOTE — Telephone Encounter (Signed)
Requested medication (s) are due for refill today: yes  Requested medication (s) are on the active medication list: yes  Last refill:  01/01/23  Future visit scheduled: no  Notes to clinic:  Unable to refill per protocol, cannot delegate.      Requested Prescriptions  Pending Prescriptions Disp Refills   cyclobenzaprine (FLEXERIL) 10 MG tablet [Pharmacy Med Name: Cyclobenzaprine HCl 10 MG Oral Tablet] 90 tablet 0    Sig: TAKE 1 TABLET BY MOUTH THREE TIMES DAILY AS NEEDED FOR MUSCLE SPASM (THIS  CAN  MAKE  YOU  SLEEPY)     Not Delegated - Analgesics:  Muscle Relaxants Failed - 02/26/2023  2:49 PM      Failed - This refill cannot be delegated      Passed - Valid encounter within last 6 months    Recent Outpatient Visits           1 month ago Lumbar spondylosis   [facility] Berniece Salines, FNP   2 months ago Bipolar affective disorder, currently depressed, mild [facility])   [facility] Berniece Salines, FNP   3 months ago Testosterone deficiency   [facility] Berniece Salines, FNP   4 months ago Lumbar radiculopathy   [facility] Berniece Salines, [place]

## 2023-03-01 NOTE — Telephone Encounter (Signed)
Requested Prescriptions  Pending Prescriptions Disp Refills   albuterol (VENTOLIN HFA) 108 (90 Base) MCG/ACT inhaler 8 g 2    Sig: Inhale 2 puffs into the lungs every 6 (six) hours as needed for wheezing or shortness of breath.     Pulmonology:  Beta Agonists 2 Failed - 03/01/2023  8:58 AM      Failed - Last BP in normal range    BP Readings from Last 1 Encounters:  02/01/23 (!) 130/90         Passed - Last Heart Rate in normal range    Pulse Readings from Last 1 Encounters:  02/01/23 96         Passed - Valid encounter within last 12 months    Recent Outpatient Visits           1 month ago Lumbar spondylosis   [facility] Della Goo F, FNP   2 months ago Bipolar affective disorder, currently depressed, mild [facility])   [facility] Berniece Salines, FNP   3 months ago Testosterone deficiency   [facility] Berniece Salines, FNP   4 months ago Lumbar radiculopathy   [facility] Berniece Salines, FNP               cyclobenzaprine (FLEXERIL) 10 MG tablet 90 tablet 1    Sig: Take 1 tablet (10 mg total) by mouth 3 (three) times daily as needed for muscle spasms. This can make you sleepy.     Not Delegated - Analgesics:  Muscle Relaxants Failed - 03/01/2023  8:58 AM      Failed - This refill cannot be delegated      Passed - Valid encounter within last 6 months    Recent Outpatient Visits           1 month ago Lumbar spondylosis   [facility] Della Goo F, FNP   2 months ago Bipolar affective disorder, currently depressed, mild [facility])   [facility] Berniece Salines, FNP   3 months ago Testosterone deficiency   [facility] Berniece Salines, FNP   4 months ago Lumbar radiculopathy   [facility] Berniece Salines, [place]

## 2023-03-01 NOTE — Telephone Encounter (Signed)
Requested medications are due for refill today.  yes  Requested medications are on the active medications list.  yes  Last refill. 01/01/2023 #90 1 rf  Future visit scheduled.   no  Notes to clinic.  Refill not delegated.    Requested Prescriptions  Pending Prescriptions Disp Refills   cyclobenzaprine (FLEXERIL) 10 MG tablet 90 tablet 1    Sig: Take 1 tablet (10 mg total) by mouth 3 (three) times daily as needed for muscle spasms. This can make you sleepy.     Not Delegated - Analgesics:  Muscle Relaxants Failed - 03/01/2023  8:58 AM      Failed - This refill cannot be delegated      Passed - Valid encounter within last 6 months    Recent Outpatient Visits           1 month ago Lumbar spondylosis   [facility] Della Goo F, FNP   2 months ago Bipolar affective disorder, currently depressed, mild [facility])   [facility] Berniece Salines, FNP   3 months ago Testosterone deficiency   [facility] Berniece Salines, FNP   4 months ago Lumbar radiculopathy   [facility] Berniece Salines, [place]              Signed Prescriptions Disp Refills   albuterol (VENTOLIN HFA) 108 (90 Base) MCG/ACT inhaler 8 g 2    Sig: Inhale 2 puffs into the lungs every 6 (six) hours as needed for wheezing or shortness of breath.     Pulmonology:  Beta Agonists 2 Failed - 03/01/2023  8:58 AM      Failed - Last BP in normal range    BP Readings from Last 1 Encounters:  02/01/23 (!) 130/90         Passed - Last Heart Rate in normal range    Pulse Readings from Last 1 Encounters:  02/01/23 96         Passed - Valid encounter within last 12 months    Recent Outpatient Visits           1 month ago Lumbar spondylosis   [facility] Della Goo F, FNP   2 months ago Bipolar affective disorder, currently depressed, mild [facility])   [facility]  Berniece Salines, FNP   3 months ago Testosterone deficiency   [facility] Berniece Salines, FNP   4 months ago Lumbar radiculopathy   [facility] Berniece Salines, [place]

## 2023-03-08 ENCOUNTER — Other Ambulatory Visit: Payer: Self-pay | Admitting: "Endocrinology

## 2023-03-10 ENCOUNTER — Ambulatory Visit: Payer: Medicaid Other | Admitting: "Endocrinology

## 2023-03-11 ENCOUNTER — Other Ambulatory Visit
Admission: RE | Admit: 2023-03-11 | Discharge: 2023-03-11 | Disposition: A | Discharge status: Home / Self Care | Payer: Medicaid Other | Attending: "Endocrinology | Admitting: "Endocrinology

## 2023-03-11 DIAGNOSIS — E038 Other specified hypothyroidism: Secondary | ICD-10-CM | POA: Diagnosis not present

## 2023-03-11 DIAGNOSIS — E291 Testicular hypofunction: Secondary | ICD-10-CM | POA: Insufficient documentation

## 2023-03-11 LAB — CBC WITH DIFFERENTIAL/PLATELET
Abs Immature Granulocytes: 0.02 10*3/uL (ref 0.00–0.07)
Basophils Absolute: 0.1 10*3/uL (ref 0.0–0.1)
Basophils Relative: 1 %
Eosinophils Absolute: 0.3 10*3/uL (ref 0.0–0.5)
Eosinophils Relative: 3 %
HCT: 54.6 % — ABNORMAL HIGH (ref 39.0–52.0)
Hemoglobin: 19 g/dL — ABNORMAL HIGH (ref 13.0–17.0)
Immature Granulocytes: 0 %
Lymphocytes Relative: 18 %
Lymphs Abs: 1.6 10*3/uL (ref 0.7–4.0)
MCH: 30.4 pg (ref 26.0–34.0)
MCHC: 34.8 g/dL (ref 30.0–36.0)
MCV: 87.2 fL (ref 80.0–100.0)
Monocytes Absolute: 0.7 10*3/uL (ref 0.1–1.0)
Monocytes Relative: 8 %
Neutro Abs: 6.1 10*3/uL (ref 1.7–7.7)
Neutrophils Relative %: 70 %
Platelets: 205 10*3/uL (ref 150–400)
RBC: 6.26 MIL/uL — ABNORMAL HIGH (ref 4.22–5.81)
RDW: 12 % (ref 11.5–15.5)
WBC: 8.7 10*3/uL (ref 4.0–10.5)
nRBC: 0 % (ref 0.0–0.2)

## 2023-03-11 LAB — TSH: TSH: 4.372 u[IU]/mL (ref 0.350–4.500)

## 2023-03-11 LAB — T4, FREE: Free T4: 0.95 ng/dL (ref 0.61–1.12)

## 2023-03-11 LAB — CORTISOL: Cortisol, Plasma: 11.9 ug/dL

## 2023-03-11 NOTE — Addendum Note (Signed)
Addended by: Lonell Face C on: 03/11/2023 08:06 AM   Modules accepted: Orders

## 2023-03-11 NOTE — Addendum Note (Signed)
Addended by: Lonell Face C on: 03/11/2023 08:05 AM   Modules accepted: Orders

## 2023-03-12 LAB — FOLLICLE STIMULATING HORMONE: FSH: <0.3 m[IU]/mL — ABNORMAL LOW (ref 1.5–12.4)

## 2023-03-12 LAB — LUTEINIZING HORMONE: LH: <0.3 m[IU]/mL — ABNORMAL LOW (ref 1.7–8.6)

## 2023-03-14 ENCOUNTER — Emergency Department
Admission: EM | Admit: 2023-03-14 | Discharge: 2023-03-14 | Disposition: A | Discharge status: Home / Self Care | Payer: Medicaid Other | Attending: Emergency Medicine | Admitting: Emergency Medicine

## 2023-03-14 ENCOUNTER — Other Ambulatory Visit: Payer: Self-pay

## 2023-03-14 ENCOUNTER — Emergency Department: Payer: Medicaid Other

## 2023-03-14 DIAGNOSIS — R1012 Left upper quadrant pain: Secondary | ICD-10-CM | POA: Insufficient documentation

## 2023-03-14 DIAGNOSIS — R0602 Shortness of breath: Secondary | ICD-10-CM | POA: Diagnosis not present

## 2023-03-14 DIAGNOSIS — R0789 Other chest pain: Secondary | ICD-10-CM | POA: Diagnosis not present

## 2023-03-14 DIAGNOSIS — R079 Chest pain, unspecified: Secondary | ICD-10-CM | POA: Diagnosis not present

## 2023-03-14 DIAGNOSIS — J449 Chronic obstructive pulmonary disease, unspecified: Secondary | ICD-10-CM | POA: Diagnosis not present

## 2023-03-14 LAB — CBC
HCT: 54.8 % — ABNORMAL HIGH (ref 39.0–52.0)
Hemoglobin: 18.8 g/dL — ABNORMAL HIGH (ref 13.0–17.0)
MCH: 29.9 pg (ref 26.0–34.0)
MCHC: 34.3 g/dL (ref 30.0–36.0)
MCV: 87.1 fL (ref 80.0–100.0)
Platelets: 210 10*3/uL (ref 150–400)
RBC: 6.29 MIL/uL — ABNORMAL HIGH (ref 4.22–5.81)
RDW: 12 % (ref 11.5–15.5)
WBC: 7.9 10*3/uL (ref 4.0–10.5)
nRBC: 0 % (ref 0.0–0.2)

## 2023-03-14 LAB — BASIC METABOLIC PANEL
Anion gap: 10 (ref 5–15)
BUN: 24 mg/dL — ABNORMAL HIGH (ref 6–20)
CO2: 24 mmol/L (ref 22–32)
Calcium: 9.6 mg/dL (ref 8.9–10.3)
Chloride: 106 mmol/L (ref 98–111)
Creatinine, Ser: 1.19 mg/dL (ref 0.61–1.24)
GFR, Estimated: >60 mL/min (ref >60)
Glucose, Bld: 89 mg/dL (ref 70–99)
Potassium: 4.5 mmol/L (ref 3.5–5.1)
Sodium: 140 mmol/L (ref 135–145)

## 2023-03-14 LAB — BRAIN NATRIURETIC PEPTIDE: B Natriuretic Peptide: 87.4 pg/mL (ref 0.0–100.0)

## 2023-03-14 LAB — TROPONIN I (HIGH SENSITIVITY)
Troponin I (High Sensitivity): 18 ng/L — ABNORMAL HIGH (ref <18)
Troponin I (High Sensitivity): 22 ng/L — ABNORMAL HIGH (ref <18)

## 2023-03-14 MED ORDER — PANTOPRAZOLE SODIUM 40 MG IV SOLR
40.0000 mg | Freq: Once | INTRAVENOUS | Status: AC
  Administered 2023-03-14: 40 mg via INTRAVENOUS
  Filled 2023-03-14: qty 10

## 2023-03-14 MED ORDER — SUCRALFATE 1 G PO TABS: 1.0000 g | ORAL_TABLET | Freq: Four times a day (QID) | ORAL | 1 refills | Status: DC

## 2023-03-14 MED ORDER — METOCLOPRAMIDE HCL 10 MG PO TABS: 10.0000 mg | ORAL_TABLET | Freq: Four times a day (QID) | ORAL | 0 refills | Status: AC | PRN

## 2023-03-14 NOTE — Discharge Instructions (Signed)
Your chest x-ray and lab test were all reassuring today.  Continue taking your medications as usual.  You can try adding on Carafate and Reglan to see if this helps your symptoms.

## 2023-03-14 NOTE — ED Provider Notes (Signed)
[facility] Provider Note    Event Date/Time   First MD Initiated Contact with Patient 03/14/23 1647     (approximate)   History   Chief Complaint: Chest Pain and Shortness of Breath   HPI  Jake Elliott is a 52 y.o. male  with pmh HOCM, bipolar disorder, anxiety who comesto the ED c/o chest pain and sob x 1 week. Worse with eating. Feels like burning. Intermittent. Not exertional, not pleuritic. No vomiting or diaphoresis. Non radiating.       Physical Exam   Triage Vital Signs: ED Triage Vitals  Encounter Vitals Group     BP 03/14/23 1632 (!) 150/93     Systolic BP Percentile --      Diastolic BP Percentile --      Pulse Rate 03/14/23 1632 91     Resp 03/14/23 1632 17     Temp 03/14/23 1632 97.8 F (36.6 C)     Temp Source 03/14/23 1632 Oral     SpO2 03/14/23 1632 94 %     Weight 03/14/23 1626 243 lb (110.2 kg)     Height 03/14/23 1626 5\' 11"  (1.803 m)     Head Circumference --      Peak Flow --      Pain Score 03/14/23 1626 6     Pain Loc --      Pain Education --      Exclude from Growth Chart --     Most recent vital signs: Vitals:   03/14/23 1651 03/14/23 1745  BP: (!) 152/102   Pulse: 79 71  Resp: (!) 21 19  Temp: 99 F (37.2 C)   SpO2: 96% 95%    General: Awake, no distress.  CV:  Good peripheral perfusion. RRR Resp:  Normal effort. ctab Abd:  No distention. Soft with mild luq ttp Other:  No edema   ED Results / Procedures / Treatments   Labs (all labs ordered are listed, but only abnormal results are displayed) Labs Reviewed  BASIC METABOLIC PANEL - Abnormal; Notable for the following components:      Result Value   BUN 24 (*)    All other components within normal limits  CBC - Abnormal; Notable for the following components:   RBC 6.29 (*)    Hemoglobin 18.8 (*)    HCT 54.8 (*)    All other components within normal limits  TROPONIN I (HIGH SENSITIVITY) - Abnormal; Notable for the following  components:   Troponin I (High Sensitivity) 18 (*)    All other components within normal limits  TROPONIN I (HIGH SENSITIVITY) - Abnormal; Notable for the following components:   Troponin I (High Sensitivity) 22 (*)    All other components within normal limits  BRAIN NATRIURETIC PEPTIDE     EKG Interpreted by me Sinus rhythm, rate 87. Left axis, LVH. No ischemic changes   RADIOLOGY Cxr interpreted by me, unremarkable. Radiolgoy report reviewed   PROCEDURES:  Procedures   MEDICATIONS ORDERED IN ED: Medications  pantoprazole (PROTONIX) injection 40 mg (40 mg Intravenous Given 03/14/23 1707)     IMPRESSION / MDM / ASSESSMENT AND PLAN / ED COURSE  I reviewed the triage vital signs and the nursing notes.  DDx: nstemi, gerd, ptx, pulmonary edema  Patient's presentation is most consistent with acute presentation with potential threat to life or bodily function.  Pt p/w atypical chest pain, most likely gerd.  Considering the patient's symptoms, medical history, and physical examination  today, I have low suspicion for ACS, PE, TAD, pneumothorax, carditis, mediastinitis, pneumonia, CHF, or sepsis.    Clinical Course as of 03/15/23 8295  Wynelle Link Mar 14, 2023  1922 Workup negative.  Doubt PE dissection or pericardial effusion.  Suspect GERD/gastritis.  Stable for outpatient follow-up with primary care and cardiology. [PS]    Clinical Course User Index [PS] Sharman Cheek, MD     FINAL CLINICAL IMPRESSION(S) / ED DIAGNOSES   Final diagnoses:  Atypical chest pain     Rx / DC Orders   ED Discharge Orders          Ordered    sucralfate (CARAFATE) 1 g tablet  4 times daily        03/14/23 1921    metoCLOPramide (REGLAN) 10 MG tablet  Every 6 hours PRN        03/14/23 1921    Ambulatory referral to Cardiology       Comments: If you have not heard from the Cardiology office within the next 72 hours please call [phone].   03/14/23 1922             Note:   This document was prepared using Dragon voice recognition software and may include unintentional dictation errors.   Sharman Cheek, MD 03/15/23 [phone]

## 2023-03-14 NOTE — ED Triage Notes (Signed)
Pt reports L sided chest pain and SHOB x1 week.

## 2023-03-15 LAB — TESTOSTERONE,FREE AND TOTAL
Testosterone, Free: 10.5 pg/mL (ref 7.2–24.0)
Testosterone: 337 ng/dL (ref 264–916)

## 2023-03-16 DIAGNOSIS — Z419 Encounter for procedure for purposes other than remedying health state, unspecified: Secondary | ICD-10-CM | POA: Diagnosis not present

## 2023-03-17 ENCOUNTER — Ambulatory Visit (INDEPENDENT_AMBULATORY_CARE_PROVIDER_SITE_OTHER): Payer: Medicaid Other | Admitting: "Endocrinology

## 2023-03-17 ENCOUNTER — Encounter: Payer: Self-pay | Admitting: "Endocrinology

## 2023-03-17 VITALS — BP 138/88 | HR 95 | Resp 20 | Ht 71.0 in | Wt 246.2 lb

## 2023-03-17 DIAGNOSIS — E291 Testicular hypofunction: Secondary | ICD-10-CM

## 2023-03-17 DIAGNOSIS — E038 Other specified hypothyroidism: Secondary | ICD-10-CM | POA: Diagnosis not present

## 2023-03-17 DIAGNOSIS — E6609 Other obesity due to excess calories: Secondary | ICD-10-CM | POA: Diagnosis not present

## 2023-03-17 DIAGNOSIS — Z6834 Body mass index (BMI) 34.0-34.9, adult: Secondary | ICD-10-CM

## 2023-03-17 DIAGNOSIS — E66811 Obesity, class 1: Secondary | ICD-10-CM | POA: Diagnosis not present

## 2023-03-17 NOTE — Progress Notes (Signed)
Outpatient Endocrinology Note Altamese [place], MD    Jake Elliott [facility] [birth year] [id number]  Referring Provider: Berniece Salines, FNP Primary Care Provider: Berniece Salines, FNP Reason for consultation: Subjective   Assessment & Plan  Diagnoses and all orders for this visit:  Hypogonadism in male  Subclinical hypothyroidism  Class 1 obesity due to excess calories with serious comorbidity and body mass index (BMI) of 34.0 to 34.9 in adult    On Testosterone 40.5 MG/2.5GM (1.62%) GEL 2 pumps a day, for 3 mo without adequate testosterone levels Tried testosterone shots in past and felt better with those, prefers shots No 8 am testosterone levels in system Recommend holding off testosterone gel with 8 am blood work, pt could not hold it and self resumed it, however his Hct >54 times 2, hence any further testosterone treatment is deferred  Discussed with patient to follow up with hematologist, pt will get referral from PCP Potential S/E including worsening of: heart disease, lower urinary tracts symptoms, polycythemia and sleep apnea; patient understands and agreed   Recommend follow up to treat sleep apnea as needed, pt has history of sleep apnea, but reports better sleep now and no more choking/gasping for air at night/snoring spells but doesn't use CPAP. Patient is not interested in it.  History of hypothyroidism in past requiring levothyroxine Has not taken it in past 15-10 years TSH at 7, with low normal FT4 Recommend starting levothyroxine po every day: patient tried it without improvement and actually reported feeling worse leading to cutting dose in half Recommend to quit levothyroxine due to lack of improvement and continue TSH follow up with PCP q6-12 mo   No follow-ups on file. Follow with pcp  I have reviewed current medications, nurse's notes, allergies, vital signs, past medical and surgical history, family medical history, and social history for  this encounter. Counseled patient on symptoms, examination findings, lab findings, imaging results, treatment decisions and monitoring and prognosis. The patient understood the recommendations and agrees with the treatment plan. All questions regarding treatment plan were fully answered.  Alt[place]e [place], MD  03/17/23   History of Present Illness HPI   Jake Elliott is a 52 y.o. male with PMH of hypogonadism referred by Dr. Zane Herald for follow up on hypogonadism and subclinical hypothyroidism.   Patient reports being diagnosed of hypogonadism in his 63s and was put on testosterone shots that he took for a year. Had a move and got off of testosterone. He was also put on levothyroxine. Last took 15-20 years ago.  Tiredness lead to diagnosis of low testosterone around 2022.  He has  No h/o mumps in childhood No head or genital trauma No use of OTC supplements, herbal supplements or anabolic steroids. Was in pain management for 8 years in past Yes poor libido No erectile dysfunction; Yes tried PDE5 Inhibitors; Yes improved with PDE5 Inhibitors No early morning spontaneous erection Yes stimulated penile erection Yes loss of energy or vim Yes irritability/low mood No change in sexual hair growth/shaving habit Yes change in muscle tone/mass Yes hot flashes Yes breast enlargement No nipple discharge  No decrease in testicular size Yes weight gain.  No has osteopenia/osteoporosis No history of low trauma fractures  Yes symptoms or history of obstructive sleep apnea, no CPAP No a history of chronic opioid use No BPH previously  Yes history of cardiovascular disease, has hypertrophic obstructive cardiomyopathy  No history of stroke No history of polycythemia Yes LUTS including - frequency, + hesitancy, +  incomplete bladder emptying and - urgency No a previous diagnosis of prostate/testicular cancer   Physical Exam  BP 138/88 (BP Location: Left Arm, Patient Position:  Sitting, Cuff Size: Large)   Pulse 95   Resp 20   Ht 5\' 11"  (1.803 m)   Wt 246 lb 3.2 oz (111.7 kg)   SpO2 97%   BMI 34.34 kg/m    Constitutional: well developed, well nourished Head: normocephalic, atraumatic Eyes: sclera anicteric, no redness Neck: supple Lungs: normal respiratory effort Neurology: alert and oriented Skin: dry, no appreciable rashes Musculoskeletal: no appreciable defects Psychiatric: normal mood and affect   Current Medications Patient's Medications  New Prescriptions   No medications on file  Previous Medications   ALBUTEROL (VENTOLIN HFA) 108 (90 BASE) MCG/ACT INHALER    Inhale 2 puffs into the lungs every 6 (six) hours as needed for wheezing or shortness of breath.   CYCLOBENZAPRINE (FLEXERIL) 10 MG TABLET    Take 1 tablet (10 mg total) by mouth 3 (three) times daily as needed for muscle spasms. This can make you sleepy.   DIPHENHYDRAMINE (BENADRYL) 25 MG TABLET    Take 3 tablets (75 mg total) by mouth at bedtime as needed.   METOCLOPRAMIDE (REGLAN) 10 MG TABLET    Take 1 tablet (10 mg total) by mouth every 6 (six) hours as needed.   METOPROLOL TARTRATE (LOPRESSOR) 25 MG TABLET    Take 12.5 mg by mouth at bedtime.   PANTOPRAZOLE (PROTONIX) 40 MG TABLET    Take 1 tablet (40 mg total) by mouth 2 (two) times daily.   SUCRALFATE (CARAFATE) 1 G TABLET    Take 1 tablet (1 g total) by mouth 4 (four) times daily.   TADALAFIL (CIALIS) 20 MG TABLET    TAKE 1 TABLET BY MOUTH ONCE DAILY AS NEEDED FOR ERECTILE DYSFUNCTION   TESTOSTERONE 40.5 MG/2.5GM (1.62%) GEL    Place 2 Pump onto the skin daily.   TRIAMCINOLONE OINTMENT (KENALOG) 0.5 %    Apply 1 Application topically 2 (two) times daily.   VERAPAMIL (CALAN) 120 MG TABLET    Take 120 mg by mouth 2 (two) times daily.  Modified Medications   No medications on file  Discontinued Medications   LEVOTHYROXINE (SYNTHROID) 100 MCG TABLET    Take 1 tablet (100 mcg total) by mouth daily.    Allergies No Known  Allergies  Past Medical History Past Medical History:  Diagnosis Date   Angina at rest [facility])    Anxiety    Attention-deficit/hyperactivity disorder    Bipolar disorder (HCC)    Depression    GI bleed    History of tobacco abuse    Hypertension    Hypertrophic cardiomyopathy (HCC)    s/p myomectomy   Peripheral neuropathy    PTSD (post-traumatic stress disorder)     Past Surgical History Past Surgical History:  Procedure Laterality Date   CARDIAC CATHETERIZATION     11/14/2010   CORONARY ARTERY BYPASS GRAFT     for myomectomy for hypertrophic cardiomyopathy    Family History family history includes Alcohol abuse in his father and mother; Cancer - Lung in his mother; Drug abuse in his father, mother, and sister; Hypertension in his father; Skin cancer in his mother; Stroke in his father.  Social History Social History   Socioeconomic History   Marital status: Single    Spouse name: Not on file   Number of children: Not on file   Years of education: Not on file  Highest education level: Not on file  Occupational History   Not on file  Tobacco Use   Smoking status: Every Day    Current packs/day: 2.00    Average packs/day: 2.0 packs/day for 34.2 years (68.4 ttl pk-yrs)    Types: Cigarettes    Start date: 12/31/1988   Smokeless tobacco: Never   Tobacco comments:    1 PPD (currently smoking)  Vaping Use   Vaping status: Never Used  Substance and Sexual Activity   Alcohol use: No    Alcohol/week: 0.0 standard drinks of alcohol   Drug use: No   Sexual activity: Yes    Partners: Female    Birth control/protection: None  Other Topics Concern   Not on file  Social History Narrative   Not on file   Social Determinants of Health   Financial Resource Strain: Not on file  Food Insecurity: No Food Insecurity (09/26/2022)   Hunger Vital Sign    Worried About Running Out of Food in the Last Year: Never true    Ran Out of Food in the Last Year: Never true   Transportation Needs: No Transportation Needs (09/26/2022)   PRAPARE - Administrator, Civil Service (Medical): No    Lack of Transportation (Non-Medical): No  Physical Activity: Not on file  Stress: Not on file  Social Connections: Not on file  Intimate Partner Violence: Not At Risk (09/26/2022)   Humiliation, Afraid, Rape, and Kick questionnaire    Fear of Current or Ex-Partner: No    Emotionally Abused: No    Physically Abused: No    Sexually Abused: No    Lab Results  Component Value Date   CHOL 217 (H) 10/30/2022   Lab Results  Component Value Date   HDL 40 (L) 10/30/2022   Lab Results  Component Value Date   LDLCALC 121 (H) 10/30/2022   Lab Results  Component Value Date   TRIG 280 (H) 10/30/2022   Lab Results  Component Value Date   CHOLHDL 5.4 10/30/2022   Lab Results  Component Value Date   CREATININE 1.19 03/14/2023   Lab Results  Component Value Date   GFR 71.50 01/22/2023      Component Value Date/Time   NA 140 03/14/2023 1703   NA 138 07/05/2019 1530   NA 134 (L) 06/14/2013 2021   K 4.5 03/14/2023 1703   K 3.4 (L) 06/14/2013 2021   CL 106 03/14/2023 1703   CL 102 06/14/2013 2021   CO2 24 03/14/2023 1703   CO2 24 06/14/2013 2021   GLUCOSE 89 03/14/2023 1703   GLUCOSE 103 (H) 06/14/2013 2021   BUN 24 (H) 03/14/2023 1703   BUN 21 07/05/2019 1530   BUN 10 06/14/2013 2021   CREATININE 1.19 03/14/2023 1703   CREATININE 0.92 06/14/2013 2021   CALCIUM 9.6 03/14/2023 1703   CALCIUM 8.8 06/14/2013 2021   PROT 6.8 10/30/2022 1434   PROT 6.8 07/05/2019 1530   PROT 7.4 06/14/2013 2021   ALBUMIN 4.2 10/30/2022 1434   ALBUMIN 4.5 07/05/2019 1530   ALBUMIN 3.6 06/14/2013 2021   AST 23 10/30/2022 1434   AST 14 (L) 06/14/2013 2021   ALT 21 10/30/2022 1434   ALT 14 06/14/2013 2021   ALKPHOS 63 10/30/2022 1434   ALKPHOS 91 06/14/2013 2021   BILITOT 0.8 10/30/2022 1434   BILITOT 0.8 07/05/2019 1530   BILITOT 1.2 (H) 06/14/2013 2021    GFRNONAA >60 03/14/2023 1703   GFRNONAA >60 06/14/2013 2021  GFRAA >60 03/09/2020 1551   GFRAA >60 06/14/2013 2021      Latest Ref Rng & Units 03/14/2023    5:03 PM 01/22/2023   10:00 AM 10/30/2022    2:34 PM  BMP  Glucose 70 - 99 mg/dL 89  88  409   BUN 6 - 20 mg/dL 24  20  26    Creatinine 0.61 - 1.24 mg/dL 8.11  9.14  7.82   Sodium 135 - 145 mmol/L 140  137  140   Potassium 3.5 - 5.1 mmol/L 4.5  4.5  4.1   Chloride 98 - 111 mmol/L 106  101  109   CO2 22 - 32 mmol/L 24  27  25    Calcium 8.9 - 10.3 mg/dL 9.6  9.4  9.3        Component Value Date/Time   WBC 7.9 03/14/2023 1703   RBC 6.29 (H) 03/14/2023 1703   HGB 18.8 (H) 03/14/2023 1703   HGB CANCELED 07/05/2019 1530   HCT 54.8 (H) 03/14/2023 1703   HCT CANCELED 07/05/2019 1530   PLT 210 03/14/2023 1703   PLT CANCELED 07/05/2019 1530   MCV 87.1 03/14/2023 1703   MCV 82 06/14/2013 2021   MCH 29.9 03/14/2023 1703   MCHC 34.3 03/14/2023 1703   RDW 12.0 03/14/2023 1703   RDW 13.1 06/14/2013 2021   LYMPHSABS 1.6 03/11/2023 0812   LYMPHSABS CANCELED 07/05/2019 1530   LYMPHSABS 1.1 06/14/2013 2021   MONOABS 0.7 03/11/2023 0812   MONOABS 1.0 06/14/2013 2021   EOSABS 0.3 03/11/2023 0812   EOSABS CANCELED 07/05/2019 1530   EOSABS 0.3 06/14/2013 2021   BASOSABS 0.1 03/11/2023 0812   BASOSABS CANCELED 07/05/2019 1530   BASOSABS 0.1 06/14/2013 2021   Lab Results  Component Value Date   TSH 4.372 03/11/2023   TSH 7.20 (H) 01/22/2023   TSH 2.652 10/30/2022   FREET4 0.95 03/11/2023   FREET4 0.70 01/22/2023         Parts of this note may have been dictated using voice recognition software. There may be variances in spelling and vocabulary which are unintentional. Not all errors are proofread. Please notify the Thereasa Parkin if any discrepancies are noted or if the meaning of any statement is not clear.

## 2023-03-18 DIAGNOSIS — F900 Attention-deficit hyperactivity disorder, predominantly inattentive type: Secondary | ICD-10-CM | POA: Diagnosis not present

## 2023-03-18 DIAGNOSIS — F0634 Mood disorder due to known physiological condition with mixed features: Secondary | ICD-10-CM | POA: Diagnosis not present

## 2023-03-18 DIAGNOSIS — Z599 Problem related to housing and economic circumstances, unspecified: Secondary | ICD-10-CM | POA: Diagnosis not present

## 2023-03-18 DIAGNOSIS — F1111 Opioid abuse, in remission: Secondary | ICD-10-CM | POA: Diagnosis not present

## 2023-03-18 DIAGNOSIS — F4312 Post-traumatic stress disorder, chronic: Secondary | ICD-10-CM | POA: Diagnosis not present

## 2023-03-18 DIAGNOSIS — Z62819 Personal history of unspecified abuse in childhood: Secondary | ICD-10-CM | POA: Diagnosis not present

## 2023-03-18 NOTE — Telephone Encounter (Signed)
Copied from CRM [id number]. Topic: Referral - Request for Referral >> Mar 17, 2023  4:10 PM Franchot Heidelberg wrote:  Has patient seen PCP for this complaint? No. *If NO, is insurance requiring patient see PCP for this issue before PCP can refer them? Referral for which specialty: Hematology  Preferred provider/office: Highest recommended locally in network  Reason for referral: Recommended by endocrinology -red blood cell count is high.

## 2023-03-19 ENCOUNTER — Other Ambulatory Visit: Payer: Self-pay | Admitting: Nurse Practitioner

## 2023-03-19 DIAGNOSIS — D751 Secondary polycythemia: Secondary | ICD-10-CM

## 2023-03-22 ENCOUNTER — Other Ambulatory Visit: Payer: Self-pay | Admitting: Cardiovascular Disease

## 2023-03-22 NOTE — Telephone Encounter (Signed)
Please advise if ok to refill for historical provider.

## 2023-03-23 ENCOUNTER — Telehealth: Payer: Self-pay | Admitting: *Deleted

## 2023-03-23 ENCOUNTER — Inpatient Hospital Stay: Payer: Medicaid Other

## 2023-03-23 ENCOUNTER — Encounter: Payer: Self-pay | Admitting: Student

## 2023-03-23 ENCOUNTER — Encounter: Payer: Self-pay | Admitting: Oncology

## 2023-03-23 ENCOUNTER — Ambulatory Visit: Payer: Medicaid Other | Attending: Student | Admitting: Student

## 2023-03-23 ENCOUNTER — Inpatient Hospital Stay: Payer: Medicaid Other | Attending: Oncology | Admitting: Oncology

## 2023-03-23 VITALS — BP 159/98 | HR 76 | Temp 97.6°F | Resp 16 | Ht 71.0 in | Wt 243.0 lb

## 2023-03-23 VITALS — BP 140/80 | HR 87 | Ht 71.0 in | Wt 245.4 lb

## 2023-03-23 DIAGNOSIS — J449 Chronic obstructive pulmonary disease, unspecified: Secondary | ICD-10-CM

## 2023-03-23 DIAGNOSIS — D751 Secondary polycythemia: Secondary | ICD-10-CM | POA: Insufficient documentation

## 2023-03-23 DIAGNOSIS — R079 Chest pain, unspecified: Secondary | ICD-10-CM | POA: Diagnosis not present

## 2023-03-23 DIAGNOSIS — Z801 Family history of malignant neoplasm of trachea, bronchus and lung: Secondary | ICD-10-CM | POA: Insufficient documentation

## 2023-03-23 DIAGNOSIS — I421 Obstructive hypertrophic cardiomyopathy: Secondary | ICD-10-CM

## 2023-03-23 DIAGNOSIS — F17210 Nicotine dependence, cigarettes, uncomplicated: Secondary | ICD-10-CM | POA: Diagnosis not present

## 2023-03-23 DIAGNOSIS — R072 Precordial pain: Secondary | ICD-10-CM

## 2023-03-23 LAB — CBC (CANCER CENTER ONLY)
HCT: 53.1 % — ABNORMAL HIGH (ref 39.0–52.0)
Hemoglobin: 18.3 g/dL — ABNORMAL HIGH (ref 13.0–17.0)
MCH: 30.1 pg (ref 26.0–34.0)
MCHC: 34.5 g/dL (ref 30.0–36.0)
MCV: 87.5 fL (ref 80.0–100.0)
Platelet Count: 200 10*3/uL (ref 150–400)
RBC: 6.07 MIL/uL — ABNORMAL HIGH (ref 4.22–5.81)
RDW: 12.3 % (ref 11.5–15.5)
WBC Count: 7.6 10*3/uL (ref 4.0–10.5)
nRBC: 0 % (ref 0.0–0.2)

## 2023-03-23 LAB — FERRITIN: Ferritin: 32 ng/mL (ref 24–336)

## 2023-03-23 LAB — IRON AND TIBC
Iron: 93 ug/dL (ref 45–182)
Saturation Ratios: 26 % (ref 17.9–39.5)
TIBC: 353 ug/dL (ref 250–450)
UIBC: 260 ug/dL

## 2023-03-23 MED ORDER — METOPROLOL TARTRATE 100 MG PO TABS: 100.0000 mg | ORAL_TABLET | Freq: Once | ORAL | 0 refills | Status: DC

## 2023-03-23 NOTE — Patient Instructions (Addendum)
Medication Instructions:  No changes at this time.   *If you need a refill on your cardiac medications before your next appointment, please call your pharmacy*   Lab Work: None  If you have labs (blood work) drawn today and your tests are completely normal, you will receive your results only by: MyChart Message (if you have MyChart) OR A paper copy in the mail If you have any lab test that is abnormal or we need to change your treatment, we will call you to review the results.   Testing/Procedures:   Your cardiac CT will be scheduled at one of the below locations:    [facility] [address] [place], [place] [address] [phone]   If scheduled at [facility] or [facility], please arrive 15 mins early for check-in and test prep.  Scheduled to have it done on April 01, 2023 at 1:10 pm. Please arrive 15 minutes early.   Please follow these instructions carefully (unless otherwise directed):  An IV will be required for this test and Nitroglycerin will be given.  Hold all erectile dysfunction medications at least 3 days (72 hrs) prior to test. (Ie viagra, cialis, sildenafil, tadalafil, etc). No dextroamphetamine for 3 days before.     On the Night Before the Test: Be sure to Drink plenty of water. Do not consume any caffeinated/decaffeinated beverages or chocolate 12 hours prior to your test. Do not take any antihistamines 12 hours prior to your test.   On the Day of the Test: Drink plenty of water until 1 hour prior to the test. Do not eat any food 1 hour prior to test. You may take your regular medications prior to the test.  Take metoprolol (Lopressor) 100 mg two hours prior to test. If you take Furosemide/Hydrochlorothiazide/Spironolactone, please HOLD on the morning of the test.        After the Test: Drink plenty of water. After receiving IV contrast, you may  experience a mild flushed feeling. This is normal. On occasion, you may experience a mild rash up to 24 hours after the test. This is not dangerous. If this occurs, you can take Benadryl 25 mg and increase your fluid intake. If you experience trouble breathing, this can be serious. If it is severe call 911 IMMEDIATELY. If it is mild, please call our office. If you take any of these medications: Glipizide/Metformin, Avandament, Glucavance, please do not take 48 hours after completing test unless otherwise instructed.   For more information and frequently asked questions, please visit our website : [email or web address]  For non-scheduling related questions, please contact the cardiac imaging nurse navigator should you have any questions/concerns: Cardiac Imaging Nurse Navigators Direct Office Dial: [phone]   For scheduling needs, including cancellations and rescheduling, please call [place], [phone].    Follow-Up: At [facility], you and your health needs are our priority.  As part of our continuing mission to provide you with exceptional heart care, we have created designated Provider Care Teams.  These Care Teams include your primary Cardiologist (physician) and Advanced Practice Providers (APPs -  Physician Assistants and Nurse Practitioners) who all work together to provide you with the care you need, when you need it.   Your next appointment:   6 week(s)  Provider:   Julien Nordmann, MD or Carlos Levering NP

## 2023-03-23 NOTE — Progress Notes (Signed)
[facility]  Telephone:([phone]) [phone] Fax:(336) [phone]  ID: Jake Elliott OB: September 20, 1970  MR#: [id number]  MWN#:[id number]  Patient Care Team: Berniece Salines, FNP as PCP - General (Nurse Practitioner) Antonieta Iba, MD as PCP - Cardiology (Cardiology) Antonieta Iba, MD as Consulting Physician (Cardiology)  CHIEF COMPLAINT: Secondary polycythemia.  INTERVAL HISTORY: Patient is a 52 year old male who recently initiated testosterone supplement who was noted to have an increasing hemoglobin.  He is referred for further evaluation.  He currently feels well and is asymptomatic.  He has no neurologic complaints.  He denies any recent fevers or illnesses.  He has a good appetite and denies weight loss.  He has no chest pain, shortness of breath, cough, or hemoptysis.  He denies any nausea, vomiting, constipation, or diarrhea.  He has no urinary complaints.  Patient offers no specific complaints today.  REVIEW OF SYSTEMS:   Review of Systems  Constitutional: Negative.  Negative for fever, malaise/fatigue and weight loss.  Respiratory: Negative.  Negative for cough, hemoptysis and shortness of breath.   Cardiovascular: Negative.  Negative for chest pain and leg swelling.  Gastrointestinal:  Negative for abdominal pain.  Genitourinary: Negative.  Negative for dysuria.  Musculoskeletal: Negative.  Negative for back pain.  Skin: Negative.  Negative for rash.  Neurological: Negative.  Negative for dizziness, focal weakness, weakness and headaches.  Psychiatric/Behavioral: Negative.  The patient is not nervous/anxious.     As per HPI. Otherwise, a complete review of systems is negative.  PAST MEDICAL HISTORY: Past Medical History:  Diagnosis Date   Angina at rest [facility])    Anxiety    Attention-deficit/hyperactivity disorder    Bipolar disorder (HCC)    COPD (chronic obstructive pulmonary disease) (HCC)    Depression    GI bleed    History of tobacco abuse     Hypertension    Hypertrophic cardiomyopathy (HCC)    s/p myomectomy   Peripheral neuropathy    PTSD (post-traumatic stress disorder)     PAST SURGICAL HISTORY: Past Surgical History:  Procedure Laterality Date   CARDIAC CATHETERIZATION     11/14/2010   CORONARY ARTERY BYPASS GRAFT     for myomectomy for hypertrophic cardiomyopathy    FAMILY HISTORY: Family History  Problem Relation Age of Onset   Cancer - Lung Mother    Skin cancer Mother    Alcohol abuse Mother    Drug abuse Mother    Stroke Father    Hypertension Father    Alcohol abuse Father    Drug abuse Father    Drug abuse Sister     ADVANCED DIRECTIVES (Y/N):  N  HEALTH MAINTENANCE: Social History   Tobacco Use   Smoking status: Every Day    Current packs/day: 2.00    Average packs/day: 2.0 packs/day for 34.2 years (68.4 ttl pk-yrs)    Types: Cigarettes    Start date: 12/31/1988   Smokeless tobacco: Never   Tobacco comments:    1 PPD (currently smoking)  Vaping Use   Vaping status: Never Used  Substance Use Topics   Alcohol use: No    Alcohol/week: 0.0 standard drinks of alcohol   Drug use: No     Colonoscopy:  PAP:  Bone density:  Lipid panel:  No Known Allergies  Current Outpatient Medications  Medication Sig Dispense Refill   albuterol (VENTOLIN HFA) 108 (90 Base) MCG/ACT inhaler Inhale 2 puffs into the lungs every 6 (six) hours as needed for wheezing or shortness  of breath. 8 g 2   cyclobenzaprine (FLEXERIL) 10 MG tablet Take 1 tablet (10 mg total) by mouth 3 (three) times daily as needed for muscle spasms. This can make you sleepy. 90 tablet 1   dextroamphetamine (DEXTROSTAT) 10 MG tablet Take 10 mg by mouth daily.     diphenhydrAMINE (BENADRYL) 25 MG tablet Take 3 tablets (75 mg total) by mouth at bedtime as needed. (Patient taking differently: Take 12.5 mg by mouth at bedtime as needed.) 90 tablet 1   metoCLOPramide (REGLAN) 10 MG tablet Take 1 tablet (10 mg total) by mouth every 6  (six) hours as needed. 30 tablet 0   metoprolol tartrate (LOPRESSOR) 25 MG tablet Take 12.5 mg by mouth at bedtime.     pantoprazole (PROTONIX) 40 MG tablet Take 1 tablet (40 mg total) by mouth 2 (two) times daily. (Patient taking differently: Take 40 mg by mouth daily.) 180 tablet 1   tadalafil (CIALIS) 20 MG tablet TAKE 1 TABLET BY MOUTH ONCE DAILY AS NEEDED FOR ERECTILE DYSFUNCTION 30 tablet 5   sucralfate (CARAFATE) 1 g tablet Take 1 tablet (1 g total) by mouth 4 (four) times daily. (Patient not taking: Reported on 03/23/2023) 120 tablet 1   triamcinolone ointment (KENALOG) 0.5 % Apply 1 Application topically 2 (two) times daily. (Patient not taking: Reported on 03/23/2023) 30 g 3   verapamil (CALAN) 120 MG tablet Take 1 tablet (120 mg total) by mouth 2 (two) times daily. 180 tablet 3   No current facility-administered medications for this visit.    OBJECTIVE: Vitals:   03/23/23 1123  BP: (!) 159/98  Pulse: 76  Resp: 16  Temp: 97.6 F (36.4 C)  SpO2: 96%     Body mass index is 33.89 kg/m.    ECOG FS:0 - Asymptomatic  General: Well-developed, well-nourished, no acute distress. Eyes: Pink conjunctiva, anicteric sclera. HEENT: Normocephalic, moist mucous membranes. Lungs: No audible wheezing or coughing. Heart: Regular rate and rhythm. Abdomen: Soft, nontender, no obvious distention. Musculoskeletal: No edema, cyanosis, or clubbing. Neuro: Alert, answering all questions appropriately. Cranial nerves grossly intact. Skin: No rashes or petechiae noted. Psych: Normal affect. Lymphatics: No cervical, calvicular, axillary or inguinal LAD.   LAB RESULTS:  Lab Results  Component Value Date   NA 140 03/14/2023   K 4.5 03/14/2023   CL 106 03/14/2023   CO2 24 03/14/2023   GLUCOSE 89 03/14/2023   BUN 24 (H) 03/14/2023   CREATININE 1.19 03/14/2023   CALCIUM 9.6 03/14/2023   PROT 6.8 10/30/2022   ALBUMIN 4.2 10/30/2022   AST 23 10/30/2022   ALT 21 10/30/2022   ALKPHOS 63  10/30/2022   BILITOT 0.8 10/30/2022   GFRNONAA >60 03/14/2023   GFRAA >60 03/09/2020    Lab Results  Component Value Date   WBC 7.6 03/23/2023   NEUTROABS 6.1 03/11/2023   HGB 18.3 (H) 03/23/2023   HCT 53.1 (H) 03/23/2023   MCV 87.5 03/23/2023   PLT 200 03/23/2023     STUDIES: DG Chest 2 View  Result Date: 03/14/2023 CLINICAL DATA:  Left-sided chest pain with shortness of breath for 1 week. EXAM: CHEST - 2 VIEW COMPARISON:  Radiographs 07/06/2022.  CT 10/09/2022. FINDINGS: The heart size and mediastinal contours are stable status post median sternotomy. Chronic emphysematous changes with mild improvement in previously demonstrated diffuse interstitial prominence. No airspace disease, edema, pleural effusion or pneumothorax. There are stable mild degenerative changes in the spine. No acute osseous abnormality. IMPRESSION: No evidence of acute cardiopulmonary  process. Chronic obstructive pulmonary disease. Electronically Signed   By: Carey Bullocks M.D.   On: 03/14/2023 16:57    ASSESSMENT: Secondary polycythemia.  PLAN:    Secondary polycythemia: Likely due to to testosterone use as well as tobacco use.  Patient's hemoglobin is 18.3 today.  Have ordered iron stores, carbon monoxide levels, erythropoietin levels, and JAK2 mutation today for completeness.  Patient will return to clinic in 3 weeks for discussion of his results as well as initiation of phlebotomy.  We also discussed the possibility of having phlebotomy done at [facility] in the future.  I spent a total of 45 minutes reviewing chart data, face-to-face evaluation with the patient, counseling and coordination of care as detailed above.   Patient expressed understanding and was in agreement with this plan. He also understands that He can call clinic at any time with any questions, concerns, or complaints.    Jeralyn Ruths, MD   03/23/2023 12:40 PM

## 2023-03-23 NOTE — Telephone Encounter (Signed)
Spoke with patient and reviewed provider reply. He was understanding with no further questions at this time.
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
## 2023-03-24 LAB — CARBON MONOXIDE, BLOOD (PERFORMED AT REF LAB): Carbon Monoxide, Blood: 6.7 % — ABNORMAL HIGH (ref 0.0–3.6)

## 2023-03-24 LAB — ERYTHROPOIETIN: Erythropoietin: 13.4 m[IU]/mL (ref 2.6–18.5)

## 2023-03-29 ENCOUNTER — Encounter (HOSPITAL_COMMUNITY): Payer: Self-pay

## 2023-03-29 LAB — CALR +MPL + E12-E15  (REFLEX)

## 2023-03-29 LAB — JAK2 V617F RFX CALR/MPL/E12-15

## 2023-03-30 ENCOUNTER — Telehealth: Payer: Self-pay | Admitting: Cardiovascular Disease

## 2023-03-30 ENCOUNTER — Telehealth (HOSPITAL_COMMUNITY): Payer: Self-pay | Admitting: *Deleted

## 2023-03-30 NOTE — Telephone Encounter (Signed)
Spoke w/ pt. He reports that he had to r/s CT that was sched for tomorrow until Monday, as she has to be off of Cialis for at least 3 days He states that he cannot take nitroglycerin due to enlarged heart, but after explanation of the test, he is agreeable to proceeding.  He is appreciative of the call.

## 2023-03-30 NOTE — Telephone Encounter (Signed)
Attempted to call patient regarding upcoming cardiac CT appointment. Unable to leave a message.  Larey Brick RN Navigator Cardiac Imaging [facility] [phone] Office [phone] Cell

## 2023-03-30 NOTE — Telephone Encounter (Signed)
Patient is scheduled to have a CT Cardiac [place] on 10/21. Patient stated that he cannot take nitroglycerin. Would like to talk with Dr. Mariah Milling or nurse about test

## 2023-03-31 ENCOUNTER — Ambulatory Visit: Admission: RE | Admit: 2023-03-31 | Payer: Medicaid Other | Source: Ambulatory Visit

## 2023-04-01 ENCOUNTER — Ambulatory Visit: Admission: RE | Admit: 2023-04-01 | Payer: Medicaid Other | Source: Ambulatory Visit

## 2023-04-01 DIAGNOSIS — M5030 Other cervical disc degeneration, unspecified cervical region: Secondary | ICD-10-CM | POA: Diagnosis not present

## 2023-04-01 DIAGNOSIS — Z79891 Long term (current) use of opiate analgesic: Secondary | ICD-10-CM | POA: Diagnosis not present

## 2023-04-01 DIAGNOSIS — M5432 Sciatica, left side: Secondary | ICD-10-CM | POA: Diagnosis not present

## 2023-04-01 DIAGNOSIS — M5431 Sciatica, right side: Secondary | ICD-10-CM | POA: Diagnosis not present

## 2023-04-01 DIAGNOSIS — G894 Chronic pain syndrome: Secondary | ICD-10-CM | POA: Diagnosis not present

## 2023-04-01 DIAGNOSIS — M5416 Radiculopathy, lumbar region: Secondary | ICD-10-CM | POA: Diagnosis not present

## 2023-04-01 DIAGNOSIS — M47816 Spondylosis without myelopathy or radiculopathy, lumbar region: Secondary | ICD-10-CM | POA: Diagnosis not present

## 2023-04-02 ENCOUNTER — Telehealth (HOSPITAL_COMMUNITY): Payer: Self-pay | Admitting: *Deleted

## 2023-04-02 NOTE — Telephone Encounter (Signed)
Attempted to call patient regarding upcoming cardiac CT appointment. Unable to leave a message.  Jelani Trueba RN Navigator Cardiac Imaging [place] [facility] [phone] Office [phone] Cell  

## 2023-04-02 NOTE — Telephone Encounter (Signed)
Patient returning call upcoming cardiac imaging study; pt verbalizes understanding of appt date/time, parking situation and where to check in, pre-test NPO status and medications ordered, and verified current allergies; name and call back number provided for further questions should they arise  Larey Brick RN Navigator Cardiac Imaging [facility] [phone] office [phone] cell  Patient aware to avoid cialis x 3 days and to take 100mg  metoprolol tartrate two hours prior to his cardiac CT scan.

## 2023-04-05 ENCOUNTER — Ambulatory Visit
Admission: RE | Admit: 2023-04-05 | Discharge: 2023-04-05 | Disposition: A | Discharge status: Home / Self Care | Payer: Medicaid Other | Source: Ambulatory Visit | Attending: Student | Admitting: Student

## 2023-04-05 DIAGNOSIS — R072 Precordial pain: Secondary | ICD-10-CM | POA: Insufficient documentation

## 2023-04-05 MED ORDER — NITROGLYCERIN 0.4 MG SL SUBL
0.8000 mg | SUBLINGUAL_TABLET | Freq: Once | SUBLINGUAL | Status: AC
  Administered 2023-04-05: 0.8 mg via SUBLINGUAL
  Filled 2023-04-05: qty 25

## 2023-04-05 MED ORDER — IOHEXOL 350 MG/ML SOLN
80.0000 mL | Freq: Once | INTRAVENOUS | Status: AC | PRN
  Administered 2023-04-05: 80 mL via INTRAVENOUS

## 2023-04-05 NOTE — Progress Notes (Signed)
Patient tolerated procedure well. Ambulate w/o difficulty. Denies any lightheadedness or being dizzy. Pt denies any pain at this time. Sitting in chair, drinking water provided. P is encouraged to drink additional water throughout the day and reason explained to patient. Patient verbalized understanding and all questions answered. ABC intact. No further needs at this time. Discharge from procedure area w/o issues.  °

## 2023-04-08 ENCOUNTER — Ambulatory Visit: Payer: Medicaid Other | Admitting: Student in an Organized Health Care Education/Training Program

## 2023-04-08 ENCOUNTER — Encounter: Payer: Self-pay | Admitting: Student in an Organized Health Care Education/Training Program

## 2023-04-08 ENCOUNTER — Other Ambulatory Visit
Admission: RE | Admit: 2023-04-08 | Discharge: 2023-04-08 | Disposition: A | Discharge status: Home / Self Care | Payer: Medicaid Other | Source: Ambulatory Visit | Attending: Student in an Organized Health Care Education/Training Program | Admitting: Student in an Organized Health Care Education/Training Program

## 2023-04-08 VITALS — BP 120/80 | HR 77 | Temp 98.2°F | Ht 71.0 in | Wt 242.0 lb

## 2023-04-08 DIAGNOSIS — R053 Chronic cough: Secondary | ICD-10-CM | POA: Diagnosis not present

## 2023-04-08 DIAGNOSIS — R0602 Shortness of breath: Secondary | ICD-10-CM | POA: Diagnosis not present

## 2023-04-08 DIAGNOSIS — I2699 Other pulmonary embolism without acute cor pulmonale: Secondary | ICD-10-CM | POA: Diagnosis not present

## 2023-04-08 DIAGNOSIS — F17200 Nicotine dependence, unspecified, uncomplicated: Secondary | ICD-10-CM

## 2023-04-08 LAB — NITRIC OXIDE: Nitric Oxide: 11

## 2023-04-08 NOTE — Progress Notes (Signed)
Assessment & Plan:   #Shortness of breath #Chronic cough  Presenting for the re-evaluation of cough that appears to have worsened over the past few months. Exam is reassuring without any wheeze, rales, or rhonchi. Reviewed imaging again, patient with notable emphysema in the upper lobes. Differential for the cough includes obstructive lung disease (COPD, chronic bronchitis, asthma) as well as reflux associated cough. Will obtain a pulmonary function test to assess for obstruction, send for alpha-1 to evaluate for emphysema, and get a FENO in clinic. FENO is reassuring at 11 but he did have steroids recently. Also counseled the patient on the importance of smoking cessation. Reflux associated cough also a possibility, did provide the patient with information on how GERD and GERD consistent diet.  - Pulmonary Function Test [facility] Only; Future - Alpha-1-antitrypsin; Future - Nitric oxide normal at 11 ppb today  #Thrombus of pulmonary vein  Patient was incidentally found to have a pulmonary vein thrombus on imaging in the past. No clear consensus guidelines on management of pulmonary vein thrombosus but I did discuss my recommendation to continue anti-coagulation with Jake Elliott in the past. He wanted to come off Eliquis and hasn't restarted it since our last visit.  #Tobacco Use Disorder  Counseled on the importance of smoking cessation.  Follow up scheduled 05/07/2023  I spent 34 minutes caring for this patient today, including preparing to see the patient, obtaining a medical history , reviewing a separately obtained history, performing a medically appropriate examination and/or evaluation, counseling and educating the patient/family/caregiver, ordering medications, tests, or procedures, documenting clinical information in the electronic health record, and independently interpreting results (not separately reported/billed) and communicating results to the patient/family/caregiver. 3 minutes  on smoking cessation counseling.  [name], MD [facility] 04/08/2023 1:16 PM    End of visit medications:  No orders of the defined types were placed in this encounter.    Current Outpatient Medications:    albuterol (VENTOLIN HFA) 108 (90 Base) MCG/ACT inhaler, Inhale 2 puffs into the lungs every 6 (six) hours as needed for wheezing or shortness of breath., Disp: 8 g, Rfl: 2   cyclobenzaprine (FLEXERIL) 10 MG tablet, Take 1 tablet (10 mg total) by mouth 3 (three) times daily as needed for muscle spasms. This can make you sleepy., Disp: 90 tablet, Rfl: 1   dextroamphetamine (DEXTROSTAT) 10 MG tablet, Take 10 mg by mouth daily., Disp: , Rfl:    diphenhydrAMINE (BENADRYL) 25 MG tablet, Take 3 tablets (75 mg total) by mouth at bedtime as needed. (Patient taking differently: Take 12.5 mg by mouth at bedtime as needed.), Disp: 90 tablet, Rfl: 1   metoCLOPramide (REGLAN) 10 MG tablet, Take 1 tablet (10 mg total) by mouth every 6 (six) hours as needed., Disp: 30 tablet, Rfl: 0   metoprolol tartrate (LOPRESSOR) 25 MG tablet, Take 12.5 mg by mouth at bedtime., Disp: , Rfl:    pantoprazole (PROTONIX) 40 MG tablet, Take 1 tablet (40 mg total) by mouth 2 (two) times daily. (Patient taking differently: Take 40 mg by mouth 3 times/day as needed-between meals & bedtime.), Disp: 180 tablet, Rfl: 1   sucralfate (CARAFATE) 1 g tablet, Take 1 tablet (1 g total) by mouth 4 (four) times daily., Disp: 120 tablet, Rfl: 1   tadalafil (CIALIS) 20 MG tablet, TAKE 1 TABLET BY MOUTH ONCE DAILY AS NEEDED FOR ERECTILE DYSFUNCTION, Disp: 30 tablet, Rfl: 5   triamcinolone ointment (KENALOG) 0.5 %, Apply 1 Application topically 2 (two) times  daily., Disp: 30 g, Rfl: 3   verapamil (CALAN) 120 MG tablet, Take 1 tablet (120 mg total) by mouth 2 (two) times daily., Disp: 180 tablet, Rfl: 3   metoprolol tartrate (LOPRESSOR) 100 MG tablet, Take 1 tablet (100 mg total) by mouth once for 1 dose. Take 2  hours prior to your test. (Patient not taking: Reported on 04/08/2023), Disp: 1 tablet, Rfl: 0   Subjective:   PATIENT ID: Jake Elliott GENDER: male DOB: October 06, 1970, MRN: [id number]  Chief Complaint  Patient presents with   Follow-up    Cough, shortness of breath and wheezing. Using ventolin 2-3 x daily.     HPI  Jake Elliott is a 52 year old male presenting to clinic for follow up. I had initially seen him for the the evaluation of cough as well as a pulmonary vein embolus. He is presenting for follow up regarding his cough.  Patient had reported improvement in the cough during his visit in April, but this was not fully resolved. The cough has worsened and continues to be dry. He was prescribed steroids for his back pain recently without much improvement in his cough. He does not have any fevers or chills. He reports an occasional wheeze. They just moved houses and the previous house has contractors working in it, he thinks there might be asbestos in the old house.   Patient was in the ED in January of 2024 secondary to acute cough. He reports symptoms of a URTI in addition to a fever that lead to his visit. During his ED stay, blood work and viral testing were normal/negative, while a CTA of the chest was notable for a small thrombus in the lower branch of the left pulmonary vein. He was discharged on Eliquis and asked to follow up with pulmonology. At the time of our last visit, he requested to be off Eliquis. Patient is not currently taking eliquis or any other anti-coagulant. His last visit to the ED was in September of 2024 for chest pain with reassuring workup. He follows with cardiology.   He does not have any fevers, chills, night sweats, or weight loss. He reports significant back pain.   Patient reports smoking cigarettes (1 pack a day) and has done so for over 15 years. He denies any vaping. He denies any current illicit drug use. 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  He's worked with clay, latex, and wood. No other occupational exposures reported.   CT Chest PE protocol 10/09/2022   Lungs/Pleura: Moderate emphysematous changes are again seen. There are minimal atelectatic changes in both lung bases. There is a 4 mm nodule in the left lower lobe image 9/91 which is unchanged from prior. No pleural effusion or pneumothorax. Trachea and central airways are patent   Cardiovascular: There is adequate opacification of the pulmonary arteries to the segmental level. There is no evidence for pulmonary embolism. Patient is status post cardiac surgery. The heart is mildly enlarged. Aorta is normal in size. There is no pericardial effusion.  Ancillary information including prior medications, full medical/surgical/family/social histories, and PFTs (when available) are listed below and have been reviewed.   Review of Systems  Constitutional:  Negative for chills, fever, malaise/fatigue and weight loss.  Respiratory:  Negative for cough, hemoptysis, sputum production, shortness of breath and wheezing.   Cardiovascular:  Negative for chest pain.  Musculoskeletal:  Positive for back pain.     Objective:   Vitals:   04/08/23 1115  BP:  120/80  Pulse: 77  Temp: 98.2 F (36.8 C)  TempSrc: Temporal  SpO2: 96%  Weight: 242 lb (109.8 kg)  Height: 5\' 11"  (1.803 m)   96% on RA BMI Readings from Last 3 Encounters:  04/08/23 33.75 kg/m  03/23/23 34.23 kg/m  03/23/23 33.89 kg/m   Wt Readings from Last 3 Encounters:  04/08/23 242 lb (109.8 kg)  03/23/23 245 lb 6.4 oz (111.3 kg)  03/23/23 243 lb (110.2 kg)    Physical Exam Constitutional:      Appearance: Normal appearance. He is obese. He is not ill-appearing.  HENT:     Mouth/Throat:     Comments: Would not let me examine  Cardiovascular:     Rate and Rhythm: Normal rate and regular rhythm.     Pulses: Normal pulses.     Heart sounds: Normal heart sounds.  Pulmonary:     Effort: Pulmonary  effort is normal.     Breath sounds: Normal breath sounds.  Abdominal:     Palpations: Abdomen is soft.  Musculoskeletal:        General: Normal range of motion.  Neurological:     General: No focal deficit present.     Mental Status: He is alert.       Ancillary Information    Past Medical History:  Diagnosis Date   Angina at rest [facility])    Anxiety    Attention-deficit/hyperactivity disorder    Bipolar disorder (HCC)    COPD (chronic obstructive pulmonary disease) (HCC)    Depression    GI bleed    History of tobacco abuse    Hypertension    Hypertrophic cardiomyopathy (HCC)    s/p myomectomy   Peripheral neuropathy    PTSD (post-traumatic stress disorder)      Family History  Problem Relation Age of Onset   Cancer - Lung Mother    Skin cancer Mother    Alcohol abuse Mother    Drug abuse Mother    Stroke Father    Hypertension Father    Alcohol abuse Father    Drug abuse Father    Drug abuse Sister      Past Surgical History:  Procedure Laterality Date   CARDIAC CATHETERIZATION     11/14/2010   CORONARY ARTERY BYPASS GRAFT     for myomectomy for hypertrophic cardiomyopathy    Social History   Socioeconomic History   Marital status: Single    Spouse name: Not on file   Number of children: Not on file   Years of education: Not on file   Highest education level: Not on file  Occupational History   Not on file  Tobacco Use   Smoking status: Every Day    Current packs/day: 2.00    Average packs/day: 2.0 packs/day for 34.3 years (68.5 ttl pk-yrs)    Types: Cigarettes    Start date: 12/31/1988   Smokeless tobacco: Never   Tobacco comments:    1 PPD (currently smoking)  Vaping Use   Vaping status: Never Used  Substance and Sexual Activity   Alcohol use: No    Alcohol/week: 0.0 standard drinks of alcohol   Drug use: No   Sexual activity: Yes    Partners: Female    Birth control/protection: None  Other Topics Concern   Not on file  Social  History Narrative   Not on file   Social Determinants of Health   Financial Resource Strain: Not on file  Food Insecurity: Food Insecurity Present (03/23/2023)  Hunger Vital Sign    Worried About Running Out of Food in the Last Year: Sometimes true    Ran Out of Food in the Last Year: Sometimes true  Transportation Needs: No Transportation Needs (03/23/2023)   PRAPARE - Administrator, Civil Service (Medical): No    Lack of Transportation (Non-Medical): No  Physical Activity: Not on file  Stress: Not on file  Social Connections: Not on file  Intimate Partner Violence: Not At Risk (03/23/2023)   Humiliation, Afraid, Rape, and Kick questionnaire    Fear of Current or Ex-Partner: No    Emotionally Abused: No    Physically Abused: No    Sexually Abused: No     No Known Allergies   CBC    Component Value Date/Time   WBC 7.6 03/23/2023 1205   WBC 7.9 03/14/2023 1703   RBC 6.07 (H) 03/23/2023 1205   HGB 18.3 (H) 03/23/2023 1205   HGB CANCELED 07/05/2019 1530   HCT 53.1 (H) 03/23/2023 1205   HCT CANCELED 07/05/2019 1530   PLT 200 03/23/2023 1205   PLT CANCELED 07/05/2019 1530   MCV 87.5 03/23/2023 1205   MCV 82 06/14/2013 2021   MCH 30.1 03/23/2023 1205   MCHC 34.5 03/23/2023 1205   RDW 12.3 03/23/2023 1205   RDW 13.1 06/14/2013 2021   LYMPHSABS 1.6 03/11/2023 0812   LYMPHSABS CANCELED 07/05/2019 1530   LYMPHSABS 1.1 06/14/2013 2021   MONOABS 0.7 03/11/2023 0812   MONOABS 1.0 06/14/2013 2021   EOSABS 0.3 03/11/2023 0812   EOSABS CANCELED 07/05/2019 1530   EOSABS 0.3 06/14/2013 2021   BASOSABS 0.1 03/11/2023 0812   BASOSABS CANCELED 07/05/2019 1530   BASOSABS 0.1 06/14/2013 2021    Pulmonary Functions Testing Results:     No data to display          Outpatient Medications Prior to Visit  Medication Sig Dispense Refill   albuterol (VENTOLIN HFA) 108 (90 Base) MCG/ACT inhaler Inhale 2 puffs into the lungs every 6 (six) hours as needed for wheezing or  shortness of breath. 8 g 2   cyclobenzaprine (FLEXERIL) 10 MG tablet Take 1 tablet (10 mg total) by mouth 3 (three) times daily as needed for muscle spasms. This can make you sleepy. 90 tablet 1   dextroamphetamine (DEXTROSTAT) 10 MG tablet Take 10 mg by mouth daily.     diphenhydrAMINE (BENADRYL) 25 MG tablet Take 3 tablets (75 mg total) by mouth at bedtime as needed. (Patient taking differently: Take 12.5 mg by mouth at bedtime as needed.) 90 tablet 1   metoCLOPramide (REGLAN) 10 MG tablet Take 1 tablet (10 mg total) by mouth every 6 (six) hours as needed. 30 tablet 0   metoprolol tartrate (LOPRESSOR) 25 MG tablet Take 12.5 mg by mouth at bedtime.     pantoprazole (PROTONIX) 40 MG tablet Take 1 tablet (40 mg total) by mouth 2 (two) times daily. (Patient taking differently: Take 40 mg by mouth 3 times/day as needed-between meals & bedtime.) 180 tablet 1   sucralfate (CARAFATE) 1 g tablet Take 1 tablet (1 g total) by mouth 4 (four) times daily. 120 tablet 1   tadalafil (CIALIS) 20 MG tablet TAKE 1 TABLET BY MOUTH ONCE DAILY AS NEEDED FOR ERECTILE DYSFUNCTION 30 tablet 5   triamcinolone ointment (KENALOG) 0.5 % Apply 1 Application topically 2 (two) times daily. 30 g 3   verapamil (CALAN) 120 MG tablet Take 1 tablet (120 mg total) by mouth 2 (two) times daily.  180 tablet 3   metoprolol tartrate (LOPRESSOR) 100 MG tablet Take 1 tablet (100 mg total) by mouth once for 1 dose. Take 2 hours prior to your test. (Patient not taking: Reported on 04/08/2023) 1 tablet 0   No facility-administered medications prior to visit.

## 2023-04-10 LAB — ALPHA-1-ANTITRYPSIN: A-1 Antitrypsin, Ser: 142 mg/dL (ref 101–187)

## 2023-04-13 ENCOUNTER — Encounter: Payer: Self-pay | Admitting: Oncology

## 2023-04-13 ENCOUNTER — Inpatient Hospital Stay: Payer: Medicaid Other

## 2023-04-13 ENCOUNTER — Inpatient Hospital Stay (HOSPITAL_BASED_OUTPATIENT_CLINIC_OR_DEPARTMENT_OTHER): Payer: Medicaid Other | Admitting: Oncology

## 2023-04-13 VITALS — BP 145/82 | HR 68 | Temp 98.8°F | Resp 18 | Ht 71.0 in | Wt 242.0 lb

## 2023-04-13 DIAGNOSIS — D751 Secondary polycythemia: Secondary | ICD-10-CM

## 2023-04-13 DIAGNOSIS — Z62819 Personal history of unspecified abuse in childhood: Secondary | ICD-10-CM | POA: Diagnosis not present

## 2023-04-13 DIAGNOSIS — Z599 Problem related to housing and economic circumstances, unspecified: Secondary | ICD-10-CM | POA: Diagnosis not present

## 2023-04-13 DIAGNOSIS — F4312 Post-traumatic stress disorder, chronic: Secondary | ICD-10-CM | POA: Diagnosis not present

## 2023-04-13 DIAGNOSIS — F1111 Opioid abuse, in remission: Secondary | ICD-10-CM | POA: Diagnosis not present

## 2023-04-13 DIAGNOSIS — F0634 Mood disorder due to known physiological condition with mixed features: Secondary | ICD-10-CM | POA: Diagnosis not present

## 2023-04-13 DIAGNOSIS — F900 Attention-deficit hyperactivity disorder, predominantly inattentive type: Secondary | ICD-10-CM | POA: Diagnosis not present

## 2023-04-13 NOTE — Progress Notes (Signed)
[facility]  [phone] [phone]  ID: Jake Elliott OB: 06-14-1971  MR#: [id number]  XBM#:[id number]  Patient Care Team: Berniece Salines, FNP as PCP - General (Nurse Practitioner) Antonieta Iba, MD as PCP - Cardiology (Cardiology) Antonieta Iba, MD as Consulting Physician (Cardiology)  CHIEF COMPLAINT: Secondary polycythemia.  INTERVAL HISTORY: Patient returns to clinic today for further evaluation, discussion of his laboratory work, and consideration of phlebotomy.  He currently feels well and is asymptomatic.  He has no neurologic complaints.  He denies any recent fevers or illnesses.  He has a good appetite and denies weight loss.  He has no chest pain, shortness of breath, cough, or hemoptysis.  He denies any nausea, vomiting, constipation, or diarrhea.  He has no urinary complaints.  Patient offers no specific complaints today.    REVIEW OF SYSTEMS:   Review of Systems  Constitutional: Negative.  Negative for fever, malaise/fatigue and weight loss.  Respiratory: Negative.  Negative for cough, hemoptysis and shortness of breath.   Cardiovascular: Negative.  Negative for chest pain and leg swelling.  Gastrointestinal:  Negative for abdominal pain.  Genitourinary: Negative.  Negative for dysuria.  Musculoskeletal: Negative.  Negative for back pain.  Skin: Negative.  Negative for rash.  Neurological: Negative.  Negative for dizziness, focal weakness, weakness and headaches.  Psychiatric/Behavioral: Negative.  The patient is not nervous/anxious.     As per HPI. Otherwise, a complete review of systems is negative.  PAST MEDICAL HISTORY: Past Medical History:  Diagnosis Date   Angina at rest [facility])    Anxiety    Attention-deficit/hyperactivity disorder    Bipolar disorder (HCC)    COPD (chronic obstructive pulmonary disease) (HCC)    Depression    GI bleed    History of tobacco abuse    Hypertension    Hypertrophic  cardiomyopathy (HCC)    s/p myomectomy   Peripheral neuropathy    PTSD (post-traumatic stress disorder)     PAST SURGICAL HISTORY: Past Surgical History:  Procedure Laterality Date   CARDIAC CATHETERIZATION     11/14/2010   CORONARY ARTERY BYPASS GRAFT     for myomectomy for hypertrophic cardiomyopathy    FAMILY HISTORY: Family History  Problem Relation Age of Onset   Cancer - Lung Mother    Skin cancer Mother    Alcohol abuse Mother    Drug abuse Mother    Stroke Father    Hypertension Father    Alcohol abuse Father    Drug abuse Father    Drug abuse Sister     ADVANCED DIRECTIVES (Y/N):  N  HEALTH MAINTENANCE: Social History   Tobacco Use   Smoking status: Every Day    Current packs/day: 2.00    Average packs/day: 2.0 packs/day for 34.3 years (68.6 ttl pk-yrs)    Types: Cigarettes    Start date: 12/31/1988   Smokeless tobacco: Never   Tobacco comments:    1 PPD (currently smoking)  Vaping Use   Vaping status: Never Used  Substance Use Topics   Alcohol use: No    Alcohol/week: 0.0 standard drinks of alcohol   Drug use: No     Colonoscopy:  PAP:  Bone density:  Lipid panel:  No Known Allergies  Current Outpatient Medications  Medication Sig Dispense Refill   albuterol (VENTOLIN HFA) 108 (90 Base) MCG/ACT inhaler Inhale 2 puffs into the lungs every 6 (six) hours as needed for wheezing or shortness of breath. 8 g 2  cyclobenzaprine (FLEXERIL) 10 MG tablet Take 1 tablet (10 mg total) by mouth 3 (three) times daily as needed for muscle spasms. This can make you sleepy. 90 tablet 1   dextroamphetamine (DEXTROSTAT) 10 MG tablet Take 10 mg by mouth daily.     diphenhydrAMINE (BENADRYL) 25 MG tablet Take 3 tablets (75 mg total) by mouth at bedtime as needed. (Patient taking differently: Take 12.5 mg by mouth at bedtime as needed.) 90 tablet 1   metoCLOPramide (REGLAN) 10 MG tablet Take 1 tablet (10 mg total) by mouth every 6 (six) hours as needed. 30 tablet 0    metoprolol tartrate (LOPRESSOR) 25 MG tablet Take 12.5 mg by mouth at bedtime.     Oxycodone HCl 10 MG TABS Take by mouth.     pantoprazole (PROTONIX) 40 MG tablet Take 1 tablet (40 mg total) by mouth 2 (two) times daily. (Patient taking differently: Take 40 mg by mouth 3 times/day as needed-between meals & bedtime.) 180 tablet 1   sucralfate (CARAFATE) 1 g tablet Take 1 tablet (1 g total) by mouth 4 (four) times daily. 120 tablet 1   tadalafil (CIALIS) 20 MG tablet TAKE 1 TABLET BY MOUTH ONCE DAILY AS NEEDED FOR ERECTILE DYSFUNCTION 30 tablet 5   triamcinolone ointment (KENALOG) 0.5 % Apply 1 Application topically 2 (two) times daily. 30 g 3   verapamil (CALAN) 120 MG tablet Take 1 tablet (120 mg total) by mouth 2 (two) times daily. 180 tablet 3   metoprolol tartrate (LOPRESSOR) 100 MG tablet Take 1 tablet (100 mg total) by mouth once for 1 dose. Take 2 hours prior to your test. (Patient not taking: Reported on 04/08/2023) 1 tablet 0   No current facility-administered medications for this visit.    OBJECTIVE: Vitals:   04/13/23 1444  BP: (!) 145/82  Pulse: 68  Resp: 18  Temp: 98.8 F (37.1 C)  SpO2: 97%     Body mass index is 33.75 kg/m.    ECOG FS:0 - Asymptomatic  General: Well-developed, well-nourished, no acute distress. Eyes: Pink conjunctiva, anicteric sclera. HEENT: Normocephalic, moist mucous membranes. Lungs: No audible wheezing or coughing. Heart: Regular rate and rhythm. Abdomen: Soft, nontender, no obvious distention. Musculoskeletal: No edema, cyanosis, or clubbing. Neuro: Alert, answering all questions appropriately. Cranial nerves grossly intact. Skin: No rashes or petechiae noted. Psych: Normal affect.  LAB RESULTS:  Lab Results  Component Value Date   NA 140 03/14/2023   K 4.5 03/14/2023   CL 106 03/14/2023   CO2 24 03/14/2023   GLUCOSE 89 03/14/2023   BUN 24 (H) 03/14/2023   CREATININE 1.19 03/14/2023   CALCIUM 9.6 03/14/2023   PROT 6.8 10/30/2022    ALBUMIN 4.2 10/30/2022   AST 23 10/30/2022   ALT 21 10/30/2022   ALKPHOS 63 10/30/2022   BILITOT 0.8 10/30/2022   GFRNONAA >60 03/14/2023   GFRAA >60 03/09/2020    Lab Results  Component Value Date   WBC 7.6 03/23/2023   NEUTROABS 6.1 03/11/2023   HGB 18.3 (H) 03/23/2023   HCT 53.1 (H) 03/23/2023   MCV 87.5 03/23/2023   PLT 200 03/23/2023     STUDIES: CT CORONARY MORPH W/CTA COR W/SCORE W/CA W/CM &/OR WO/CM  Result Date: 04/05/2023 CLINICAL DATA:  Chest pain EXAM: Cardiac/Coronary  CTA TECHNIQUE: The patient was scanned on a [employer] scanner. : A retrospective scan was triggered in the ascending thoracic aorta. Axial non-contrast 3 mm slices were carried out through the heart. The data set  was analyzed on a dedicated work station and scored using the [employer] . Gantry rotation speed was 330 msecs and collimation was .6 mm. 100mg  of metoprolol and 0.8 mg of sl NTG was given. The 3D data set was reconstructed in 5% intervals of the 60-95 % of the R-R cycle. Diastolic phases were analyzed on a dedicated work station using MPR, MIP and VRT modes. The patient received 80 cc of contrast. FINDINGS: Aorta:  Normal size.  No calcifications.  No dissection. Aortic Valve:  Trileaflet.  No calcifications. Coronary Arteries:  Normal coronary origin.  Right dominance. RCA is a dominant artery. There is minimal calcified plaque proximally causing minimal stenosis (<25%). Left main gives rise to LAD and LCX arteries. LM has no disease. LAD has calcified plaque proximally causing mild stenosis (25%). LCX is a non-dominant artery.  There is no plaque. Other findings: Normal pulmonary vein drainage into the left atrium. Normal left atrial appendage without a thrombus. Normal size of the pulmonary artery. IMPRESSION: 1. Coronary calcium score of 124. This was 90th percentile for age and sex matched control. 2. Normal coronary origin with right dominance. 3. Mild proximal LAD stenosis (25%). 4.  Minimal proximal RCA stenosis (<25%). 5. CAD-RADS 2. Mild non-obstructive CAD (25-49%). Consider non-atherosclerotic causes of chest pain. Consider preventive therapy and risk factor modification. Electronically Signed   By: Debbe Odea M.D.   On: 04/05/2023 16:00   DG Chest 2 View  Result Date: 03/14/2023 CLINICAL DATA:  Left-sided chest pain with shortness of breath for 1 week. EXAM: CHEST - 2 VIEW COMPARISON:  Radiographs 07/06/2022.  CT 10/09/2022. FINDINGS: The heart size and mediastinal contours are stable status post median sternotomy. Chronic emphysematous changes with mild improvement in previously demonstrated diffuse interstitial prominence. No airspace disease, edema, pleural effusion or pneumothorax. There are stable mild degenerative changes in the spine. No acute osseous abnormality. IMPRESSION: No evidence of acute cardiopulmonary process. Chronic obstructive pulmonary disease. Electronically Signed   By: Carey Bullocks M.D.   On: 03/14/2023 16:57    ASSESSMENT: Secondary polycythemia.  PLAN:    Secondary polycythemia: Likely due to to testosterone use as well as tobacco use.  Patient's most recent hemoglobin is 18.3.  He has an elevated carbon monoxide level, but the remainder of his laboratory work including iron stores, erythropoietin levels, and JAK2 mutation with reflex are either negative or within normal limits.  Patient does not wish to do phlebotomy today and states he will go to [facility] for donation.  He has been instructed to donate 2-3 times over the next 6 months.  Return to clinic in 6 months with repeat laboratory work and further evaluation. Hypertension: Patient's blood pressure is moderately elevated today.  Continue monitoring and treatment per primary care.  I spent a total of 20 minutes reviewing chart data, face-to-face evaluation with the patient, counseling and coordination of care as detailed above.    Patient expressed understanding and was in  agreement with this plan. He also understands that He can call clinic at any time with any questions, concerns, or complaints.    Jeralyn Ruths, MD   04/13/2023 3:02 PM

## 2023-04-15 ENCOUNTER — Encounter: Payer: Self-pay | Admitting: Oncology

## 2023-04-16 DIAGNOSIS — Z419 Encounter for procedure for purposes other than remedying health state, unspecified: Secondary | ICD-10-CM | POA: Diagnosis not present

## 2023-04-26 ENCOUNTER — Other Ambulatory Visit: Payer: Self-pay | Admitting: Nurse Practitioner

## 2023-04-26 DIAGNOSIS — G8929 Other chronic pain: Secondary | ICD-10-CM

## 2023-04-26 DIAGNOSIS — M5412 Radiculopathy, cervical region: Secondary | ICD-10-CM

## 2023-04-26 DIAGNOSIS — M5416 Radiculopathy, lumbar region: Secondary | ICD-10-CM

## 2023-04-26 DIAGNOSIS — M5030 Other cervical disc degeneration, unspecified cervical region: Secondary | ICD-10-CM

## 2023-04-26 DIAGNOSIS — M47812 Spondylosis without myelopathy or radiculopathy, cervical region: Secondary | ICD-10-CM

## 2023-04-26 DIAGNOSIS — M47816 Spondylosis without myelopathy or radiculopathy, lumbar region: Secondary | ICD-10-CM

## 2023-04-27 ENCOUNTER — Other Ambulatory Visit: Payer: Self-pay

## 2023-04-27 DIAGNOSIS — F900 Attention-deficit hyperactivity disorder, predominantly inattentive type: Secondary | ICD-10-CM | POA: Diagnosis not present

## 2023-04-27 DIAGNOSIS — M47812 Spondylosis without myelopathy or radiculopathy, cervical region: Secondary | ICD-10-CM

## 2023-04-27 DIAGNOSIS — Z599 Problem related to housing and economic circumstances, unspecified: Secondary | ICD-10-CM | POA: Diagnosis not present

## 2023-04-27 DIAGNOSIS — F4312 Post-traumatic stress disorder, chronic: Secondary | ICD-10-CM | POA: Diagnosis not present

## 2023-04-27 DIAGNOSIS — M5416 Radiculopathy, lumbar region: Secondary | ICD-10-CM

## 2023-04-27 DIAGNOSIS — Z62819 Personal history of unspecified abuse in childhood: Secondary | ICD-10-CM | POA: Diagnosis not present

## 2023-04-27 DIAGNOSIS — M5030 Other cervical disc degeneration, unspecified cervical region: Secondary | ICD-10-CM

## 2023-04-27 DIAGNOSIS — F1111 Opioid abuse, in remission: Secondary | ICD-10-CM | POA: Diagnosis not present

## 2023-04-27 DIAGNOSIS — F0634 Mood disorder due to known physiological condition with mixed features: Secondary | ICD-10-CM | POA: Diagnosis not present

## 2023-04-27 DIAGNOSIS — M47816 Spondylosis without myelopathy or radiculopathy, lumbar region: Secondary | ICD-10-CM

## 2023-04-27 DIAGNOSIS — M5412 Radiculopathy, cervical region: Secondary | ICD-10-CM

## 2023-04-27 DIAGNOSIS — G8929 Other chronic pain: Secondary | ICD-10-CM

## 2023-04-27 NOTE — Telephone Encounter (Signed)
Requested medication (s) are due for refill today - yes  Requested medication (s) are on the active medication list -yes  Future visit scheduled -yes  Last refill: 03/01/23 #90 1RF  Notes to clinic: non delegated Rx  Requested Prescriptions  Pending Prescriptions Disp Refills   cyclobenzaprine (FLEXERIL) 10 MG tablet [Pharmacy Med Name: Cyclobenzaprine HCl 10 MG Oral Tablet] 90 tablet 0    Sig: TAKE 1 TABLET BY MOUTH THREE TIMES DAILY AS NEEDED FOR MUSCLE SPASM THIS  CAN  MAKE  YOU  SLEEPY     Not Delegated - Analgesics:  Muscle Relaxants Failed - 04/26/2023  4:34 PM      Failed - This refill cannot be delegated      Passed - Valid encounter within last 6 months    Recent Outpatient Visits           3 months ago Lumbar spondylosis   [facility] Berniece Salines, FNP   4 months ago Bipolar affective disorder, currently depressed, mild [facility])   [facility] Berniece Salines, FNP   5 months ago Testosterone deficiency   [facility] Berniece Salines, FNP   6 months ago Lumbar radiculopathy   [facility] Berniece Salines, FNP       Future Appointments             In 1 week [name], MD [facility] [place]   In 3 weeks Carlos Levering, NP [facility]               Requested Prescriptions  Pending Prescriptions Disp Refills   cyclobenzaprine (FLEXERIL) 10 MG tablet [Pharmacy Med Name: Cyclobenzaprine HCl 10 MG Oral Tablet] 90 tablet 0    Sig: TAKE 1 TABLET BY MOUTH THREE TIMES DAILY AS NEEDED FOR MUSCLE SPASM THIS  CAN  MAKE  YOU  SLEEPY     Not Delegated - Analgesics:  Muscle Relaxants Failed - 04/26/2023  4:34 PM      Failed - This refill cannot be delegated      Passed - Valid encounter within last 6 months    Recent Outpatient Visits           3 months ago Lumbar spondylosis   [facility] Berniece Salines, FNP   4 months ago Bipolar affective disorder, currently depressed, mild [facility])   [facility] Berniece Salines, FNP   5 months ago Testosterone deficiency   [facility] Berniece Salines, FNP   6 months ago Lumbar radiculopathy   [facility] Berniece Salines, FNP       Future Appointments             In 1 week [name], MD [facility] [place]   In 3 weeks Carlos Levering, NP [facility]

## 2023-04-28 MED ORDER — CYCLOBENZAPRINE HCL 10 MG PO TABS: 10.0000 mg | ORAL_TABLET | Freq: Three times a day (TID) | ORAL | 1 refills | Status: DC | PRN

## 2023-05-06 ENCOUNTER — Ambulatory Visit: Payer: Medicaid Other | Attending: Student in an Organized Health Care Education/Training Program

## 2023-05-06 DIAGNOSIS — R053 Chronic cough: Secondary | ICD-10-CM | POA: Diagnosis not present

## 2023-05-06 DIAGNOSIS — R0602 Shortness of breath: Secondary | ICD-10-CM | POA: Diagnosis not present

## 2023-05-06 DIAGNOSIS — F17210 Nicotine dependence, cigarettes, uncomplicated: Secondary | ICD-10-CM | POA: Diagnosis not present

## 2023-05-06 DIAGNOSIS — R0609 Other forms of dyspnea: Secondary | ICD-10-CM | POA: Insufficient documentation

## 2023-05-06 LAB — PULMONARY FUNCTION TEST ARMC ONLY
DL/VA % pred: 96 %
DL/VA: 4.25 ml/min/mmHg/L
DLCO unc % pred: 73 %
DLCO unc: 22.12 ml/min/mmHg
FEF 25-75 Post: 2.88 L/s
FEF 25-75 Pre: 2.33 L/s
FEF2575-%Change-Post: 23 %
FEF2575-%Pred-Post: 80 %
FEF2575-%Pred-Pre: 65 %
FEV1-%Change-Post: 12 %
FEV1-%Pred-Post: 76 %
FEV1-%Pred-Pre: 68 %
FEV1-Post: 3.12 L
FEV1-Pre: 2.78 L
FEV1FVC-%Change-Post: 12 %
FEV1FVC-%Pred-Pre: 87 %
FEV6-%Change-Post: 0 %
FEV6-%Pred-Post: 79 %
FEV6-%Pred-Pre: 79 %
FEV6-Post: 3.99 L
FEV6-Pre: 4 L
FEV6FVC-%Change-Post: 0 %
FEV6FVC-%Pred-Post: 103 %
FEV6FVC-%Pred-Pre: 103 %
FVC-%Change-Post: 0 %
FVC-%Pred-Post: 77 %
FVC-%Pred-Pre: 78 %
FVC-Post: 4.07 L
FVC-Pre: 4.09 L
Post FEV1/FVC ratio: 77 %
Post FEV6/FVC ratio: 100 %
Pre FEV1/FVC ratio: 68 %
Pre FEV6/FVC Ratio: 100 %
RV % pred: 99 %
RV: 2.07 L
TLC % pred: 85 %
TLC: 6.13 L

## 2023-05-06 MED ORDER — ALBUTEROL SULFATE (2.5 MG/3ML) 0.083% IN NEBU
2.5000 mg | INHALATION_SOLUTION | Freq: Once | RESPIRATORY_TRACT | Status: AC
  Administered 2023-05-06: 2.5 mg via RESPIRATORY_TRACT
  Filled 2023-05-06: qty 3

## 2023-05-07 ENCOUNTER — Encounter: Payer: Self-pay | Admitting: Student in an Organized Health Care Education/Training Program

## 2023-05-07 ENCOUNTER — Telehealth: Payer: Self-pay | Admitting: Student in an Organized Health Care Education/Training Program

## 2023-05-07 ENCOUNTER — Ambulatory Visit (INDEPENDENT_AMBULATORY_CARE_PROVIDER_SITE_OTHER): Payer: Medicaid Other | Admitting: Student in an Organized Health Care Education/Training Program

## 2023-05-07 VITALS — BP 140/78 | HR 75 | Temp 98.1°F | Ht 71.0 in | Wt 250.4 lb

## 2023-05-07 DIAGNOSIS — J4540 Moderate persistent asthma, uncomplicated: Secondary | ICD-10-CM

## 2023-05-07 DIAGNOSIS — R0602 Shortness of breath: Secondary | ICD-10-CM

## 2023-05-07 DIAGNOSIS — R053 Chronic cough: Secondary | ICD-10-CM

## 2023-05-07 DIAGNOSIS — I2699 Other pulmonary embolism without acute cor pulmonale: Secondary | ICD-10-CM

## 2023-05-07 DIAGNOSIS — F17200 Nicotine dependence, unspecified, uncomplicated: Secondary | ICD-10-CM

## 2023-05-07 MED ORDER — AEROCHAMBER MV MISC
0 refills | Status: AC
Start: 2023-05-07 — End: ?

## 2023-05-07 MED ORDER — NICOTINE POLACRILEX 2 MG MT LOZG: 2.0000 mg | LOZENGE | OROMUCOSAL | 3 refills | Status: AC | PRN

## 2023-05-07 MED ORDER — BREZTRI AEROSPHERE 160-9-4.8 MCG/ACT IN AERO: 2.0000 | INHALATION_SPRAY | Freq: Two times a day (BID) | RESPIRATORY_TRACT | 6 refills | Status: DC

## 2023-05-07 NOTE — Progress Notes (Unsigned)
Synopsis: Referred in *** by Berniece Salines, FNP  Assessment & Plan:   1. Chronic cough *** - Budeson-Glycopyrrol-Formoterol (BREZTRI AEROSPHERE) 160-9-4.8 MCG/ACT AERO; Inhale 2 puffs into the lungs in the morning and at bedtime.  Dispense: 4 each; Refill: 6 - Spacer/Aero-Holding Chambers (AEROCHAMBER MV) inhaler; Use as instructed  Dispense: 1 each; Refill: 0  2. Moderate persistent asthma without complication *** - Budeson-Glycopyrrol-Formoterol (BREZTRI AEROSPHERE) 160-9-4.8 MCG/ACT AERO; Inhale 2 puffs into the lungs in the morning and at bedtime.  Dispense: 4 each; Refill: 6 - Spacer/Aero-Holding Chambers (AEROCHAMBER MV) inhaler; Use as instructed  Dispense: 1 each; Refill: 0  3. Tobacco use disorder *** - nicotine polacrilex (NICOTINE MINI) 2 MG lozenge; Take 1 lozenge (2 mg total) by mouth every 2 (two) hours as needed for smoking cessation.  Dispense: 72 lozenge; Refill: 3  Asthma/copd, send breztri with spacer  Return in about 2 months (around 07/07/2023).  I spent *** minutes caring for this patient today, including {EM billing:[id number]}  [name], MD [place] 05/07/2023 11:34 AM    End of visit medications:  Meds ordered this encounter  Medications   Budeson-Glycopyrrol-Formoterol (BREZTRI AEROSPHERE) 160-9-4.8 MCG/ACT AERO    Sig: Inhale 2 puffs into the lungs in the morning and at bedtime.    Dispense:  4 each    Refill:  6   Spacer/Aero-Holding Chambers (AEROCHAMBER MV) inhaler    Sig: Use as instructed    Dispense:  1 each    Refill:  0   nicotine polacrilex (NICOTINE MINI) 2 MG lozenge    Sig: Take 1 lozenge (2 mg total) by mouth every 2 (two) hours as needed for smoking cessation.    Dispense:  72 lozenge    Refill:  3     Current Outpatient Medications:    Budeson-Glycopyrrol-Formoterol (BREZTRI AEROSPHERE) 160-9-4.8 MCG/ACT AERO, Inhale 2 puffs into the lungs in the morning and at bedtime., Disp: 4 each, Rfl: 6    nicotine polacrilex (NICOTINE MINI) 2 MG lozenge, Take 1 lozenge (2 mg total) by mouth every 2 (two) hours as needed for smoking cessation., Disp: 72 lozenge, Rfl: 3   Spacer/Aero-Holding Chambers (AEROCHAMBER MV) inhaler, Use as instructed, Disp: 1 each, Rfl: 0   albuterol (VENTOLIN HFA) 108 (90 Base) MCG/ACT inhaler, Inhale 2 puffs into the lungs every 6 (six) hours as needed for wheezing or shortness of breath., Disp: 8 g, Rfl: 2   cyclobenzaprine (FLEXERIL) 10 MG tablet, Take 1 tablet (10 mg total) by mouth 3 (three) times daily as needed for muscle spasms. This can make you sleepy., Disp: 90 tablet, Rfl: 1   dextroamphetamine (DEXTROSTAT) 10 MG tablet, Take 10 mg by mouth daily., Disp: , Rfl:    diphenhydrAMINE (BENADRYL) 25 MG tablet, Take 3 tablets (75 mg total) by mouth at bedtime as needed. (Patient taking differently: Take 12.5 mg by mouth at bedtime as needed.), Disp: 90 tablet, Rfl: 1   metoCLOPramide (REGLAN) 10 MG tablet, Take 1 tablet (10 mg total) by mouth every 6 (six) hours as needed., Disp: 30 tablet, Rfl: 0   metoprolol tartrate (LOPRESSOR) 100 MG tablet, Take 1 tablet (100 mg total) by mouth once for 1 dose. Take 2 hours prior to your test. (Patient not taking: Reported on 04/08/2023), Disp: 1 tablet, Rfl: 0   metoprolol tartrate (LOPRESSOR) 25 MG tablet, Take 12.5 mg by mouth at bedtime., Disp: , Rfl:    Oxycodone HCl 10 MG TABS, Take by mouth., Disp: , Rfl:  pantoprazole (PROTONIX) 40 MG tablet, Take 1 tablet (40 mg total) by mouth 2 (two) times daily. (Patient taking differently: Take 40 mg by mouth 3 times/day as needed-between meals & bedtime.), Disp: 180 tablet, Rfl: 1   sucralfate (CARAFATE) 1 g tablet, Take 1 tablet (1 g total) by mouth 4 (four) times daily., Disp: 120 tablet, Rfl: 1   tadalafil (CIALIS) 20 MG tablet, TAKE 1 TABLET BY MOUTH ONCE DAILY AS NEEDED FOR ERECTILE DYSFUNCTION, Disp: 30 tablet, Rfl: 5   triamcinolone ointment (KENALOG) 0.5 %, Apply 1 Application  topically 2 (two) times daily., Disp: 30 g, Rfl: 3   verapamil (CALAN) 120 MG tablet, Take 1 tablet (120 mg total) by mouth 2 (two) times daily., Disp: 180 tablet, Rfl: 3   Subjective:   PATIENT ID: Jake Elliott GENDER: male DOB: [birth year], MRN: [id number]  Chief Complaint  Patient presents with   Follow-up    PFT- SOB with exertion, dry cough and wheezing.     HPI ***  Ancillary information including prior medications, full medical/surgical/family/social histories, and PFTs (when available) are listed below and have been reviewed.   ROS   Objective:   Vitals:   05/07/23 1106  BP: (!) 140/78  Pulse: 75  Temp: 98.1 F (36.7 C)  TempSrc: Oral  SpO2: 98%  Weight: 250 lb 6.4 oz (113.6 kg)  Height: 5\' 11"  (1.803 m)   98% on *** LPM *** RA BMI Readings from Last 3 Encounters:  05/07/23 34.92 kg/m  04/13/23 33.75 kg/m  04/08/23 33.75 kg/m   Wt Readings from Last 3 Encounters:  05/07/23 250 lb 6.4 oz (113.6 kg)  04/13/23 242 lb (109.8 kg)  04/08/23 242 lb (109.8 kg)    Physical Exam    Ancillary Information    Past Medical History:  Diagnosis Date   Angina at rest [facility])    Anxiety    Attention-deficit/hyperactivity disorder    Bipolar disorder (HCC)    COPD (chronic obstructive pulmonary disease) (HCC)    Depression    GI bleed    History of tobacco abuse    Hypertension    Hypertrophic cardiomyopathy (HCC)    s/p myomectomy   Peripheral neuropathy    PTSD (post-traumatic stress disorder)      Family History  Problem Relation Age of Onset   Cancer - Lung Mother    Skin cancer Mother    Alcohol abuse Mother    Drug abuse Mother    Stroke Father    Hypertension Father    Alcohol abuse Father    Drug abuse Father    Drug abuse Sister      Past Surgical History:  Procedure Laterality Date   CARDIAC CATHETERIZATION     11/14/2010   CORONARY ARTERY BYPASS GRAFT     for myomectomy for hypertrophic cardiomyopathy    Social History    Socioeconomic History   Marital status: Single    Spouse name: Not on file   Number of children: Not on file   Years of education: Not on file   Highest education level: Not on file  Occupational History   Not on file  Tobacco Use   Smoking status: Every Day    Current packs/day: 2.00    Average packs/day: 2.0 packs/day for 34.3 years (68.7 ttl pk-yrs)    Types: Cigarettes    Start date: 12/31/1988   Smokeless tobacco: Never   Tobacco comments:    1 PPD (currently smoking)  Vaping Use  Vaping status: Never Used  Substance and Sexual Activity   Alcohol use: No    Alcohol/week: 0.0 standard drinks of alcohol   Drug use: No   Sexual activity: Yes    Partners: Female    Birth control/protection: None  Other Topics Concern   Not on file  Social History Narrative   Not on file   Social Determinants of Health   Financial Resource Strain: Not on file  Food Insecurity: Food Insecurity Present (03/23/2023)   Hunger Vital Sign    Worried About Running Out of Food in the Last Year: Sometimes true    Ran Out of Food in the Last Year: Sometimes true  Transportation Needs: No Transportation Needs (03/23/2023)   PRAPARE - Administrator, Civil Service (Medical): No    Lack of Transportation (Non-Medical): No  Physical Activity: Not on file  Stress: Not on file  Social Connections: Not on file  Intimate Partner Violence: Not At Risk (03/23/2023)   Humiliation, Afraid, Rape, and Kick questionnaire    Fear of Current or Ex-Partner: No    Emotionally Abused: No    Physically Abused: No    Sexually Abused: No     No Known Allergies   CBC    Component Value Date/Time   WBC 7.6 03/23/2023 1205   WBC 7.9 03/14/2023 1703   RBC 6.07 (H) 03/23/2023 1205   HGB 18.3 (H) 03/23/2023 1205   HGB CANCELED 07/05/2019 1530   HCT 53.1 (H) 03/23/2023 1205   HCT CANCELED 07/05/2019 1530   PLT 200 03/23/2023 1205   PLT CANCELED 07/05/2019 1530   MCV 87.5 03/23/2023 1205   MCV  82 06/14/2013 2021   MCH 30.1 03/23/2023 1205   MCHC 34.5 03/23/2023 1205   RDW 12.3 03/23/2023 1205   RDW 13.1 06/14/2013 2021   LYMPHSABS 1.6 03/11/2023 0812   LYMPHSABS CANCELED 07/05/2019 1530   LYMPHSABS 1.1 06/14/2013 2021   MONOABS 0.7 03/11/2023 0812   MONOABS 1.0 06/14/2013 2021   EOSABS 0.3 03/11/2023 0812   EOSABS CANCELED 07/05/2019 1530   EOSABS 0.3 06/14/2013 2021   BASOSABS 0.1 03/11/2023 0812   BASOSABS CANCELED 07/05/2019 1530   BASOSABS 0.1 06/14/2013 2021    Pulmonary Functions Testing Results:    Latest Ref Rng & Units 05/06/2023    3:40 PM  PFT Results  FVC-Pre L 4.09   FVC-Predicted Pre % 78   FVC-Post L 4.07   FVC-Predicted Post % 77   Pre FEV1/FVC % % 68   Post FEV1/FCV % % 77   FEV1-Pre L 2.78   FEV1-Predicted Pre % 68   FEV1-Post L 3.12   DLCO uncorrected ml/min/mmHg 22.12   DLCO UNC% % 73   DLVA Predicted % 96   TLC L 6.13   TLC % Predicted % 85   RV % Predicted % 99     Outpatient Medications Prior to Visit  Medication Sig Dispense Refill   albuterol (VENTOLIN HFA) 108 (90 Base) MCG/ACT inhaler Inhale 2 puffs into the lungs every 6 (six) hours as needed for wheezing or shortness of breath. 8 g 2   cyclobenzaprine (FLEXERIL) 10 MG tablet Take 1 tablet (10 mg total) by mouth 3 (three) times daily as needed for muscle spasms. This can make you sleepy. 90 tablet 1   dextroamphetamine (DEXTROSTAT) 10 MG tablet Take 10 mg by mouth daily.     diphenhydrAMINE (BENADRYL) 25 MG tablet Take 3 tablets (75 mg total) by mouth at  bedtime as needed. (Patient taking differently: Take 12.5 mg by mouth at bedtime as needed.) 90 tablet 1   metoCLOPramide (REGLAN) 10 MG tablet Take 1 tablet (10 mg total) by mouth every 6 (six) hours as needed. 30 tablet 0   metoprolol tartrate (LOPRESSOR) 100 MG tablet Take 1 tablet (100 mg total) by mouth once for 1 dose. Take 2 hours prior to your test. (Patient not taking: Reported on 04/08/2023) 1 tablet 0   metoprolol  tartrate (LOPRESSOR) 25 MG tablet Take 12.5 mg by mouth at bedtime.     Oxycodone HCl 10 MG TABS Take by mouth.     pantoprazole (PROTONIX) 40 MG tablet Take 1 tablet (40 mg total) by mouth 2 (two) times daily. (Patient taking differently: Take 40 mg by mouth 3 times/day as needed-between meals & bedtime.) 180 tablet 1   sucralfate (CARAFATE) 1 g tablet Take 1 tablet (1 g total) by mouth 4 (four) times daily. 120 tablet 1   tadalafil (CIALIS) 20 MG tablet TAKE 1 TABLET BY MOUTH ONCE DAILY AS NEEDED FOR ERECTILE DYSFUNCTION 30 tablet 5   triamcinolone ointment (KENALOG) 0.5 % Apply 1 Application topically 2 (two) times daily. 30 g 3   verapamil (CALAN) 120 MG tablet Take 1 tablet (120 mg total) by mouth 2 (two) times daily. 180 tablet 3   No facility-administered medications prior to visit.

## 2023-05-07 NOTE — Telephone Encounter (Signed)
Pt states CT was just read by radiology. He would like some kind of acknowledgment that you have read it.

## 2023-05-07 NOTE — Telephone Encounter (Signed)
I have, I've read the report and look at the pictures myself prior to seeing him today. No change to management, let's continue with the inhaler as prescribed and work on smoking cessation. Thank you

## 2023-05-11 DIAGNOSIS — F0634 Mood disorder due to known physiological condition with mixed features: Secondary | ICD-10-CM | POA: Diagnosis not present

## 2023-05-11 DIAGNOSIS — Z599 Problem related to housing and economic circumstances, unspecified: Secondary | ICD-10-CM | POA: Diagnosis not present

## 2023-05-11 DIAGNOSIS — F1111 Opioid abuse, in remission: Secondary | ICD-10-CM | POA: Diagnosis not present

## 2023-05-11 DIAGNOSIS — F900 Attention-deficit hyperactivity disorder, predominantly inattentive type: Secondary | ICD-10-CM | POA: Diagnosis not present

## 2023-05-11 DIAGNOSIS — F4312 Post-traumatic stress disorder, chronic: Secondary | ICD-10-CM | POA: Diagnosis not present

## 2023-05-11 DIAGNOSIS — Z62819 Personal history of unspecified abuse in childhood: Secondary | ICD-10-CM | POA: Diagnosis not present

## 2023-05-11 NOTE — Telephone Encounter (Deleted)
Pharmacy team patient states Breztri need a PA. Please advise.

## 2023-05-11 NOTE — Telephone Encounter (Signed)
Pharmacy team, patient states the [place] needs a PA.  Please advise.

## 2023-05-12 ENCOUNTER — Other Ambulatory Visit (HOSPITAL_COMMUNITY): Payer: Self-pay

## 2023-05-12 NOTE — Telephone Encounter (Signed)
Does patient have alternate insurance? NCTracks does not show active coverage for patient and information for [facility] that was given states that it termed 06-14-22 and provided other processing information only to have that not be active and route me back to the first set of processing information that termed in 2023

## 2023-05-16 DIAGNOSIS — Z419 Encounter for procedure for purposes other than remedying health state, unspecified: Secondary | ICD-10-CM | POA: Diagnosis not present

## 2023-05-17 ENCOUNTER — Other Ambulatory Visit (HOSPITAL_COMMUNITY): Payer: Self-pay

## 2023-05-17 ENCOUNTER — Telehealth: Payer: Self-pay

## 2023-05-17 DIAGNOSIS — J439 Emphysema, unspecified: Secondary | ICD-10-CM

## 2023-05-17 NOTE — Telephone Encounter (Signed)
Pharmacy Patient Advocate Encounter   Received notification from Pt Calls Messages that prior authorization for Breztri Aerosphere 160-9-4.8MCG/ACT aerosol  is required/requested.   Insurance verification completed.   The patient is insured through [facility] .   Per test claim: PA required; PA started via CoverMyMeds. KEY [id number] . Please see clinical question(s) below that I am not finding the answer to in her chart and advise.   Which preferred inhaled corticosteroid combination medications has the member had an inadequate response to after a therapeutic trial with each?  Advair Diskus/HFA and Dulera Advair Diskus/HFA and Symbicort Dulera and Symbicort

## 2023-05-17 NOTE — Telephone Encounter (Signed)
Attempted to contact patient. Call unable to be completed. Will call again.

## 2023-05-17 NOTE — Telephone Encounter (Signed)
I spoke with the patient. He looked it and confirmed over the phone that his Jake Elliott is still active.
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
## 2023-05-20 ENCOUNTER — Encounter: Payer: Self-pay | Admitting: Student in an Organized Health Care Education/Training Program

## 2023-05-20 ENCOUNTER — Ambulatory Visit: Payer: Medicaid Other | Attending: Student | Admitting: Student

## 2023-05-20 ENCOUNTER — Encounter: Payer: Self-pay | Admitting: Student

## 2023-05-20 VITALS — BP 125/82 | HR 81 | Ht 71.0 in | Wt 251.2 lb

## 2023-05-20 DIAGNOSIS — R0602 Shortness of breath: Secondary | ICD-10-CM

## 2023-05-20 DIAGNOSIS — R0789 Other chest pain: Secondary | ICD-10-CM | POA: Diagnosis not present

## 2023-05-20 DIAGNOSIS — I421 Obstructive hypertrophic cardiomyopathy: Secondary | ICD-10-CM | POA: Diagnosis not present

## 2023-05-20 DIAGNOSIS — I2510 Atherosclerotic heart disease of native coronary artery without angina pectoris: Secondary | ICD-10-CM

## 2023-05-20 MED ORDER — ALBUTEROL SULFATE HFA 108 (90 BASE) MCG/ACT IN AERS: 2.0000 | INHALATION_SPRAY | Freq: Four times a day (QID) | RESPIRATORY_TRACT | 11 refills | Status: DC | PRN

## 2023-05-20 MED ORDER — BUDESONIDE-FORMOTEROL FUMARATE 160-4.5 MCG/ACT IN AERO: 2.0000 | INHALATION_SPRAY | Freq: Two times a day (BID) | RESPIRATORY_TRACT | 5 refills | Status: DC

## 2023-05-20 NOTE — Patient Instructions (Signed)
Medication Instructions:  No changes *If you need a refill on your cardiac medications before your next appointment, please call your pharmacy*   Lab Work: None ordered If you have labs (blood work) drawn today and your tests are completely normal, you will receive your results only by: MyChart Message (if you have MyChart) OR A paper copy in the mail If you have any lab test that is abnormal or we need to change your treatment, we will call you to review the results.   Testing/Procedures: None ordered   Follow-Up: At [facility], you and your health needs are our priority.  As part of our continuing mission to provide you with exceptional heart care, we have created designated Provider Care Teams.  These Care Teams include your primary Cardiologist (physician) and Advanced Practice Providers (APPs -  Physician Assistants and Nurse Practitioners) who all work together to provide you with the care you need, when you need it.  We recommend signing up for the patient portal called "MyChart".  Sign up information is provided on this After Visit Summary.  MyChart is used to connect with patients for Virtual Visits (Telemedicine).  Patients are able to view lab/test results, encounter notes, upcoming appointments, etc.  Non-urgent messages can be sent to your provider as well.   To learn more about what you can do with MyChart, go to [email or web address].    Your next appointment:   6 month(s)  Provider:   You may see Julien Nordmann, MD or one of the following Advanced Practice Providers on your designated Care Team:   Carlos Levering, NP

## 2023-05-20 NOTE — Telephone Encounter (Signed)
See message from 05/17/2023.  Closing this encounter.

## 2023-05-20 NOTE — Telephone Encounter (Signed)
Would proceed with Symbicort 160/4.5, 2 inhalations twice a day and start with.  He may require addition of Spiriva if the Symbicort is not enough to control symptoms but lets start with the Symbicort for now.  May renew albuterol for as needed use.

## 2023-05-20 NOTE — Telephone Encounter (Signed)
Patient in the office now. He has not tried any other inhaler. He is also needing a refill on his Albuterol.  Dr. Aundria Rud is out of the office. Dr. .Jayme Cloud please advise.

## 2023-05-20 NOTE — Telephone Encounter (Signed)
I have sent in the prescriptions and notified the patient.  Nothing further needed. 

## 2023-05-26 ENCOUNTER — Other Ambulatory Visit: Payer: Self-pay | Admitting: Nurse Practitioner

## 2023-05-26 DIAGNOSIS — E349 Endocrine disorder, unspecified: Secondary | ICD-10-CM

## 2023-05-27 DIAGNOSIS — M5431 Sciatica, right side: Secondary | ICD-10-CM | POA: Diagnosis not present

## 2023-05-27 DIAGNOSIS — G894 Chronic pain syndrome: Secondary | ICD-10-CM | POA: Diagnosis not present

## 2023-05-27 DIAGNOSIS — Z79891 Long term (current) use of opiate analgesic: Secondary | ICD-10-CM | POA: Diagnosis not present

## 2023-05-27 DIAGNOSIS — M5432 Sciatica, left side: Secondary | ICD-10-CM | POA: Diagnosis not present

## 2023-05-27 DIAGNOSIS — M5416 Radiculopathy, lumbar region: Secondary | ICD-10-CM | POA: Diagnosis not present

## 2023-05-27 DIAGNOSIS — M5030 Other cervical disc degeneration, unspecified cervical region: Secondary | ICD-10-CM | POA: Diagnosis not present

## 2023-05-27 NOTE — Telephone Encounter (Signed)
Medication no longer listed on current medication list Requested Prescriptions  Pending Prescriptions Disp Refills   Testosterone 1.62 % GEL [Pharmacy Med Name: Testosterone 1.62 % Transdermal Gel] 75 g 0    Sig: APPLY TWO PUMPS ONTO THE SKIN DAILY     Off-Protocol Failed - 05/27/2023  8:51 AM      Failed - Medication not assigned to a protocol, review manually.      Passed - Valid encounter within last 12 months    Recent Outpatient Visits           4 months ago Lumbar spondylosis   [facility] Della Goo F, FNP   5 months ago Bipolar affective disorder, currently depressed, mild [facility])   [facility] Berniece Salines, FNP   6 months ago Testosterone deficiency   [facility] Berniece Salines, FNP   7 months ago Lumbar radiculopathy   [facility] Berniece Salines, FNP       Future Appointments             In 1 month [name], MD [facility] Pulmonary Care at [facility]

## 2023-05-28 NOTE — Telephone Encounter (Signed)
Copied from CRM [id number]. Topic: General - Inquiry >> May 28, 2023  1:40 PM Jake Elliott wrote: Patient called to see why his request to refill the Testosterone gel was denied. Chart shows medication was dc in October 24. Patient would like to continue to use medication. Please advise.

## 2023-06-01 NOTE — Telephone Encounter (Signed)
Copied from CRM [id number]. Topic: General - Other >> Jun 01, 2023  1:04 PM Everette C wrote: Reason for CRM: The patient has called to follow up on a previous request for contact regarding their Testosterone 40.5 MG/2.5GM (1.62%) GEL [[id number]]  DISCONTINUED prescription   The patient would like to follow up on the status of their request when possible

## 2023-06-03 DIAGNOSIS — M5432 Sciatica, left side: Secondary | ICD-10-CM | POA: Diagnosis not present

## 2023-06-03 DIAGNOSIS — G894 Chronic pain syndrome: Secondary | ICD-10-CM | POA: Diagnosis not present

## 2023-06-03 DIAGNOSIS — M47816 Spondylosis without myelopathy or radiculopathy, lumbar region: Secondary | ICD-10-CM | POA: Diagnosis not present

## 2023-06-03 DIAGNOSIS — Z79891 Long term (current) use of opiate analgesic: Secondary | ICD-10-CM | POA: Diagnosis not present

## 2023-06-03 DIAGNOSIS — M5030 Other cervical disc degeneration, unspecified cervical region: Secondary | ICD-10-CM | POA: Diagnosis not present

## 2023-06-07 DIAGNOSIS — F0634 Mood disorder due to known physiological condition with mixed features: Secondary | ICD-10-CM | POA: Diagnosis not present

## 2023-06-07 DIAGNOSIS — Z599 Problem related to housing and economic circumstances, unspecified: Secondary | ICD-10-CM | POA: Diagnosis not present

## 2023-06-07 DIAGNOSIS — Z62819 Personal history of unspecified abuse in childhood: Secondary | ICD-10-CM | POA: Diagnosis not present

## 2023-06-07 DIAGNOSIS — F900 Attention-deficit hyperactivity disorder, predominantly inattentive type: Secondary | ICD-10-CM | POA: Diagnosis not present

## 2023-06-07 DIAGNOSIS — F1111 Opioid abuse, in remission: Secondary | ICD-10-CM | POA: Diagnosis not present

## 2023-06-07 DIAGNOSIS — F4312 Post-traumatic stress disorder, chronic: Secondary | ICD-10-CM | POA: Diagnosis not present

## 2023-06-16 DIAGNOSIS — Z419 Encounter for procedure for purposes other than remedying health state, unspecified: Secondary | ICD-10-CM | POA: Diagnosis not present

## 2023-06-21 ENCOUNTER — Other Ambulatory Visit: Payer: Self-pay | Admitting: Nurse Practitioner

## 2023-06-21 DIAGNOSIS — M47812 Spondylosis without myelopathy or radiculopathy, cervical region: Secondary | ICD-10-CM

## 2023-06-21 DIAGNOSIS — M5416 Radiculopathy, lumbar region: Secondary | ICD-10-CM

## 2023-06-21 DIAGNOSIS — G8929 Other chronic pain: Secondary | ICD-10-CM

## 2023-06-21 DIAGNOSIS — M5030 Other cervical disc degeneration, unspecified cervical region: Secondary | ICD-10-CM

## 2023-06-21 DIAGNOSIS — M5412 Radiculopathy, cervical region: Secondary | ICD-10-CM

## 2023-06-21 DIAGNOSIS — M47816 Spondylosis without myelopathy or radiculopathy, lumbar region: Secondary | ICD-10-CM

## 2023-06-22 NOTE — Telephone Encounter (Signed)
 Requested medication (s) are due for refill today - yes  Requested medication (s) are on the active medication list -yes  Future visit scheduled -no  Last refill: 04/28/23 #90 1RF  Notes to clinic: non delegated Rx  Requested Prescriptions  Pending Prescriptions Disp Refills   cyclobenzaprine  (FLEXERIL ) 10 MG tablet [Pharmacy Med Name: Cyclobenzaprine  HCl 10 MG Oral Tablet] 90 tablet 0    Sig: TAKE 1 TABLET BY MOUTH THREE TIMES DAILY AS NEEDED FOR MUSCLE SPASM THIS  CAN  MAKE  YOU  SLEEPY     Not Delegated - Analgesics:  Muscle Relaxants Failed - 06/22/2023 12:17 PM      Failed - This refill cannot be delegated      Passed - Valid encounter within last 6 months    Recent Outpatient Visits           4 months ago Lumbar spondylosis   [facility] Gareth Mliss FALCON, FNP   6 months ago Bipolar affective disorder, currently depressed, mild [facility])   [facility] Gareth Mliss FALCON, FNP   7 months ago Testosterone  deficiency   [facility] Gareth Mliss FALCON, FNP   7 months ago Lumbar radiculopathy   [facility] Gareth Mliss FALCON, FNP       Future Appointments             In 2 weeks Isadora Hose, MD [facility] Pulmonary Care at [facility]               Requested Prescriptions  Pending Prescriptions Disp Refills   cyclobenzaprine  (FLEXERIL ) 10 MG tablet [Pharmacy Med Name: Cyclobenzaprine  HCl 10 MG Oral Tablet] 90 tablet 0    Sig: TAKE 1 TABLET BY MOUTH THREE TIMES DAILY AS NEEDED FOR MUSCLE SPASM THIS  CAN  MAKE  YOU  SLEEPY     Not Delegated - Analgesics:  Muscle Relaxants Failed - 06/22/2023 12:17 PM      Failed - This refill cannot be delegated      Passed - Valid encounter within last 6 months    Recent Outpatient Visits           4 months ago Lumbar spondylosis   [facility] Gareth Mliss FALCON, FNP   6 months ago Bipolar affective  disorder, currently depressed, mild [facility])   [facility] Gareth Mliss FALCON, FNP   7 months ago Testosterone  deficiency   [facility] Gareth Mliss FALCON, FNP   7 months ago Lumbar radiculopathy   [facility] Gareth Mliss FALCON, FNP       Future Appointments             In 2 weeks Isadora Hose, MD [facility] Pulmonary Care at [facility]

## 2023-06-23 ENCOUNTER — Other Ambulatory Visit: Payer: Self-pay | Admitting: Cardiovascular Disease

## 2023-06-24 DIAGNOSIS — G894 Chronic pain syndrome: Secondary | ICD-10-CM | POA: Diagnosis not present

## 2023-06-24 DIAGNOSIS — Z79891 Long term (current) use of opiate analgesic: Secondary | ICD-10-CM | POA: Diagnosis not present

## 2023-06-24 DIAGNOSIS — M5416 Radiculopathy, lumbar region: Secondary | ICD-10-CM | POA: Diagnosis not present

## 2023-06-24 DIAGNOSIS — M5030 Other cervical disc degeneration, unspecified cervical region: Secondary | ICD-10-CM | POA: Diagnosis not present

## 2023-06-24 DIAGNOSIS — M47816 Spondylosis without myelopathy or radiculopathy, lumbar region: Secondary | ICD-10-CM | POA: Diagnosis not present

## 2023-06-24 NOTE — Telephone Encounter (Signed)
Please advise if ok to refill Historical medication. 

## 2023-07-02 ENCOUNTER — Telehealth: Payer: Self-pay | Admitting: Nurse Practitioner

## 2023-07-02 NOTE — Telephone Encounter (Signed)
Spoke with patient and he was needing rfefill on his Testosterone medication, but was denied. He said that his levels would start to drop and he is aking if you know of any PCP'S that he could go to that will write this rx. He went to the dr you asked him to and one dr said for him just to get blood work done ever so often. He would like to stay here but needs to be able to get his medication. Please advise the patient. [name]

## 2023-07-05 NOTE — Telephone Encounter (Signed)
No vm set up. 

## 2023-07-09 ENCOUNTER — Ambulatory Visit: Payer: Medicaid Other | Admitting: Student in an Organized Health Care Education/Training Program

## 2023-07-14 ENCOUNTER — Ambulatory Visit (INDEPENDENT_AMBULATORY_CARE_PROVIDER_SITE_OTHER): Payer: Medicaid Other | Admitting: Student in an Organized Health Care Education/Training Program

## 2023-07-14 ENCOUNTER — Encounter: Payer: Self-pay | Admitting: Student in an Organized Health Care Education/Training Program

## 2023-07-14 VITALS — BP 120/86 | HR 81 | Temp 97.6°F | Ht 71.0 in | Wt 245.4 lb

## 2023-07-14 DIAGNOSIS — J4540 Moderate persistent asthma, uncomplicated: Secondary | ICD-10-CM | POA: Diagnosis not present

## 2023-07-14 DIAGNOSIS — I2699 Other pulmonary embolism without acute cor pulmonale: Secondary | ICD-10-CM | POA: Diagnosis not present

## 2023-07-14 DIAGNOSIS — F17200 Nicotine dependence, unspecified, uncomplicated: Secondary | ICD-10-CM | POA: Diagnosis not present

## 2023-07-14 DIAGNOSIS — R053 Chronic cough: Secondary | ICD-10-CM | POA: Diagnosis not present

## 2023-07-14 DIAGNOSIS — J439 Emphysema, unspecified: Secondary | ICD-10-CM

## 2023-07-14 MED ORDER — NICOTINE 7 MG/24HR TD PT24: 7.0000 mg | MEDICATED_PATCH | TRANSDERMAL | 0 refills | Status: AC

## 2023-07-14 MED ORDER — SPIRIVA RESPIMAT 2.5 MCG/ACT IN AERS: 2.0000 | INHALATION_SPRAY | Freq: Every day | RESPIRATORY_TRACT | 11 refills | Status: DC

## 2023-07-14 MED ORDER — NICOTINE 14 MG/24HR TD PT24: 14.0000 mg | MEDICATED_PATCH | TRANSDERMAL | 0 refills | Status: AC

## 2023-07-14 MED ORDER — NICOTINE 21 MG/24HR TD PT24: 21.0000 mg | MEDICATED_PATCH | TRANSDERMAL | 0 refills | Status: AC

## 2023-07-14 NOTE — Progress Notes (Signed)
Assessment & Plan:   #Chronic cough #Moderate persistent asthma without complication #Asthma/COPD overlap   Patient is presenting for follow-up of cough that has been persistent.  Exam remains reassuring without any wheeze but he continues to cough.  Imaging previously noted emphysema in the upper lobes which is expected given his history of smoking.  FeNO in clinic was previously unremarkable at 11 ppb.  He has underwent pulmonary function testing which showed an obstructive pattern with significant response to bronchodilator challenge.  FEV1/FVC ratio postbronchodilator challenge was 0.77 arguing for asthma and against COPD, asthma/COPD overlap is also a consideration given the emphysema noted on imaging.  Lung volumes are within normal while DLCO is very mildly reduced at 73% of predicted. Also on the differential for the patient's cough is EDAC/TBM which we will workup with a dedicated dynamic airway CT. Furthermore, given finding of obstruction and reversibility with bronchodilator challenge, I initiated ICS/LABA therapy. He is on Symbicort and we will add Spiriva Respimat to his regimen for triple therapy effect.   - Tiotropium Bromide Monohydrate (SPIRIVA RESPIMAT) 2.5 MCG/ACT AERS; Inhale 2 puffs into the lungs daily.  Dispense: 60 each; Refill: 11 - Continue Symbicort two puffs twice daily - CT CHEST HIGH RESOLUTION; Future  #Tobacco use disorder  Again counseled on the importance of smoking cessation. He couldn't tolerate the use of nicotine lozenges, and we will initiate nicotine patches today.  - nicotine (NICODERM CQ - DOSED IN MG/24 HOURS) 21 mg/24hr patch; Place 1 patch (21 mg total) onto the skin daily.  Dispense: 42 patch; Refill: 0 - nicotine (NICODERM CQ - DOSED IN MG/24 HOURS) 14 mg/24hr patch; Place 1 patch (14 mg total) onto the skin daily for 14 days.  Dispense: 14 patch; Refill: 0 - nicotine (NICODERM CQ - DOSED IN MG/24 HR) 7 mg/24hr patch; Place 1 patch (7 mg total)  onto the skin daily for 14 days.  Dispense: 14 patch; Refill: 0  #Thrombus of pulmonary vein  Patient was incidentally found to have a pulmonary vein thrombus on imaging in the past. No clear consensus guidelines on management of pulmonary vein thrombosus but I did discuss my recommendation to continue anti-coagulation with Mr. Erven in the past. He wanted to come off Eliquis and hasn't restarted it since our last visit.   Return in about 3 months (around 10/12/2023).  I spent 33 minutes caring for this patient today, including preparing to see the patient, obtaining a medical history , reviewing a separately obtained history, performing a medically appropriate examination and/or evaluation, counseling and educating the patient/family/caregiver, ordering medications, tests, or procedures, documenting clinical information in the electronic health record, and independently interpreting results (not separately reported/billed) and communicating results to the patient/family/caregiver. 3 minutes utilized counseling patient regarding importance of smoking cessation.  [name], MD [facility]  End of visit medications:  Meds ordered this encounter  Medications   Tiotropium Bromide Monohydrate (SPIRIVA RESPIMAT) 2.5 MCG/ACT AERS    Sig: Inhale 2 puffs into the lungs daily.    Dispense:  60 each    Refill:  11   nicotine (NICODERM CQ - DOSED IN MG/24 HOURS) 21 mg/24hr patch    Sig: Place 1 patch (21 mg total) onto the skin daily.    Dispense:  42 patch    Refill:  0   nicotine (NICODERM CQ - DOSED IN MG/24 HOURS) 14 mg/24hr patch    Sig: Place 1 patch (14 mg total) onto the skin daily for 14  days.    Dispense:  14 patch    Refill:  0   nicotine (NICODERM CQ - DOSED IN MG/24 HR) 7 mg/24hr patch    Sig: Place 1 patch (7 mg total) onto the skin daily for 14 days.    Dispense:  14 patch    Refill:  0     Current Outpatient Medications:    albuterol (VENTOLIN HFA)  108 (90 Base) MCG/ACT inhaler, Inhale 2 puffs into the lungs every 6 (six) hours as needed for wheezing or shortness of breath., Disp: 8 g, Rfl: 11   budesonide-formoterol (SYMBICORT) 160-4.5 MCG/ACT inhaler, Inhale 2 puffs into the lungs in the morning and at bedtime., Disp: 1 each, Rfl: 5   cyclobenzaprine (FLEXERIL) 10 MG tablet, TAKE 1 TABLET BY MOUTH THREE TIMES DAILY AS NEEDED FOR MUSCLE SPASM THIS  CAN  MAKE  YOU  SLEEPY, Disp: 90 tablet, Rfl: 0   dextroamphetamine (DEXTROSTAT) 10 MG tablet, Take 10 mg by mouth daily., Disp: , Rfl:    diphenhydrAMINE (BENADRYL) 25 MG tablet, Take 3 tablets (75 mg total) by mouth at bedtime as needed. (Patient taking differently: Take 12.5 mg by mouth at bedtime as needed.), Disp: 90 tablet, Rfl: 1   metoCLOPramide (REGLAN) 10 MG tablet, Take 1 tablet (10 mg total) by mouth every 6 (six) hours as needed., Disp: 30 tablet, Rfl: 0   metoprolol tartrate (LOPRESSOR) 25 MG tablet, Take 0.5 tablets (12.5 mg total) by mouth at bedtime., Disp: 90 tablet, Rfl: 3   nicotine (NICODERM CQ - DOSED IN MG/24 HOURS) 14 mg/24hr patch, Place 1 patch (14 mg total) onto the skin daily for 14 days., Disp: 14 patch, Rfl: 0   nicotine (NICODERM CQ - DOSED IN MG/24 HOURS) 21 mg/24hr patch, Place 1 patch (21 mg total) onto the skin daily., Disp: 42 patch, Rfl: 0   nicotine (NICODERM CQ - DOSED IN MG/24 HR) 7 mg/24hr patch, Place 1 patch (7 mg total) onto the skin daily for 14 days., Disp: 14 patch, Rfl: 0   nicotine polacrilex (NICOTINE MINI) 2 MG lozenge, Take 1 lozenge (2 mg total) by mouth every 2 (two) hours as needed for smoking cessation., Disp: 72 lozenge, Rfl: 3   Oxycodone HCl 10 MG TABS, Take by mouth., Disp: , Rfl:    pantoprazole (PROTONIX) 40 MG tablet, Take 1 tablet (40 mg total) by mouth 2 (two) times daily. (Patient taking differently: Take 40 mg by mouth 3 times/day as needed-between meals & bedtime.), Disp: 180 tablet, Rfl: 1   Spacer/Aero-Holding Chambers (AEROCHAMBER  MV) inhaler, Use as instructed, Disp: 1 each, Rfl: 0   sucralfate (CARAFATE) 1 g tablet, Take 1 tablet (1 g total) by mouth 4 (four) times daily., Disp: 120 tablet, Rfl: 1   tadalafil (CIALIS) 20 MG tablet, TAKE 1 TABLET BY MOUTH ONCE DAILY AS NEEDED FOR ERECTILE DYSFUNCTION, Disp: 30 tablet, Rfl: 5   Tiotropium Bromide Monohydrate (SPIRIVA RESPIMAT) 2.5 MCG/ACT AERS, Inhale 2 puffs into the lungs daily., Disp: 60 each, Rfl: 11   triamcinolone ointment (KENALOG) 0.5 %, Apply 1 Application topically 2 (two) times daily., Disp: 30 g, Rfl: 3   verapamil (CALAN) 120 MG tablet, Take 1 tablet (120 mg total) by mouth 2 (two) times daily., Disp: 180 tablet, Rfl: 3   Subjective:   PATIENT ID: Jake Elliott GENDER: male DOB: 11/16/1970, MRN: [id number]  Chief Complaint  Patient presents with   Follow-up    Shortness of breath on exertion and at rest.  Wheezing.     HPI  Mr. Googe is a 53 year old male presenting to clinic for follow up. I had initially seen him for the the evaluation of cough as well as a pulmonary vein embolus. He presents for follow up of cough.  During our last visit, I initiate triple therapy with ICS/LABA/LAMA but this was not covered by insurance. He was started on ICS/LABA with Symbicort, and reports some improvement in his symptoms. He's used the symbicort 2 to 3 times a day. His cough has improved, but is not fully resolved. He continues to cough, especially at night. This happens in bouts and are not triggered. He did cough multiple times during our clinic visit. Cough is mostly dry, with occasional white sputum. He reports also using the albuterol inhaler with some improvement. Continues to deny fever/chills/night sweats. He continues to be somewhat short of breath with exertion, but better than prior.    Patient was in the ED in January of 2024 secondary to acute cough. During his ED stay, blood work and viral testing were normal/negative, while a CTA of the chest  was notable for a small thrombus in the lower branch of the left pulmonary vein. He was discharged on Eliquis and asked to follow up with pulmonology. He requested to be discontinued off Eliquis during a prior visit and has been off of it. His last visit to the ED was in September of 2024 for chest pain with reassuring workup. He follows with cardiology as well as Dr. Orlie Dakin from oncology for polycythemia.   Patient unfortunately continues to smoke, but less than prior. He has done so for over 15 years. He denies any vaping. He denies any current illicit drug use. 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   CT Chest PE protocol 10/09/2022   Lungs/Pleura: Moderate emphysematous changes are again seen. There are minimal atelectatic changes in both lung bases. There is a 4 mm nodule in the left lower lobe image 9/91 which is unchanged from prior. No pleural effusion or pneumothorax. Trachea and central airways are patent   Cardiovascular: There is adequate opacification of the pulmonary arteries to the segmental level. There is no evidence for pulmonary embolism. Patient is status post cardiac surgery. The heart is mildly enlarged. Aorta is normal in size. There is no pericardial effusion.  Ancillary information including prior medications, full medical/surgical/family/social histories, and PFTs (when available) are listed below and have been reviewed.   Review of Systems  Constitutional:  Negative for chills, fever, malaise/fatigue and weight loss.  Respiratory:  Positive for cough. Negative for hemoptysis, sputum production, shortness of breath and wheezing.   Cardiovascular:  Negative for chest pain.     Objective:   Vitals:   07/14/23 1624  BP: 120/86  Pulse: 81  Temp: 97.6 F (36.4 C)  TempSrc: Temporal  SpO2: 96%  Weight: 245 lb 6.4 oz (111.3 kg)  Height: 5\' 11"  (1.803 m)   96% on RA BMI Readings from Last 3 Encounters:  07/14/23  34.23 kg/m  05/20/23 35.04 kg/m  05/07/23 34.92 kg/m   Wt Readings from Last 3 Encounters:  07/14/23 245 lb 6.4 oz (111.3 kg)  05/20/23 251 lb 3.2 oz (113.9 kg)  05/07/23 250 lb 6.4 oz (113.6 kg)    Physical Exam Constitutional:      Appearance: Normal appearance. He is not ill-appearing.  HENT:     Mouth/Throat:     Comments: Would  not let me examine  Cardiovascular:     Rate and Rhythm: Normal rate and regular rhythm.     Pulses: Normal pulses.     Heart sounds: Normal heart sounds.  Pulmonary:     Effort: Pulmonary effort is normal.     Breath sounds: Normal breath sounds. No wheezing or rales.  Abdominal:     Palpations: Abdomen is soft.  Musculoskeletal:        General: Normal range of motion.  Neurological:     General: No focal deficit present.     Mental Status: He is alert.       Ancillary Information    Past Medical History:  Diagnosis Date   Angina at rest [facility])    Anxiety    Attention-deficit/hyperactivity disorder    Bipolar disorder (HCC)    COPD (chronic obstructive pulmonary disease) (HCC)    Depression    GI bleed    History of tobacco abuse    Hypertension    Hypertrophic cardiomyopathy (HCC)    s/p myomectomy   Peripheral neuropathy    PTSD (post-traumatic stress disorder)      Family History  Problem Relation Age of Onset   Cancer - Lung Mother    Skin cancer Mother    Alcohol abuse Mother    Drug abuse Mother    Stroke Father    Hypertension Father    Alcohol abuse Father    Drug abuse Father    Drug abuse Sister      Past Surgical History:  Procedure Laterality Date   CARDIAC CATHETERIZATION     11/14/2010   CORONARY ARTERY BYPASS GRAFT     for myomectomy for hypertrophic cardiomyopathy    Social History   Socioeconomic History   Marital status: Single    Spouse name: Not on file   Number of children: Not on file   Years of education: Not on file   Highest education level: Not on file  Occupational History    Not on file  Tobacco Use   Smoking status: Every Day    Current packs/day: 2.00    Average packs/day: 2.0 packs/day for 34.5 years (69.1 ttl pk-yrs)    Types: Cigarettes    Start date: 12/31/1988   Smokeless tobacco: Never   Tobacco comments:    1 PPD (currently smoking)  Vaping Use   Vaping status: Never Used  Substance and Sexual Activity   Alcohol use: No    Alcohol/week: 0.0 standard drinks of alcohol   Drug use: No   Sexual activity: Yes    Partners: Female    Birth control/protection: None  Other Topics Concern   Not on file  Social History Narrative   Not on file   Social Drivers of Health   Financial Resource Strain: Not on file  Food Insecurity: Food Insecurity Present (03/23/2023)   Hunger Vital Sign    Worried About Running Out of Food in the Last Year: Sometimes true    Ran Out of Food in the Last Year: Sometimes true  Transportation Needs: No Transportation Needs (03/23/2023)   PRAPARE - Administrator, Civil Service (Medical): No    Lack of Transportation (Non-Medical): No  Physical Activity: Not on file  Stress: Not on file  Social Connections: Not on file  Intimate Partner Violence: Not At Risk (03/23/2023)   Humiliation, Afraid, Rape, and Kick questionnaire    Fear of Current or Ex-Partner: No    Emotionally Abused: No  Physically Abused: No    Sexually Abused: No     No Known Allergies   CBC    Component Value Date/Time   WBC 7.6 03/23/2023 1205   WBC 7.9 03/14/2023 1703   RBC 6.07 (H) 03/23/2023 1205   HGB 18.3 (H) 03/23/2023 1205   HGB CANCELED 07/05/2019 1530   HCT 53.1 (H) 03/23/2023 1205   HCT CANCELED 07/05/2019 1530   PLT 200 03/23/2023 1205   PLT CANCELED 07/05/2019 1530   MCV 87.5 03/23/2023 1205   MCV 82 06/14/2013 2021   MCH 30.1 03/23/2023 1205   MCHC 34.5 03/23/2023 1205   RDW 12.3 03/23/2023 1205   RDW 13.1 06/14/2013 2021   LYMPHSABS 1.6 03/11/2023 0812   LYMPHSABS CANCELED 07/05/2019 1530   LYMPHSABS 1.1  06/14/2013 2021   MONOABS 0.7 03/11/2023 0812   MONOABS 1.0 06/14/2013 2021   EOSABS 0.3 03/11/2023 0812   EOSABS CANCELED 07/05/2019 1530   EOSABS 0.3 06/14/2013 2021   BASOSABS 0.1 03/11/2023 0812   BASOSABS CANCELED 07/05/2019 1530   BASOSABS 0.1 06/14/2013 2021    Pulmonary Functions Testing Results:    Latest Ref Rng & Units 05/06/2023    3:40 PM  PFT Results  FVC-Pre L 4.09   FVC-Predicted Pre % 78   FVC-Post L 4.07   FVC-Predicted Post % 77   Pre FEV1/FVC % % 68   Post FEV1/FCV % % 77   FEV1-Pre L 2.78   FEV1-Predicted Pre % 68   FEV1-Post L 3.12   DLCO uncorrected ml/min/mmHg 22.12   DLCO UNC% % 73   DLVA Predicted % 96   TLC L 6.13   TLC % Predicted % 85   RV % Predicted % 99     Outpatient Medications Prior to Visit  Medication Sig Dispense Refill   albuterol (VENTOLIN HFA) 108 (90 Base) MCG/ACT inhaler Inhale 2 puffs into the lungs every 6 (six) hours as needed for wheezing or shortness of breath. 8 g 11   budesonide-formoterol (SYMBICORT) 160-4.5 MCG/ACT inhaler Inhale 2 puffs into the lungs in the morning and at bedtime. 1 each 5   cyclobenzaprine (FLEXERIL) 10 MG tablet TAKE 1 TABLET BY MOUTH THREE TIMES DAILY AS NEEDED FOR MUSCLE SPASM THIS  CAN  MAKE  YOU  SLEEPY 90 tablet 0   dextroamphetamine (DEXTROSTAT) 10 MG tablet Take 10 mg by mouth daily.     diphenhydrAMINE (BENADRYL) 25 MG tablet Take 3 tablets (75 mg total) by mouth at bedtime as needed. (Patient taking differently: Take 12.5 mg by mouth at bedtime as needed.) 90 tablet 1   metoCLOPramide (REGLAN) 10 MG tablet Take 1 tablet (10 mg total) by mouth every 6 (six) hours as needed. 30 tablet 0   metoprolol tartrate (LOPRESSOR) 25 MG tablet Take 0.5 tablets (12.5 mg total) by mouth at bedtime. 90 tablet 3   nicotine polacrilex (NICOTINE MINI) 2 MG lozenge Take 1 lozenge (2 mg total) by mouth every 2 (two) hours as needed for smoking cessation. 72 lozenge 3   Oxycodone HCl 10 MG TABS Take by mouth.      pantoprazole (PROTONIX) 40 MG tablet Take 1 tablet (40 mg total) by mouth 2 (two) times daily. (Patient taking differently: Take 40 mg by mouth 3 times/day as needed-between meals & bedtime.) 180 tablet 1   Spacer/Aero-Holding Chambers (AEROCHAMBER MV) inhaler Use as instructed 1 each 0   sucralfate (CARAFATE) 1 g tablet Take 1 tablet (1 g total) by mouth 4 (four) times  daily. 120 tablet 1   tadalafil (CIALIS) 20 MG tablet TAKE 1 TABLET BY MOUTH ONCE DAILY AS NEEDED FOR ERECTILE DYSFUNCTION 30 tablet 5   triamcinolone ointment (KENALOG) 0.5 % Apply 1 Application topically 2 (two) times daily. 30 g 3   verapamil (CALAN) 120 MG tablet Take 1 tablet (120 mg total) by mouth 2 (two) times daily. 180 tablet 3   No facility-administered medications prior to visit.

## 2023-07-15 DIAGNOSIS — F1111 Opioid abuse, in remission: Secondary | ICD-10-CM | POA: Diagnosis not present

## 2023-07-15 DIAGNOSIS — F4312 Post-traumatic stress disorder, chronic: Secondary | ICD-10-CM | POA: Diagnosis not present

## 2023-07-15 DIAGNOSIS — F1411 Cocaine abuse, in remission: Secondary | ICD-10-CM | POA: Diagnosis not present

## 2023-07-15 DIAGNOSIS — Z599 Problem related to housing and economic circumstances, unspecified: Secondary | ICD-10-CM | POA: Diagnosis not present

## 2023-07-15 DIAGNOSIS — Z62819 Personal history of unspecified abuse in childhood: Secondary | ICD-10-CM | POA: Diagnosis not present

## 2023-07-15 DIAGNOSIS — F900 Attention-deficit hyperactivity disorder, predominantly inattentive type: Secondary | ICD-10-CM | POA: Diagnosis not present

## 2023-07-15 DIAGNOSIS — F0634 Mood disorder due to known physiological condition with mixed features: Secondary | ICD-10-CM | POA: Diagnosis not present

## 2023-07-16 ENCOUNTER — Encounter: Payer: Self-pay | Admitting: *Deleted

## 2023-07-17 DIAGNOSIS — Z419 Encounter for procedure for purposes other than remedying health state, unspecified: Secondary | ICD-10-CM | POA: Diagnosis not present

## 2023-07-22 ENCOUNTER — Other Ambulatory Visit: Payer: Self-pay | Admitting: Nurse Practitioner

## 2023-07-22 DIAGNOSIS — M47812 Spondylosis without myelopathy or radiculopathy, cervical region: Secondary | ICD-10-CM

## 2023-07-22 DIAGNOSIS — G894 Chronic pain syndrome: Secondary | ICD-10-CM | POA: Diagnosis not present

## 2023-07-22 DIAGNOSIS — M5416 Radiculopathy, lumbar region: Secondary | ICD-10-CM | POA: Diagnosis not present

## 2023-07-22 DIAGNOSIS — M47816 Spondylosis without myelopathy or radiculopathy, lumbar region: Secondary | ICD-10-CM | POA: Diagnosis not present

## 2023-07-22 DIAGNOSIS — G8929 Other chronic pain: Secondary | ICD-10-CM

## 2023-07-22 DIAGNOSIS — M5412 Radiculopathy, cervical region: Secondary | ICD-10-CM

## 2023-07-22 DIAGNOSIS — Z79891 Long term (current) use of opiate analgesic: Secondary | ICD-10-CM | POA: Diagnosis not present

## 2023-07-22 DIAGNOSIS — M5030 Other cervical disc degeneration, unspecified cervical region: Secondary | ICD-10-CM

## 2023-07-23 ENCOUNTER — Ambulatory Visit
Admission: RE | Admit: 2023-07-23 | Discharge: 2023-07-23 | Disposition: A | Discharge status: Home / Self Care | Payer: Medicaid Other | Source: Ambulatory Visit | Attending: Student in an Organized Health Care Education/Training Program | Admitting: Student in an Organized Health Care Education/Training Program

## 2023-07-23 DIAGNOSIS — R053 Chronic cough: Secondary | ICD-10-CM | POA: Diagnosis not present

## 2023-07-23 DIAGNOSIS — J439 Emphysema, unspecified: Secondary | ICD-10-CM | POA: Diagnosis not present

## 2023-07-23 DIAGNOSIS — J432 Centrilobular emphysema: Secondary | ICD-10-CM | POA: Diagnosis not present

## 2023-07-23 NOTE — Telephone Encounter (Signed)
 Requested medications are due for refill today.  yes  Requested medications are on the active medications list.  yes  Last refill. 06/23/2023 #90 0 rf  Future visit scheduled.   no  Notes to clinic.  Refill not delegated.    Requested Prescriptions  Pending Prescriptions Disp Refills   cyclobenzaprine  (FLEXERIL ) 10 MG tablet [Pharmacy Med Name: Cyclobenzaprine  HCl 10 MG Oral Tablet] 90 tablet 0    Sig: TAKE 1 TABLET BY MOUTH THREE TIMES DAILY AS NEEDED FOR MUSCLE SPASM THIS  CAN  MAKE  YOU  SLEEPY     Not Delegated - Analgesics:  Muscle Relaxants Failed - 07/23/2023  1:20 PM      Failed - This refill cannot be delegated      Passed - Valid encounter within last 6 months    Recent Outpatient Visits           5 months ago Lumbar spondylosis   [facility] Gareth Mliss FALCON, FNP   7 months ago Bipolar affective disorder, currently depressed, mild [facility])   [facility] Gareth Mliss FALCON, FNP   8 months ago Testosterone  deficiency   [facility] Gareth Mliss FALCON, FNP   8 months ago Lumbar radiculopathy   [facility] Gareth Mliss FALCON, FNP       Future Appointments             In 1 week Isadora Hose, MD [facility] Pulmonary Care at [facility]

## 2023-08-03 DIAGNOSIS — Z599 Problem related to housing and economic circumstances, unspecified: Secondary | ICD-10-CM | POA: Diagnosis not present

## 2023-08-03 DIAGNOSIS — F1111 Opioid abuse, in remission: Secondary | ICD-10-CM | POA: Diagnosis not present

## 2023-08-03 DIAGNOSIS — F4312 Post-traumatic stress disorder, chronic: Secondary | ICD-10-CM | POA: Diagnosis not present

## 2023-08-03 DIAGNOSIS — F900 Attention-deficit hyperactivity disorder, predominantly inattentive type: Secondary | ICD-10-CM | POA: Diagnosis not present

## 2023-08-03 DIAGNOSIS — Z62819 Personal history of unspecified abuse in childhood: Secondary | ICD-10-CM | POA: Diagnosis not present

## 2023-08-03 DIAGNOSIS — F0634 Mood disorder due to known physiological condition with mixed features: Secondary | ICD-10-CM | POA: Diagnosis not present

## 2023-08-04 ENCOUNTER — Ambulatory Visit: Payer: Medicaid Other | Admitting: Student in an Organized Health Care Education/Training Program

## 2023-08-11 ENCOUNTER — Encounter: Payer: Self-pay | Admitting: Student in an Organized Health Care Education/Training Program

## 2023-08-11 ENCOUNTER — Ambulatory Visit: Payer: Medicaid Other | Admitting: Student in an Organized Health Care Education/Training Program

## 2023-08-11 VITALS — BP 132/88 | HR 93 | Temp 97.6°F | Ht 71.0 in | Wt 246.6 lb

## 2023-08-11 DIAGNOSIS — J4540 Moderate persistent asthma, uncomplicated: Secondary | ICD-10-CM | POA: Diagnosis not present

## 2023-08-11 DIAGNOSIS — Z86711 Personal history of pulmonary embolism: Secondary | ICD-10-CM

## 2023-08-11 DIAGNOSIS — J439 Emphysema, unspecified: Secondary | ICD-10-CM

## 2023-08-11 DIAGNOSIS — R053 Chronic cough: Secondary | ICD-10-CM

## 2023-08-11 DIAGNOSIS — J398 Other specified diseases of upper respiratory tract: Secondary | ICD-10-CM

## 2023-08-11 DIAGNOSIS — F17200 Nicotine dependence, unspecified, uncomplicated: Secondary | ICD-10-CM

## 2023-08-11 DIAGNOSIS — I2699 Other pulmonary embolism without acute cor pulmonale: Secondary | ICD-10-CM

## 2023-08-14 DIAGNOSIS — Z419 Encounter for procedure for purposes other than remedying health state, unspecified: Secondary | ICD-10-CM | POA: Diagnosis not present

## 2023-08-14 NOTE — Progress Notes (Signed)
 Assessment & Plan:   #Chronic cough (Primary) #Moderate persistent asthma without complication #Asthma/COPD Overlap #Tracheobronchomalacia  Patient is presenting for follow-up of cough that has been persistent.  Exam remains reassuring without any wheeze but he continues to cough.  Imaging previously noted emphysema in the upper lobes which is expected given his history of smoking.  FeNO in clinic was previously unremarkable at 11 ppb.  He has underwent pulmonary function testing which showed an obstructive pattern with significant response to bronchodilator challenge.  FEV1/FVC ratio postbronchodilator challenge was 0.77 arguing for asthma and against COPD, asthma/COPD overlap is also a consideration given the emphysema noted on imaging.  Lung volumes are within normal while DLCO is very mildly reduced at 73% of predicted. Also on the differential for the patient's cough is EDAC/TBM.  He is currenlty on ICS/LABA/LAMA therapy with Symbicort and Spiriva respimat with improvement in his symptoms. He had his dynamic airway CT that showed less than 50% colapse in the trachea per report. I have personally reviewed his dynamic CT and evaluated the airway including the mainstem and segmental bronchi. While the report doesn't comment on bronchomalacia, the appearance of the airways on expiratory films is highly suggestive of bronchomalacia that is likely exacerbating his symptoms. I did discuss this at length with Jake Elliott and explained that this is likely contributing to his symptoms, in addition to the underlying asthma with likely COPD overlap. Discussed that management would be supportive at this point and we will continue with inhaler therapy.  -continue Spiriva Respimat and Symbicort bid -full smoking cessation   #Thrombus of pulmonary vein (HCC)  Patient was incidentally found to have a pulmonary vein thrombus on imaging in the past. No clear consensus guidelines on management of pulmonary  vein thrombosus but I did discuss my recommendation to continue anti-coagulation with Jake Elliott in the past. He wanted to come off Eliquis and hasn't restarted it since our last visit.  #Tobacco use disorder  Again counseled on the importance of smoking cessation.   Return in about 6 months (around 02/08/2024).  I spent 30 minutes caring for this patient today, including preparing to see the patient, obtaining a medical history , reviewing a separately obtained history, performing a medically appropriate examination and/or evaluation, counseling and educating the patient/family/caregiver, ordering medications, tests, or procedures, documenting clinical information in the electronic health record, and independently interpreting results (not separately reported/billed) and communicating results to the patient/family/caregiver  Jake Chute, MD [facility]   End of visit medications:  No orders of the defined types were placed in this encounter.    Current Outpatient Medications:    albuterol (VENTOLIN HFA) 108 (90 Base) MCG/ACT inhaler, Inhale 2 puffs into the lungs every 6 (six) hours as needed for wheezing or shortness of breath., Disp: 8 g, Rfl: 11   budesonide-formoterol (SYMBICORT) 160-4.5 MCG/ACT inhaler, Inhale 2 puffs into the lungs in the morning and at bedtime., Disp: 1 each, Rfl: 5   cyclobenzaprine (FLEXERIL) 10 MG tablet, TAKE 1 TABLET BY MOUTH THREE TIMES DAILY AS NEEDED FOR MUSCLE SPASM. THIS CAN MAKE YOU SLEEPY, Disp: 90 tablet, Rfl: 0   dextroamphetamine (DEXTROSTAT) 10 MG tablet, Take 10 mg by mouth daily., Disp: , Rfl:    diphenhydrAMINE (BENADRYL) 25 MG tablet, Take 3 tablets (75 mg total) by mouth at bedtime as needed. (Patient taking differently: Take 12.5 mg by mouth at bedtime as needed.), Disp: 90 tablet, Rfl: 1   metoCLOPramide (REGLAN) 10 MG tablet, Take 1  tablet (10 mg total) by mouth every 6 (six) hours as needed., Disp: 30 tablet, Rfl: 0    metoprolol tartrate (LOPRESSOR) 25 MG tablet, Take 0.5 tablets (12.5 mg total) by mouth at bedtime., Disp: 90 tablet, Rfl: 3   nicotine (NICODERM CQ - DOSED IN MG/24 HOURS) 21 mg/24hr patch, Place 1 patch (21 mg total) onto the skin daily., Disp: 42 patch, Rfl: 0   Oxycodone HCl 10 MG TABS, Take by mouth., Disp: , Rfl:    pantoprazole (PROTONIX) 40 MG tablet, Take 1 tablet (40 mg total) by mouth 2 (two) times daily. (Patient taking differently: Take 40 mg by mouth 3 times/day as needed-between meals & bedtime.), Disp: 180 tablet, Rfl: 1   Spacer/Aero-Holding Chambers (AEROCHAMBER MV) inhaler, Use as instructed, Disp: 1 each, Rfl: 0   sucralfate (CARAFATE) 1 g tablet, Take 1 tablet (1 g total) by mouth 4 (four) times daily., Disp: 120 tablet, Rfl: 1   tadalafil (CIALIS) 20 MG tablet, TAKE 1 TABLET BY MOUTH ONCE DAILY AS NEEDED FOR ERECTILE DYSFUNCTION, Disp: 30 tablet, Rfl: 5   Tiotropium Bromide Monohydrate (SPIRIVA RESPIMAT) 2.5 MCG/ACT AERS, Inhale 2 puffs into the lungs daily., Disp: 60 each, Rfl: 11   triamcinolone ointment (KENALOG) 0.5 %, Apply 1 Application topically 2 (two) times daily., Disp: 30 g, Rfl: 3   verapamil (CALAN) 120 MG tablet, Take 1 tablet (120 mg total) by mouth 2 (two) times daily., Disp: 180 tablet, Rfl: 3   Subjective:   PATIENT ID: Jake Elliott GENDER: male DOB: [birth year], MRN: [id number]  Chief Complaint  Patient presents with   Follow-up    Cough, shortness of breath and wheezing.    HPI  Jake Elliott is a 53 year old male presenting to clinic for follow up.  He is continued on triple therapy which he feels has been helpful. He is using tiotropium one puff bid which he felt helped his symptoms better than two puffs twice daily. Continues to have a cough but this is less pronounced after initiation of Spiriva. Cough continues to be episodic and in bouts/fits. Cough is mostly dry, with occasional white sputum. He reports also using the albuterol inhaler  with some improvement. Continues to deny fever/chills/night sweats. He continues to be somewhat short of breath with exertion, but better than prior. He is here to discuss the result from his dynamic airway CT.  Patient was in the ED in January of 2024 secondary to acute cough. During his ED stay, blood work and viral testing were normal/negative, while a CTA of the chest was notable for a small thrombus in the lower branch of the left pulmonary vein. He was discharged on Eliquis and asked to follow up with pulmonology. He requested to be discontinued off Eliquis during a prior visit and has been off of it. His last visit to the ED was in September of 2024 for chest pain with reassuring workup. He follows with cardiology as well as Dr. Orlie Dakin from oncology for polycythemia.   Patient unfortunately continues to smoke, but less than prior. He has done so for over 15 years. He denies any vaping. He denies any current illicit drug use. 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   CT Chest PE protocol 10/09/2022   Lungs/Pleura: Moderate emphysematous changes are again seen. There are minimal atelectatic changes in both lung bases. There is a 4 mm nodule in the left lower lobe image 9/91 which is  unchanged from prior. No pleural effusion or pneumothorax. Trachea and central airways are patent  Ancillary information including prior medications, full medical/surgical/family/social histories, and PFTs (when available) are listed below and have been reviewed.   Review of Systems  Constitutional:  Negative for chills, fever, malaise/fatigue and weight loss.  Respiratory:  Positive for cough. Negative for hemoptysis, sputum production, shortness of breath and wheezing.   Cardiovascular:  Negative for chest pain.     Objective:   Vitals:   08/11/23 1623  BP: 132/88  Pulse: 93  Temp: 97.6 F (36.4 C)  TempSrc: Temporal  SpO2: 94%  Weight: 246 lb 9.6 oz  (111.9 kg)  Height: 5\' 11"  (1.803 m)   94% on RA  BMI Readings from Last 3 Encounters:  08/11/23 34.39 kg/m  07/14/23 34.23 kg/m  05/20/23 35.04 kg/m   Wt Readings from Last 3 Encounters:  08/11/23 246 lb 9.6 oz (111.9 kg)  07/14/23 245 lb 6.4 oz (111.3 kg)  05/20/23 251 lb 3.2 oz (113.9 kg)    Physical Exam Constitutional:      Appearance: Normal appearance. He is not ill-appearing.  HENT:     Mouth/Throat:     Comments: Would not let me examine  Cardiovascular:     Rate and Rhythm: Normal rate and regular rhythm.     Pulses: Normal pulses.     Heart sounds: Normal heart sounds.  Pulmonary:     Effort: Pulmonary effort is normal.     Breath sounds: Normal breath sounds. No wheezing or rales.  Abdominal:     Palpations: Abdomen is soft.  Musculoskeletal:        General: Normal range of motion.  Neurological:     General: No focal deficit present.     Mental Status: He is alert.       Ancillary Information    Past Medical History:  Diagnosis Date   Angina at rest [facility])    Anxiety    Attention-deficit/hyperactivity disorder    Bipolar disorder (HCC)    COPD (chronic obstructive pulmonary disease) (HCC)    Depression    GI bleed    History of tobacco abuse    Hypertension    Hypertrophic cardiomyopathy (HCC)    s/p myomectomy   Peripheral neuropathy    PTSD (post-traumatic stress disorder)      Family History  Problem Relation Age of Onset   Cancer - Lung Mother    Skin cancer Mother    Alcohol abuse Mother    Drug abuse Mother    Stroke Father    Hypertension Father    Alcohol abuse Father    Drug abuse Father    Drug abuse Sister      Past Surgical History:  Procedure Laterality Date   CARDIAC CATHETERIZATION     11/14/2010   CORONARY ARTERY BYPASS GRAFT     for myomectomy for hypertrophic cardiomyopathy    Social History   Socioeconomic History   Marital status: Single    Spouse name: Not on file   Number of children: Not on  file   Years of education: Not on file   Highest education level: Not on file  Occupational History   Not on file  Tobacco Use   Smoking status: Every Day    Current packs/day: 2.00    Average packs/day: 2.0 packs/day for 34.6 years (69.2 ttl pk-yrs)    Types: Cigarettes    Start date: 12/31/1988   Smokeless tobacco: Never   Tobacco  comments:    1 PPD (currently smoking)  Vaping Use   Vaping status: Never Used  Substance and Sexual Activity   Alcohol use: No    Alcohol/week: 0.0 standard drinks of alcohol   Drug use: No   Sexual activity: Yes    Partners: Female    Birth control/protection: None  Other Topics Concern   Not on file  Social History Narrative   Not on file   Social Drivers of Health   Financial Resource Strain: Not on file  Food Insecurity: Food Insecurity Present (03/23/2023)   Hunger Vital Sign    Worried About Running Out of Food in the Last Year: Sometimes true    Ran Out of Food in the Last Year: Sometimes true  Transportation Needs: No Transportation Needs (03/23/2023)   PRAPARE - Administrator, Civil Service (Medical): No    Lack of Transportation (Non-Medical): No  Physical Activity: Not on file  Stress: Not on file  Social Connections: Not on file  Intimate Partner Violence: Not At Risk (03/23/2023)   Humiliation, Afraid, Rape, and Kick questionnaire    Fear of Current or Ex-Partner: No    Emotionally Abused: No    Physically Abused: No    Sexually Abused: No     No Known Allergies   CBC    Component Value Date/Time   WBC 7.6 03/23/2023 1205   WBC 7.9 03/14/2023 1703   RBC 6.07 (H) 03/23/2023 1205   HGB 18.3 (H) 03/23/2023 1205   HGB CANCELED 07/05/2019 1530   HCT 53.1 (H) 03/23/2023 1205   HCT CANCELED 07/05/2019 1530   PLT 200 03/23/2023 1205   PLT CANCELED 07/05/2019 1530   MCV 87.5 03/23/2023 1205   MCV 82 06/14/2013 2021   MCH 30.1 03/23/2023 1205   MCHC 34.5 03/23/2023 1205   RDW 12.3 03/23/2023 1205   RDW 13.1  06/14/2013 2021   LYMPHSABS 1.6 03/11/2023 0812   LYMPHSABS CANCELED 07/05/2019 1530   LYMPHSABS 1.1 06/14/2013 2021   MONOABS 0.7 03/11/2023 0812   MONOABS 1.0 06/14/2013 2021   EOSABS 0.3 03/11/2023 0812   EOSABS CANCELED 07/05/2019 1530   EOSABS 0.3 06/14/2013 2021   BASOSABS 0.1 03/11/2023 0812   BASOSABS CANCELED 07/05/2019 1530   BASOSABS 0.1 06/14/2013 2021    Pulmonary Functions Testing Results:    Latest Ref Rng & Units 05/06/2023    3:40 PM  PFT Results  FVC-Pre L 4.09   FVC-Predicted Pre % 78   FVC-Post L 4.07   FVC-Predicted Post % 77   Pre FEV1/FVC % % 68   Post FEV1/FCV % % 77   FEV1-Pre L 2.78   FEV1-Predicted Pre % 68   FEV1-Post L 3.12   DLCO uncorrected ml/min/mmHg 22.12   DLCO UNC% % 73   DLVA Predicted % 96   TLC L 6.13   TLC % Predicted % 85   RV % Predicted % 99     Outpatient Medications Prior to Visit  Medication Sig Dispense Refill   albuterol (VENTOLIN HFA) 108 (90 Base) MCG/ACT inhaler Inhale 2 puffs into the lungs every 6 (six) hours as needed for wheezing or shortness of breath. 8 g 11   budesonide-formoterol (SYMBICORT) 160-4.5 MCG/ACT inhaler Inhale 2 puffs into the lungs in the morning and at bedtime. 1 each 5   cyclobenzaprine (FLEXERIL) 10 MG tablet TAKE 1 TABLET BY MOUTH THREE TIMES DAILY AS NEEDED FOR MUSCLE SPASM. THIS CAN MAKE YOU SLEEPY 90 tablet 0  dextroamphetamine (DEXTROSTAT) 10 MG tablet Take 10 mg by mouth daily.     diphenhydrAMINE (BENADRYL) 25 MG tablet Take 3 tablets (75 mg total) by mouth at bedtime as needed. (Patient taking differently: Take 12.5 mg by mouth at bedtime as needed.) 90 tablet 1   metoCLOPramide (REGLAN) 10 MG tablet Take 1 tablet (10 mg total) by mouth every 6 (six) hours as needed. 30 tablet 0   metoprolol tartrate (LOPRESSOR) 25 MG tablet Take 0.5 tablets (12.5 mg total) by mouth at bedtime. 90 tablet 3   nicotine (NICODERM CQ - DOSED IN MG/24 HOURS) 21 mg/24hr patch Place 1 patch (21 mg total) onto the  skin daily. 42 patch 0   Oxycodone HCl 10 MG TABS Take by mouth.     pantoprazole (PROTONIX) 40 MG tablet Take 1 tablet (40 mg total) by mouth 2 (two) times daily. (Patient taking differently: Take 40 mg by mouth 3 times/day as needed-between meals & bedtime.) 180 tablet 1   Spacer/Aero-Holding Chambers (AEROCHAMBER MV) inhaler Use as instructed 1 each 0   sucralfate (CARAFATE) 1 g tablet Take 1 tablet (1 g total) by mouth 4 (four) times daily. 120 tablet 1   tadalafil (CIALIS) 20 MG tablet TAKE 1 TABLET BY MOUTH ONCE DAILY AS NEEDED FOR ERECTILE DYSFUNCTION 30 tablet 5   Tiotropium Bromide Monohydrate (SPIRIVA RESPIMAT) 2.5 MCG/ACT AERS Inhale 2 puffs into the lungs daily. 60 each 11   triamcinolone ointment (KENALOG) 0.5 % Apply 1 Application topically 2 (two) times daily. 30 g 3   verapamil (CALAN) 120 MG tablet Take 1 tablet (120 mg total) by mouth 2 (two) times daily. 180 tablet 3   No facility-administered medications prior to visit.

## 2023-08-19 DIAGNOSIS — M5432 Sciatica, left side: Secondary | ICD-10-CM | POA: Diagnosis not present

## 2023-08-19 DIAGNOSIS — G894 Chronic pain syndrome: Secondary | ICD-10-CM | POA: Diagnosis not present

## 2023-08-19 DIAGNOSIS — M47816 Spondylosis without myelopathy or radiculopathy, lumbar region: Secondary | ICD-10-CM | POA: Diagnosis not present

## 2023-08-19 DIAGNOSIS — Z79891 Long term (current) use of opiate analgesic: Secondary | ICD-10-CM | POA: Diagnosis not present

## 2023-08-21 ENCOUNTER — Other Ambulatory Visit: Payer: Self-pay | Admitting: Nurse Practitioner

## 2023-08-21 DIAGNOSIS — M5030 Other cervical disc degeneration, unspecified cervical region: Secondary | ICD-10-CM

## 2023-08-21 DIAGNOSIS — M47812 Spondylosis without myelopathy or radiculopathy, cervical region: Secondary | ICD-10-CM

## 2023-08-21 DIAGNOSIS — G8929 Other chronic pain: Secondary | ICD-10-CM

## 2023-08-21 DIAGNOSIS — M5416 Radiculopathy, lumbar region: Secondary | ICD-10-CM

## 2023-08-21 DIAGNOSIS — M47816 Spondylosis without myelopathy or radiculopathy, lumbar region: Secondary | ICD-10-CM

## 2023-08-21 DIAGNOSIS — M5412 Radiculopathy, cervical region: Secondary | ICD-10-CM

## 2023-08-23 NOTE — Telephone Encounter (Signed)
 Requested medication (s) are due for refill today: yes  Requested medication (s) are on the active medication list: yes  Last refill:07/23/23  Future visit scheduled: no  Notes to clinic:  med not delegated to NT to RF   Requested Prescriptions  Pending Prescriptions Disp Refills   cyclobenzaprine (FLEXERIL) 10 MG tablet [Pharmacy Med Name: Cyclobenzaprine HCl 10 MG Oral Tablet] 90 tablet 0    Sig: TAKE 1 TABLET BY MOUTH THREE TIMES DAILY AS NEEDED FOR MUSCLE SPASM (THIS  CAN  MAKE  YOU  SLEEPY)     Not Delegated - Analgesics:  Muscle Relaxants Failed - 08/23/2023  4:14 PM      Failed - This refill cannot be delegated      Failed - Valid encounter within last 6 months    Recent Outpatient Visits           7 months ago Lumbar spondylosis   [facility] Berniece Salines, FNP   8 months ago Bipolar affective disorder, currently depressed, mild [facility])   [facility] Berniece Salines, FNP   9 months ago Testosterone deficiency   [facility] Berniece Salines, FNP   10 months ago Lumbar radiculopathy   [facility] Berniece Salines, [place]

## 2023-08-24 NOTE — Telephone Encounter (Signed)
 Please review.  KP

## 2023-08-26 ENCOUNTER — Other Ambulatory Visit: Payer: Self-pay | Admitting: Nurse Practitioner

## 2023-08-26 DIAGNOSIS — M5030 Other cervical disc degeneration, unspecified cervical region: Secondary | ICD-10-CM

## 2023-08-26 DIAGNOSIS — M5412 Radiculopathy, cervical region: Secondary | ICD-10-CM

## 2023-08-26 DIAGNOSIS — M5416 Radiculopathy, lumbar region: Secondary | ICD-10-CM

## 2023-08-26 DIAGNOSIS — M47816 Spondylosis without myelopathy or radiculopathy, lumbar region: Secondary | ICD-10-CM

## 2023-08-26 DIAGNOSIS — M47812 Spondylosis without myelopathy or radiculopathy, cervical region: Secondary | ICD-10-CM

## 2023-08-26 DIAGNOSIS — G8929 Other chronic pain: Secondary | ICD-10-CM

## 2023-09-16 DIAGNOSIS — G894 Chronic pain syndrome: Secondary | ICD-10-CM | POA: Diagnosis not present

## 2023-09-16 DIAGNOSIS — Z79891 Long term (current) use of opiate analgesic: Secondary | ICD-10-CM | POA: Diagnosis not present

## 2023-09-16 DIAGNOSIS — M5432 Sciatica, left side: Secondary | ICD-10-CM | POA: Diagnosis not present

## 2023-09-16 DIAGNOSIS — M47816 Spondylosis without myelopathy or radiculopathy, lumbar region: Secondary | ICD-10-CM | POA: Diagnosis not present

## 2023-09-16 DIAGNOSIS — M5416 Radiculopathy, lumbar region: Secondary | ICD-10-CM | POA: Diagnosis not present

## 2023-09-21 ENCOUNTER — Ambulatory Visit: Admitting: Family Medicine

## 2023-09-25 DIAGNOSIS — Z419 Encounter for procedure for purposes other than remedying health state, unspecified: Secondary | ICD-10-CM | POA: Diagnosis not present

## 2023-09-30 ENCOUNTER — Other Ambulatory Visit: Payer: Self-pay | Admitting: Cardiovascular Disease

## 2023-10-08 ENCOUNTER — Emergency Department
Admission: EM | Admit: 2023-10-08 | Discharge: 2023-10-08 | Disposition: A | Discharge status: Home / Self Care | Attending: Emergency Medicine | Admitting: Emergency Medicine

## 2023-10-08 ENCOUNTER — Emergency Department

## 2023-10-08 ENCOUNTER — Other Ambulatory Visit: Payer: Self-pay

## 2023-10-08 DIAGNOSIS — R0602 Shortness of breath: Secondary | ICD-10-CM | POA: Diagnosis not present

## 2023-10-08 DIAGNOSIS — R059 Cough, unspecified: Secondary | ICD-10-CM | POA: Diagnosis present

## 2023-10-08 DIAGNOSIS — J441 Chronic obstructive pulmonary disease with (acute) exacerbation: Secondary | ICD-10-CM | POA: Diagnosis not present

## 2023-10-08 LAB — CBC
HCT: 44.2 % (ref 39.0–52.0)
Hemoglobin: 15.6 g/dL (ref 13.0–17.0)
MCH: 31.6 pg (ref 26.0–34.0)
MCHC: 35.3 g/dL (ref 30.0–36.0)
MCV: 89.5 fL (ref 80.0–100.0)
Platelets: 170 10*3/uL (ref 150–400)
RBC: 4.94 MIL/uL (ref 4.22–5.81)
RDW: 12.4 % (ref 11.5–15.5)
WBC: 4.9 10*3/uL (ref 4.0–10.5)
nRBC: 0 % (ref 0.0–0.2)

## 2023-10-08 LAB — BASIC METABOLIC PANEL WITH GFR
Anion gap: 10 (ref 5–15)
BUN: 24 mg/dL — ABNORMAL HIGH (ref 6–20)
CO2: 22 mmol/L (ref 22–32)
Calcium: 9.1 mg/dL (ref 8.9–10.3)
Chloride: 109 mmol/L (ref 98–111)
Creatinine, Ser: 1.02 mg/dL (ref 0.61–1.24)
GFR, Estimated: >60 mL/min (ref >60)
Glucose, Bld: 115 mg/dL — ABNORMAL HIGH (ref 70–99)
Potassium: 4.4 mmol/L (ref 3.5–5.1)
Sodium: 141 mmol/L (ref 135–145)

## 2023-10-08 LAB — RESP PANEL BY RT-PCR (RSV, FLU A&B, COVID)  RVPGX2
Influenza A by PCR: NEGATIVE
Influenza B by PCR: NEGATIVE
Resp Syncytial Virus by PCR: NEGATIVE
SARS Coronavirus 2 by RT PCR: NEGATIVE

## 2023-10-08 LAB — TROPONIN I (HIGH SENSITIVITY): Troponin I (High Sensitivity): 11 ng/L (ref <18)

## 2023-10-08 MED ORDER — PREDNISONE 10 MG (21) PO TBPK: ORAL_TABLET | ORAL | 0 refills | Status: DC

## 2023-10-08 MED ORDER — PROMETHAZINE-DM 6.25-15 MG/5ML PO SYRP: 2.5000 mL | ORAL_SOLUTION | Freq: Four times a day (QID) | ORAL | 0 refills | Status: DC | PRN

## 2023-10-08 MED ORDER — IPRATROPIUM-ALBUTEROL 0.5-2.5 (3) MG/3ML IN SOLN
3.0000 mL | Freq: Once | RESPIRATORY_TRACT | Status: AC
  Administered 2023-10-08: 3 mL via RESPIRATORY_TRACT
  Filled 2023-10-08: qty 3

## 2023-10-08 MED ORDER — DEXAMETHASONE SODIUM PHOSPHATE 10 MG/ML IJ SOLN
10.0000 mg | Freq: Once | INTRAMUSCULAR | Status: AC
  Administered 2023-10-08: 10 mg via INTRAMUSCULAR
  Filled 2023-10-08: qty 1

## 2023-10-08 MED ORDER — AMOXICILLIN-POT CLAVULANATE 875-125 MG PO TABS: 1.0000 | ORAL_TABLET | Freq: Two times a day (BID) | ORAL | 0 refills | Status: AC

## 2023-10-08 NOTE — ED Triage Notes (Signed)
 Pt to ED via POV from home. Pt reports cough that has been present since last year and has been getting worse. Pt reports hx of COPD. Pt reports SOB. Pt denies CP.

## 2023-10-08 NOTE — Discharge Instructions (Signed)
 Please follow-up with your pulmonologist or primary doctor for recheck in the next few days.  Return here for new or worse symptoms.

## 2023-10-08 NOTE — ED Provider Notes (Signed)
 [place] [facility] AT [facility] Provider Note   CSN: [id number] Arrival date & time: 10/08/23  1718     History  Chief Complaint  Patient presents with   Cough    Jake Elliott is a 53 y.o. male.  Patient here for cough.  He has had a chronic cough for about 1 year has been worse over the past 4 days.  No fevers or chills.  Some shortness of breath mostly when coughing.  No actual chest pain.  Does admit to history of COPD has been using his Spiriva  Symbicort  and albuterol  without improvement.   Cough Associated symptoms: shortness of breath and wheezing   Associated symptoms: no chest pain, no chills, no fever and no headaches        Home Medications Prior to Admission medications   Medication Sig Start Date End Date Taking? Authorizing Provider  amoxicillin -clavulanate (AUGMENTIN ) 875-125 MG tablet Take 1 tablet by mouth 2 (two) times daily for 10 days. 10/08/23 10/18/23 Yes Araiyah Cumpton, PA-C  predniSONE  (STERAPRED UNI-PAK 21 TAB) 10 MG (21) TBPK tablet Take as directed 10/08/23  Yes Kasha Howeth, PA-C  promethazine -dextromethorphan (PROMETHAZINE -DM) 6.25-15 MG/5ML syrup Take 2.5 mLs by mouth 4 (four) times daily as needed for cough. 10/08/23  Yes Hollie Luria, PA-C  albuterol  (VENTOLIN  HFA) 108 (90 Base) MCG/ACT inhaler Inhale 2 puffs into the lungs every 6 (six) hours as needed for wheezing or shortness of breath. 05/20/23   Marc Senior, MD  budesonide -formoterol  (SYMBICORT ) 160-4.5 MCG/ACT inhaler Inhale 2 puffs into the lungs in the morning and at bedtime. 05/20/23   Marc Senior, MD  cyclobenzaprine  (FLEXERIL ) 10 MG tablet TAKE 1 TABLET BY MOUTH THREE TIMES DAILY AS NEEDED FOR MUSCLE SPASM (THIS  CAN  MAKE  YOU  SLEEPY) 08/31/23   Quinton Buckler, FNP  dextroamphetamine  (DEXTROSTAT ) 10 MG tablet Take 10 mg by mouth daily. 03/18/23   [provider]  diphenhydrAMINE  (BENADRYL ) 25 MG tablet Take 3 tablets (75 mg total) by mouth at  bedtime as needed. Patient taking differently: Take 12.5 mg by mouth at bedtime as needed. 01/11/17   Hamp Levine, MD  metoCLOPramide  (REGLAN ) 10 MG tablet Take 1 tablet (10 mg total) by mouth every 6 (six) hours as needed. 03/14/23   Jacquie Maudlin, MD  metoprolol  tartrate (LOPRESSOR ) 25 MG tablet Take 0.5 tablets (12.5 mg total) by mouth at bedtime. 06/24/23   Gollan, Timothy J, MD  Oxycodone  HCl 10 MG TABS Take by mouth.    [provider]  pantoprazole  (PROTONIX ) 40 MG tablet Take 1 tablet (40 mg total) by mouth 2 (two) times daily. Patient taking differently: Take 40 mg by mouth 3 times/day as needed-between meals & bedtime. 10/27/22   Quinton Buckler, FNP  Spacer/Aero-Holding Chambers (AEROCHAMBER MV) inhaler Use as instructed 05/07/23   Vergia Glasgow, MD  sucralfate  (CARAFATE ) 1 g tablet Take 1 tablet (1 g total) by mouth 4 (four) times daily. 03/14/23   Jacquie Maudlin, MD  tadalafil  (CIALIS ) 20 MG tablet TAKE 1 TABLET BY MOUTH ONCE DAILY AS NEEDED FOR ERECTILE DYSFUNCTION 10/01/23   Gollan, Timothy J, MD  Tiotropium Bromide Monohydrate  (SPIRIVA  RESPIMAT) 2.5 MCG/ACT AERS Inhale 2 puffs into the lungs daily. 07/14/23   Vergia Glasgow, MD  triamcinolone  ointment (KENALOG ) 0.5 % Apply 1 Application topically 2 (two) times daily. 12/09/22   Quinton Buckler, FNP  verapamil  (CALAN ) 120 MG tablet Take 1 tablet (120 mg total) by mouth 2 (two) times  daily. 03/23/23   Gollan, Timothy J, MD      Allergies    Patient has no known allergies.    Review of Systems   Review of Systems  Constitutional:  Negative for chills and fever.  Respiratory:  Positive for cough, shortness of breath and wheezing.   Cardiovascular:  Negative for chest pain.  Gastrointestinal:  Negative for abdominal pain.  Neurological:  Negative for weakness, numbness and headaches.    Physical Exam Updated Vital Signs BP (!) 130/90   Pulse 82   Temp 98.2 F (36.8 C) (Oral)   Resp 20   Ht 5\' 11"  (1.803 m)   Wt  113.4 kg   SpO2 98%   BMI 34.87 kg/m  Physical Exam Vitals reviewed.  Constitutional:      General: He is not in acute distress.    Appearance: Normal appearance. He is not toxic-appearing.  HENT:     Head: Normocephalic and atraumatic.     Nose: Nose normal.  Cardiovascular:     Pulses: Normal pulses.  Pulmonary:     Effort: Pulmonary effort is normal.     Breath sounds: Wheezing present.  Abdominal:     Tenderness: There is no abdominal tenderness.  Musculoskeletal:     Cervical back: Normal range of motion.  Neurological:     Mental Status: He is alert and oriented to person, place, and time. Mental status is at baseline.  Psychiatric:        Mood and Affect: Mood normal.        Behavior: Behavior normal.     ED Results / Procedures / Treatments   Labs (all labs ordered are listed, but only abnormal results are displayed) Labs Reviewed  BASIC METABOLIC PANEL WITH GFR - Abnormal; Notable for the following components:      Result Value   Glucose, Bld 115 (*)    BUN 24 (*)    All other components within normal limits  RESP PANEL BY RT-PCR (RSV, FLU A&B, COVID)  [id number]  CBC  TROPONIN I (HIGH SENSITIVITY)    EKG None  Radiology DG Chest 2 View Result Date: 10/08/2023 CLINICAL DATA:  Shortness of breath EXAM: CHEST - 2 VIEW COMPARISON:  03/14/2023 and older FINDINGS: Postop chest. Normal cardiopericardial silhouette. No consolidation, pneumothorax or effusion. No edema. Mild interstitial prominence is seen previously. Air-fluid level along the stomach beneath the left hemidiaphragm. IMPRESSION: Postop chest. Chronic lung changes. No acute cardiopulmonary disease Electronically Signed   By: Adrianna Horde M.D.   On: 10/08/2023 18:23    Procedures Procedures    Medications Ordered in ED Medications  ipratropium-albuterol  (DUONEB) 0.5-2.5 (3) MG/3ML nebulizer solution 3 mL (3 mLs Nebulization Given 10/08/23 1820)  dexamethasone  (DECADRON ) injection 10 mg (10 mg  Intramuscular Given 10/08/23 1819)    ED Course/ Medical Decision Making/ A&P Clinical Course as of 10/08/23 2017  Fri Oct 08, 2023  1950 Troponin I (High Sensitivity) [SR]    Clinical Course User Index [SR] Hollie Luria, [place]                                 Medical Decision Making Patient with a history of COPD and CABG here for cough he is hemodynamically stable nonhypoxic mild wheezing on exam.  Will give breathing treatment and steroids empirically.  Troponin nonelevated at 11.  Does have nonspecific EKG abnormalities with a history of CABG.  No actual anginal  chest pain and has been going on for quite some time now.  I did discuss with attending Dr. Linnell Richardson who recommends discontinuing second troponin not likely to be ACS.  Chest x-ray is clear and labs are overall unremarkable.  Patient with mild improvement after breathing treatment.  I will recommend Parikh treatment for suspected COPD exacerbation.  He will follow-up with pulmonology.  Amount and/or Complexity of Data Reviewed Labs: ordered. Decision-making details documented in ED Course. Radiology: ordered.  Risk Prescription drug management.           Final Clinical Impression(s) / ED Diagnoses Final diagnoses:  COPD exacerbation (HCC)    Rx / DC Orders ED Discharge Orders          Ordered    amoxicillin -clavulanate (AUGMENTIN ) 875-125 MG tablet  2 times daily        10/08/23 2014    predniSONE  (STERAPRED UNI-PAK 21 TAB) 10 MG (21) TBPK tablet        10/08/23 2014    promethazine -dextromethorphan (PROMETHAZINE -DM) 6.25-15 MG/5ML syrup  4 times daily PRN        10/08/23 2014              Hollie Luria, PA-C 10/08/23 2017    Bryson Carbine, MD 10/08/23 2235

## 2023-10-11 ENCOUNTER — Other Ambulatory Visit: Payer: Self-pay | Admitting: *Deleted

## 2023-10-11 ENCOUNTER — Telehealth: Payer: Self-pay | Admitting: Cardiovascular Disease

## 2023-10-11 DIAGNOSIS — D751 Secondary polycythemia: Secondary | ICD-10-CM

## 2023-10-11 NOTE — Telephone Encounter (Signed)
 Pt states that he had to go to ED and would like for Dr. Gollan to take a look at EKG that was done to determine if he needs a f/u appt. Please advise

## 2023-10-12 ENCOUNTER — Inpatient Hospital Stay: Payer: Medicaid Other | Attending: Oncology

## 2023-10-12 ENCOUNTER — Encounter: Payer: Self-pay | Admitting: Oncology

## 2023-10-12 ENCOUNTER — Inpatient Hospital Stay (HOSPITAL_BASED_OUTPATIENT_CLINIC_OR_DEPARTMENT_OTHER): Payer: Medicaid Other | Admitting: Oncology

## 2023-10-12 VITALS — BP 148/90 | HR 66 | Temp 98.4°F | Resp 17 | Wt 244.0 lb

## 2023-10-12 DIAGNOSIS — I10 Essential (primary) hypertension: Secondary | ICD-10-CM | POA: Insufficient documentation

## 2023-10-12 DIAGNOSIS — Z808 Family history of malignant neoplasm of other organs or systems: Secondary | ICD-10-CM | POA: Diagnosis not present

## 2023-10-12 DIAGNOSIS — Z801 Family history of malignant neoplasm of trachea, bronchus and lung: Secondary | ICD-10-CM | POA: Insufficient documentation

## 2023-10-12 DIAGNOSIS — D751 Secondary polycythemia: Secondary | ICD-10-CM

## 2023-10-12 DIAGNOSIS — Z7952 Long term (current) use of systemic steroids: Secondary | ICD-10-CM | POA: Diagnosis not present

## 2023-10-12 DIAGNOSIS — F17210 Nicotine dependence, cigarettes, uncomplicated: Secondary | ICD-10-CM | POA: Insufficient documentation

## 2023-10-12 DIAGNOSIS — R059 Cough, unspecified: Secondary | ICD-10-CM | POA: Diagnosis not present

## 2023-10-12 LAB — CBC WITH DIFFERENTIAL/PLATELET
Abs Immature Granulocytes: 0.14 10*3/uL — ABNORMAL HIGH (ref 0.00–0.07)
Basophils Absolute: 0 10*3/uL (ref 0.0–0.1)
Basophils Relative: 0 %
Eosinophils Absolute: 0 10*3/uL (ref 0.0–0.5)
Eosinophils Relative: 0 %
HCT: 44.9 % (ref 39.0–52.0)
Hemoglobin: 15.5 g/dL (ref 13.0–17.0)
Immature Granulocytes: 1 %
Lymphocytes Relative: 13 %
Lymphs Abs: 1.6 10*3/uL (ref 0.7–4.0)
MCH: 30.7 pg (ref 26.0–34.0)
MCHC: 34.5 g/dL (ref 30.0–36.0)
MCV: 88.9 fL (ref 80.0–100.0)
Monocytes Absolute: 1.3 10*3/uL — ABNORMAL HIGH (ref 0.1–1.0)
Monocytes Relative: 11 %
Neutro Abs: 8.8 10*3/uL — ABNORMAL HIGH (ref 1.7–7.7)
Neutrophils Relative %: 75 %
Platelets: 194 10*3/uL (ref 150–400)
RBC: 5.05 MIL/uL (ref 4.22–5.81)
RDW: 12.2 % (ref 11.5–15.5)
WBC: 11.9 10*3/uL — ABNORMAL HIGH (ref 4.0–10.5)
nRBC: 0 % (ref 0.0–0.2)

## 2023-10-12 NOTE — Progress Notes (Signed)
 [facility]  [phone] [phone]) [phone]  ID: Jake Elliott OB: [birth year]  MR#: [id number]  VOZ#:[id number]  Patient Care Team: Quinton Buckler, FNP as PCP - General (Nurse Practitioner) Devorah Fonder, MD as PCP - Cardiology (Cardiology) Devorah Fonder, MD as Consulting Physician (Cardiology)  CHIEF COMPLAINT: Secondary polycythemia.  INTERVAL HISTORY: Patient returns to clinic today for repeat lab work, further evaluation, and consideration of phlebotomy.  He has significant cough, shortness of breath, wheezing, and chest congestion and is actively taking prednisone , antibiotics, inhalers for the symptoms.  He has an increased amount of fatigue, but reports his primary care physician has discontinued his testosterone .  He denies any fevers.  He has no neurologic complaints.  He has a good appetite and denies weight loss.  He denies chest pain or hemoptysis.  He denies any nausea, vomiting, constipation, or diarrhea.  He has no urinary complaints.  Patient offers no further specific complaints today.   REVIEW OF SYSTEMS:   Review of Systems  Constitutional:  Positive for malaise/fatigue. Negative for fever and weight loss.  HENT:  Positive for congestion.   Respiratory:  Positive for cough, shortness of breath and wheezing. Negative for hemoptysis.   Cardiovascular: Negative.  Negative for chest pain and leg swelling.  Gastrointestinal:  Negative for abdominal pain.  Genitourinary: Negative.  Negative for dysuria.  Musculoskeletal: Negative.  Negative for back pain.  Skin: Negative.  Negative for rash.  Neurological: Negative.  Negative for dizziness, focal weakness, weakness and headaches.  Psychiatric/Behavioral: Negative.  The patient is not nervous/anxious.     As per HPI. Otherwise, a complete review of systems is negative.  PAST MEDICAL HISTORY: Past Medical History:  Diagnosis Date   Angina at rest [facility])    Anxiety     Attention-deficit/hyperactivity disorder    Bipolar disorder (HCC)    COPD (chronic obstructive pulmonary disease) (HCC)    Depression    GI bleed    History of tobacco abuse    Hypertension    Hypertrophic cardiomyopathy (HCC)    s/p myomectomy   Peripheral neuropathy    PTSD (post-traumatic stress disorder)     PAST SURGICAL HISTORY: Past Surgical History:  Procedure Laterality Date   CARDIAC CATHETERIZATION     11/14/2010   CORONARY ARTERY BYPASS GRAFT     for myomectomy for hypertrophic cardiomyopathy    FAMILY HISTORY: Family History  Problem Relation Age of Onset   Cancer - Lung Mother    Skin cancer Mother    Alcohol abuse Mother    Drug abuse Mother    Stroke Father    Hypertension Father    Alcohol abuse Father    Drug abuse Father    Drug abuse Sister     ADVANCED DIRECTIVES (Y/N):  N  HEALTH MAINTENANCE: Social History   Tobacco Use   Smoking status: Every Day    Current packs/day: 2.00    Average packs/day: 2.0 packs/day for 34.8 years (69.6 ttl pk-yrs)    Types: Cigarettes    Start date: 12/31/1988   Smokeless tobacco: Never   Tobacco comments:    1 PPD (currently smoking)  Vaping Use   Vaping status: Never Used  Substance Use Topics   Alcohol use: No    Alcohol/week: 0.0 standard drinks of alcohol   Drug use: No     Colonoscopy:  PAP:  Bone density:  Lipid panel:  No Known Allergies  Current Outpatient Medications  Medication Sig Dispense Refill  albuterol  (VENTOLIN  HFA) 108 (90 Base) MCG/ACT inhaler Inhale 2 puffs into the lungs every 6 (six) hours as needed for wheezing or shortness of breath. 8 g 11   amoxicillin -clavulanate (AUGMENTIN ) 875-125 MG tablet Take 1 tablet by mouth 2 (two) times daily for 10 days. 20 tablet 0   budesonide -formoterol  (SYMBICORT ) 160-4.5 MCG/ACT inhaler Inhale 2 puffs into the lungs in the morning and at bedtime. 1 each 5   cyclobenzaprine  (FLEXERIL ) 10 MG tablet TAKE 1 TABLET BY MOUTH THREE TIMES DAILY  AS NEEDED FOR MUSCLE SPASM (THIS  CAN  MAKE  YOU  SLEEPY) 90 tablet 1   dextroamphetamine  (DEXTROSTAT ) 10 MG tablet Take 10 mg by mouth daily.     diphenhydrAMINE  (BENADRYL ) 25 MG tablet Take 3 tablets (75 mg total) by mouth at bedtime as needed. (Patient taking differently: Take 12.5 mg by mouth at bedtime as needed.) 90 tablet 1   metoCLOPramide  (REGLAN ) 10 MG tablet Take 1 tablet (10 mg total) by mouth every 6 (six) hours as needed. 30 tablet 0   metoprolol  tartrate (LOPRESSOR ) 25 MG tablet Take 0.5 tablets (12.5 mg total) by mouth at bedtime. 90 tablet 3   Oxycodone  HCl 10 MG TABS Take by mouth.     pantoprazole  (PROTONIX ) 40 MG tablet Take 1 tablet (40 mg total) by mouth 2 (two) times daily. (Patient taking differently: Take 40 mg by mouth 3 times/day as needed-between meals & bedtime.) 180 tablet 1   predniSONE  (STERAPRED UNI-PAK 21 TAB) 10 MG (21) TBPK tablet Take as directed 21 tablet 0   promethazine -dextromethorphan (PROMETHAZINE -DM) 6.25-15 MG/5ML syrup Take 2.5 mLs by mouth 4 (four) times daily as needed for cough. 118 mL 0   Spacer/Aero-Holding Chambers (AEROCHAMBER MV) inhaler Use as instructed 1 each 0   sucralfate  (CARAFATE ) 1 g tablet Take 1 tablet (1 g total) by mouth 4 (four) times daily. 120 tablet 1   tadalafil  (CIALIS ) 20 MG tablet TAKE 1 TABLET BY MOUTH ONCE DAILY AS NEEDED FOR ERECTILE DYSFUNCTION 30 tablet 1   Tiotropium Bromide Monohydrate  (SPIRIVA  RESPIMAT) 2.5 MCG/ACT AERS Inhale 2 puffs into the lungs daily. 60 each 11   triamcinolone  ointment (KENALOG ) 0.5 % Apply 1 Application topically 2 (two) times daily. 30 g 3   verapamil  (CALAN ) 120 MG tablet Take 1 tablet (120 mg total) by mouth 2 (two) times daily. 180 tablet 3   No current facility-administered medications for this visit.    OBJECTIVE: Vitals:   10/12/23 1441  BP: (!) 148/90  Pulse: 66  Resp: 17  Temp: 98.4 F (36.9 C)  SpO2: 95%     Body mass index is 34.03 kg/m.    ECOG FS:1 - Symptomatic but  completely ambulatory  General: Well-developed, well-nourished, no acute distress. Eyes: Pink conjunctiva, anicteric sclera. HEENT: Normocephalic, moist mucous membranes. Lungs: No audible wheezing or coughing. Heart: Regular rate and rhythm. Abdomen: Soft, nontender, no obvious distention. Musculoskeletal: No edema, cyanosis, or clubbing. Neuro: Alert, answering all questions appropriately. Cranial nerves grossly intact. Skin: No rashes or petechiae noted. Psych: Normal affect.  LAB RESULTS:  Lab Results  Component Value Date   NA 141 10/08/2023   K 4.4 10/08/2023   CL 109 10/08/2023   CO2 22 10/08/2023   GLUCOSE 115 (H) 10/08/2023   BUN 24 (H) 10/08/2023   CREATININE 1.02 10/08/2023   CALCIUM  9.1 10/08/2023   PROT 6.8 10/30/2022   ALBUMIN 4.2 10/30/2022   AST 23 10/30/2022   ALT 21 10/30/2022   ALKPHOS 63  10/30/2022   BILITOT 0.8 10/30/2022   GFRNONAA >60 10/08/2023   GFRAA >60 03/09/2020    Lab Results  Component Value Date   WBC 11.9 (H) 10/12/2023   NEUTROABS 8.8 (H) 10/12/2023   HGB 15.5 10/12/2023   HCT 44.9 10/12/2023   MCV 88.9 10/12/2023   PLT 194 10/12/2023     STUDIES: DG Chest 2 View Result Date: 10/08/2023 CLINICAL DATA:  Shortness of breath EXAM: CHEST - 2 VIEW COMPARISON:  03/14/2023 and older FINDINGS: Postop chest. Normal cardiopericardial silhouette. No consolidation, pneumothorax or effusion. No edema. Mild interstitial prominence is seen previously. Air-fluid level along the stomach beneath the left hemidiaphragm. IMPRESSION: Postop chest. Chronic lung changes. No acute cardiopulmonary disease Electronically Signed   By: Adrianna Horde M.D.   On: 10/08/2023 18:23    ASSESSMENT: Secondary polycythemia.  PLAN:    Secondary polycythemia: Likely due to to testosterone  use as well as tobacco use.  Patient's most recent hemoglobin is 15.5, but he reports he has discontinued testosterone .  Previously, he also had an elevated carbon monoxide level,  but the remainder of his laboratory work including iron stores, erythropoietin  levels, and JAK2 mutation with reflex are either negative or within normal limits.  Patient does not require phlebotomy at this time.  If he reinitiates testosterone , please refer patient back for consideration of phlebotomy.  No follow-up has been scheduled at this time.  Low testosterone : Okay from a hematology standpoint to reinitiate testosterone .  Cough/congestion: Continue antibiotics, prednisone , and inhalers.  Follow-up with primary care as indicated. Hypertension: Chronic and unchanged.  Patient's blood pressure is moderately elevated today.  Continue monitoring and treatment per primary care.   Patient expressed understanding and was in agreement with this plan. He also understands that He can call clinic at any time with any questions, concerns, or complaints.    Shellie Dials, MD   10/12/2023 2:51 PM

## 2023-10-12 NOTE — Progress Notes (Signed)
 Patient states '' He feel as if his lungs are on fire." Patient states this has been going on the past couple weeks. Patient has a cough which he says has been going on over the last few weeks as well.

## 2023-10-14 DIAGNOSIS — G894 Chronic pain syndrome: Secondary | ICD-10-CM | POA: Diagnosis not present

## 2023-10-14 DIAGNOSIS — M5416 Radiculopathy, lumbar region: Secondary | ICD-10-CM | POA: Diagnosis not present

## 2023-10-14 DIAGNOSIS — M5432 Sciatica, left side: Secondary | ICD-10-CM | POA: Diagnosis not present

## 2023-10-14 DIAGNOSIS — Z79891 Long term (current) use of opiate analgesic: Secondary | ICD-10-CM | POA: Diagnosis not present

## 2023-10-14 DIAGNOSIS — M47816 Spondylosis without myelopathy or radiculopathy, lumbar region: Secondary | ICD-10-CM | POA: Diagnosis not present

## 2023-10-18 NOTE — Telephone Encounter (Signed)
 Called patient, unable to leave voicemail

## 2023-10-19 NOTE — Telephone Encounter (Signed)
 See MyChart encounter

## 2023-10-25 ENCOUNTER — Other Ambulatory Visit: Payer: Self-pay | Admitting: Cardiovascular Disease

## 2023-10-25 ENCOUNTER — Telehealth: Payer: Self-pay | Admitting: Cardiovascular Disease

## 2023-10-25 DIAGNOSIS — Z419 Encounter for procedure for purposes other than remedying health state, unspecified: Secondary | ICD-10-CM | POA: Diagnosis not present

## 2023-10-25 NOTE — Telephone Encounter (Signed)
*  STAT* If patient is at the pharmacy, call can be transferred to refill team.   1. Which medications need to be refilled? (please list name of each medication and dose if known) metoprolol  tartrate (LOPRESSOR ) 25 MG tablet   2. Which pharmacy/location (including street and city if local pharmacy) is medication to be sent to?  [facility] [address], [place] - [address]      3. Do they need a 30 day or 90 day supply? 90 day    Pt is out of medication and needs a new prescription in due to him taking a whole tablet now instead of a half.

## 2023-10-25 NOTE — Telephone Encounter (Signed)
 Please address.

## 2023-10-26 NOTE — Telephone Encounter (Signed)
Pt returning nurse call. Please advise.

## 2023-10-26 NOTE — Telephone Encounter (Signed)
Called patient, no answer and unable to leave message. °

## 2023-10-28 DIAGNOSIS — F900 Attention-deficit hyperactivity disorder, predominantly inattentive type: Secondary | ICD-10-CM | POA: Diagnosis not present

## 2023-10-28 DIAGNOSIS — F1111 Opioid abuse, in remission: Secondary | ICD-10-CM | POA: Diagnosis not present

## 2023-10-28 DIAGNOSIS — F4312 Post-traumatic stress disorder, chronic: Secondary | ICD-10-CM | POA: Diagnosis not present

## 2023-10-28 DIAGNOSIS — F1411 Cocaine abuse, in remission: Secondary | ICD-10-CM | POA: Diagnosis not present

## 2023-10-28 DIAGNOSIS — F0634 Mood disorder due to known physiological condition with mixed features: Secondary | ICD-10-CM | POA: Diagnosis not present

## 2023-10-28 DIAGNOSIS — Z62819 Personal history of unspecified abuse in childhood: Secondary | ICD-10-CM | POA: Diagnosis not present

## 2023-10-28 DIAGNOSIS — Z599 Problem related to housing and economic circumstances, unspecified: Secondary | ICD-10-CM | POA: Diagnosis not present

## 2023-10-28 NOTE — Telephone Encounter (Signed)
 Called patient pharmacy to verify refills remaining - tech said that she could have refill ready for patient in about an hour  Called patient - unable to leave voicemail - will send [facility]

## 2023-10-30 ENCOUNTER — Other Ambulatory Visit: Payer: Self-pay | Admitting: Cardiovascular Disease

## 2023-10-30 ENCOUNTER — Other Ambulatory Visit: Payer: Self-pay | Admitting: Nurse Practitioner

## 2023-10-30 DIAGNOSIS — G8929 Other chronic pain: Secondary | ICD-10-CM

## 2023-10-30 DIAGNOSIS — M5416 Radiculopathy, lumbar region: Secondary | ICD-10-CM

## 2023-10-30 DIAGNOSIS — M5030 Other cervical disc degeneration, unspecified cervical region: Secondary | ICD-10-CM

## 2023-10-30 DIAGNOSIS — M47816 Spondylosis without myelopathy or radiculopathy, lumbar region: Secondary | ICD-10-CM

## 2023-10-30 DIAGNOSIS — M47812 Spondylosis without myelopathy or radiculopathy, cervical region: Secondary | ICD-10-CM

## 2023-10-30 DIAGNOSIS — M5412 Radiculopathy, cervical region: Secondary | ICD-10-CM

## 2023-11-02 NOTE — Telephone Encounter (Signed)
 Requested medications are due for refill today.  yes  Requested medications are on the active medications list.  yes  Last refill. 08/31/2023  #90 1 rf  Future visit scheduled.   yes  Notes to clinic.  Refill not delegated.    Requested Prescriptions  Pending Prescriptions Disp Refills   cyclobenzaprine  (FLEXERIL ) 10 MG tablet [Pharmacy Med Name: Cyclobenzaprine  HCl 10 MG Oral Tablet] 90 tablet 0    Sig: TAKE 1 TABLET BY MOUTH THREE TIMES DAILY AS NEEDED FOR MUSCLE SPASM (  THIS  CAN  MAKE  YOU  SLEEPY)     Not Delegated - Analgesics:  Muscle Relaxants Failed - 11/02/2023 12:42 PM      Failed - This refill cannot be delegated      Failed - Valid encounter within last 6 months    Recent Outpatient Visits   None     Future Appointments             In 2 weeks Morey Ar, NP [facility]

## 2023-11-03 ENCOUNTER — Telehealth: Payer: Self-pay | Admitting: Cardiovascular Disease

## 2023-11-03 NOTE — Telephone Encounter (Signed)
 Pt c/o medication issue:  1. Name of Medication: rosuvastatin   2. How are you currently taking this medication (dosage and times per day)? 1 a day  3. Are you having a reaction (difficulty breathing--STAT)? No  4. What is your medication issue? Pt calling in for refill stating that it was denied but not on current med list. He said has still been taking it

## 2023-11-03 NOTE — Telephone Encounter (Signed)
 Looks like the Rosuvastatin  10 mg was discontinued at ED visit in 2024-    No mention of continuing in last OV note. Should he continue with this?   Will route to NP who seen patient last.   Thanks!

## 2023-11-04 NOTE — Telephone Encounter (Signed)
 I attempted to contact patient, unable to leave a message.   Will try again later.

## 2023-11-05 ENCOUNTER — Other Ambulatory Visit: Payer: Self-pay

## 2023-11-05 DIAGNOSIS — E782 Mixed hyperlipidemia: Secondary | ICD-10-CM

## 2023-11-05 NOTE — Telephone Encounter (Signed)
Attempted to contact patient, unable to leave a message.

## 2023-11-05 NOTE — Telephone Encounter (Signed)
 Patient returned call, made aware to have blood work below completed.   Patient aware to be fasting, no appointment needed.   Lab work ordered.   Thanks!

## 2023-11-10 ENCOUNTER — Other Ambulatory Visit
Admission: RE | Admit: 2023-11-10 | Discharge: 2023-11-10 | Disposition: A | Discharge status: Home / Self Care | Source: Ambulatory Visit | Attending: Student | Admitting: Student

## 2023-11-10 DIAGNOSIS — E782 Mixed hyperlipidemia: Secondary | ICD-10-CM | POA: Insufficient documentation

## 2023-11-10 LAB — COMPREHENSIVE METABOLIC PANEL WITH GFR
ALT: 23 U/L (ref 0–44)
AST: 19 U/L (ref 15–41)
Albumin: 4 g/dL (ref 3.5–5.0)
Alkaline Phosphatase: 58 U/L (ref 38–126)
Anion gap: 7 (ref 5–15)
BUN: 23 mg/dL — ABNORMAL HIGH (ref 6–20)
CO2: 24 mmol/L (ref 22–32)
Calcium: 9.2 mg/dL (ref 8.9–10.3)
Chloride: 107 mmol/L (ref 98–111)
Creatinine, Ser: 0.98 mg/dL (ref 0.61–1.24)
GFR, Estimated: >60 mL/min (ref >60)
Glucose, Bld: 101 mg/dL — ABNORMAL HIGH (ref 70–99)
Potassium: 3.9 mmol/L (ref 3.5–5.1)
Sodium: 138 mmol/L (ref 135–145)
Total Bilirubin: 0.6 mg/dL (ref 0.0–1.2)
Total Protein: 6.3 g/dL — ABNORMAL LOW (ref 6.5–8.1)

## 2023-11-10 LAB — LIPID PANEL
Cholesterol: 229 mg/dL — ABNORMAL HIGH (ref 0–200)
HDL: 42 mg/dL (ref >40)
LDL Cholesterol: 137 mg/dL — ABNORMAL HIGH (ref 0–99)
Total CHOL/HDL Ratio: 5.5 ratio
Triglycerides: 251 mg/dL — ABNORMAL HIGH (ref <150)
VLDL: 50 mg/dL — ABNORMAL HIGH (ref 0–40)

## 2023-11-10 NOTE — Addendum Note (Signed)
 Addended by: Dala Dublin on: 11/10/2023 08:52 AM   Modules accepted: Orders

## 2023-11-11 DIAGNOSIS — M5432 Sciatica, left side: Secondary | ICD-10-CM | POA: Diagnosis not present

## 2023-11-11 DIAGNOSIS — M5416 Radiculopathy, lumbar region: Secondary | ICD-10-CM | POA: Diagnosis not present

## 2023-11-11 DIAGNOSIS — Z79891 Long term (current) use of opiate analgesic: Secondary | ICD-10-CM | POA: Diagnosis not present

## 2023-11-11 DIAGNOSIS — G894 Chronic pain syndrome: Secondary | ICD-10-CM | POA: Diagnosis not present

## 2023-11-11 DIAGNOSIS — M5431 Sciatica, right side: Secondary | ICD-10-CM | POA: Diagnosis not present

## 2023-11-11 DIAGNOSIS — M5030 Other cervical disc degeneration, unspecified cervical region: Secondary | ICD-10-CM | POA: Diagnosis not present
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
## 2023-11-16 ENCOUNTER — Encounter: Payer: Self-pay | Admitting: Student

## 2023-11-16 ENCOUNTER — Ambulatory Visit

## 2023-11-16 ENCOUNTER — Ambulatory Visit: Attending: Student | Admitting: Student

## 2023-11-16 ENCOUNTER — Ambulatory Visit: Admitting: Family Medicine

## 2023-11-16 ENCOUNTER — Telehealth: Payer: Self-pay | Admitting: Student

## 2023-11-16 VITALS — BP 150/90 | HR 85 | Resp 19 | Ht 71.0 in | Wt 237.0 lb

## 2023-11-16 DIAGNOSIS — R0789 Other chest pain: Secondary | ICD-10-CM | POA: Insufficient documentation

## 2023-11-16 DIAGNOSIS — I421 Obstructive hypertrophic cardiomyopathy: Secondary | ICD-10-CM | POA: Diagnosis not present

## 2023-11-16 DIAGNOSIS — I2510 Atherosclerotic heart disease of native coronary artery without angina pectoris: Secondary | ICD-10-CM | POA: Diagnosis not present

## 2023-11-16 DIAGNOSIS — I10 Essential (primary) hypertension: Secondary | ICD-10-CM | POA: Insufficient documentation

## 2023-11-16 DIAGNOSIS — R0609 Other forms of dyspnea: Secondary | ICD-10-CM | POA: Diagnosis not present

## 2023-11-16 DIAGNOSIS — R002 Palpitations: Secondary | ICD-10-CM | POA: Insufficient documentation

## 2023-11-16 DIAGNOSIS — F17200 Nicotine dependence, unspecified, uncomplicated: Secondary | ICD-10-CM | POA: Diagnosis not present

## 2023-11-16 DIAGNOSIS — E785 Hyperlipidemia, unspecified: Secondary | ICD-10-CM | POA: Diagnosis not present

## 2023-11-16 DIAGNOSIS — R238 Other skin changes: Secondary | ICD-10-CM

## 2023-11-16 MED ORDER — ROSUVASTATIN CALCIUM 10 MG PO TABS: 10.0000 mg | ORAL_TABLET | Freq: Every day | ORAL | 3 refills | Status: AC

## 2023-11-16 NOTE — Telephone Encounter (Signed)
 Order for Referral to Vascular Surgery placed with authorization from Morey Ar, NP.

## 2023-11-16 NOTE — Telephone Encounter (Signed)
 Patient was seen by DW earlier today. Patient needs referral put in for [facility] [address] so we can schedule appointment.  Thank you.

## 2023-11-16 NOTE — Patient Instructions (Signed)
 Medication Instructions:  Your physician recommends the following medication changes.  START TAKING: Rosuvastatin  10 mg once daily  *If you need a refill on your cardiac medications before your next appointment, please call your pharmacy*  Lab Work: None ordered at this time  If you have labs (blood work) drawn today and your tests are completely normal, you will receive your results only by: MyChart Message (if you have MyChart) OR A paper copy in the mail If you have any lab test that is abnormal or we need to change your treatment, we will call you to review the results.  Testing/Procedures:  Your physician has recommended that you wear a Zio monitor.   This monitor is a medical device that records the heart's electrical activity. Doctors most often use these monitors to diagnose arrhythmias. Arrhythmias are problems with the speed or rhythm of the heartbeat. The monitor is a small device applied to your chest. You can wear one while you do your normal daily activities. While wearing this monitor if you have any symptoms to push the button and record what you felt. Once you have worn this monitor for the period of time provider prescribed (7 days), you will return the monitor device in the postage paid box. Once it is returned they will download the data collected and provide us  with a report which the provider will then review and we will call you with those results. Important tips:  Avoid showering during the first 24 hours of wearing the monitor. Avoid excessive sweating to help maximize wear time. Do not submerge the device, no hot tubs, and no swimming pools. Keep any lotions or oils away from the patch. After 24 hours you may shower with the patch on. Take brief showers with your back facing the shower head.  Do not remove patch once it has been placed because that will interrupt data and decrease adhesive wear time. Push the button when you have any symptoms and write down what  you were feeling. Once you have completed wearing your monitor, remove and place into box which has postage paid and place in your outgoing mailbox.  If for some reason you have misplaced your box then call our office and we can provide another box and/or mail it off for you.   Follow-Up: At [facility], you and your health needs are our priority.  As part of our continuing mission to provide you with exceptional heart care, our providers are all part of one team.  This team includes your primary Cardiologist (physician) and Advanced Practice Providers or APPs (Physician Assistants and Nurse Practitioners) who all work together to provide you with the care you need, when you need it.  Your next appointment:   3 - 4 month(s)  Provider:   Belva Boyden, MD    We recommend signing up for the patient portal called "MyChart".  Sign up information is provided on this After Visit Summary.  MyChart is used to connect with patients for Virtual Visits (Telemedicine).  Patients are able to view lab/test results, encounter notes, upcoming appointments, etc.  Non-urgent messages can be sent to your provider as well.   To learn more about what you can do with MyChart, go to [email or web address].

## 2023-11-17 ENCOUNTER — Ambulatory Visit: Payer: Self-pay | Admitting: Student

## 2023-11-24 ENCOUNTER — Ambulatory Visit: Admitting: Family Medicine

## 2023-11-25 DIAGNOSIS — Z419 Encounter for procedure for purposes other than remedying health state, unspecified: Secondary | ICD-10-CM | POA: Diagnosis not present

## 2023-11-29 ENCOUNTER — Other Ambulatory Visit: Payer: Self-pay | Admitting: Pulmonary Disease

## 2023-12-02 ENCOUNTER — Telehealth: Payer: Self-pay | Admitting: Cardiovascular Disease

## 2023-12-02 NOTE — Telephone Encounter (Signed)
 Pt requesting a referral be sent to urology. Please advise

## 2023-12-03 ENCOUNTER — Other Ambulatory Visit: Payer: Self-pay | Admitting: Nurse Practitioner

## 2023-12-03 DIAGNOSIS — E349 Endocrine disorder, unspecified: Secondary | ICD-10-CM

## 2023-12-09 DIAGNOSIS — G894 Chronic pain syndrome: Secondary | ICD-10-CM | POA: Diagnosis not present

## 2023-12-09 DIAGNOSIS — M5416 Radiculopathy, lumbar region: Secondary | ICD-10-CM | POA: Diagnosis not present

## 2023-12-09 DIAGNOSIS — M5431 Sciatica, right side: Secondary | ICD-10-CM | POA: Diagnosis not present

## 2023-12-09 DIAGNOSIS — M5432 Sciatica, left side: Secondary | ICD-10-CM | POA: Diagnosis not present

## 2023-12-09 DIAGNOSIS — M5030 Other cervical disc degeneration, unspecified cervical region: Secondary | ICD-10-CM | POA: Diagnosis not present

## 2023-12-09 DIAGNOSIS — Z79891 Long term (current) use of opiate analgesic: Secondary | ICD-10-CM | POA: Diagnosis not present

## 2023-12-14 ENCOUNTER — Telehealth: Payer: Self-pay

## 2023-12-14 NOTE — Telephone Encounter (Signed)
 Patient left message stating he would like to have lab work done

## 2023-12-25 DIAGNOSIS — Z419 Encounter for procedure for purposes other than remedying health state, unspecified: Secondary | ICD-10-CM | POA: Diagnosis not present

## 2023-12-27 ENCOUNTER — Other Ambulatory Visit: Payer: Self-pay | Admitting: Cardiovascular Disease

## 2023-12-27 NOTE — Telephone Encounter (Signed)
 Pt's pharmacy is requesting a refill on non cardiac medication tadalafil . Would Dr. Gollan like to refill this non cardiac medication? Please address

## 2024-01-03 ENCOUNTER — Other Ambulatory Visit (INDEPENDENT_AMBULATORY_CARE_PROVIDER_SITE_OTHER): Payer: Self-pay | Admitting: Nurse Practitioner

## 2024-01-03 DIAGNOSIS — R238 Other skin changes: Secondary | ICD-10-CM

## 2024-01-05 ENCOUNTER — Encounter (INDEPENDENT_AMBULATORY_CARE_PROVIDER_SITE_OTHER): Payer: Self-pay | Admitting: Nurse Practitioner

## 2024-01-05 ENCOUNTER — Ambulatory Visit (INDEPENDENT_AMBULATORY_CARE_PROVIDER_SITE_OTHER): Admitting: Nurse Practitioner

## 2024-01-05 ENCOUNTER — Ambulatory Visit (INDEPENDENT_AMBULATORY_CARE_PROVIDER_SITE_OTHER)

## 2024-01-05 VITALS — BP 141/85 | HR 73 | Ht 71.0 in | Wt 232.0 lb

## 2024-01-05 DIAGNOSIS — I8311 Varicose veins of right lower extremity with inflammation: Secondary | ICD-10-CM

## 2024-01-05 DIAGNOSIS — I10 Essential (primary) hypertension: Secondary | ICD-10-CM | POA: Diagnosis not present

## 2024-01-05 DIAGNOSIS — R609 Edema, unspecified: Secondary | ICD-10-CM | POA: Diagnosis not present

## 2024-01-05 DIAGNOSIS — I8312 Varicose veins of left lower extremity with inflammation: Secondary | ICD-10-CM | POA: Diagnosis not present

## 2024-01-05 DIAGNOSIS — R238 Other skin changes: Secondary | ICD-10-CM

## 2024-01-05 NOTE — Progress Notes (Signed)
 Subjective:    Patient ID: Jake Elliott, male    DOB: [birth year], 53 y.o.   MRN: [id number] Chief Complaint  Patient presents with   np. ABI + consult. leg color changes    The patient is a 53 year old male who presents today for concerns for color changes in his lower extremities.  He notes that he has had Stepheni Cameron discoloration on his legs for about 20 years or so.  He notes that he has swelling of his lower extremities that is off and on and has also occurred for many years.  He notes that he has also had multiple instances of cellulitis.  He notes that he utilizes medical grade compression although he could be more consistent, given the pain that it causes in his feet from his neuropathy.  He denies any claudication-like symptoms.  He denies any history of DVT but notes that he believes he had a pulmonary embolism 1 time.  He was on Eliquis  but had to stop due to GI bleed.  Today the patient has an ABI of 1.25 on the right and 1.24 on the left.  He has normal TBI's bilaterally.  He has strong triphasic tibial artery waveforms with normal toe waveforms bilaterally.  This is consistent with the previous studies he had done in 2023.  He does have some notable areas of varicosities bilaterally.  He also has a varicosity on his right lower extremity which has had multiple episodes of bleeding and has required emergency attention.    Review of Systems  Cardiovascular:  Positive for leg swelling.  Skin:  Positive for color change.  All other systems reviewed and are negative.      Objective:   Physical Exam Vitals reviewed.  HENT:     Head: Normocephalic.  Cardiovascular:     Rate and Rhythm: Normal rate.     Pulses:          Dorsalis pedis pulses are detected w/ Doppler on the right side and detected w/ Doppler on the left side.       Posterior tibial pulses are detected w/ Doppler on the right side and detected w/ Doppler on the left side.  Pulmonary:     Effort: Pulmonary  effort is normal.  Musculoskeletal:     Right lower leg: Edema present.     Left lower leg: Edema present.  Skin:    General: Skin is warm and dry.  Neurological:     Mental Status: He is alert and oriented to person, place, and time.  Psychiatric:        Mood and Affect: Mood normal.        Behavior: Behavior normal.        Thought Content: Thought content normal.        Judgment: Judgment normal.     BP (!) 141/85   Pulse 73   Ht 5' 11 (1.803 m)   Wt 232 lb (105.2 kg)   BMI 32.36 kg/m   Past Medical History:  Diagnosis Date   Angina at rest [facility])    Anxiety    Attention-deficit/hyperactivity disorder    Bipolar disorder (HCC)    COPD (chronic obstructive pulmonary disease) (HCC)    Depression    GI bleed    History of tobacco abuse    Hypertension    Hypertrophic cardiomyopathy (HCC)    s/p myomectomy   Peripheral neuropathy    PTSD (post-traumatic stress disorder)     Social History  Socioeconomic History   Marital status: Single    Spouse name: Not on file   Number of children: Not on file   Years of education: Not on file   Highest education level: Not on file  Occupational History   Not on file  Tobacco Use   Smoking status: Every Day    Current packs/day: 2.00    Average packs/day: 2.0 packs/day for 35.0 years (70.0 ttl pk-yrs)    Types: Cigarettes    Start date: 12/31/1988   Smokeless tobacco: Never   Tobacco comments:    1 PPD (currently smoking)  Vaping Use   Vaping status: Never Used  Substance and Sexual Activity   Alcohol use: No    Alcohol/week: 0.0 standard drinks of alcohol   Drug use: No   Sexual activity: Yes    Partners: Female    Birth control/protection: None  Other Topics Concern   Not on file  Social History Narrative   Not on file   Social Drivers of Health   Financial Resource Strain: Not on file  Food Insecurity: Food Insecurity Present (03/23/2023)   Hunger Vital Sign    Worried About Running Out of Food in the  Last Year: Sometimes true    Ran Out of Food in the Last Year: Sometimes true  Transportation Needs: No Transportation Needs (03/23/2023)   PRAPARE - Administrator, Civil Service (Medical): No    Lack of Transportation (Non-Medical): No  Physical Activity: Not on file  Stress: Not on file  Social Connections: Not on file  Intimate Partner Violence: Not At Risk (03/23/2023)   Humiliation, Afraid, Rape, and Kick questionnaire    Fear of Current or Ex-Partner: No    Emotionally Abused: No    Physically Abused: No    Sexually Abused: No    Past Surgical History:  Procedure Laterality Date   CARDIAC CATHETERIZATION     11/14/2010   CORONARY ARTERY BYPASS GRAFT     for myomectomy for hypertrophic cardiomyopathy    Family History  Problem Relation Age of Onset   Cancer - Lung Mother    Skin cancer Mother    Alcohol abuse Mother    Drug abuse Mother    Stroke Father    Hypertension Father    Alcohol abuse Father    Drug abuse Father    Drug abuse Sister     No Known Allergies     Latest Ref Rng & Units 10/12/2023    2:19 PM 10/08/2023    5:27 PM 03/23/2023   12:05 PM  CBC  WBC 4.0 - 10.5 K/uL 11.9  4.9  7.6   Hemoglobin 13.0 - 17.0 g/dL 84.4  84.3  81.6   Hematocrit 39.0 - 52.0 % 44.9  44.2  53.1   Platelets 150 - 400 K/uL 194  170  200       CMP     Component Value Date/Time   NA 138 11/10/2023 0910   NA 138 07/05/2019 1530   NA 134 (L) 06/14/2013 2021   K 3.9 11/10/2023 0910   K 3.4 (L) 06/14/2013 2021   CL 107 11/10/2023 0910   CL 102 06/14/2013 2021   CO2 24 11/10/2023 0910   CO2 24 06/14/2013 2021   GLUCOSE 101 (H) 11/10/2023 0910   GLUCOSE 103 (H) 06/14/2013 2021   BUN 23 (H) 11/10/2023 0910   BUN 21 07/05/2019 1530   BUN 10 06/14/2013 2021   CREATININE 0.98 11/10/2023 0910  CREATININE 0.92 06/14/2013 2021   CALCIUM  9.2 11/10/2023 0910   CALCIUM  8.8 06/14/2013 2021   PROT 6.3 (L) 11/10/2023 0910   PROT 6.8 07/05/2019 1530   PROT 7.4  06/14/2013 2021   ALBUMIN 4.0 11/10/2023 0910   ALBUMIN 4.5 07/05/2019 1530   ALBUMIN 3.6 06/14/2013 2021   AST 19 11/10/2023 0910   AST 14 (L) 06/14/2013 2021   ALT 23 11/10/2023 0910   ALT 14 06/14/2013 2021   ALKPHOS 58 11/10/2023 0910   ALKPHOS 91 06/14/2013 2021   BILITOT 0.6 11/10/2023 0910   BILITOT 0.8 07/05/2019 1530   BILITOT 1.2 (H) 06/14/2013 2021   GFR 71.50 01/22/2023 1000   GFRNONAA >60 11/10/2023 0910   GFRNONAA >60 06/14/2013 2021     No results found.     Assessment & Plan:   1. Varicose veins of both lower extremities with inflammation (Primary) The patient's discoloration today is not related to peripheral arterial disease, however he certainly does have stasis dermatitis.  He has notable varicosities and has had bleeding from varicosities as well that required intervention.  Based on this it would be prudent to have him return for bilateral venous reflux to evaluate for underlying venous insufficiency.  He is advised to continue to engage with use of medical grade compression stockings.  They should be utilized daily.  He should also elevate his lower extremities and continue with activity daily.  2. Essential hypertension Continue antihypertensive medications as already ordered, these medications have been reviewed and there are no changes at this time.  3. Edema, unspecified type As noted above the patient should continue with conservative therapies, the patient may also be dealing with lymphedema.  Depending on the noninvasive studies a lymphedema pump may be beneficial for the patient.   Current Outpatient Medications on File Prior to Visit  Medication Sig Dispense Refill   albuterol  (VENTOLIN  HFA) 108 (90 Base) MCG/ACT inhaler Inhale 2 puffs into the lungs every 6 (six) hours as needed for wheezing or shortness of breath. 8 g 11   Aspirin-Salicylamide-Caffeine (ARTHRITIS STRENGTH BC POWDER PO) Take 1,000 mg by mouth 3 (three) times daily.      cyclobenzaprine  (FLEXERIL ) 10 MG tablet TAKE 1 TABLET BY MOUTH THREE TIMES DAILY AS NEEDED FOR MUSCLE SPASM (THIS  CAN  MAKE  YOU  SLEEPY) 90 tablet 1   dextroamphetamine  (DEXTROSTAT ) 10 MG tablet Take 10 mg by mouth daily.     diphenhydrAMINE  (BENADRYL ) 25 MG tablet Take 3 tablets (75 mg total) by mouth at bedtime as needed. (Patient taking differently: Take 12.5 mg by mouth at bedtime as needed.) 90 tablet 1   ibuprofen  (ADVIL ) 600 MG tablet Take 600 mg by mouth every 6 (six) hours as needed for moderate pain (pain score 4-6).     Oxycodone  HCl 10 MG TABS Take by mouth.     pantoprazole  (PROTONIX ) 40 MG tablet Take 1 tablet (40 mg total) by mouth 2 (two) times daily. 180 tablet 1   rosuvastatin  (CRESTOR ) 10 MG tablet Take 1 tablet (10 mg total) by mouth daily. 90 tablet 3   Spacer/Aero-Holding Chambers (AEROCHAMBER MV) inhaler Use as instructed 1 each 0   SYMBICORT  160-4.5 MCG/ACT inhaler INHALE 2 PUFFS BY MOUTH IN THE MORNING AND 2 AT BEDTIME 11 g 11   tadalafil  (CIALIS ) 20 MG tablet TAKE 1 TABLET BY MOUTH ONCE DAILY AS NEEDED FOR ERECTILE DYSFUNCTION 30 tablet 0   Tiotropium Bromide Monohydrate  (SPIRIVA  RESPIMAT) 2.5 MCG/ACT AERS Inhale 2 puffs  into the lungs daily. 60 each 11   triamcinolone  ointment (KENALOG ) 0.5 % Apply 1 Application topically 2 (two) times daily. 30 g 3   verapamil  (CALAN ) 120 MG tablet Take 1 tablet (120 mg total) by mouth 2 (two) times daily. 180 tablet 3   metoCLOPramide  (REGLAN ) 10 MG tablet Take 1 tablet (10 mg total) by mouth every 6 (six) hours as needed. (Patient not taking: Reported on 01/05/2024) 30 tablet 0   metoprolol  tartrate (LOPRESSOR ) 25 MG tablet Take 0.5 tablets (12.5 mg total) by mouth at bedtime. (Patient not taking: Reported on 11/16/2023) 90 tablet 3   promethazine -dextromethorphan (PROMETHAZINE -DM) 6.25-15 MG/5ML syrup Take 2.5 mLs by mouth 4 (four) times daily as needed for cough. 118 mL 0   sucralfate  (CARAFATE ) 1 g tablet Take 1 tablet (1 g total) by  mouth 4 (four) times daily. (Patient not taking: Reported on 11/16/2023) 120 tablet 1   No current facility-administered medications on file prior to visit.    There are no Patient Instructions on file for this visit. No follow-ups on file.   [place] Perra E Kacen Mellinger, NP

## 2024-01-06 ENCOUNTER — Encounter (INDEPENDENT_AMBULATORY_CARE_PROVIDER_SITE_OTHER): Payer: Self-pay

## 2024-01-06 DIAGNOSIS — M5030 Other cervical disc degeneration, unspecified cervical region: Secondary | ICD-10-CM | POA: Diagnosis not present

## 2024-01-06 DIAGNOSIS — G894 Chronic pain syndrome: Secondary | ICD-10-CM | POA: Diagnosis not present

## 2024-01-06 DIAGNOSIS — M5432 Sciatica, left side: Secondary | ICD-10-CM | POA: Diagnosis not present

## 2024-01-06 DIAGNOSIS — M5416 Radiculopathy, lumbar region: Secondary | ICD-10-CM | POA: Diagnosis not present

## 2024-01-06 DIAGNOSIS — M5431 Sciatica, right side: Secondary | ICD-10-CM | POA: Diagnosis not present

## 2024-01-06 DIAGNOSIS — Z79891 Long term (current) use of opiate analgesic: Secondary | ICD-10-CM | POA: Diagnosis not present

## 2024-01-10 LAB — VAS US ABI WITH/WO TBI
Left ABI: 1.24
Right ABI: 1.25

## 2024-01-11 ENCOUNTER — Ambulatory Visit (INDEPENDENT_AMBULATORY_CARE_PROVIDER_SITE_OTHER): Admitting: Family Medicine

## 2024-01-11 ENCOUNTER — Encounter: Payer: Self-pay | Admitting: Family Medicine

## 2024-01-11 VITALS — BP 145/84 | HR 69

## 2024-01-11 DIAGNOSIS — I872 Venous insufficiency (chronic) (peripheral): Secondary | ICD-10-CM | POA: Diagnosis not present

## 2024-01-11 DIAGNOSIS — I421 Obstructive hypertrophic cardiomyopathy: Secondary | ICD-10-CM | POA: Diagnosis not present

## 2024-01-11 DIAGNOSIS — M5412 Radiculopathy, cervical region: Secondary | ICD-10-CM | POA: Diagnosis not present

## 2024-01-11 DIAGNOSIS — Z5320 Procedure and treatment not carried out because of patient's decision for unspecified reasons: Secondary | ICD-10-CM | POA: Diagnosis not present

## 2024-01-11 DIAGNOSIS — J432 Centrilobular emphysema: Secondary | ICD-10-CM

## 2024-01-11 DIAGNOSIS — I739 Peripheral vascular disease, unspecified: Secondary | ICD-10-CM

## 2024-01-11 DIAGNOSIS — Z122 Encounter for screening for malignant neoplasm of respiratory organs: Secondary | ICD-10-CM

## 2024-01-11 DIAGNOSIS — G6289 Other specified polyneuropathies: Secondary | ICD-10-CM

## 2024-01-11 DIAGNOSIS — Z9889 Other specified postprocedural states: Secondary | ICD-10-CM

## 2024-01-11 DIAGNOSIS — R7989 Other specified abnormal findings of blood chemistry: Secondary | ICD-10-CM

## 2024-01-11 DIAGNOSIS — F319 Bipolar disorder, unspecified: Secondary | ICD-10-CM | POA: Diagnosis not present

## 2024-01-11 DIAGNOSIS — F900 Attention-deficit hyperactivity disorder, predominantly inattentive type: Secondary | ICD-10-CM | POA: Diagnosis not present

## 2024-01-11 DIAGNOSIS — E349 Endocrine disorder, unspecified: Secondary | ICD-10-CM

## 2024-01-11 DIAGNOSIS — Z7689 Persons encountering health services in other specified circumstances: Secondary | ICD-10-CM

## 2024-01-11 DIAGNOSIS — G894 Chronic pain syndrome: Secondary | ICD-10-CM

## 2024-01-11 DIAGNOSIS — R944 Abnormal results of kidney function studies: Secondary | ICD-10-CM

## 2024-01-11 DIAGNOSIS — Z1159 Encounter for screening for other viral diseases: Secondary | ICD-10-CM

## 2024-01-11 NOTE — Progress Notes (Signed)
 New patient visit   Patient: Jake Elliott   DOB: 1971-01-21   53 y.o. Male  MRN: [id number] Visit Date: 01/11/2024  Today's healthcare provider: LAURAINE LOISE BUOY, DO   Chief Complaint  Patient presents with   New Patient (Initial Visit)    Patient is present to establish care with no concerns to address. Declined updating any vaccines if any were needed   Subjective    Jake Elliott is a 53 y.o. male who presents today as a new patient to establish care.   HPI HPI     New Patient (Initial Visit)    Additional comments: Patient is present to establish care with no concerns to address. Declined updating any vaccines if any were needed      Last edited by Lilian Fitzpatrick, CMA on 01/11/2024  2:42 PM.      Jake Elliott is a 53 year old male with hypertrophic obstructive cardiomyopathy and COPD who presents for follow-up of multiple chronic conditions.  He has a history of hypertrophic obstructive cardiomyopathy and underwent open-heart surgery in 2008 to remove a piece of the septal wall. He has experienced multiple myocardial infarctions, with the most recent occurring during a COVID-19 infection, which also elevated his troponin levels. He has a history of elevated red blood cell count, previously managed by discontinuing testosterone  therapy.  He has COPD and asthma, diagnosed after a persistent cough and a small clot in his left pulmonary vein were identified. He uses multiple inhalers, including Spiriva , Albuterol , and Symbicort , and experiences dyspnea with minimal exertion. He also has a persistent cough, which he attributes to a combination of his medications and conditions.  He reports significant leg pain, described as 'feels like my legs are made of rocks,' and has been evaluated for vascular issues. He recently underwent ankle-brachial indices testing, which showed high systolic pressure in his right toe. He reports constant leg pain rather than  intermittent claudication and clarifies that he has had a pulmonary embolism.  He has a history of gastroesophageal reflux disease, which has led to significant weight loss due to reduced food intake. He takes Protonix  as needed and has adjusted his diet to avoid triggering symptoms. He reports a recent weight loss of approximately 16 pounds over two months.  He has been smoking since age 73 and currently smokes about a pack a day. He acknowledges the impact of smoking on his health but expresses no interest in quitting.  He experiences sleep disturbances and uses Benadryl  to aid sleep, taking two to four tablets at night. He has previously used a CPAP for sleep apnea but no longer uses it, as he reports no longer snoring and sleeping flat on his back.  He takes multiple medications for his various conditions, including Flexeril  for back pain, rosuvastatin  for hyperlipidemia, and verapamil  for cardiac issues. He has stopped metoprolol  due to worsening cough and a history of second-degree heart block when combined with verapamil . He also takes dextroamphetamine  in the morning, prescribed by his psychiatrist.  He has a history of neuropathy and reports random episodes of arrhythmia. He has not had a colonoscopy and expresses reluctance to undergo certain screenings, though he is open to lung cancer screening.      See Pain Management - Dr. Chapman in [place] -> rx oxycodone  - started seeing end of June 2025  Seeing Joen Banter, NP, for psych -> dextroamphetamine       Moderate mixed calcific atherosclerosis on CTA GI Bleed 09/25/22  Past Medical History:  Diagnosis Date   Angina at rest [facility])    Anxiety    Arthritis    Asthma    Attention-deficit/hyperactivity disorder    Bipolar disorder (HCC)    COPD (chronic obstructive pulmonary disease) (HCC)    Depression    GERD (gastroesophageal reflux disease)    GI bleed    Heart murmur    History of tobacco abuse    Hypertension     Hypertrophic cardiomyopathy (HCC)    s/p myomectomy   Peripheral neuropathy    PTSD (post-traumatic stress disorder)    Past Surgical History:  Procedure Laterality Date   CARDIAC CATHETERIZATION     11/14/2010   COLON SURGERY     CORONARY ARTERY BYPASS GRAFT     for myomectomy for hypertrophic cardiomyopathy   Family Status  Relation Name Status   Mother  Deceased   Father  Alive       MVP   Sister  Alive   Brother  Alive   Sister  Alive  No partnership data on file   Family History  Problem Relation Age of Onset   Cancer - Lung Mother    Skin cancer Mother    Alcohol abuse Mother    Drug abuse Mother    Stroke Father    Hypertension Father    Alcohol abuse Father    Drug abuse Father    Drug abuse Sister    Social History   Socioeconomic History   Marital status: Single    Spouse name: Not on file   Number of children: Not on file   Years of education: Not on file   Highest education level: Not on file  Occupational History   Not on file  Tobacco Use   Smoking status: Every Day    Current packs/day: 2.00    Average packs/day: 2.0 packs/day for 35.0 years (70.1 ttl pk-yrs)    Types: Cigarettes    Start date: 12/31/1988   Smokeless tobacco: Never   Tobacco comments:    1 PPD (currently smoking)  Vaping Use   Vaping status: Never Used  Substance and Sexual Activity   Alcohol use: No    Alcohol/week: 0.0 standard drinks of alcohol   Drug use: No   Sexual activity: Yes    Partners: Female    Birth control/protection: None  Other Topics Concern   Not on file  Social History Narrative   Not on file   Social Drivers of Health   Financial Resource Strain: Not on file  Food Insecurity: Food Insecurity Present (03/23/2023)   Hunger Vital Sign    Worried About Running Out of Food in the Last Year: Sometimes true    Ran Out of Food in the Last Year: Sometimes true  Transportation Needs: No Transportation Needs (03/23/2023)   PRAPARE - Therapist, art (Medical): No    Lack of Transportation (Non-Medical): No  Physical Activity: Not on file  Stress: Not on file  Social Connections: Not on file   Outpatient Medications Prior to Visit  Medication Sig   albuterol  (VENTOLIN  HFA) 108 (90 Base) MCG/ACT inhaler Inhale 2 puffs into the lungs every 6 (six) hours as needed for wheezing or shortness of breath.   Aspirin-Salicylamide-Caffeine (ARTHRITIS STRENGTH BC POWDER PO) Take 1,000 mg by mouth 3 (three) times daily.   cyclobenzaprine  (FLEXERIL ) 10 MG tablet TAKE 1 TABLET BY MOUTH THREE TIMES DAILY AS NEEDED FOR MUSCLE SPASM (THIS  CAN  MAKE  YOU  SLEEPY)   dextroamphetamine  (DEXTROSTAT ) 10 MG tablet Take 10 mg by mouth daily.   diphenhydrAMINE  (BENADRYL ) 25 MG tablet Take 3 tablets (75 mg total) by mouth at bedtime as needed. (Patient taking differently: Take 50-100 mg by mouth at bedtime as needed.)   ibuprofen  (ADVIL ) 600 MG tablet Take 600 mg by mouth every 6 (six) hours as needed for moderate pain (pain score 4-6).   metoCLOPramide  (REGLAN ) 10 MG tablet Take 1 tablet (10 mg total) by mouth every 6 (six) hours as needed.   Oxycodone  HCl 10 MG TABS Take by mouth.   pantoprazole  (PROTONIX ) 40 MG tablet Take 1 tablet (40 mg total) by mouth 2 (two) times daily.   rosuvastatin  (CRESTOR ) 10 MG tablet Take 1 tablet (10 mg total) by mouth daily.   Spacer/Aero-Holding Chambers (AEROCHAMBER MV) inhaler Use as instructed   SYMBICORT  160-4.5 MCG/ACT inhaler INHALE 2 PUFFS BY MOUTH IN THE MORNING AND 2 AT BEDTIME   tadalafil  (CIALIS ) 20 MG tablet TAKE 1 TABLET BY MOUTH ONCE DAILY AS NEEDED FOR ERECTILE DYSFUNCTION   Tiotropium Bromide Monohydrate  (SPIRIVA  RESPIMAT) 2.5 MCG/ACT AERS Inhale 2 puffs into the lungs daily.   triamcinolone  ointment (KENALOG ) 0.5 % Apply 1 Application topically 2 (two) times daily.   verapamil  (CALAN ) 120 MG tablet Take 1 tablet (120 mg total) by mouth 2 (two) times daily.   [DISCONTINUED] metoprolol   tartrate (LOPRESSOR ) 25 MG tablet Take 0.5 tablets (12.5 mg total) by mouth at bedtime. (Patient not taking: Reported on 11/16/2023)   [DISCONTINUED] promethazine -dextromethorphan (PROMETHAZINE -DM) 6.25-15 MG/5ML syrup Take 2.5 mLs by mouth 4 (four) times daily as needed for cough.   [DISCONTINUED] sucralfate  (CARAFATE ) 1 g tablet Take 1 tablet (1 g total) by mouth 4 (four) times daily. (Patient not taking: Reported on 11/16/2023)   No facility-administered medications prior to visit.   No Known Allergies   There is no immunization history on file for this patient.  Health Maintenance  Topic Date Due   COVID-19 Vaccine (1) Never done   Pneumococcal Vaccine: 19-49 Years (1 of 2 - PCV) Never done   Pneumococcal Vaccine: 50+ Years (1 of 2 - PCV) Never done   Hepatitis B Vaccines (1 of 3 - 19+ 3-dose series) Never done   Zoster Vaccines- Shingrix (1 of 2) Never done   Colonoscopy  Never done   INFLUENZA VACCINE  01/14/2024   Lung Cancer Screening  07/22/2024   Hepatitis C Screening  Completed   HIV Screening  Completed   HPV VACCINES  Aged Out   Meningococcal B Vaccine  Aged Out   DTaP/Tdap/Td  Discontinued    Patient Care Team: Eureka Valdes, Lauraine SAILOR, DO as PCP - General (Family Medicine) Perla Evalene PARAS, MD as PCP - Cardiology (Cardiology) Perla Evalene PARAS, MD as Consulting Physician (Cardiology) Jacobo Evalene PARAS, MD as Consulting Physician (Hematology and Oncology)       Objective    BP (!) 145/84 (BP Location: Right Arm, Patient Position: Sitting, Cuff Size: Large)   Pulse 69   SpO2 98%     Physical Exam Vitals and nursing note reviewed.  Constitutional:      General: He is not in acute distress.    Appearance: Normal appearance.  HENT:     Head: Normocephalic and atraumatic.  Eyes:     General: No scleral icterus.    Conjunctiva/sclera: Conjunctivae normal.  Cardiovascular:     Rate and Rhythm: Normal rate.  Pulmonary:  Effort: Pulmonary effort is normal.   Skin:    Comments: Nicotine  staining to finger nails; Scattered redness with scattered scabs to BLE; areas are Not warm to touch.  Neurological:     Mental Status: He is alert and oriented to person, place, and time. Mental status is at baseline.  Psychiatric:        Mood and Affect: Mood normal.        Behavior: Behavior normal.     Depression Screen    01/11/2024    2:41 PM 03/23/2023   11:42 AM 12/09/2022    2:27 PM 11/13/2022   11:54 AM  PHQ 2/9 Scores  PHQ - 2 Score 3 6 6 6   PHQ- 9 Score   24 24   No results found for any visits on 01/11/24.  Assessment & Plan     Centrilobular emphysema (HCC)  Establishing care with new doctor, encounter for  Decreased glomerular filtration rate (GFR) -     Basic metabolic panel with GFR  Hypertrophic obstructive cardiomyopathy (HCC)  History of open heart surgery  Chronic pain syndrome  Chronic venous insufficiency  Claudication in peripheral vascular disease (HCC)  Cervical radiculopathy  Other polyneuropathy -     Vitamin B12 -     VITAMIN D  25 Hydroxy (Vit-D Deficiency, Fractures)  Hypotestosteronism -     Testosterone ,Free and Total -     CBC  Elevated TSH -     TSH Rfx on Abnormal to Free T4  Attention deficit hyperactivity disorder (ADHD), predominantly inattentive type  Encounter for screening for lung cancer -     Ambulatory Referral for Lung Cancer Scre  Encounter for hepatitis C screening test for low risk patient -     HCV Ab w Reflex to Quant PCR  Colon cancer screening declined  Bipolar affective disorder, remission status unspecified (HCC) Assessment & Plan: No acute concerns.  Continue to monitor. Follows with psychiatry; defer to specialist management.       Hypertrophic obstructive cardiomyopathy, post-surgical Post-surgical intervention in 2008. Patient has experienced a small clot in the left pulmonary vein (now resolved) and chronic leg pain attributed to arterial and venous  insufficiency. - Follows with cardiology; defer to specialist management  Coronary artery disease, nonobstructive Nonobstructive coronary artery disease with no recent chest pain or acute coronary symptoms. - Continue rosuvastatin  for lipid management. - Follows with cardiology; defer to specialist management  Pulmonary hypertension Pulmonary hypertension, possible consideration of tadalafil  for management.  Centrilobular emphysema Centrilobular emphysema with ongoing cough and dyspnea on exertion, exacerbated by smoking. - Continue Spiriva , Symbicort , and albuterol  for management. - Follows with pulmonology; defer to specialist management  Nicotine  dependence, current daily smoker Current smoker, approximately one pack per day since age 76, with no interest in quitting despite risks.  Chronic venous insufficiency with lower extremity edema and stasis changes; claudication in peripheral vascular disease Stasis changes and edema in lower extremities with blisters and redness, no infection or cellulitis. - Follows with vascular surgery and cardiology; defer to specialist management  Peripheral arterial disease with lower extremity pain and abnormal ABI Abnormal ABI and constant leg pain, recently evaluated by vascular specialist. - Follows with vascular surgery and cardiology; defer to specialist management  Chronic pain, multifactorial (back, neck, legs, post-trauma) Pain in back, neck, and legs, exacerbated by past trauma, significantly impacting daily activities. - Continue Flexeril  and oxycodone  for management. - Follows with pain management; defer to specialist management  Hypogonadism, considering testosterone  replacement Considering restarting testosterone   gel, requires monitoring of red blood cell count due to polycythemia risk. - Order blood work including testosterone  level. - Consider restarting testosterone  gel with regular blood donation to manage red blood cell  count.  Decreased glomerular filtration rate Recheck kidney function today.  Gastroesophageal reflux disease (GERD) Chronic, stable.  GERD with recent weight loss due to dietary changes, uses pantoprazole  as needed.  Hyperlipidemia Chronic, stable.  Managed with rosuvastatin .  Essential hypertension Chronic, stable.  Recent blood pressure readings elevated.  Bipolar affective disorder with depression; ADHD Managed by psychiatrist Joen Ohara, currently taking dextroamphetamine  and using Benadryl  for sleep.  Hypothyroidism, on levothyroxine  Currently taking levothyroxine  50 mcg daily. - Order TSH level.     Return in about 5 weeks (around 02/15/2024).     I discussed the assessment and treatment plan with the patient  The patient was provided an opportunity to ask questions and all were answered. The patient agreed with the plan and demonstrated an understanding of the instructions.   The patient was advised to call back or seek an in-person evaluation if the symptoms worsen or if the condition fails to improve as anticipated.  Total time was 65 minutes. That includes chart review before the visit, the actual patient visit, and time spent on documentation after the visit.     LAURAINE LOISE BUOY, DO  [facility] [phone] (phone) [phone] (fax)  [facility]

## 2024-01-14 ENCOUNTER — Telehealth: Payer: Self-pay

## 2024-01-14 ENCOUNTER — Other Ambulatory Visit
Admission: RE | Admit: 2024-01-14 | Discharge: 2024-01-14 | Disposition: A | Discharge status: Home / Self Care | Source: Ambulatory Visit | Attending: Family Medicine | Admitting: Family Medicine

## 2024-01-14 DIAGNOSIS — Z1159 Encounter for screening for other viral diseases: Secondary | ICD-10-CM | POA: Insufficient documentation

## 2024-01-14 DIAGNOSIS — E349 Endocrine disorder, unspecified: Secondary | ICD-10-CM | POA: Diagnosis not present

## 2024-01-14 DIAGNOSIS — R7989 Other specified abnormal findings of blood chemistry: Secondary | ICD-10-CM | POA: Insufficient documentation

## 2024-01-14 DIAGNOSIS — G6289 Other specified polyneuropathies: Secondary | ICD-10-CM | POA: Insufficient documentation

## 2024-01-14 DIAGNOSIS — R944 Abnormal results of kidney function studies: Secondary | ICD-10-CM | POA: Diagnosis not present

## 2024-01-14 LAB — BASIC METABOLIC PANEL WITH GFR
Anion gap: 10 (ref 5–15)
BUN: 21 mg/dL — ABNORMAL HIGH (ref 6–20)
CO2: 23 mmol/L (ref 22–32)
Calcium: 9.2 mg/dL (ref 8.9–10.3)
Chloride: 106 mmol/L (ref 98–111)
Creatinine, Ser: 1.09 mg/dL (ref 0.61–1.24)
GFR, Estimated: >60 mL/min (ref >60)
Glucose, Bld: 99 mg/dL (ref 70–99)
Potassium: 4.1 mmol/L (ref 3.5–5.1)
Sodium: 139 mmol/L (ref 135–145)

## 2024-01-14 LAB — CBC
HCT: 45.2 % (ref 39.0–52.0)
Hemoglobin: 15.4 g/dL (ref 13.0–17.0)
MCH: 29.6 pg (ref 26.0–34.0)
MCHC: 34.1 g/dL (ref 30.0–36.0)
MCV: 86.9 fL (ref 80.0–100.0)
Platelets: 206 K/uL (ref 150–400)
RBC: 5.2 MIL/uL (ref 4.22–5.81)
RDW: 11.6 % (ref 11.5–15.5)
WBC: 6.6 K/uL (ref 4.0–10.5)
nRBC: 0 % (ref 0.0–0.2)

## 2024-01-14 LAB — VITAMIN B12: Vitamin B-12: 920 pg/mL — ABNORMAL HIGH (ref 180–914)

## 2024-01-14 LAB — VITAMIN D 25 HYDROXY (VIT D DEFICIENCY, FRACTURES): Vit D, 25-Hydroxy: 55.41 ng/mL (ref 30–100)

## 2024-01-14 NOTE — Telephone Encounter (Signed)
 Copied from CRM [id number]. Topic: General - Other >> Jan 14, 2024 12:16 PM Jake Elliott wrote: Reason for CRM: Patient went to hospital to get labs done, wanted to make sure that was alright and not have to go to [facility]

## 2024-01-15 LAB — MISC LABCORP TEST (SEND OUT): Labcorp test code: 349829

## 2024-01-15 LAB — HCV RT-PCR, QUANT (NON-GRAPH): Hepatitis C Quantitation: NOT DETECTED [IU]/mL

## 2024-01-15 LAB — HCV AB W REFLEX TO QUANT PCR: HCV Ab: REACTIVE — AB

## 2024-01-17 ENCOUNTER — Ambulatory Visit: Payer: Self-pay | Admitting: Student

## 2024-01-17 DIAGNOSIS — R002 Palpitations: Secondary | ICD-10-CM | POA: Diagnosis not present

## 2024-01-17 LAB — TESTOSTERONE,FREE AND TOTAL
Testosterone, Free: 3 pg/mL — ABNORMAL LOW (ref 7.2–24.0)
Testosterone: 273 ng/dL (ref 264–916)

## 2024-01-19 ENCOUNTER — Ambulatory Visit: Payer: Self-pay | Admitting: Family Medicine

## 2024-01-19 DIAGNOSIS — E349 Endocrine disorder, unspecified: Secondary | ICD-10-CM | POA: Insufficient documentation

## 2024-01-19 DIAGNOSIS — R768 Other specified abnormal immunological findings in serum: Secondary | ICD-10-CM

## 2024-01-19 DIAGNOSIS — R944 Abnormal results of kidney function studies: Secondary | ICD-10-CM | POA: Insufficient documentation

## 2024-01-19 NOTE — Addendum Note (Signed)
 Addended by: DONZELLA DOMINO on: 01/19/2024 09:37 PM   Modules accepted: Level of Service

## 2024-01-19 NOTE — Assessment & Plan Note (Addendum)
 No acute concerns.  Continue to monitor. Follows with psychiatry; defer to specialist management.

## 2024-01-25 ENCOUNTER — Ambulatory Visit (INDEPENDENT_AMBULATORY_CARE_PROVIDER_SITE_OTHER): Admitting: Urology

## 2024-01-25 VITALS — BP 163/103 | HR 84 | Ht 71.0 in | Wt 230.9 lb

## 2024-01-25 DIAGNOSIS — Z419 Encounter for procedure for purposes other than remedying health state, unspecified: Secondary | ICD-10-CM | POA: Diagnosis not present

## 2024-01-25 DIAGNOSIS — E291 Testicular hypofunction: Secondary | ICD-10-CM

## 2024-01-25 DIAGNOSIS — E349 Endocrine disorder, unspecified: Secondary | ICD-10-CM

## 2024-01-26 LAB — ESTRADIOL: Estradiol: 14.3 pg/mL (ref 7.6–42.6)

## 2024-01-26 LAB — LUTEINIZING HORMONE: LH: 5.8 m[IU]/mL (ref 1.7–8.6)

## 2024-01-28 ENCOUNTER — Encounter: Payer: Self-pay | Admitting: Urology

## 2024-01-28 ENCOUNTER — Other Ambulatory Visit
Admission: RE | Admit: 2024-01-28 | Discharge: 2024-01-28 | Disposition: A | Discharge status: Home / Self Care | Source: Ambulatory Visit | Attending: Family Medicine | Admitting: Family Medicine

## 2024-01-28 DIAGNOSIS — R768 Other specified abnormal immunological findings in serum: Secondary | ICD-10-CM | POA: Diagnosis not present

## 2024-01-28 NOTE — Progress Notes (Signed)
 01/25/2024 7:38 AM   Jake Elliott 07/17/1970 [id number]  Referring provider: Gareth Mliss FALCON, FNP [address] [place],  [place] [address]  Chief Complaint  Patient presents with   New Patient (Initial Visit)   Establish Care    HPI: Jake Elliott is a 53 y.o. male referred for evaluation of hypogonadism  Symptoms tiredness, fatigue, depressed mood, lack of motivation and difficulty losing central fat Was an only diagnosed with hypogonadism May 2024 with a testosterone  level of 173 and was started on testosterone  gel at that time. Video visit with endocrinology 02/01/2023.  Labs remarkable for testosterone  level of 104 and an LH level of <0.20 Testosterone  was subsequently held after HCT >54 and he was furred to hematology.  Negative hematology evaluation and was cleared to restart testosterone  however states his endocrinologist did not recommend continuing Most recent labs 01/14/2024 testosterone  273 ng/dL  PMH: Past Medical History:  Diagnosis Date   Angina at rest [facility])    Anxiety    Arthritis    Asthma    Attention-deficit/hyperactivity disorder    Bipolar disorder (HCC)    COPD (chronic obstructive pulmonary disease) (HCC)    Depression    GERD (gastroesophageal reflux disease)    GI bleed    Heart murmur    History of tobacco abuse    Hypertension    Hypertrophic cardiomyopathy (HCC)    s/p myomectomy   Peripheral neuropathy    PTSD (post-traumatic stress disorder)     Surgical History: Past Surgical History:  Procedure Laterality Date   CARDIAC CATHETERIZATION     11/14/2010   COLON SURGERY     CORONARY ARTERY BYPASS GRAFT     for myomectomy for hypertrophic cardiomyopathy    Home Medications:  Allergies as of 01/25/2024   No Known Allergies      Medication List        Accurate as of January 25, 2024 11:59 PM. If you have any questions, ask your nurse or doctor.          AeroChamber MV inhaler Use as  instructed   albuterol  108 (90 Base) MCG/ACT inhaler Commonly known as: VENTOLIN  HFA Inhale 2 puffs into the lungs every 6 (six) hours as needed for wheezing or shortness of breath.   ARTHRITIS STRENGTH BC POWDER PO Take 1,000 mg by mouth 3 (three) times daily.   cyclobenzaprine  10 MG tablet Commonly known as: FLEXERIL  TAKE 1 TABLET BY MOUTH THREE TIMES DAILY AS NEEDED FOR MUSCLE SPASM (THIS  CAN  MAKE  YOU  SLEEPY)   dextroamphetamine  10 MG tablet Commonly known as: DEXTROSTAT  Take 10 mg by mouth daily.   diphenhydrAMINE  25 MG tablet Commonly known as: Benadryl  Take 3 tablets (75 mg total) by mouth at bedtime as needed. What changed: how much to take   ibuprofen  600 MG tablet Commonly known as: ADVIL  Take 600 mg by mouth every 6 (six) hours as needed for moderate pain (pain score 4-6).   metoCLOPramide  10 MG tablet Commonly known as: REGLAN  Take 1 tablet (10 mg total) by mouth every 6 (six) hours as needed.   Oxycodone  HCl 10 MG Tabs Take by mouth.   pantoprazole  40 MG tablet Commonly known as: Protonix  Take 1 tablet (40 mg total) by mouth 2 (two) times daily.   rosuvastatin  10 MG tablet Commonly known as: CRESTOR  Take 1 tablet (10 mg total) by mouth daily.   Spiriva  Respimat 2.5 MCG/ACT Aers Generic drug: Tiotropium Bromide Monohydrate  Inhale 2  puffs into the lungs daily.   Symbicort  160-4.5 MCG/ACT inhaler Generic drug: budesonide -formoterol  INHALE 2 PUFFS BY MOUTH IN THE MORNING AND 2 AT BEDTIME   tadalafil  20 MG tablet Commonly known as: CIALIS  TAKE 1 TABLET BY MOUTH ONCE DAILY AS NEEDED FOR ERECTILE DYSFUNCTION   triamcinolone  ointment 0.5 % Commonly known as: KENALOG  Apply 1 Application topically 2 (two) times daily.   verapamil  120 MG tablet Commonly known as: CALAN  Take 1 tablet (120 mg total) by mouth 2 (two) times daily.        Allergies: No Known Allergies  Family History: Family History  Problem Relation Age of Onset   Cancer - Lung  Mother    Skin cancer Mother    Alcohol abuse Mother    Drug abuse Mother    Stroke Father    Hypertension Father    Alcohol abuse Father    Drug abuse Father    Drug abuse Sister     Social History:  reports that he has been smoking cigarettes. He started smoking about 35 years ago. He has a 70.1 pack-year smoking history. He has never used smokeless tobacco. He reports that he does not drink alcohol and does not use drugs.   Physical Exam: BP (!) 163/103 (BP Location: Left Arm, Patient Position: Sitting, Cuff Size: Large)   Pulse 84   Ht 5' 11 (1.803 m)   Wt 230 lb 14.4 oz (104.7 kg)   SpO2 98%   BMI 32.20 kg/m   Constitutional:  Alert and oriented, No acute distress. HEENT: [place] AT Cardiovascular: No clubbing, cyanosis, or edema. Respiratory: Normal respiratory effort, no increased work of breathing. Psychiatric: Normal mood and affect.   Assessment & Plan:    1.  Hypogonadism Prior LH level was very low however was on testosterone  gel at the time although his testosterone  level while on the gel was ~100 ng/dL He has been off testosterone  for over 1 year and a repeat LH level was drawn today.  If LH level remains low will need a pituitary MRI He will be notified with results and further recommendations at that time.   Jake JAYSON Barba, MD  [facility] [address] [place], [place] [address] [phone]

## 2024-01-30 ENCOUNTER — Ambulatory Visit: Payer: Self-pay | Admitting: Urology

## 2024-01-31 LAB — PSA, SERUM (SERIAL MONITOR)

## 2024-02-01 ENCOUNTER — Other Ambulatory Visit (INDEPENDENT_AMBULATORY_CARE_PROVIDER_SITE_OTHER): Payer: Self-pay | Admitting: Nurse Practitioner

## 2024-02-01 ENCOUNTER — Encounter (INDEPENDENT_AMBULATORY_CARE_PROVIDER_SITE_OTHER): Payer: Self-pay | Admitting: Nurse Practitioner

## 2024-02-01 ENCOUNTER — Ambulatory Visit (INDEPENDENT_AMBULATORY_CARE_PROVIDER_SITE_OTHER)

## 2024-02-01 ENCOUNTER — Ambulatory Visit (INDEPENDENT_AMBULATORY_CARE_PROVIDER_SITE_OTHER): Admitting: Nurse Practitioner

## 2024-02-01 ENCOUNTER — Other Ambulatory Visit: Payer: Self-pay | Admitting: Urology

## 2024-02-01 VITALS — BP 150/94 | HR 75 | Ht 71.0 in | Wt 234.0 lb

## 2024-02-01 DIAGNOSIS — I83813 Varicose veins of bilateral lower extremities with pain: Secondary | ICD-10-CM

## 2024-02-01 DIAGNOSIS — I8311 Varicose veins of right lower extremity with inflammation: Secondary | ICD-10-CM | POA: Diagnosis not present

## 2024-02-01 DIAGNOSIS — I8312 Varicose veins of left lower extremity with inflammation: Secondary | ICD-10-CM | POA: Diagnosis not present

## 2024-02-01 LAB — HCV RT-PCR, QUANT (NON-GRAPH): Hepatitis C Quantitation: NOT DETECTED [IU]/mL

## 2024-02-01 LAB — SPECIMEN STATUS REPORT

## 2024-02-01 LAB — HCV AB W REFLEX TO QUANT PCR: HCV Ab: REACTIVE — AB

## 2024-02-01 LAB — PSA, SERUM (SERIAL MONITOR): Prostate Specific Ag, Serum: 0.8 ng/mL (ref 0.0–4.0)

## 2024-02-01 MED ORDER — NEEDLE (DISP) 18G X 1" MISC
0 refills | Status: AC
Start: 2024-02-01 — End: ?

## 2024-02-01 MED ORDER — TESTOSTERONE CYPIONATE 200 MG/ML IM SOLN: 200.0000 mg | INTRAMUSCULAR | 1 refills | Status: DC

## 2024-02-01 MED ORDER — LUER LOCK SAFETY SYRINGES 21G X 1-1/2" 3 ML MISC: 0 refills | Status: AC

## 2024-02-01 NOTE — Progress Notes (Signed)
 Subjective:    Patient ID: Jake Elliott, male    DOB: Jan 16, 1971, 53 y.o.   MRN: [id number] Chief Complaint  Patient presents with   Follow-up    fu pt conv + Bilat Venous Reflux    The patient is a 53 year old male who presents today for concerns for color changes in his lower extremities.  He notes that he has had Jake Elliott discoloration on his legs for about 20 years or so.  He notes that he has swelling of his lower extremities that is off and on and has also occurred for many years.  He notes that he has also had multiple instances of cellulitis.  He notes that he utilizes medical grade compression but it is difficult, given the pain that it causes in his feet from his neuropathy.  He denies any claudication-like symptoms.  He denies any history of DVT but notes a history of a pulmonary embolism.  He was on Eliquis  but had to stop due to GI bleed.  Previously he had arterial studies done the patient has an ABI of 1.25 on the right and 1.24 on the left.  He has normal TBI's bilaterally.  He has strong triphasic tibial artery waveforms with normal toe waveforms bilaterally.  This is consistent with the previous studies he had done in 2023.  He does have some notable areas of varicosities bilaterally.  He also has a varicosity on his right lower extremity which has had multiple episodes of bleeding and has required emergency attention.  Today, noninvasive studies show no evidence of DVT or superficial thrombophlebitis bilaterally.  No evidence of deep venous insufficiency bilaterally.  He does have evidence of superficial venous reflux in the bilateral great saphenous veins.    Review of Systems  Cardiovascular:  Positive for leg swelling.  Skin:  Positive for color change.  All other systems reviewed and are negative.      Objective:   Physical Exam Vitals reviewed.  HENT:     Head: Normocephalic.  Cardiovascular:     Rate and Rhythm: Normal rate.     Pulses: Normal pulses.   Pulmonary:     Effort: Pulmonary effort is normal.  Musculoskeletal:     Right lower leg: 1+ Edema present.     Left lower leg: 1+ Edema present.  Skin:    General: Skin is warm and dry.  Neurological:     Mental Status: He is alert and oriented to person, place, and time.  Psychiatric:        Mood and Affect: Mood normal.        Behavior: Behavior normal.        Thought Content: Thought content normal.        Judgment: Judgment normal.     BP (!) 150/94   Pulse 75   Ht 5' 11 (1.803 m)   Wt 234 lb (106.1 kg)   BMI 32.64 kg/m   Past Medical History:  Diagnosis Date   Angina at rest [facility])    Anxiety    Arthritis    Asthma    Attention-deficit/hyperactivity disorder    Bipolar disorder (HCC)    COPD (chronic obstructive pulmonary disease) (HCC)    Depression    GERD (gastroesophageal reflux disease)    GI bleed    Heart murmur    History of tobacco abuse    Hypertension    Hypertrophic cardiomyopathy (HCC)    s/p myomectomy   Peripheral neuropathy    PTSD (post-traumatic  stress disorder)     Social History   Socioeconomic History   Marital status: Single    Spouse name: Not on file   Number of children: Not on file   Years of education: Not on file   Highest education level: Not on file  Occupational History   Not on file  Tobacco Use   Smoking status: Every Day    Current packs/day: 2.00    Average packs/day: 2.0 packs/day for 35.1 years (70.2 ttl pk-yrs)    Types: Cigarettes    Start date: 12/31/1988   Smokeless tobacco: Never   Tobacco comments:    1 PPD (currently smoking)  Vaping Use   Vaping status: Never Used  Substance and Sexual Activity   Alcohol use: No    Alcohol/week: 0.0 standard drinks of alcohol   Drug use: No   Sexual activity: Yes    Partners: Female    Birth control/protection: None  Other Topics Concern   Not on file  Social History Narrative   Not on file   Social Drivers of Health   Financial Resource Strain: Not on  file  Food Insecurity: Food Insecurity Present (03/23/2023)   Hunger Vital Sign    Worried About Running Out of Food in the Last Year: Sometimes true    Ran Out of Food in the Last Year: Sometimes true  Transportation Needs: No Transportation Needs (03/23/2023)   PRAPARE - Administrator, Civil Service (Medical): No    Lack of Transportation (Non-Medical): No  Physical Activity: Not on file  Stress: Not on file  Social Connections: Not on file  Intimate Partner Violence: Not At Risk (03/23/2023)   Humiliation, Afraid, Rape, and Kick questionnaire    Fear of Current or Ex-Partner: No    Emotionally Abused: No    Physically Abused: No    Sexually Abused: No    Past Surgical History:  Procedure Laterality Date   CARDIAC CATHETERIZATION     11/14/2010   COLON SURGERY     CORONARY ARTERY BYPASS GRAFT     for myomectomy for hypertrophic cardiomyopathy    Family History  Problem Relation Age of Onset   Cancer - Lung Mother    Skin cancer Mother    Alcohol abuse Mother    Drug abuse Mother    Stroke Father    Hypertension Father    Alcohol abuse Father    Drug abuse Father    Drug abuse Sister     No Known Allergies     Latest Ref Rng & Units 01/14/2024   12:30 PM 10/12/2023    2:19 PM 10/08/2023    5:27 PM  CBC  WBC 4.0 - 10.5 K/uL 6.6  11.9  4.9   Hemoglobin 13.0 - 17.0 g/dL 84.5  84.4  84.3   Hematocrit 39.0 - 52.0 % 45.2  44.9  44.2   Platelets 150 - 400 K/uL 206  194  170       CMP     Component Value Date/Time   NA 139 01/14/2024 1230   NA 138 07/05/2019 1530   NA 134 (L) 06/14/2013 2021   K 4.1 01/14/2024 1230   K 3.4 (L) 06/14/2013 2021   CL 106 01/14/2024 1230   CL 102 06/14/2013 2021   CO2 23 01/14/2024 1230   CO2 24 06/14/2013 2021   GLUCOSE 99 01/14/2024 1230   GLUCOSE 103 (H) 06/14/2013 2021   BUN 21 (H) 01/14/2024 1230   BUN  21 07/05/2019 1530   BUN 10 06/14/2013 2021   CREATININE 1.09 01/14/2024 1230   CREATININE 0.92 06/14/2013  2021   CALCIUM  9.2 01/14/2024 1230   CALCIUM  8.8 06/14/2013 2021   PROT 6.3 (L) 11/10/2023 0910   PROT 6.8 07/05/2019 1530   PROT 7.4 06/14/2013 2021   ALBUMIN 4.0 11/10/2023 0910   ALBUMIN 4.5 07/05/2019 1530   ALBUMIN 3.6 06/14/2013 2021   AST 19 11/10/2023 0910   AST 14 (L) 06/14/2013 2021   ALT 23 11/10/2023 0910   ALT 14 06/14/2013 2021   ALKPHOS 58 11/10/2023 0910   ALKPHOS 91 06/14/2013 2021   BILITOT 0.6 11/10/2023 0910   BILITOT 0.8 07/05/2019 1530   BILITOT 1.2 (H) 06/14/2013 2021   GFR 71.50 01/22/2023 1000   GFRNONAA >60 01/14/2024 1230   GFRNONAA >60 06/14/2013 2021     VAS US  ABI WITH/WO TBI Result Date: 01/10/2024  LOWER EXTREMITY DOPPLER STUDY Patient Name:  Jake Elliott  Date of Exam:   01/05/2024 Medical Rec #: [id number]               Accession #:    [id number] Date of Birth: [birth year]               Patient Gender: M Patient Age:   32 years Exam Location:  [facility] Procedure:      VAS US  ABI WITH/WO TBI Referring Phys: Cordella Shawl --------------------------------------------------------------------------------  Indications: Discoloration in the Legs  Performing Technologist: Leafy Gibes RVS  Examination Guidelines: A complete evaluation includes at minimum, Doppler waveform signals and systolic blood pressure reading at the level of bilateral brachial, anterior tibial, and posterior tibial arteries, when vessel segments are accessible. Bilateral testing is considered an integral part of a complete examination. Photoelectric Plethysmograph (PPG) waveforms and toe systolic pressure readings are included as required and additional duplex testing as needed. Limited examinations for reoccurring indications may be performed as noted.  ABI Findings: +---------+------------------+-----+---------+--------+ Right    Rt Pressure (mmHg)IndexWaveform Comment  +---------+------------------+-----+---------+--------+ Brachial 154                                       +---------+------------------+-----+---------+--------+ ATA      190               1.21 triphasic         +---------+------------------+-----+---------+--------+ PTA      196               1.25 triphasic         +---------+------------------+-----+---------+--------+ Great Toe250               1.59 Normal          +---------+------------------+-----+---------+--------+ +---------+------------------+-----+---------+-------+ Left     Lt Pressure (mmHg)IndexWaveform Comment +---------+------------------+-----+---------+-------+ Brachial 157                                     +---------+------------------+-----+---------+-------+ ATA      195               1.24 triphasic        +---------+------------------+-----+---------+-------+ PTA      179               1.14 triphasic        +---------+------------------+-----+---------+-------+ Jake Elliott  1.02 Normal           +---------+------------------+-----+---------+-------+ +-------+-----------+-----------+------------+------------+ ABI/TBIToday's ABIToday's TBIPrevious ABIPrevious TBI +-------+-----------+-----------+------------+------------+ Right  1.25       1.59                                +-------+-----------+-----------+------------+------------+ Left   1.24       1.02                                +-------+-----------+-----------+------------+------------+  Summary: Right: Resting right ankle-brachial index is within normal range. The right toe-brachial index is normal. Left: Resting left ankle-brachial index is within normal range. The left toe-brachial index is normal. *See table(s) above for measurements and observations.  Electronically signed by Cordella Shawl MD on 01/10/2024 at 8:34:13 AM.    Final        Assessment & Plan:   1. Varicose veins of both lower extremities with inflammation (Primary) We had a long discussion regarding varicose  veins and how it relates to venous insufficiency with his test results.  The patient's stasis dermatitis is certainly caused by his venous reflux.  We discussed treatment options including conservative therapy which would include the use of medical grade compression socks, elevation and activity versus intervention including endovenous laser ablation.  At this time the patient wishes to think about these options he will contact us  if he wishes to move forward with intervention.   Current Outpatient Medications on File Prior to Visit  Medication Sig Dispense Refill   albuterol  (VENTOLIN  HFA) 108 (90 Base) MCG/ACT inhaler Inhale 2 puffs into the lungs every 6 (six) hours as needed for wheezing or shortness of breath. 8 g 11   Aspirin-Salicylamide-Caffeine (ARTHRITIS STRENGTH BC POWDER PO) Take 1,000 mg by mouth 3 (three) times daily.     cyclobenzaprine  (FLEXERIL ) 10 MG tablet TAKE 1 TABLET BY MOUTH THREE TIMES DAILY AS NEEDED FOR MUSCLE SPASM (THIS  CAN  MAKE  YOU  SLEEPY) 90 tablet 1   dextroamphetamine  (DEXTROSTAT ) 10 MG tablet Take 10 mg by mouth daily.     diphenhydrAMINE  (BENADRYL ) 25 MG tablet Take 3 tablets (75 mg total) by mouth at bedtime as needed. 90 tablet 1   ibuprofen  (ADVIL ) 600 MG tablet Take 600 mg by mouth every 6 (six) hours as needed for moderate pain (pain score 4-6).     metoCLOPramide  (REGLAN ) 10 MG tablet Take 1 tablet (10 mg total) by mouth every 6 (six) hours as needed. 30 tablet 0   Oxycodone  HCl 10 MG TABS Take by mouth.     pantoprazole  (PROTONIX ) 40 MG tablet Take 1 tablet (40 mg total) by mouth 2 (two) times daily. 180 tablet 1   rosuvastatin  (CRESTOR ) 10 MG tablet Take 1 tablet (10 mg total) by mouth daily. 90 tablet 3   Spacer/Aero-Holding Chambers (AEROCHAMBER MV) inhaler Use as instructed 1 each 0   SYMBICORT  160-4.5 MCG/ACT inhaler INHALE 2 PUFFS BY MOUTH IN THE MORNING AND 2 AT BEDTIME 11 g 11   tadalafil  (CIALIS ) 20 MG tablet TAKE 1 TABLET BY MOUTH ONCE DAILY AS  NEEDED FOR ERECTILE DYSFUNCTION 30 tablet 0   Tiotropium Bromide Monohydrate  (SPIRIVA  RESPIMAT) 2.5 MCG/ACT AERS Inhale 2 puffs into the lungs daily. 60 each 11   triamcinolone  ointment (KENALOG ) 0.5 % Apply 1 Application topically 2 (two) times daily. 30 g 3   verapamil  (  CALAN ) 120 MG tablet Take 1 tablet (120 mg total) by mouth 2 (two) times daily. 180 tablet 3   No current facility-administered medications on file prior to visit.    There are no Patient Instructions on file for this visit. No follow-ups on file.   Avinash Maltos E Arita Severtson, NP

## 2024-02-02 ENCOUNTER — Telehealth: Payer: Self-pay | Admitting: Family Medicine

## 2024-02-02 NOTE — Telephone Encounter (Signed)
 Copied from CRM #[id number]. Topic: General - Call Back - No Documentation >> Feb 01, 2024  4:12 PM Wess RAMAN wrote: Reason for CRM: Patient would like a call back from Donzella Domino.  Callback #: [phone]

## 2024-02-03 ENCOUNTER — Encounter (INDEPENDENT_AMBULATORY_CARE_PROVIDER_SITE_OTHER)

## 2024-02-03 ENCOUNTER — Ambulatory Visit (INDEPENDENT_AMBULATORY_CARE_PROVIDER_SITE_OTHER): Admitting: Vascular Surgery

## 2024-02-03 DIAGNOSIS — M5416 Radiculopathy, lumbar region: Secondary | ICD-10-CM | POA: Diagnosis not present

## 2024-02-03 DIAGNOSIS — M5432 Sciatica, left side: Secondary | ICD-10-CM | POA: Diagnosis not present

## 2024-02-03 DIAGNOSIS — M5030 Other cervical disc degeneration, unspecified cervical region: Secondary | ICD-10-CM | POA: Diagnosis not present

## 2024-02-03 DIAGNOSIS — M5431 Sciatica, right side: Secondary | ICD-10-CM | POA: Diagnosis not present

## 2024-02-07 ENCOUNTER — Ambulatory Visit: Payer: Self-pay | Admitting: Family Medicine

## 2024-02-11 ENCOUNTER — Encounter: Payer: Self-pay | Admitting: Family Medicine

## 2024-02-11 DIAGNOSIS — N529 Male erectile dysfunction, unspecified: Secondary | ICD-10-CM

## 2024-02-11 DIAGNOSIS — E038 Other specified hypothyroidism: Secondary | ICD-10-CM

## 2024-02-15 MED ORDER — LEVOTHYROXINE SODIUM 100 MCG PO TABS: 100.0000 ug | ORAL_TABLET | Freq: Every day | ORAL | 0 refills | Status: AC

## 2024-02-15 MED ORDER — TADALAFIL 20 MG PO TABS: 20.0000 mg | ORAL_TABLET | Freq: Every day | ORAL | 0 refills | Status: DC | PRN

## 2024-02-18 ENCOUNTER — Encounter: Payer: Self-pay | Admitting: Family Medicine

## 2024-02-18 ENCOUNTER — Ambulatory Visit: Admitting: Nurse Practitioner

## 2024-02-18 ENCOUNTER — Ambulatory Visit (INDEPENDENT_AMBULATORY_CARE_PROVIDER_SITE_OTHER): Admitting: Family Medicine

## 2024-02-18 VITALS — BP 163/93 | HR 81 | Ht 71.0 in | Wt 239.0 lb

## 2024-02-18 DIAGNOSIS — I421 Obstructive hypertrophic cardiomyopathy: Secondary | ICD-10-CM | POA: Diagnosis not present

## 2024-02-18 DIAGNOSIS — K921 Melena: Secondary | ICD-10-CM | POA: Diagnosis not present

## 2024-02-18 DIAGNOSIS — R768 Other specified abnormal immunological findings in serum: Secondary | ICD-10-CM | POA: Diagnosis not present

## 2024-02-18 DIAGNOSIS — Z8719 Personal history of other diseases of the digestive system: Secondary | ICD-10-CM

## 2024-02-18 DIAGNOSIS — I739 Peripheral vascular disease, unspecified: Secondary | ICD-10-CM

## 2024-02-18 DIAGNOSIS — R413 Other amnesia: Secondary | ICD-10-CM

## 2024-02-18 DIAGNOSIS — R7989 Other specified abnormal findings of blood chemistry: Secondary | ICD-10-CM

## 2024-02-18 DIAGNOSIS — Q248 Other specified congenital malformations of heart: Secondary | ICD-10-CM | POA: Diagnosis not present

## 2024-02-18 DIAGNOSIS — N529 Male erectile dysfunction, unspecified: Secondary | ICD-10-CM

## 2024-02-18 MED ORDER — TADALAFIL 20 MG PO TABS: 20.0000 mg | ORAL_TABLET | Freq: Every day | ORAL | 0 refills | Status: DC

## 2024-02-18 NOTE — Progress Notes (Signed)
 Established patient visit   Patient: Jake Elliott   DOB: [birth year]   53 y.o. Male  MRN: [id number] Visit Date: 02/18/2024  Today's healthcare provider: LAURAINE LOISE BUOY, DO   Chief Complaint  Patient presents with   Follow-up    No concerns   Subjective    HPI Jake Elliott is a 53 year old male who presents with ongoing stomach pain and memory concerns.  He experiences ongoing stomach pain and occasional hematochezia, which he describes as originating internally. No hemorrhoids are present. His symptoms were exacerbated last year due to active bleeding while on blood thinners for a clot in his left pulmonary vein. A CT scan was performed last year, but the patient has not pursued follow-up on these findings since then.   He reports memory issues, including difficulty maintaining thoughts and frequent loss of train of thought, worsening over the past four to five years. He uses incorrect words and has short-term memory problems, though his long-term memory remains intact.   He has a history of using tadalafil , which he reports was prescribed for both erectile dysfunction but now taken for blood pressure management. He has tried various blood pressure medications, each with undesirable side effects. He has a history of left ventricular outflow tract obstruction and continues to take tadalafil  to address this.  He reports a high B12 level despite not supplementing with B vitamins for over a year and a half. His diet is limited, with minimal intake of eggs, meats, dairy, and fortified cereals.  He has a history of a false positive hepatitis C test and plans to retest. He donated blood last year without issues. He has not received the COVID booster, shingles vaccine, hepatitis B vaccination, or pneumonia vaccine.       Medications: Outpatient Medications Prior to Visit  Medication Sig   albuterol  (VENTOLIN  HFA) 108 (90 Base) MCG/ACT inhaler Inhale 2 puffs into  the lungs every 6 (six) hours as needed for wheezing or shortness of breath.   Aspirin-Salicylamide-Caffeine (ARTHRITIS STRENGTH BC POWDER PO) Take 1,000 mg by mouth 3 (three) times daily.   cyclobenzaprine  (FLEXERIL ) 10 MG tablet TAKE 1 TABLET BY MOUTH THREE TIMES DAILY AS NEEDED FOR MUSCLE SPASM (THIS  CAN  MAKE  YOU  SLEEPY)   dextroamphetamine  (DEXTROSTAT ) 10 MG tablet Take 10 mg by mouth daily.   diphenhydrAMINE  (BENADRYL ) 25 MG tablet Take 3 tablets (75 mg total) by mouth at bedtime as needed.   ibuprofen  (ADVIL ) 600 MG tablet Take 600 mg by mouth every 6 (six) hours as needed for moderate pain (pain score 4-6).   levothyroxine  (SYNTHROID ) 100 MCG tablet Take 1 tablet (100 mcg total) by mouth daily.   metoCLOPramide  (REGLAN ) 10 MG tablet Take 1 tablet (10 mg total) by mouth every 6 (six) hours as needed.   NEEDLE, DISP, 18 G 18G X 1 MISC Use as directed   Oxycodone  HCl 10 MG TABS Take by mouth.   pantoprazole  (PROTONIX ) 40 MG tablet Take 1 tablet (40 mg total) by mouth 2 (two) times daily.   rosuvastatin  (CRESTOR ) 10 MG tablet Take 1 tablet (10 mg total) by mouth daily.   Spacer/Aero-Holding Chambers (AEROCHAMBER MV) inhaler Use as instructed   SYMBICORT  160-4.5 MCG/ACT inhaler INHALE 2 PUFFS BY MOUTH IN THE MORNING AND 2 AT BEDTIME   SYRINGE-NEEDLE, DISP, 3 ML (LUER LOCK SAFETY SYRINGES) 21G X 1-1/2 3 ML MISC Use as directed for testosterone  administration   testosterone  cypionate (DEPOTESTOSTERONE  CYPIONATE) 200 MG/ML injection Inject 1 mL (200 mg total) into the muscle every 14 (fourteen) days.   Tiotropium Bromide Monohydrate  (SPIRIVA  RESPIMAT) 2.5 MCG/ACT AERS Inhale 2 puffs into the lungs daily.   triamcinolone  ointment (KENALOG ) 0.5 % Apply 1 Application topically 2 (two) times daily.   verapamil  (CALAN ) 120 MG tablet Take 1 tablet (120 mg total) by mouth 2 (two) times daily.   [DISCONTINUED] tadalafil  (CIALIS ) 20 MG tablet Take 1 tablet (20 mg total) by mouth daily as needed for  erectile dysfunction.   No facility-administered medications prior to visit.        Objective    BP (!) 163/93   Pulse 81   Ht 5' 11 (1.803 m)   Wt 239 lb (108.4 kg)   SpO2 97%   BMI 33.33 kg/m     Physical Exam Vitals and nursing note reviewed.  Constitutional:      General: He is not in acute distress.    Appearance: Normal appearance.  HENT:     Head: Normocephalic and atraumatic.  Eyes:     General: No scleral icterus.    Conjunctiva/sclera: Conjunctivae normal.  Cardiovascular:     Rate and Rhythm: Normal rate.  Pulmonary:     Effort: Pulmonary effort is normal.  Neurological:     Mental Status: He is alert and oriented to person, place, and time. Mental status is at baseline.  Psychiatric:        Mood and Affect: Mood normal.        Behavior: Behavior normal.      Results for orders placed or performed in visit on 02/18/24  HCV Ab w Reflex to Quant PCR  Result Value Ref Range   HCV Ab Reactive (A) Non Reactive  TSH + free T4  Result Value Ref Range   TSH 3.040 0.450 - 4.500 uIU/mL   Free T4 1.14 0.82 - 1.77 ng/dL  Vitamin B12  Result Value Ref Range   Vitamin B-12 1,131 232 - 1,245 pg/mL  HCV RT-PCR, Quant (Non-Graph)  Result Value Ref Range   Hepatitis C Quantitation HCV Not Detected IU/mL   Test Information (HCV): Comment    Interpretation (HCV): Comment     Assessment & Plan    Blood in stool, frank -     Ambulatory referral to Gastroenterology  History of GI bleed -     Ambulatory referral to Gastroenterology  Positive hepatitis C antibody test -     HCV Ab w Reflex to Quant PCR -     HCV RT-PCR, Quant (Non-Graph)  Memory changes -     Ambulatory referral to Neurology -     TSH + free T4  Hypertrophic obstructive cardiomyopathy (HCC) -     Tadalafil ; Take 1 tablet (20 mg total) by mouth daily.  Dispense: 30 tablet; Refill: 0  Left ventricular outflow obstruction -     Tadalafil ; Take 1 tablet (20 mg total) by mouth daily.   Dispense: 30 tablet; Refill: 0  Erectile dysfunction, unspecified erectile dysfunction type -     Tadalafil ; Take 1 tablet (20 mg total) by mouth daily.  Dispense: 30 tablet; Refill: 0  Elevated vitamin B12 level -     Vitamin B12  Peripheral vascular disease of lower extremity (HCC)     Blood in stool, frank; history of gastrointestinal bleeding and melena; Ongoing abdominal pain and hematochezia. Previous CT (at time of GI bleed) showed descending colon thickening. Possible link to past GI bleeding and  anticoagulation therapy. - Refer to gastroenterology for further evaluation of gastrointestinal bleeding.  Positive hepatitis C antibody test Recent positive hepatitis C antibody test with no detection of viral load.  Suspected false positive hepatitis C test. Paitent donated blood last year without known problems. - Retest for hepatitis C, due to short interval between first two tests.   - Refer to gastroenterology as noted above; patient plans to discuss hep C test with GI at that time as well.  Memory changes Worsening short-term memory over 4-5 years with difficulty in thought retention and word usage. Impact of hypotestosteronism and newly-started testosterone  therapy considered due to potential cognitive benefits. - Will check thyroid  levels today. - Refer to neurology for further evaluation of cognitive impairment per patient preference.  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 Tadalafil  used off-label for blood pressure management due to LVOT obstruction and side effects from other antihypertensives. - Continue tadalafil  for blood pressure management. Short-term refill provided. - Follow up with cardiology on March 07, 2024.  Peripheral vascular disease with lower extremity symptoms Peripheral vascular disease with symptoms in lower extremities. Declined ablation  therapy. Compression socks cause nerve pain. Previous vascular surgery consultation unchanged treatment plan. - Continue conservative management with leg elevation. - Recommend trying alternative compression socks due to discomfort with previous.  Male erectile dysfunction Tadalafil  prescribed for erectile dysfunction and blood pressure management.  - Continue tadalafil .  Elevated vitamin B12 level, etiology unclear Elevated B12 level without supplement intake or dietary sources. Diet review confirmed minimal significant B12 sources.     Return in about 3 months (around 05/19/2024) for Chronic f/u.      I discussed the assessment and treatment plan with the patient  The patient was provided an opportunity to ask questions and all were answered. The patient agreed with the plan and demonstrated an understanding of the instructions.   The patient was advised to call back or seek an in-person evaluation if the symptoms worsen or if the condition fails to improve as anticipated.    LAURAINE LOISE BUOY, DO  [facility] [phone] (phone) [phone] (fax)  [facility]

## 2024-02-19 LAB — HCV RT-PCR, QUANT (NON-GRAPH)

## 2024-02-20 LAB — HCV AB W REFLEX TO QUANT PCR: HCV Ab: REACTIVE — AB

## 2024-02-20 LAB — HCV RT-PCR, QUANT (NON-GRAPH): Hepatitis C Quantitation: NOT DETECTED [IU]/mL

## 2024-02-20 LAB — VITAMIN B12: Vitamin B-12: 1131 pg/mL (ref 232–1245)

## 2024-02-20 LAB — TSH+FREE T4
Free T4: 1.14 ng/dL (ref 0.82–1.77)
TSH: 3.04 u[IU]/mL (ref 0.450–4.500)

## 2024-02-20 NOTE — Progress Notes (Unsigned)
 Cardiology Office Note  Date:  02/21/2024   ID:  Jake Elliott, Jake Elliott 03/11/1971, MRN [id number]  PCP:  Jake Lauraine SAILOR, DO   Chief Complaint  Patient presents with   3-4 month follow up     Patient c/o chest pain and shortness of breath.    HPI:  Jake Elliott is a 53 year old gentleman who has a history of  ADHD, insomnia and bipolar.  HOCM, myomectomy in 2008 ,  moderate LVH, septal hypertrophy cardiac catheterization June 2012 showing no significant coronary disease,  sleep apnea, unable to tolerate CPAP,  nonsustained VT Prior history of heavy opiate use for chronic pain Takes high-dose NSAIDs, aspirin for chronic pain and headaches/neck pain who presents for routine followup of his tachycardia and hypertension  Last seen by myself in clinic 8/24 In follow-up he reports that he continues to wake in the middle of the night, body is shaking, pulsating Previously unable to tolerate CPAP  States he took himself off metoprolol  in the evening  Felt worse, was running  too close to the verapamil  He has decreased his verapamil  dosing in half twice daily  Chronic lower extremity swelling dating back 20 years Concerned about elevated B12 Continues to smoke, does not want to quit  CT scan abdomen pelvis images pulled up and reviewed showing aortic atherosclerosis  History of bipolar/depression/anxiety/ADHD  Reports he is relatively sedentary, stays in the house most of the day No hobbies  history of PE was placed on Eliquis  hospital April 2024 with abdominal pain, rectal bleeding There is recommended he take Cipro  and Flagyl  for colitis , Stopped Eliquis   EKG personally reviewed by myself on todays visit EKG Interpretation Date/Time:  Monday February 21 2024 16:47:45 EDT Ventricular Rate:  99 PR Interval:  148 QRS Duration:  126 QT Interval:  354 QTC Calculation: 454 R Axis:   -31  Text Interpretation: Normal sinus rhythm Possible Left atrial enlargement Left  axis deviation Left ventricular hypertrophy with QRS widening ( Cornell product ) Cannot rule out Septal infarct (cited on or before 30-Jun-2008) T wave abnormality, consider lateral ischemia When compared with ECG of 08-Oct-2023 17:27, QRS axis Shifted left ST no longer depressed in Inferior leads T wave inversion no longer evident in Inferior leads T wave inversion now evident in Lateral leads Confirmed by Jake Elliott [phone]) on 02/21/2024 4:52:47 PM    Of past medical history reviewed declined referral to hocm clinic and declined medications  MRI neck, performed at [facility] Prominent right central  disc osteophyte and uncovertebral joint hypertrophy at C5-6 causes compression of the spinal cord and severe narrowing also extending into the  right neural foramen (25:81). At C6-7, a disc osteophyte and uncovertebral  joint hypertrophy indents the spinal cord and causes mild canal stenosis.  No high-grade neural foraminal narrowing.Jake Elliott  History of anxiety, presenting with tightness in back, palpitations, fatigue, headaches  Recent Echocardiogram February 2021 reviewed on today's visit Normal ejection fraction, severe asymmetric left ventricular hypertrophy disproportionately affecting the septum SAM with trivial MR  Previously was on calcium  channel blockers and beta blockers. He did develop second-degree heart block on both, was continued on metoprolol .    history of anxiety depression. He has had a long hospital course requiring assistance from psychiatry.   chronic pain syndrome.   Prior Holter monitor in 2013 showing normal sinus rhythm with APCs and PVCs   PMH:   has a past medical history of Angina at rest [facility]), Anxiety, Arthritis, Asthma, Attention-deficit/hyperactivity disorder, Bipolar disorder (  HCC), COPD (chronic obstructive pulmonary disease) (HCC), Depression, GERD (gastroesophageal reflux disease), GI bleed, Heart murmur, History of tobacco abuse, Hypertension, Hypertrophic  cardiomyopathy (HCC), Peripheral neuropathy, and PTSD (post-traumatic stress disorder).  PSH:    Past Surgical History:  Procedure Laterality Date   CARDIAC CATHETERIZATION     11/14/2010   COLON SURGERY     CORONARY ARTERY BYPASS GRAFT     for myomectomy for hypertrophic cardiomyopathy    Current Outpatient Medications  Medication Sig Dispense Refill   albuterol  (VENTOLIN  HFA) 108 (90 Base) MCG/ACT inhaler Inhale 2 puffs into the lungs every 6 (six) hours as needed for wheezing or shortness of breath. 8 g 11   Aspirin-Salicylamide-Caffeine (ARTHRITIS STRENGTH BC POWDER PO) Take 1,000 mg by mouth 3 (three) times daily.     cyclobenzaprine  (FLEXERIL ) 10 MG tablet TAKE 1 TABLET BY MOUTH THREE TIMES DAILY AS NEEDED FOR MUSCLE SPASM (THIS  CAN  MAKE  YOU  SLEEPY) 90 tablet 1   dextroamphetamine  (DEXTROSTAT ) 10 MG tablet Take 10 mg by mouth daily.     diphenhydrAMINE  (BENADRYL ) 25 MG tablet Take 3 tablets (75 mg total) by mouth at bedtime as needed. 90 tablet 1   ibuprofen  (ADVIL ) 600 MG tablet Take 600 mg by mouth every 6 (six) hours as needed for moderate pain (pain score 4-6).     levothyroxine  (SYNTHROID ) 100 MCG tablet Take 1 tablet (100 mcg total) by mouth daily. 90 tablet 0   metoCLOPramide  (REGLAN ) 10 MG tablet Take 1 tablet (10 mg total) by mouth every 6 (six) hours as needed. 30 tablet 0   NEEDLE, DISP, 18 G 18G X 1 MISC Use as directed 50 each 0   Oxycodone  HCl 10 MG TABS Take by mouth.     pantoprazole  (PROTONIX ) 40 MG tablet Take 1 tablet (40 mg total) by mouth 2 (two) times daily. 180 tablet 1   rosuvastatin  (CRESTOR ) 10 MG tablet Take 1 tablet (10 mg total) by mouth daily. 90 tablet 3   Spacer/Aero-Holding Chambers (AEROCHAMBER MV) inhaler Use as instructed 1 each 0   SYMBICORT  160-4.5 MCG/ACT inhaler INHALE 2 PUFFS BY MOUTH IN THE MORNING AND 2 AT BEDTIME 11 g 11   SYRINGE-NEEDLE, DISP, 3 ML (LUER LOCK SAFETY SYRINGES) 21G X 1-1/2 3 ML MISC Use as directed for testosterone   administration 50 each 0   tadalafil  (CIALIS ) 20 MG tablet Take 1 tablet (20 mg total) by mouth daily. 30 tablet 0   testosterone  cypionate (DEPOTESTOSTERONE CYPIONATE) 200 MG/ML injection Inject 1 mL (200 mg total) into the muscle every 14 (fourteen) days. 4 mL 1   Tiotropium Bromide Monohydrate  (SPIRIVA  RESPIMAT) 2.5 MCG/ACT AERS Inhale 2 puffs into the lungs daily. 60 each 11   triamcinolone  ointment (KENALOG ) 0.5 % Apply 1 Application topically 2 (two) times daily. 30 g 3   verapamil  (CALAN ) 120 MG tablet Take 1 tablet (120 mg total) by mouth 2 (two) times daily. 180 tablet 3   No current facility-administered medications for this visit.   Allergies:   Patient has no known allergies.   Social History:  The patient  reports that he has been smoking cigarettes. He started smoking about 35 years ago. He has a 70.3 pack-year smoking history. He has never used smokeless tobacco. He reports that he does not drink alcohol and does not use drugs.   Family History:   family history includes Alcohol abuse in his father and mother; Cancer - Lung in his mother; Drug abuse in his father,  mother, and sister; Hypertension in his father; Skin cancer in his mother; Stroke in his father.    Review of Systems: Review of Systems  Constitutional: Negative.   HENT: Negative.    Respiratory: Negative.    Cardiovascular: Negative.   Gastrointestinal: Negative.   Musculoskeletal: Negative.   Neurological: Negative.   Psychiatric/Behavioral: Negative.    All other systems reviewed and are negative.  PHYSICAL EXAM: VS:  BP (!) 160/100 (BP Location: Left Arm, Patient Position: Sitting, Cuff Size: Large)   Pulse 99   Ht 5' 11 (1.803 m)   Wt 239 lb 4 oz (108.5 kg)   SpO2 97%   BMI 33.37 kg/m  , BMI Body mass index is 33.37 kg/m. Constitutional:  oriented to person, place, and time. No distress.  HENT:  Head: Grossly normal Eyes:  no discharge. No scleral icterus.  Neck: No JVD, no carotid bruits   Cardiovascular: Regular rate and rhythm, no murmurs appreciated Pulmonary/Chest: Clear to auscultation bilaterally, no wheezes or rails Abdominal: Soft.  no distension.  no tenderness.  Musculoskeletal: Normal range of motion Neurological:  normal muscle tone. Coordination normal. No atrophy Skin: Skin warm and dry Psychiatric: normal affect, pleasant  Recent Labs: 03/14/2023: B Natriuretic Peptide 87.4 11/10/2023: ALT 23 01/14/2024: BUN 21; Creatinine, Ser 1.09; Hemoglobin 15.4; Platelets 206; Potassium 4.1; Sodium 139 02/18/2024: TSH 3.040    Lipid Panel Lab Results  Component Value Date   CHOL 229 (H) 11/10/2023   HDL 42 11/10/2023   LDLCALC 137 (H) 11/10/2023   TRIG 251 (H) 11/10/2023    Wt Readings from Last 3 Encounters:  02/21/24 239 lb 4 oz (108.5 kg)  02/18/24 239 lb (108.4 kg)  02/01/24 234 lb (106.1 kg)    ASSESSMENT AND PLAN:  Frequent headaches -  History of chronic pain and headaches Previously on NSAIDs, pain medication  Hyperlipidemia, unspecified hyperlipidemia type - Goal total cholesterol less than 150, recommend he stay on Crestor  10 daily, add Zetia  10 daily  Aortic atherosclerosis Images pulled up and reviewed with him on CT scan abdomen pelvis Stressed importance of aggressive cholesterol management, smoking cessation  Smoking We have encouraged him to continue to work on weaning his cigarettes and smoking cessation. He will continue to work on this and does not want any assistance with chantix.    Hypertrophic obstructive cardiomyopathy (HCC) - Previous myomectomy,2 cm septal wall,  Prior echocardiogram with high outflow tract gradient especially with Valsalva up to 60 mmHg Taking verapamil  1/2 pill twice daily, off metoprolol  as he reports feeling worse Previously discussed Camzyos.  Previously declined referral to hocm clinic - Repeat echo pending  Essential hypertension Blood pressure elevated today, recommend close monitoring at home May  need to increase verapamil  dosing back to 1 whole pill twice daily  Tachycardia on verapamil  BID Reports he stopped metoprolol  in the p.m. secondary to side effects  Chronic pain syndrome, headaches previously on high-dose narcotics Previously taking NSAIDs including aspirin, Aleve     Orders Placed This Encounter  Procedures   EKG 12-Lead     Signed, Velinda Lunger, M.D., Ph.D. 02/21/2024  [facility] [place], [place] [phone]

## 2024-02-21 ENCOUNTER — Ambulatory Visit: Attending: Cardiovascular Disease | Admitting: Cardiovascular Disease

## 2024-02-21 ENCOUNTER — Encounter: Payer: Self-pay | Admitting: Cardiovascular Disease

## 2024-02-21 VITALS — BP 160/100 | HR 99 | Ht 71.0 in | Wt 239.2 lb

## 2024-02-21 DIAGNOSIS — R0789 Other chest pain: Secondary | ICD-10-CM | POA: Diagnosis not present

## 2024-02-21 DIAGNOSIS — E785 Hyperlipidemia, unspecified: Secondary | ICD-10-CM | POA: Insufficient documentation

## 2024-02-21 DIAGNOSIS — E782 Mixed hyperlipidemia: Secondary | ICD-10-CM | POA: Insufficient documentation

## 2024-02-21 DIAGNOSIS — F17200 Nicotine dependence, unspecified, uncomplicated: Secondary | ICD-10-CM | POA: Insufficient documentation

## 2024-02-21 DIAGNOSIS — I421 Obstructive hypertrophic cardiomyopathy: Secondary | ICD-10-CM | POA: Insufficient documentation

## 2024-02-21 DIAGNOSIS — I10 Essential (primary) hypertension: Secondary | ICD-10-CM | POA: Diagnosis not present

## 2024-02-21 DIAGNOSIS — R0609 Other forms of dyspnea: Secondary | ICD-10-CM | POA: Diagnosis not present

## 2024-02-21 DIAGNOSIS — I2510 Atherosclerotic heart disease of native coronary artery without angina pectoris: Secondary | ICD-10-CM | POA: Insufficient documentation

## 2024-02-21 DIAGNOSIS — R0602 Shortness of breath: Secondary | ICD-10-CM | POA: Diagnosis not present

## 2024-02-21 MED ORDER — EZETIMIBE 10 MG PO TABS: 10.0000 mg | ORAL_TABLET | Freq: Every day | ORAL | 3 refills | Status: AC

## 2024-02-21 NOTE — Patient Instructions (Addendum)
 Medication Instructions:   Please start zetia  10 mg daily with cholesterol  If you need a refill on your cardiac medications before your next appointment, please call your pharmacy.   Lab work: No new labs needed  Testing/Procedures:  Your physician has requested that you have an echocardiogram. Echocardiography is a painless test that uses sound waves to create images of your heart. It provides your doctor with information about the size and shape of your heart and how well your heart's chambers and valves are working.   You may receive an ultrasound enhancing agent through an IV if needed to better visualize your heart during the echo. This procedure takes approximately one hour.  There are no restrictions for this procedure.  This will take place at [address] [facility], [place] [address]  Please note: We ask at that you not bring children with you during ultrasound (echo/ vascular) testing. Due to room size and safety concerns, children are not allowed in the ultrasound rooms during exams. Our front office staff cannot provide observation of children in our lobby area while testing is being conducted. An adult accompanying a patient to their appointment will only be allowed in the ultrasound room at the discretion of the ultrasound technician under special circumstances. We apologize for any inconvenience.   Follow-Up: At [facility], you and your health needs are our priority.  As part of our continuing mission to provide you with exceptional heart care, we have created designated Provider Care Teams.  These Care Teams include your primary Cardiologist (physician) and Advanced Practice Providers (APPs -  Physician Assistants and Nurse Practitioners) who all work together to provide you with the care you need, when you need it.  You will need a follow up appointment in 12 months  Providers on your designated Care Team:   Lonni Meager, NP Bernardino Bring,  PA-C Cadence [place], [place]  COVID-19 Vaccine Information can be found at: [email or web address] For questions related to vaccine distribution or appointments, please email [email or web address] or call [phone].

## 2024-02-24 DIAGNOSIS — F1111 Opioid abuse, in remission: Secondary | ICD-10-CM | POA: Diagnosis not present

## 2024-02-24 DIAGNOSIS — F0634 Mood disorder due to known physiological condition with mixed features: Secondary | ICD-10-CM | POA: Diagnosis not present

## 2024-02-24 DIAGNOSIS — F900 Attention-deficit hyperactivity disorder, predominantly inattentive type: Secondary | ICD-10-CM | POA: Diagnosis not present

## 2024-02-24 DIAGNOSIS — F1411 Cocaine abuse, in remission: Secondary | ICD-10-CM | POA: Diagnosis not present

## 2024-02-24 DIAGNOSIS — Z62819 Personal history of unspecified abuse in childhood: Secondary | ICD-10-CM | POA: Diagnosis not present

## 2024-02-24 DIAGNOSIS — Z599 Problem related to housing and economic circumstances, unspecified: Secondary | ICD-10-CM | POA: Diagnosis not present

## 2024-02-24 DIAGNOSIS — F4323 Adjustment disorder with mixed anxiety and depressed mood: Secondary | ICD-10-CM | POA: Diagnosis not present

## 2024-02-24 DIAGNOSIS — F4312 Post-traumatic stress disorder, chronic: Secondary | ICD-10-CM | POA: Diagnosis not present

## 2024-02-25 DIAGNOSIS — Z419 Encounter for procedure for purposes other than remedying health state, unspecified: Secondary | ICD-10-CM | POA: Diagnosis not present

## 2024-02-28 ENCOUNTER — Ambulatory Visit: Payer: Self-pay | Admitting: Family Medicine

## 2024-03-02 DIAGNOSIS — M5432 Sciatica, left side: Secondary | ICD-10-CM | POA: Diagnosis not present

## 2024-03-02 DIAGNOSIS — M47816 Spondylosis without myelopathy or radiculopathy, lumbar region: Secondary | ICD-10-CM | POA: Diagnosis not present

## 2024-03-02 DIAGNOSIS — G894 Chronic pain syndrome: Secondary | ICD-10-CM | POA: Diagnosis not present

## 2024-03-02 DIAGNOSIS — Z79891 Long term (current) use of opiate analgesic: Secondary | ICD-10-CM | POA: Diagnosis not present

## 2024-03-03 ENCOUNTER — Other Ambulatory Visit: Payer: Self-pay | Admitting: *Deleted

## 2024-03-03 ENCOUNTER — Telehealth: Payer: Self-pay | Admitting: Oncology

## 2024-03-03 DIAGNOSIS — D751 Secondary polycythemia: Secondary | ICD-10-CM

## 2024-03-03 NOTE — Telephone Encounter (Signed)
 Patient called requesting a lab visit. He states his  b12 has been running high and he is taking testosterone  again. Please call [phone]-to schedule

## 2024-03-06 ENCOUNTER — Encounter: Payer: Self-pay | Admitting: Family Medicine

## 2024-03-06 DIAGNOSIS — R1319 Other dysphagia: Secondary | ICD-10-CM | POA: Diagnosis not present

## 2024-03-06 DIAGNOSIS — K5730 Diverticulosis of large intestine without perforation or abscess without bleeding: Secondary | ICD-10-CM | POA: Diagnosis not present

## 2024-03-06 DIAGNOSIS — K921 Melena: Secondary | ICD-10-CM | POA: Diagnosis not present

## 2024-03-06 DIAGNOSIS — R1033 Periumbilical pain: Secondary | ICD-10-CM | POA: Diagnosis not present

## 2024-03-06 DIAGNOSIS — K219 Gastro-esophageal reflux disease without esophagitis: Secondary | ICD-10-CM | POA: Diagnosis not present

## 2024-03-06 DIAGNOSIS — Z9889 Other specified postprocedural states: Secondary | ICD-10-CM | POA: Diagnosis not present

## 2024-03-10 ENCOUNTER — Inpatient Hospital Stay

## 2024-03-10 ENCOUNTER — Inpatient Hospital Stay (HOSPITAL_BASED_OUTPATIENT_CLINIC_OR_DEPARTMENT_OTHER): Admitting: Oncology

## 2024-03-10 ENCOUNTER — Inpatient Hospital Stay: Attending: Oncology

## 2024-03-10 ENCOUNTER — Encounter: Payer: Self-pay | Admitting: Oncology

## 2024-03-10 VITALS — BP 148/88 | HR 77 | Temp 98.6°F | Resp 18 | Ht 71.0 in | Wt 241.0 lb

## 2024-03-10 DIAGNOSIS — D751 Secondary polycythemia: Secondary | ICD-10-CM | POA: Insufficient documentation

## 2024-03-10 DIAGNOSIS — R1033 Periumbilical pain: Secondary | ICD-10-CM | POA: Diagnosis not present

## 2024-03-10 DIAGNOSIS — K921 Melena: Secondary | ICD-10-CM | POA: Diagnosis not present

## 2024-03-10 DIAGNOSIS — F17210 Nicotine dependence, cigarettes, uncomplicated: Secondary | ICD-10-CM | POA: Diagnosis not present

## 2024-03-10 LAB — CBC WITH DIFFERENTIAL/PLATELET
Abs Immature Granulocytes: 0.02 K/uL (ref 0.00–0.07)
Basophils Absolute: 0.1 K/uL (ref 0.0–0.1)
Basophils Relative: 1 %
Eosinophils Absolute: 0.3 K/uL (ref 0.0–0.5)
Eosinophils Relative: 3 %
HCT: 47.9 % (ref 39.0–52.0)
Hemoglobin: 16.5 g/dL (ref 13.0–17.0)
Immature Granulocytes: 0 %
Lymphocytes Relative: 29 %
Lymphs Abs: 2.3 K/uL (ref 0.7–4.0)
MCH: 30.3 pg (ref 26.0–34.0)
MCHC: 34.4 g/dL (ref 30.0–36.0)
MCV: 88.1 fL (ref 80.0–100.0)
Monocytes Absolute: 0.9 K/uL (ref 0.1–1.0)
Monocytes Relative: 11 %
Neutro Abs: 4.4 K/uL (ref 1.7–7.7)
Neutrophils Relative %: 56 %
Platelets: 220 K/uL (ref 150–400)
RBC: 5.44 MIL/uL (ref 4.22–5.81)
RDW: 12.9 % (ref 11.5–15.5)
WBC: 7.9 K/uL (ref 4.0–10.5)
nRBC: 0 % (ref 0.0–0.2)

## 2024-03-10 NOTE — Progress Notes (Signed)
 He said that his lymph node is swollen, for about a week now.

## 2024-03-10 NOTE — Progress Notes (Signed)
No phlebotomy today.

## 2024-03-10 NOTE — Progress Notes (Signed)
 [facility]  [phone] [phone]) [phone]  ID: Jake Elliott OB: 1970/08/14  MR#: [id number]  RDW#:[id number]  Patient Care Team: Donzella Lauraine SAILOR, DO as PCP - General (Family Medicine) Perla Evalene PARAS, MD as PCP - Cardiology (Cardiology) Perla Evalene PARAS, MD as Consulting Physician (Cardiology) Jacobo Evalene PARAS, MD as Consulting Physician (Hematology and Oncology)  CHIEF COMPLAINT: Secondary polycythemia.  INTERVAL HISTORY: Patient last seen in April 2025 and is referred back after reinitiating testosterone .  He currently feels well and is asymptomatic.  He does not complain of any weakness or fatigue.  He denies any recent fevers or illnesses.  He has no neurologic complaints.  He has a good appetite and denies weight loss.  He denies any chest pain, shortness of breath, cough, or hemoptysis.  He denies any nausea, vomiting, constipation, or diarrhea.  He has no urinary complaints.  Patient offers no specific complaints today.   REVIEW OF SYSTEMS:   Review of Systems  Constitutional: Negative.  Negative for fever, malaise/fatigue and weight loss.  HENT:  Negative for congestion.   Respiratory: Negative.  Negative for cough, hemoptysis, shortness of breath and wheezing.   Cardiovascular: Negative.  Negative for chest pain and leg swelling.  Gastrointestinal:  Negative for abdominal pain.  Genitourinary: Negative.  Negative for dysuria.  Musculoskeletal: Negative.  Negative for back pain.  Skin: Negative.  Negative for rash.  Neurological: Negative.  Negative for dizziness, focal weakness, weakness and headaches.  Psychiatric/Behavioral: Negative.  The patient is not nervous/anxious.     As per HPI. Otherwise, a complete review of systems is negative.  PAST MEDICAL HISTORY: Past Medical History:  Diagnosis Date   Angina at rest    Anxiety    Arthritis    Asthma    Attention-deficit/hyperactivity disorder    Bipolar disorder (HCC)     COPD (chronic obstructive pulmonary disease) (HCC)    Depression    GERD (gastroesophageal reflux disease)    GI bleed    Heart murmur    History of tobacco abuse    Hypertension    Hypertrophic cardiomyopathy (HCC)    s/p myomectomy   Peripheral neuropathy    PTSD (post-traumatic stress disorder)     PAST SURGICAL HISTORY: Past Surgical History:  Procedure Laterality Date   CARDIAC CATHETERIZATION     11/14/2010   COLON SURGERY     CORONARY ARTERY BYPASS GRAFT     for myomectomy for hypertrophic cardiomyopathy    FAMILY HISTORY: Family History  Problem Relation Age of Onset   Cancer - Lung Mother    Skin cancer Mother    Alcohol abuse Mother    Drug abuse Mother    Stroke Father    Hypertension Father    Alcohol abuse Father    Drug abuse Father    Drug abuse Sister     ADVANCED DIRECTIVES (Y/N):  N  HEALTH MAINTENANCE: Social History   Tobacco Use   Smoking status: Every Day    Current packs/day: 2.00    Average packs/day: 2.0 packs/day for 35.2 years (70.4 ttl pk-yrs)    Types: Cigarettes    Start date: 12/31/1988   Smokeless tobacco: Never   Tobacco comments:    1 PPD (currently smoking)  Vaping Use   Vaping status: Never Used  Substance Use Topics   Alcohol use: No    Alcohol/week: 0.0 standard drinks of alcohol   Drug use: No     Colonoscopy:  PAP:  Bone density:  Lipid panel:  No Known Allergies  Current Outpatient Medications  Medication Sig Dispense Refill   albuterol  (VENTOLIN  HFA) 108 (90 Base) MCG/ACT inhaler Inhale 2 puffs into the lungs every 6 (six) hours as needed for wheezing or shortness of breath. 8 g 11   Aspirin-Salicylamide-Caffeine (ARTHRITIS STRENGTH BC POWDER PO) Take 1,000 mg by mouth 3 (three) times daily.     cyclobenzaprine  (FLEXERIL ) 10 MG tablet TAKE 1 TABLET BY MOUTH THREE TIMES DAILY AS NEEDED FOR MUSCLE SPASM (THIS  CAN  MAKE  YOU  SLEEPY) 90 tablet 1   dextroamphetamine  (DEXTROSTAT ) 10 MG tablet Take 10 mg by  mouth daily.     diphenhydrAMINE  (BENADRYL ) 25 MG tablet Take 3 tablets (75 mg total) by mouth at bedtime as needed. 90 tablet 1   ezetimibe  (ZETIA ) 10 MG tablet Take 1 tablet (10 mg total) by mouth daily. 90 tablet 3   ibuprofen  (ADVIL ) 600 MG tablet Take 600 mg by mouth every 6 (six) hours as needed for moderate pain (pain score 4-6).     levothyroxine  (SYNTHROID ) 100 MCG tablet Take 1 tablet (100 mcg total) by mouth daily. 90 tablet 0   metoCLOPramide  (REGLAN ) 10 MG tablet Take 1 tablet (10 mg total) by mouth every 6 (six) hours as needed. 30 tablet 0   NEEDLE, DISP, 18 G 18G X 1 MISC Use as directed 50 each 0   Oxycodone  HCl 10 MG TABS Take by mouth.     pantoprazole  (PROTONIX ) 40 MG tablet Take 1 tablet (40 mg total) by mouth 2 (two) times daily. 180 tablet 1   rosuvastatin  (CRESTOR ) 10 MG tablet Take 1 tablet (10 mg total) by mouth daily. 90 tablet 3   Spacer/Aero-Holding Chambers (AEROCHAMBER MV) inhaler Use as instructed 1 each 0   SYMBICORT  160-4.5 MCG/ACT inhaler INHALE 2 PUFFS BY MOUTH IN THE MORNING AND 2 AT BEDTIME 11 g 11   SYRINGE-NEEDLE, DISP, 3 ML (LUER LOCK SAFETY SYRINGES) 21G X 1-1/2 3 ML MISC Use as directed for testosterone  administration 50 each 0   tadalafil  (CIALIS ) 20 MG tablet Take 1 tablet (20 mg total) by mouth daily. 30 tablet 0   testosterone  cypionate (DEPOTESTOSTERONE CYPIONATE) 200 MG/ML injection Inject 1 mL (200 mg total) into the muscle every 14 (fourteen) days. 4 mL 1   Tiotropium Bromide Monohydrate  (SPIRIVA  RESPIMAT) 2.5 MCG/ACT AERS Inhale 2 puffs into the lungs daily. 60 each 11   triamcinolone  ointment (KENALOG ) 0.5 % Apply 1 Application topically 2 (two) times daily. 30 g 3   verapamil  (CALAN ) 120 MG tablet Take 1 tablet (120 mg total) by mouth 2 (two) times daily. 180 tablet 3   No current facility-administered medications for this visit.    OBJECTIVE: Vitals:   03/10/24 0922  BP: (!) 148/88  Pulse: 77  Resp: 18  Temp: 98.6 F (37 C)  SpO2:  98%     Body mass index is 33.61 kg/m.    ECOG FS:0 - Asymptomatic  General: Well-developed, well-nourished, no acute distress. Eyes: Pink conjunctiva, anicteric sclera. HEENT: Normocephalic, moist mucous membranes. Lungs: No audible wheezing or coughing. Heart: Regular rate and rhythm. Abdomen: Soft, nontender, no obvious distention. Musculoskeletal: No edema, cyanosis, or clubbing. Neuro: Alert, answering all questions appropriately. Cranial nerves grossly intact. Skin: No rashes or petechiae noted. Psych: Normal affect.  LAB RESULTS:  Lab Results  Component Value Date   NA 139 01/14/2024   K 4.1 01/14/2024   CL 106 01/14/2024   CO2 23 01/14/2024  GLUCOSE 99 01/14/2024   BUN 21 (H) 01/14/2024   CREATININE 1.09 01/14/2024   CALCIUM  9.2 01/14/2024   PROT 6.3 (L) 11/10/2023   ALBUMIN 4.0 11/10/2023   AST 19 11/10/2023   ALT 23 11/10/2023   ALKPHOS 58 11/10/2023   BILITOT 0.6 11/10/2023   GFRNONAA >60 01/14/2024   GFRAA >60 03/09/2020    Lab Results  Component Value Date   WBC 7.9 03/10/2024   NEUTROABS 4.4 03/10/2024   HGB 16.5 03/10/2024   HCT 47.9 03/10/2024   MCV 88.1 03/10/2024   PLT 220 03/10/2024     STUDIES: No results found.   ASSESSMENT: Secondary polycythemia.  PLAN:    Secondary polycythemia: Patient has now reinitiated testosterone  supplementation and his hemoglobin has trended up slightly to 16.5.  Previously, he also had an elevated carbon monoxide level, but the remainder of his laboratory work including iron stores, erythropoietin  levels, and JAK2 mutation with reflex are either negative or within normal limits. Despite trending up, his hemoglobin is still below goal at 17.0 therefore he does not require phlebotomy today.  No further intervention is needed.  Return to clinic in 3 months with repeat laboratory work, further evaluation, and consideration of additional phlebotomy. Low testosterone : Okay from a hematology standpoint to continue  testosterone .  Hypertension: Chronic and unchanged.  Continue monitoring and treatment per primary care.   Patient expressed understanding and was in agreement with this plan. He also understands that He can call clinic at any time with any questions, concerns, or complaints.    Evalene JINNY Reusing, MD   03/10/2024 1:56 PM

## 2024-03-14 ENCOUNTER — Other Ambulatory Visit: Payer: Self-pay

## 2024-03-14 ENCOUNTER — Encounter: Payer: Self-pay | Admitting: Emergency Medicine

## 2024-03-14 ENCOUNTER — Emergency Department
Admission: EM | Admit: 2024-03-14 | Discharge: 2024-03-15 | Disposition: A | Discharge status: Home / Self Care | Attending: Emergency Medicine | Admitting: Emergency Medicine

## 2024-03-14 DIAGNOSIS — I10 Essential (primary) hypertension: Secondary | ICD-10-CM | POA: Insufficient documentation

## 2024-03-14 DIAGNOSIS — R5383 Other fatigue: Secondary | ICD-10-CM | POA: Diagnosis not present

## 2024-03-14 DIAGNOSIS — Z951 Presence of aortocoronary bypass graft: Secondary | ICD-10-CM | POA: Insufficient documentation

## 2024-03-14 DIAGNOSIS — R590 Localized enlarged lymph nodes: Secondary | ICD-10-CM | POA: Diagnosis not present

## 2024-03-14 DIAGNOSIS — F17200 Nicotine dependence, unspecified, uncomplicated: Secondary | ICD-10-CM | POA: Insufficient documentation

## 2024-03-14 DIAGNOSIS — J4489 Other specified chronic obstructive pulmonary disease: Secondary | ICD-10-CM | POA: Insufficient documentation

## 2024-03-14 LAB — CBC WITH DIFFERENTIAL/PLATELET
Abs Immature Granulocytes: 0.02 K/uL (ref 0.00–0.07)
Basophils Absolute: 0.1 K/uL (ref 0.0–0.1)
Basophils Relative: 1 %
Eosinophils Absolute: 0.2 K/uL (ref 0.0–0.5)
Eosinophils Relative: 2 %
HCT: 50.1 % (ref 39.0–52.0)
Hemoglobin: 16.9 g/dL (ref 13.0–17.0)
Immature Granulocytes: 0 %
Lymphocytes Relative: 22 %
Lymphs Abs: 1.8 K/uL (ref 0.7–4.0)
MCH: 29.8 pg (ref 26.0–34.0)
MCHC: 33.7 g/dL (ref 30.0–36.0)
MCV: 88.4 fL (ref 80.0–100.0)
Monocytes Absolute: 0.6 K/uL (ref 0.1–1.0)
Monocytes Relative: 8 %
Neutro Abs: 5.5 K/uL (ref 1.7–7.7)
Neutrophils Relative %: 67 %
Platelets: 218 K/uL (ref 150–400)
RBC: 5.67 MIL/uL (ref 4.22–5.81)
RDW: 12.5 % (ref 11.5–15.5)
WBC: 8.1 K/uL (ref 4.0–10.5)
nRBC: 0 % (ref 0.0–0.2)

## 2024-03-14 LAB — COMPREHENSIVE METABOLIC PANEL WITH GFR
ALT: 30 U/L (ref 0–44)
AST: 29 U/L (ref 15–41)
Albumin: 4.1 g/dL (ref 3.5–5.0)
Alkaline Phosphatase: 51 U/L (ref 38–126)
Anion gap: 12 (ref 5–15)
BUN: 16 mg/dL (ref 6–20)
CO2: 23 mmol/L (ref 22–32)
Calcium: 9 mg/dL (ref 8.9–10.3)
Chloride: 103 mmol/L (ref 98–111)
Creatinine, Ser: 1.05 mg/dL (ref 0.61–1.24)
GFR, Estimated: >60 mL/min (ref >60)
Glucose, Bld: 112 mg/dL — ABNORMAL HIGH (ref 70–99)
Potassium: 4.1 mmol/L (ref 3.5–5.1)
Sodium: 138 mmol/L (ref 135–145)
Total Bilirubin: 0.8 mg/dL (ref 0.0–1.2)
Total Protein: 6.7 g/dL (ref 6.5–8.1)

## 2024-03-14 NOTE — ED Triage Notes (Addendum)
 Pt arrives POV, ambulatory to triage, no acute distress noted, c/o weakness and generally not feeling well over the past 2 weeks. Pt wanting to blood work done. Pt reports BP of 258/88 @ home; denies CP and SOB

## 2024-03-15 LAB — URINALYSIS, ROUTINE W REFLEX MICROSCOPIC
Bacteria, UA: NONE SEEN
Bilirubin Urine: NEGATIVE
Glucose, UA: NEGATIVE mg/dL
Hgb urine dipstick: NEGATIVE
Ketones, ur: NEGATIVE mg/dL
Leukocytes,Ua: NEGATIVE
Nitrite: NEGATIVE
Protein, ur: NEGATIVE mg/dL
RBC / HPF: 0 RBC/hpf (ref 0–5)
Specific Gravity, Urine: 1.028 (ref 1.005–1.030)
WBC, UA: 0 WBC/hpf (ref 0–5)
pH: 5 (ref 5.0–8.0)

## 2024-03-15 LAB — TSH: TSH: 2.623 u[IU]/mL (ref 0.350–4.500)

## 2024-03-15 NOTE — ED Provider Notes (Signed)
 [facility] Provider Note    Event Date/Time   First MD Initiated Contact with Patient 03/14/24 2347     (approximate)   History   Chief Complaint: Weakness   HPI  Jake Elliott is a 53 y.o. male with a history of anxiety, bipolar disorder, hypertension who comes ED complaining of fatigue for the past 2 weeks, associated with tender enlarged lymph nodes of the neck.  No chest pain shortness of breath, abdominal pain vomiting diarrhea or fever.  Denies sore throat        Past Medical History:  Diagnosis Date   Angina at rest    Anxiety    Arthritis    Asthma    Attention-deficit/hyperactivity disorder    Bipolar disorder (HCC)    COPD (chronic obstructive pulmonary disease) (HCC)    Depression    GERD (gastroesophageal reflux disease)    GI bleed    Heart murmur    History of tobacco abuse    Hypertension    Hypertrophic cardiomyopathy (HCC)    s/p myomectomy   Peripheral neuropathy    PTSD (post-traumatic stress disorder)     Current Outpatient Rx   Order #: [id number] Class: Normal   Order #: [id number] Class: Historical Med   Order #: [id number] Class: Normal   Order #: [id number] Class: Historical Med   Order #: [id number] Class: No Print   Order #: [id number] Class: Normal   Order #: [id number] Class: Historical Med   Order #: [id number] Class: Normal   Order #: [id number] Class: Normal   Order #: [id number] Class: Normal   Order #: [id number] Class: Historical Med   Order #: [id number] Class: Normal   Order #: [id number] Class: Normal   Order #: [id number] Class: Normal   Order #: [id number] Class: Normal   Order #: [id number] Class: Normal   Order #: [id number] Class: Normal   Order #: [id number] Class: Normal   Order #: [id number] Class: Normal   Order #: [id number] Class: Normal   Order #: [id number] Class: Normal    Past Surgical History:  Procedure Laterality Date   CARDIAC CATHETERIZATION     11/14/2010   COLON SURGERY     CORONARY ARTERY  BYPASS GRAFT     for myomectomy for hypertrophic cardiomyopathy    Physical Exam   Triage Vital Signs: ED Triage Vitals  Encounter Vitals Group     BP 03/14/24 2225 (!) 178/118     Girls Systolic BP Percentile --      Girls Diastolic BP Percentile --      Boys Systolic BP Percentile --      Boys Diastolic BP Percentile --      Pulse Rate 03/14/24 2225 81     Resp 03/14/24 2225 18     Temp 03/14/24 2225 98.5 F (36.9 C)     Temp Source 03/14/24 2225 Oral     SpO2 03/14/24 2225 99 %     Weight 03/14/24 2226 240 lb 15.4 oz (109.3 kg)     Height 03/14/24 2226 5' 11 (1.803 m)     Head Circumference --      Peak Flow --      Pain Score 03/14/24 2226 0     Pain Loc --      Pain Education --      Exclude from Growth Chart --     Most recent vital signs: Vitals:   03/14/24 2225 03/15/24 0000  BP: (!) 178/118 (!) 162/103  Pulse: 81 63  Resp: 18 (!) 21  Temp: 98.5 F (36.9 C)   SpO2:  99% 99%    General: Awake, no distress.  CV:  Good peripheral perfusion.  Regular rate rhythm Resp:  Normal effort.  Clear lungs Abd:  No distention.  Soft nontender Other:  Declines to allow oropharyngeal exam   ED Results / Procedures / Treatments   Labs (all labs ordered are listed, but only abnormal results are displayed) Labs Reviewed  COMPREHENSIVE METABOLIC PANEL WITH GFR - Abnormal; Notable for the following components:      Result Value   Glucose, Bld 112 (*)    All other components within normal limits  URINALYSIS, ROUTINE W REFLEX MICROSCOPIC - Abnormal; Notable for the following components:   Color, Urine AMBER (*)    APPearance TURBID (*)    All other components within normal limits  CBC WITH DIFFERENTIAL/PLATELET  TSH     EKG    RADIOLOGY    PROCEDURES:  Procedures   MEDICATIONS ORDERED IN ED: Medications - No data to display   IMPRESSION / MDM / ASSESSMENT AND PLAN / ED COURSE  I reviewed the triage vital signs and the nursing notes.  DDx: Viral  illness, reactive lymphadenopathy, anemia, hyperthyroidism, electrolyte derangement, AKI  Patient's presentation is most consistent with acute presentation with potential threat to life or bodily function.     Clinical Course as of 03/15/24 0311  Wed Mar 15, 2024  0030 Presents with fatigue for the last 2 weeks in the setting of cervical lymphadenopathy which is subsiding but still present.  Vital signs unremarkable except for uncontrolled hypertension.  Lab workup reassuring.  Exam benign.  Patient declines additional antihypertensives currently because he is about to start treatment for H. pylori and does not want to add additional new medicines right now.  He will follow-up with his doctor for blood pressure recheck. [PS]    Clinical Course User Index [PS] Viviann Pastor, MD     FINAL CLINICAL IMPRESSION(S) / ED DIAGNOSES   Final diagnoses:  Fatigue, unspecified type  Uncontrolled hypertension  Cervical lymphadenopathy     Rx / DC Orders   ED Discharge Orders     None        Note:  This document was prepared using Dragon voice recognition software and may include unintentional dictation errors.   Viviann Pastor, MD 03/15/24 [phone]

## 2024-03-16 ENCOUNTER — Encounter: Payer: Self-pay | Admitting: Urology

## 2024-03-21 ENCOUNTER — Other Ambulatory Visit: Payer: Self-pay

## 2024-03-21 DIAGNOSIS — E291 Testicular hypofunction: Secondary | ICD-10-CM

## 2024-03-22 ENCOUNTER — Encounter: Payer: Self-pay | Admitting: Urology

## 2024-03-23 ENCOUNTER — Other Ambulatory Visit

## 2024-03-24 ENCOUNTER — Other Ambulatory Visit

## 2024-03-24 DIAGNOSIS — E291 Testicular hypofunction: Secondary | ICD-10-CM | POA: Diagnosis not present

## 2024-03-25 LAB — TESTOSTERONE: Testosterone: 779 ng/dL (ref 264–916)

## 2024-03-26 ENCOUNTER — Ambulatory Visit: Payer: Self-pay | Admitting: Urology

## 2024-03-28 ENCOUNTER — Other Ambulatory Visit: Payer: Self-pay | Admitting: Internal Medicine

## 2024-03-28 DIAGNOSIS — K921 Melena: Secondary | ICD-10-CM

## 2024-03-28 DIAGNOSIS — R1033 Periumbilical pain: Secondary | ICD-10-CM

## 2024-03-28 DIAGNOSIS — K219 Gastro-esophageal reflux disease without esophagitis: Secondary | ICD-10-CM

## 2024-03-30 ENCOUNTER — Ambulatory Visit: Attending: Cardiovascular Disease

## 2024-03-30 ENCOUNTER — Ambulatory Visit
Admission: RE | Admit: 2024-03-30 | Discharge: 2024-03-30 | Disposition: A | Discharge status: Home / Self Care | Source: Ambulatory Visit | Attending: Internal Medicine | Admitting: Internal Medicine

## 2024-03-30 DIAGNOSIS — R109 Unspecified abdominal pain: Secondary | ICD-10-CM | POA: Diagnosis not present

## 2024-03-30 DIAGNOSIS — K219 Gastro-esophageal reflux disease without esophagitis: Secondary | ICD-10-CM | POA: Diagnosis not present

## 2024-03-30 DIAGNOSIS — K921 Melena: Secondary | ICD-10-CM | POA: Diagnosis not present

## 2024-03-30 DIAGNOSIS — I421 Obstructive hypertrophic cardiomyopathy: Secondary | ICD-10-CM

## 2024-03-30 DIAGNOSIS — R1033 Periumbilical pain: Secondary | ICD-10-CM | POA: Diagnosis not present

## 2024-03-30 DIAGNOSIS — K449 Diaphragmatic hernia without obstruction or gangrene: Secondary | ICD-10-CM | POA: Diagnosis not present

## 2024-03-30 DIAGNOSIS — K222 Esophageal obstruction: Secondary | ICD-10-CM | POA: Diagnosis not present

## 2024-03-30 LAB — ECHOCARDIOGRAM COMPLETE
AV Mean grad: 9 mmHg
AV Peak grad: 13.1 mmHg
Ao pk vel: 1.81 m/s
Area-P 1/2: 3.63 cm2
S' Lateral: 2.2 cm

## 2024-04-01 ENCOUNTER — Ambulatory Visit: Payer: Self-pay | Admitting: Cardiovascular Disease

## 2024-04-03 DIAGNOSIS — I421 Obstructive hypertrophic cardiomyopathy: Secondary | ICD-10-CM

## 2024-04-10 DIAGNOSIS — G894 Chronic pain syndrome: Secondary | ICD-10-CM | POA: Diagnosis not present

## 2024-04-10 DIAGNOSIS — M5432 Sciatica, left side: Secondary | ICD-10-CM | POA: Diagnosis not present

## 2024-04-10 DIAGNOSIS — Z79891 Long term (current) use of opiate analgesic: Secondary | ICD-10-CM | POA: Diagnosis not present

## 2024-04-10 DIAGNOSIS — M47816 Spondylosis without myelopathy or radiculopathy, lumbar region: Secondary | ICD-10-CM | POA: Diagnosis not present

## 2024-04-13 ENCOUNTER — Telehealth: Payer: Self-pay | Admitting: Cardiovascular Disease

## 2024-04-13 DIAGNOSIS — E785 Hyperlipidemia, unspecified: Secondary | ICD-10-CM

## 2024-04-13 DIAGNOSIS — Z79899 Other long term (current) drug therapy: Secondary | ICD-10-CM

## 2024-04-13 NOTE — Telephone Encounter (Signed)
 Called and spoke with patient. Patient states that he started Zetia  about 2 months ago. Patient says that he started having swollen salivary glands about the same time. Patient states that he believes the swollen glands are from Zetia . Recommended patient holding Zetia  to see if there is improvement in symptoms. Patient states that he does not want to stop Zetia  that he would like a lipid panel to see if his labs have improved with Zetia . Will forward to provider for further recommendations.

## 2024-04-13 NOTE — Telephone Encounter (Signed)
 Pt c/o medication issue:  1. Name of Medication: ezetimibe  (ZETIA ) 10 MG tablet   2. How are you currently taking this medication (dosage and times per day)?   Take 1 tablet (10 mg total) by mouth daily.    3. Are you having a reaction (difficulty breathing--STAT)? Yes   4. What is your medication issue? Swollen salivary glands, blisters on legs, dermatitis and irritation on face. Pt feels this may be related to the medication. Pt is requesting lab orders. Please advise.   Call transferred

## 2024-04-17 NOTE — Telephone Encounter (Signed)
Called patient. No answer and unable to leave message

## 2024-04-18 ENCOUNTER — Encounter: Payer: Self-pay | Admitting: Cardiovascular Disease

## 2024-04-18 NOTE — Telephone Encounter (Signed)
 Spoke to pt and message from TG relayed, pt does not want to stop the Zetia  (despite the reported reaction) if it's improving his cholesterol  As per MD and conversation with pt: order placed [facility]

## 2024-04-18 NOTE — Addendum Note (Signed)
 Addended by: HANNAH MARCUS KIDD on: 04/18/2024 12:16 PM   Modules accepted: Orders

## 2024-04-18 NOTE — Telephone Encounter (Signed)
 Patient is returning call.

## 2024-04-21 ENCOUNTER — Other Ambulatory Visit
Admission: RE | Admit: 2024-04-21 | Discharge: 2024-04-21 | Disposition: A | Discharge status: Home / Self Care | Attending: Cardiovascular Disease | Admitting: Cardiovascular Disease

## 2024-04-21 DIAGNOSIS — E785 Hyperlipidemia, unspecified: Secondary | ICD-10-CM | POA: Diagnosis not present

## 2024-04-21 DIAGNOSIS — Z79899 Other long term (current) drug therapy: Secondary | ICD-10-CM | POA: Insufficient documentation

## 2024-04-21 LAB — LIPID PANEL
Cholesterol: 100 mg/dL (ref 0–200)
HDL: 43 mg/dL (ref >40)
LDL Cholesterol: 51 mg/dL (ref 0–99)
Total CHOL/HDL Ratio: 2.3 ratio
Triglycerides: 28 mg/dL (ref <150)
VLDL: 6 mg/dL (ref 0–40)

## 2024-04-24 ENCOUNTER — Other Ambulatory Visit: Payer: Self-pay | Admitting: Pulmonary Disease

## 2024-04-24 DIAGNOSIS — J439 Emphysema, unspecified: Secondary | ICD-10-CM

## 2024-04-29 ENCOUNTER — Ambulatory Visit: Payer: Self-pay | Admitting: Cardiovascular Disease

## 2024-05-03 ENCOUNTER — Encounter: Payer: Self-pay | Admitting: Student in an Organized Health Care Education/Training Program

## 2024-05-03 ENCOUNTER — Ambulatory Visit: Admitting: Student in an Organized Health Care Education/Training Program

## 2024-05-03 VITALS — BP 140/92 | HR 75 | Temp 98.1°F | Ht 71.0 in | Wt 243.8 lb

## 2024-05-03 DIAGNOSIS — J398 Other specified diseases of upper respiratory tract: Secondary | ICD-10-CM

## 2024-05-03 DIAGNOSIS — J4540 Moderate persistent asthma, uncomplicated: Secondary | ICD-10-CM | POA: Diagnosis not present

## 2024-05-03 DIAGNOSIS — J439 Emphysema, unspecified: Secondary | ICD-10-CM

## 2024-05-03 DIAGNOSIS — F17210 Nicotine dependence, cigarettes, uncomplicated: Secondary | ICD-10-CM | POA: Diagnosis not present

## 2024-05-03 DIAGNOSIS — J449 Chronic obstructive pulmonary disease, unspecified: Secondary | ICD-10-CM

## 2024-05-03 DIAGNOSIS — R053 Chronic cough: Secondary | ICD-10-CM | POA: Diagnosis not present

## 2024-05-03 DIAGNOSIS — F17200 Nicotine dependence, unspecified, uncomplicated: Secondary | ICD-10-CM

## 2024-05-03 MED ORDER — VENTOLIN HFA 108 (90 BASE) MCG/ACT IN AERS: 1.0000 | INHALATION_SPRAY | Freq: Four times a day (QID) | RESPIRATORY_TRACT | 11 refills | Status: AC | PRN

## 2024-05-03 MED ORDER — SYMBICORT 160-4.5 MCG/ACT IN AERO: 2.0000 | INHALATION_SPRAY | Freq: Two times a day (BID) | RESPIRATORY_TRACT | 11 refills | Status: AC

## 2024-05-03 MED ORDER — SPIRIVA RESPIMAT 2.5 MCG/ACT IN AERS: 2.0000 | INHALATION_SPRAY | Freq: Every day | RESPIRATORY_TRACT | 12 refills | Status: AC

## 2024-05-03 NOTE — Progress Notes (Signed)
 Assessment & Plan:   #Moderate persistent asthma #Chronic Cough #Asthma/COPD Overlap #Tracheobronchomalacia  Patient is presenting for follow-up of cough that has been persistent. Exam remains reassuring without cough. CT imaging with emphysema and signs of bronchomalacia (07/2023). FENO previously re-assuring in clinic.  PFT's showed an obstructive defect with significant response to bronchodilator challenge. FEV1/FVC ratio postbronchodilator challenge was 0.77 arguing for asthma and against COPD, asthma/COPD overlap is also a consideration given the emphysema noted on imaging.  Lung volumes are within normal while DLCO is very mildly reduced at 73% of predicted. Also on the differential for the patient's cough is EDAC/TBM.  Started on ICS/LABA (Symbicort ) + LAMA (Spiriva ) with improvement, but not full resolution of symptoms. He had his dynamic airway CT that showed less than 50% colapse in the trachea per report. I have personally reviewed his dynamic CT and evaluated the airway including the mainstem and segmental bronchi. While the report doesn't comment on bronchomalacia, the appearance of the airways on expiratory films is highly suggestive of bronchomalacia that is likely exacerbating his symptoms. Management is supportive at this point (pulmonary clearance, purse lip breathing, inhalers, smoking cessation).  - SYMBICORT  160-4.5 MCG/ACT inhaler; Inhale 2 puffs into the lungs in the morning and at bedtime.  Dispense: 11 g; Refill: 11 - Tiotropium Bromide (SPIRIVA  RESPIMAT) 2.5 MCG/ACT AERS; Inhale 2 puffs into the lungs daily.  Dispense: 4 g; Refill: 12 - VENTOLIN  HFA 108 (90 Base) MCG/ACT inhaler; Inhale 1-2 puffs into the lungs every 6 (six) hours as needed for wheezing or shortness of breath.  Dispense: 18 g; Refill: 11  #Thrombus of pulmonary vein  Patient was incidentally found to have a pulmonary vein thrombus on imaging in the past. No clear consensus guidelines on management of  pulmonary vein thrombosus but I did discuss my recommendation to continue anti-coagulation with Mr. Diodato in the past. He wanted to come off Eliquis  and hasn't restarted it since a prior visit.  #Tobacco Use Disorder  Continues to smoke - counseled on the importance of smoking cessation and effects of his smoking on his health and respiratory symptoms.   Return in about 1 year (around 05/03/2025).  Belva November, MD [facility]  I spent 30 minutes caring for this patient today, including preparing to see the patient, obtaining a medical history , reviewing a separately obtained history, performing a medically appropriate examination and/or evaluation, counseling and educating the patient/family/caregiver, ordering medications, tests, or procedures, documenting clinical information in the electronic health record, and independently interpreting results (not separately reported/billed) and communicating results to the patient/family/caregiver  End of visit medications:  Meds ordered this encounter  Medications   SYMBICORT  160-4.5 MCG/ACT inhaler    Sig: Inhale 2 puffs into the lungs in the morning and at bedtime.    Dispense:  11 g    Refill:  11   Tiotropium Bromide (SPIRIVA  RESPIMAT) 2.5 MCG/ACT AERS    Sig: Inhale 2 puffs into the lungs daily.    Dispense:  4 g    Refill:  12   VENTOLIN  HFA 108 (90 Base) MCG/ACT inhaler    Sig: Inhale 1-2 puffs into the lungs every 6 (six) hours as needed for wheezing or shortness of breath.    Dispense:  18 g    Refill:  11     Current Outpatient Medications:    Aspirin-Salicylamide-Caffeine (ARTHRITIS STRENGTH BC POWDER PO), Take 1,000 mg by mouth 3 (three) times daily., Disp: , Rfl:    cyclobenzaprine  (  FLEXERIL ) 10 MG tablet, TAKE 1 TABLET BY MOUTH THREE TIMES DAILY AS NEEDED FOR MUSCLE SPASM (THIS  CAN  MAKE  YOU  SLEEPY), Disp: 90 tablet, Rfl: 1   dextroamphetamine  (DEXTROSTAT ) 10 MG tablet, Take 10 mg by mouth daily.,  Disp: , Rfl:    diphenhydrAMINE  (BENADRYL ) 25 MG tablet, Take 3 tablets (75 mg total) by mouth at bedtime as needed., Disp: 90 tablet, Rfl: 1   ezetimibe  (ZETIA ) 10 MG tablet, Take 1 tablet (10 mg total) by mouth daily., Disp: 90 tablet, Rfl: 3   ibuprofen  (ADVIL ) 600 MG tablet, Take 600 mg by mouth every 6 (six) hours as needed for moderate pain (pain score 4-6)., Disp: , Rfl:    levothyroxine  (SYNTHROID ) 100 MCG tablet, Take 1 tablet (100 mcg total) by mouth daily., Disp: 90 tablet, Rfl: 0   metoCLOPramide  (REGLAN ) 10 MG tablet, Take 1 tablet (10 mg total) by mouth every 6 (six) hours as needed., Disp: 30 tablet, Rfl: 0   NEEDLE, DISP, 18 G 18G X 1 MISC, Use as directed, Disp: 50 each, Rfl: 0   Oxycodone  HCl 10 MG TABS, Take by mouth., Disp: , Rfl:    pantoprazole  (PROTONIX ) 40 MG tablet, Take 1 tablet (40 mg total) by mouth 2 (two) times daily., Disp: 180 tablet, Rfl: 1   rosuvastatin  (CRESTOR ) 10 MG tablet, Take 1 tablet (10 mg total) by mouth daily., Disp: 90 tablet, Rfl: 3   Spacer/Aero-Holding Chambers (AEROCHAMBER MV) inhaler, Use as instructed, Disp: 1 each, Rfl: 0   SYRINGE-NEEDLE, DISP, 3 ML (LUER LOCK SAFETY SYRINGES) 21G X 1-1/2 3 ML MISC, Use as directed for testosterone  administration, Disp: 50 each, Rfl: 0   tadalafil  (CIALIS ) 20 MG tablet, Take 1 tablet (20 mg total) by mouth daily., Disp: 30 tablet, Rfl: 0   testosterone  cypionate (DEPOTESTOSTERONE CYPIONATE) 200 MG/ML injection, Inject 1 mL (200 mg total) into the muscle every 14 (fourteen) days., Disp: 4 mL, Rfl: 1   triamcinolone  ointment (KENALOG ) 0.5 %, Apply 1 Application topically 2 (two) times daily., Disp: 30 g, Rfl: 3   verapamil  (CALAN ) 120 MG tablet, Take 1 tablet (120 mg total) by mouth 2 (two) times daily., Disp: 180 tablet, Rfl: 3   SYMBICORT  160-4.5 MCG/ACT inhaler, Inhale 2 puffs into the lungs in the morning and at bedtime., Disp: 11 g, Rfl: 11   Tiotropium Bromide (SPIRIVA  RESPIMAT) 2.5 MCG/ACT AERS, Inhale 2  puffs into the lungs daily., Disp: 4 g, Rfl: 12   VENTOLIN  HFA 108 (90 Base) MCG/ACT inhaler, Inhale 1-2 puffs into the lungs every 6 (six) hours as needed for wheezing or shortness of breath., Disp: 18 g, Rfl: 11   Subjective:   PATIENT ID: Oneil Jake Elliott Brake GENDER: male DOB: 10-10-1970, MRN: [id number]  Chief Complaint  Patient presents with   Medical Management of Chronic Issues    Dry cough. Shortness of breath on exertion.  No wheezing.    HPI  Mr. Mehring is a pleasant 53 year old man here for followup.  Return Visit 08/11/2023:   He is continued on triple therapy which he feels has been helpful. He is using tiotropium one puff bid which he felt helped his symptoms better than two puffs twice daily. Continues to have a cough but this is less pronounced after initiation of Spiriva . Cough continues to be episodic and in bouts/fits. Cough is mostly dry, with occasional white sputum. He reports also using the albuterol  inhaler with some improvement. Continues to deny fever/chills/night sweats. He  continues to be somewhat short of breath with exertion, but better than prior. He is here to discuss the result from his dynamic airway CT.   Patient was in the ED in January of 2024 secondary to acute cough. During his ED stay, blood work and viral testing were normal/negative, while a CTA of the chest was notable for a small thrombus in the lower branch of the left pulmonary vein. He was discharged on Eliquis  and asked to follow up with pulmonology. He requested to be discontinued off Eliquis  during a prior visit and has been off of it. His last visit to the ED was in September of 2024 for chest pain with reassuring workup. He follows with cardiology as well as Dr. Jacobo from oncology for polycythemia.  Return Visit 05/03/2204:  Presents for follow up with stable respiratory symptoms. Cough persists, but is less pronounced and no longer productive. He feels it's like a goose sound. No  sputum production reported. He is using his inhalers mostly regularly, but does report not taking them daily. He feels they sometimes make his cough worse. He has Spiriva  and Symbicort , and albuterol  as needed. He unfortunately continues to smoke cigarettes. He did try vape use but felt worse with it so returned to smoking cigarettes.   Patient unfortunately continues to smoke, with around 15 to 20 pack years of smoking history. He denies any current illicit drug use. 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    CT Chest 10/09/2022   Lungs/Pleura: Moderate emphysematous changes are again seen. There are minimal atelectatic changes in both lung bases. There is a 4 mm nodule in the left lower lobe image 9/91 which is unchanged from prior. No pleural effusion or pneumothorax. Trachea and central airways are patent.  CT High Resolution 08/09/2023:  1. Slightly less than 50% collapsibility of the trachea on expiratory phase imaging which argues against excessive dynamic airway collapse. 2. No evidence of interstitial lung disease. 3. Emphysema   Ancillary information including prior medications, full medical/surgical/family/social histories, and PFTs (when available) are listed below and have been reviewed.   Review of Systems  Constitutional:  Negative for chills and fever.  Respiratory:  Positive for cough. Negative for hemoptysis, sputum production, shortness of breath and wheezing.   Cardiovascular:  Negative for chest pain.     Objective:   Vitals:   05/03/24 1601  BP: (!) 140/92  Pulse: 75  Temp: 98.1 F (36.7 C)  TempSrc: Temporal  SpO2: 98%  Weight: 243 lb 12.8 oz (110.6 kg)  Height: 5' 11 (1.803 m)   98% on RA  BMI Readings from Last 3 Encounters:  05/03/24 34.00 kg/m  03/14/24 33.61 kg/m  03/10/24 33.61 kg/m   Wt Readings from Last 3 Encounters:  05/03/24 243 lb 12.8 oz (110.6 kg)  03/14/24 240 lb 15.4 oz (109.3 kg)   03/10/24 241 lb (109.3 kg)     Physical Exam Constitutional:      Appearance: Normal appearance.  Cardiovascular:     Rate and Rhythm: Normal rate and regular rhythm.     Pulses: Normal pulses.     Heart sounds: Normal heart sounds.  Pulmonary:     Effort: Pulmonary effort is normal.     Breath sounds: Normal breath sounds. No wheezing or rales.  Neurological:     General: No focal deficit present.     Mental Status: He is alert and oriented to person, place, and time.  Mental status is at baseline.       Ancillary Information    Past Medical History:  Diagnosis Date   Angina at rest    Anxiety    Arthritis    Asthma    Attention-deficit/hyperactivity disorder    Bipolar disorder (HCC)    COPD (chronic obstructive pulmonary disease) (HCC)    Depression    GERD (gastroesophageal reflux disease)    GI bleed    Heart murmur    History of tobacco abuse    Hypertension    Hypertrophic cardiomyopathy (HCC)    s/p myomectomy   Peripheral neuropathy    PTSD (post-traumatic stress disorder)      Family History  Problem Relation Age of Onset   Cancer - Lung Mother    Skin cancer Mother    Alcohol abuse Mother    Drug abuse Mother    Stroke Father    Hypertension Father    Alcohol abuse Father    Drug abuse Father    Drug abuse Sister      Past Surgical History:  Procedure Laterality Date   CARDIAC CATHETERIZATION     11/14/2010   COLON SURGERY     CORONARY ARTERY BYPASS GRAFT     for myomectomy for hypertrophic cardiomyopathy    Social History   Socioeconomic History   Marital status: Single    Spouse name: Not on file   Number of children: Not on file   Years of education: Not on file   Highest education level: Not on file  Occupational History   Not on file  Tobacco Use   Smoking status: Every Day    Current packs/day: 2.00    Average packs/day: 2.0 packs/day for 35.3 years (70.7 ttl pk-yrs)    Types: Cigarettes    Start date: 12/31/1988    Smokeless tobacco: Never   Tobacco comments:    1 PPD (currently smoking)  Vaping Use   Vaping status: Never Used  Substance and Sexual Activity   Alcohol use: No    Alcohol/week: 0.0 standard drinks of alcohol   Drug use: No   Sexual activity: Yes    Partners: Female    Birth control/protection: None  Other Topics Concern   Not on file  Social History Narrative   Not on file   Social Drivers of Health   Financial Resource Strain: Patient Declined (03/06/2024)   Received from [facility]   Overall Financial Resource Strain (CARDIA)    Difficulty of Paying Living Expenses: Patient declined  Food Insecurity: Patient Declined (03/06/2024)   Received from [facility]   Hunger Vital Sign    Within the past 12 months, you worried that your food would run out before you got the money to buy more.: Patient declined    Within the past 12 months, the food you bought just didn't last and you didn't have money to get more.: Patient declined  Transportation Needs: Patient Declined (03/06/2024)   Received from [facility] - Transportation    In the past 12 months, has lack of transportation kept you from medical appointments or from getting medications?: Patient declined    Lack of Transportation (Non-Medical): Patient declined  Physical Activity: Not on file  Stress: Not on file  Social Connections: Not on file  Intimate Partner Violence: Not At Risk (03/23/2023)   Humiliation, Afraid, Rape, and Kick questionnaire    Fear of Current or Ex-Partner: No  Emotionally Abused: No    Physically Abused: No    Sexually Abused: No     No Known Allergies   CBC    Component Value Date/Time   WBC 8.1 03/14/2024 2229   RBC 5.67 03/14/2024 2229   HGB 16.9 03/14/2024 2229   HGB 18.3 (H) 03/23/2023 1205   HGB CANCELED 07/05/2019 1530   HCT 50.1 03/14/2024 2229   HCT CANCELED 07/05/2019 1530   PLT 218 03/14/2024 2229   PLT 200  03/23/2023 1205   PLT CANCELED 07/05/2019 1530   MCV 88.4 03/14/2024 2229   MCV 82 06/14/2013 2021   MCH 29.8 03/14/2024 2229   MCHC 33.7 03/14/2024 2229   RDW 12.5 03/14/2024 2229   RDW 13.1 06/14/2013 2021   LYMPHSABS 1.8 03/14/2024 2229   LYMPHSABS CANCELED 07/05/2019 1530   LYMPHSABS 1.1 06/14/2013 2021   MONOABS 0.6 03/14/2024 2229   MONOABS 1.0 06/14/2013 2021   EOSABS 0.2 03/14/2024 2229   EOSABS CANCELED 07/05/2019 1530   EOSABS 0.3 06/14/2013 2021   BASOSABS 0.1 03/14/2024 2229   BASOSABS CANCELED 07/05/2019 1530   BASOSABS 0.1 06/14/2013 2021    Pulmonary Functions Testing Results:    Latest Ref Rng & Units 05/06/2023    3:40 PM  PFT Results  FVC-Pre L 4.09   FVC-Predicted Pre % 78   FVC-Post L 4.07   FVC-Predicted Post % 77   Pre FEV1/FVC % % 68   Post FEV1/FCV % % 77   FEV1-Pre L 2.78   FEV1-Predicted Pre % 68   FEV1-Post L 3.12   DLCO uncorrected ml/min/mmHg 22.12   DLCO UNC% % 73   DLVA Predicted % 96   TLC L 6.13   TLC % Predicted % 85   RV % Predicted % 99     Outpatient Medications Prior to Visit  Medication Sig Dispense Refill   Aspirin-Salicylamide-Caffeine (ARTHRITIS STRENGTH BC POWDER PO) Take 1,000 mg by mouth 3 (three) times daily.     cyclobenzaprine  (FLEXERIL ) 10 MG tablet TAKE 1 TABLET BY MOUTH THREE TIMES DAILY AS NEEDED FOR MUSCLE SPASM (THIS  CAN  MAKE  YOU  SLEEPY) 90 tablet 1   dextroamphetamine  (DEXTROSTAT ) 10 MG tablet Take 10 mg by mouth daily.     diphenhydrAMINE  (BENADRYL ) 25 MG tablet Take 3 tablets (75 mg total) by mouth at bedtime as needed. 90 tablet 1   ezetimibe  (ZETIA ) 10 MG tablet Take 1 tablet (10 mg total) by mouth daily. 90 tablet 3   ibuprofen  (ADVIL ) 600 MG tablet Take 600 mg by mouth every 6 (six) hours as needed for moderate pain (pain score 4-6).     levothyroxine  (SYNTHROID ) 100 MCG tablet Take 1 tablet (100 mcg total) by mouth daily. 90 tablet 0   metoCLOPramide  (REGLAN ) 10 MG tablet Take 1 tablet (10 mg total)  by mouth every 6 (six) hours as needed. 30 tablet 0   NEEDLE, DISP, 18 G 18G X 1 MISC Use as directed 50 each 0   Oxycodone  HCl 10 MG TABS Take by mouth.     pantoprazole  (PROTONIX ) 40 MG tablet Take 1 tablet (40 mg total) by mouth 2 (two) times daily. 180 tablet 1   rosuvastatin  (CRESTOR ) 10 MG tablet Take 1 tablet (10 mg total) by mouth daily. 90 tablet 3   Spacer/Aero-Holding Chambers (AEROCHAMBER MV) inhaler Use as instructed 1 each 0   SYRINGE-NEEDLE, DISP, 3 ML (LUER LOCK SAFETY SYRINGES) 21G X 1-1/2 3 ML MISC Use as directed for  testosterone  administration 50 each 0   tadalafil  (CIALIS ) 20 MG tablet Take 1 tablet (20 mg total) by mouth daily. 30 tablet 0   testosterone  cypionate (DEPOTESTOSTERONE CYPIONATE) 200 MG/ML injection Inject 1 mL (200 mg total) into the muscle every 14 (fourteen) days. 4 mL 1   triamcinolone  ointment (KENALOG ) 0.5 % Apply 1 Application topically 2 (two) times daily. 30 g 3   verapamil  (CALAN ) 120 MG tablet Take 1 tablet (120 mg total) by mouth 2 (two) times daily. 180 tablet 3   SYMBICORT  160-4.5 MCG/ACT inhaler INHALE 2 PUFFS BY MOUTH IN THE MORNING AND 2 AT BEDTIME 11 g 11   Tiotropium Bromide Monohydrate  (SPIRIVA  RESPIMAT) 2.5 MCG/ACT AERS Inhale 2 puffs into the lungs daily. 60 each 11   VENTOLIN  HFA 108 (90 Base) MCG/ACT inhaler INHALE 2 PUFFS BY MOUTH EVERY 6 HOURS AS NEEDED FOR WHEEZING AND FOR SHORTNESS OF BREATH 18 g 11   No facility-administered medications prior to visit.

## 2024-05-05 ENCOUNTER — Telehealth: Payer: Self-pay | Admitting: Cardiovascular Disease

## 2024-05-05 NOTE — Telephone Encounter (Signed)
 Patient came in requesting lipid lab for new med, Ezetimibe . Patient states cholesterol is low, please advise.

## 2024-05-08 DIAGNOSIS — M47816 Spondylosis without myelopathy or radiculopathy, lumbar region: Secondary | ICD-10-CM | POA: Diagnosis not present

## 2024-05-08 DIAGNOSIS — M5432 Sciatica, left side: Secondary | ICD-10-CM | POA: Diagnosis not present

## 2024-05-08 DIAGNOSIS — M5416 Radiculopathy, lumbar region: Secondary | ICD-10-CM | POA: Diagnosis not present

## 2024-05-08 DIAGNOSIS — G894 Chronic pain syndrome: Secondary | ICD-10-CM | POA: Diagnosis not present

## 2024-05-08 DIAGNOSIS — Z79891 Long term (current) use of opiate analgesic: Secondary | ICD-10-CM | POA: Diagnosis not present

## 2024-05-09 NOTE — Telephone Encounter (Signed)
Attempted to contact the patient no answer and unable to leave a message.

## 2024-05-10 NOTE — Telephone Encounter (Signed)
 Called and spoke with patient. Patient states that he was not requesting a repeat lipid panel. Patient states that he would like a hgbA1c, CPK and liver function test. Patient states that he called Repatha manufacturer and this were recommended.

## 2024-05-16 NOTE — Telephone Encounter (Signed)
 Called patient and notified him of the following from Dr. Gollan.  If recent lipid lab work is accurate, lipid numbers are better, he does not need Repatha as numbers have dropped   In terms of the lab work he listed we already have a recent liver, He had A1c last year and glucose levels since that time have been normal If CK is needed, I need a diagnosis code as typically that is not a normal item checked on routine lab work unless there is muscle problems from a statin for example  Patient verbalizes understanding.

## 2024-05-19 ENCOUNTER — Ambulatory Visit: Admitting: Family Medicine

## 2024-05-19 ENCOUNTER — Other Ambulatory Visit: Payer: Self-pay | Admitting: Urology

## 2024-05-21 ENCOUNTER — Emergency Department
Admission: EM | Admit: 2024-05-21 | Discharge: 2024-05-22 | Disposition: A | Attending: Emergency Medicine | Admitting: Emergency Medicine

## 2024-05-21 ENCOUNTER — Emergency Department

## 2024-05-21 ENCOUNTER — Other Ambulatory Visit: Payer: Self-pay

## 2024-05-21 ENCOUNTER — Encounter: Payer: Self-pay | Admitting: Emergency Medicine

## 2024-05-21 DIAGNOSIS — R10A1 Flank pain, right side: Secondary | ICD-10-CM | POA: Diagnosis not present

## 2024-05-21 DIAGNOSIS — I10 Essential (primary) hypertension: Secondary | ICD-10-CM | POA: Diagnosis not present

## 2024-05-21 DIAGNOSIS — R1031 Right lower quadrant pain: Secondary | ICD-10-CM | POA: Diagnosis not present

## 2024-05-21 DIAGNOSIS — R1030 Lower abdominal pain, unspecified: Secondary | ICD-10-CM

## 2024-05-21 DIAGNOSIS — J449 Chronic obstructive pulmonary disease, unspecified: Secondary | ICD-10-CM | POA: Diagnosis not present

## 2024-05-21 DIAGNOSIS — M79662 Pain in left lower leg: Secondary | ICD-10-CM | POA: Diagnosis not present

## 2024-05-21 DIAGNOSIS — M79661 Pain in right lower leg: Secondary | ICD-10-CM

## 2024-05-21 LAB — CBC WITH DIFFERENTIAL/PLATELET
Abs Immature Granulocytes: 0.04 K/uL (ref 0.00–0.07)
Basophils Absolute: 0.1 K/uL (ref 0.0–0.1)
Basophils Relative: 1 %
Eosinophils Absolute: 0.1 K/uL (ref 0.0–0.5)
Eosinophils Relative: 1 %
HCT: 52.4 % — ABNORMAL HIGH (ref 39.0–52.0)
Hemoglobin: 17.9 g/dL — ABNORMAL HIGH (ref 13.0–17.0)
Immature Granulocytes: 0 %
Lymphocytes Relative: 15 %
Lymphs Abs: 1.5 K/uL (ref 0.7–4.0)
MCH: 30 pg (ref 26.0–34.0)
MCHC: 34.2 g/dL (ref 30.0–36.0)
MCV: 87.8 fL (ref 80.0–100.0)
Monocytes Absolute: 0.5 K/uL (ref 0.1–1.0)
Monocytes Relative: 5 %
Neutro Abs: 7.4 K/uL (ref 1.7–7.7)
Neutrophils Relative %: 78 %
Platelets: 223 K/uL (ref 150–400)
RBC: 5.97 MIL/uL — ABNORMAL HIGH (ref 4.22–5.81)
RDW: 12 % (ref 11.5–15.5)
WBC: 9.5 K/uL (ref 4.0–10.5)
nRBC: 0 % (ref 0.0–0.2)

## 2024-05-21 LAB — URINALYSIS, ROUTINE W REFLEX MICROSCOPIC
Bilirubin Urine: NEGATIVE
Glucose, UA: NEGATIVE mg/dL
Hgb urine dipstick: NEGATIVE
Ketones, ur: 5 mg/dL — AB
Leukocytes,Ua: NEGATIVE
Nitrite: NEGATIVE
Protein, ur: NEGATIVE mg/dL
Specific Gravity, Urine: 1.024 (ref 1.005–1.030)
pH: 5 (ref 5.0–8.0)

## 2024-05-21 MED ORDER — IBUPROFEN 600 MG PO TABS
600.0000 mg | ORAL_TABLET | Freq: Once | ORAL | Status: AC
  Administered 2024-05-21: 600 mg via ORAL
  Filled 2024-05-21: qty 1

## 2024-05-21 MED ORDER — ACETAMINOPHEN 500 MG PO TABS
1000.0000 mg | ORAL_TABLET | Freq: Once | ORAL | Status: AC
  Administered 2024-05-21: 1000 mg via ORAL
  Filled 2024-05-21: qty 2

## 2024-05-21 MED ORDER — OXYCODONE HCL 5 MG PO TABS: 5.0000 mg | ORAL_TABLET | Freq: Four times a day (QID) | ORAL | Status: DC | PRN

## 2024-05-21 MED ORDER — OXYCODONE HCL 5 MG PO TABS
5.0000 mg | ORAL_TABLET | Freq: Once | ORAL | Status: AC
  Administered 2024-05-21: 5 mg via ORAL
  Filled 2024-05-21: qty 1

## 2024-05-21 NOTE — ED Triage Notes (Addendum)
 Pt presents with c/o edema to abdomen, upper legs intermittently x 2 weeks, pt reports hx of same and needed Lasix, pain with urination x once, bladder and kidney pain

## 2024-05-21 NOTE — ED Provider Notes (Incomplete)
 [facility] Provider Note    Event Date/Time   First MD Initiated Contact with Patient 05/21/24 2302     (approximate)   History   Edema   HPI  Jake Elliott is a 53 y.o. male   Past medical history of smoker and COPD, bipolar, GERD, hypertension, hypertrophic cardiomyopathy status post myomectomy remotely, chronic pain syndrome, here with multiple complaints.  His acute complaints include 1 to 2 days of intermittent quite severe at times right flank pain that radiates into the left lower abdomen.  No dysuria or frequency.  Bowel movements normal.  He feels that his abdomen is slightly more swollen  Secondly he notes left calf greater than right calf pain.  This has been ongoing for the last couple of days.  He has high risk for VTE and was advised by his doctors in the past to start on Eliquis  but he has declined due to patient preference.  He used to have bilateral lower extremity edema that has actually gotten quite better over the last 2 weeks.  The pain in his calves do not worsen by walking.  More chronic problems that he also details include bilateral lymph node swelling just below the ear.  He denies any respiratory infectious symptoms.  They are not tender.  He expects to follow-up with his doctor about this problem.  He also has chronic pain, unchanged except for the leg pain that has slightly worsened as mentioned above.  He also has chronic smoker's cough which is unchanged not worse than nonproductive without fever or any other respiratory infectious symptoms today.  External Medical Documents Reviewed: Prior pulmonology notes.  Also most recent echo with Dr. Tresia notes attached showing persistent evidence of LVH, and a referral to HOCM clinic in [place] made.      Physical Exam   Triage Vital Signs: ED Triage Vitals  Encounter Vitals Group     BP 05/21/24 2154 (!) 165/108     Girls Systolic BP Percentile --      Girls  Diastolic BP Percentile --      Boys Systolic BP Percentile --      Boys Diastolic BP Percentile --      Pulse Rate 05/21/24 2154 91     Resp 05/21/24 2154 16     Temp 05/21/24 2154 98.3 F (36.8 C)     Temp Source 05/21/24 2154 Oral     SpO2 05/21/24 2154 100 %     Weight 05/21/24 2144 240 lb (108.9 kg)     Height 05/21/24 2144 5' 11 (1.803 m)     Head Circumference --      Peak Flow --      Pain Score 05/21/24 2144 7     Pain Loc --      Pain Education --      Exclude from Growth Chart --     Most recent vital signs: Vitals:   05/22/24 0146 05/22/24 0300  BP: (!) 157/101 (!) 156/100  Pulse: 89 92  Resp: 19 18  Temp: 98.5 F (36.9 C) 98.3 F (36.8 C)  SpO2: 99% 98%    General: Awake, no distress.  CV:  Good peripheral perfusion.  Resp:  Normal effort.  Abd:  No distention.  Other:  No significant peripheral edema noted.  Soft nontender abdomen.  Bilateral mobile nontender parotid area masses most likely lymph nodes.  Equal on both sides.  No evidence of airway obstruction.  Clear lungs.  No CVA tenderness.  Bilateral lower extremity and feet appear warm well-perfused and he has bounding pedal pulses bilateral equal.   ED Results / Procedures / Treatments   Labs (all labs ordered are listed, but only abnormal results are displayed) Labs Reviewed  CBC WITH DIFFERENTIAL/PLATELET - Abnormal; Notable for the following components:      Result Value   RBC 5.97 (*)    Hemoglobin 17.9 (*)    HCT 52.4 (*)    All other components within normal limits  URINALYSIS, ROUTINE W REFLEX MICROSCOPIC - Abnormal; Notable for the following components:   Color, Urine YELLOW (*)    APPearance CLEAR (*)    Ketones, ur 5 (*)    All other components within normal limits  TROPONIN T, HIGH SENSITIVITY - Abnormal; Notable for the following components:   Troponin T High Sensitivity 24 (*)    All other components within normal limits  BASIC METABOLIC PANEL WITH GFR  HEPATIC FUNCTION PANEL   PRO BRAIN NATRIURETIC PEPTIDE  LIPASE, BLOOD     I ordered and reviewed the above labs they are notable for cell counts and electrolytes largely unremarkable no evidence of urine infection.  EKG  ED ECG REPORT I, Ginnie Shams, the attending physician, personally viewed and interpreted this ECG.   Date: 05/21/2024  EKG Time: 0050  Rate: 66  Rhythm: sinus  Axis: nl  Intervals:nl  ST&T Change: no stemi, +LVH changes    RADIOLOGY I independently reviewed and interpreted CT abdomen pelvis and see no obvious obstructive or inflammatory changes. I also reviewed radiologist's formal read.   PROCEDURES:  Critical Care performed: No  Procedures   MEDICATIONS ORDERED IN ED: Medications  oxyCODONE  (Oxy IR/ROXICODONE ) immediate release tablet 5 mg (has no administration in time range)  oxyCODONE  (Oxy IR/ROXICODONE ) immediate release tablet 5 mg (5 mg Oral Given 05/21/24 2345)  acetaminophen  (TYLENOL ) tablet 1,000 mg (1,000 mg Oral Given 05/21/24 2345)  ibuprofen  (ADVIL ) tablet 600 mg (600 mg Oral Given 05/21/24 2345)     IMPRESSION / MDM / ASSESSMENT AND PLAN / ED COURSE  I reviewed the triage vital signs and the nursing notes.                                Patient's presentation is most consistent with acute presentation with potential threat to life or bodily function.  Differential diagnosis includes, but is not limited to, intra-abdominal infection or obstruction, renal colic, vascular emergency, urinary tract infection, DVT, calf strain, musculoskeletal pain, ischemia or claudication   The patient is on the cardiac monitor to evaluate for evidence of arrhythmia and/or significant heart rate changes.  MDM:    In terms of his acute problems today prompting his emergency department visit, in terms of his flank pain/abdominal pain suspicion for renal colic or other intra-abdominal infection or obstruction, will check CT scan of the abdomen pelvis.  In terms of his calf  pain, and high risk VTE as discussed by his previous doctors but ultimately patient decided to not start anticoagulation, will check DVT study bilaterally.  Consider claudication or ischemia but not reflected in his well-perfused exam today.  No injuries to suggest trauma.   - Unfortunately workup has been delayed because labs hemolyzed --  Redraw, check labs prior to CT scan.  Unremarkable workup.  Troponin mildly elevated consistent with similar findings in the past and with no active chest pain, I doubt cardiac ischemia.  I considered hospitalization for admission or observation however given stability in the emergency department, unremarkable workup as above, I think he be discharged at this time has close follow-up with his primary doctor and cardiologist.  Return precautions given.  (Labs been very delayed tonight.  His lipase is still pending.  I discussed with him this final remaining lab, he has no epigastric pain, has been tolerating p.o. and so I doubt significant pancreatitis.  I given the opportunity to wait for this lab prior to discharge but he does not want to wait any longer and will check the results on MyChart at home.  Even if there is some inflammatory changes, given that he has no pain and is tolerating p.o. I think going home at this time is reasonable.)        FINAL CLINICAL IMPRESSION(S) / ED DIAGNOSES   Final diagnoses:  Right flank pain  Lower abdominal pain  Bilateral calf pain     Rx / DC Orders   ED Discharge Orders     None        Note:  This document was prepared using Dragon voice recognition software and may include unintentional dictation errors.    Cyrena Mylar, MD 05/22/24 9575    Cyrena Mylar, MD 05/22/24 [phone]

## 2024-05-22 DIAGNOSIS — M79605 Pain in left leg: Secondary | ICD-10-CM | POA: Diagnosis not present

## 2024-05-22 DIAGNOSIS — M79604 Pain in right leg: Secondary | ICD-10-CM | POA: Diagnosis not present

## 2024-05-22 LAB — HEPATIC FUNCTION PANEL
ALT: 28 U/L (ref 0–44)
AST: 25 U/L (ref 15–41)
Albumin: 4.6 g/dL (ref 3.5–5.0)
Alkaline Phosphatase: 73 U/L (ref 38–126)
Bilirubin, Direct: 0.2 mg/dL (ref 0.0–0.2)
Indirect Bilirubin: 0.3 mg/dL (ref 0.3–0.9)
Total Bilirubin: 0.5 mg/dL (ref 0.0–1.2)
Total Protein: 6.7 g/dL (ref 6.5–8.1)

## 2024-05-22 LAB — BASIC METABOLIC PANEL WITH GFR
Anion gap: 14 (ref 5–15)
BUN: 18 mg/dL (ref 6–20)
CO2: 26 mmol/L (ref 22–32)
Calcium: 9.2 mg/dL (ref 8.9–10.3)
Chloride: 105 mmol/L (ref 98–111)
Creatinine, Ser: 1.04 mg/dL (ref 0.61–1.24)
GFR, Estimated: >60 mL/min (ref >60)
Glucose, Bld: 93 mg/dL (ref 70–99)
Potassium: 4.7 mmol/L (ref 3.5–5.1)
Sodium: 144 mmol/L (ref 135–145)

## 2024-05-22 LAB — PRO BRAIN NATRIURETIC PEPTIDE: Pro Brain Natriuretic Peptide: 204 pg/mL (ref <300.0)

## 2024-05-22 LAB — LIPASE, BLOOD: Lipase: 17 U/L (ref 11–51)

## 2024-05-22 LAB — TROPONIN T, HIGH SENSITIVITY: Troponin T High Sensitivity: 24 ng/L — ABNORMAL HIGH (ref 0–19)

## 2024-05-22 NOTE — Discharge Instructions (Addendum)
 Fortunately your evaluation in the emergency department did not show any emergency conditions that would require hospitalization or surgery tonight.  There was no evidence of blood clot in your leg.  It is very important that you follow-up with your primary doctor and your heart doctor for further evaluation and management of your health conditions.  Thank you for choosing us  for your health care today!  Please see your primary doctor this week for a follow up appointment.   If you have any new, worsening, or unexpected symptoms call your doctor right away or come back to the emergency department for reevaluation.  It was my pleasure to care for you today.   Ginnie EDISON Cyrena, MD

## 2024-05-24 ENCOUNTER — Telehealth: Payer: Self-pay | Admitting: Urology

## 2024-05-24 DIAGNOSIS — R413 Other amnesia: Secondary | ICD-10-CM | POA: Diagnosis not present

## 2024-05-24 DIAGNOSIS — G4733 Obstructive sleep apnea (adult) (pediatric): Secondary | ICD-10-CM | POA: Diagnosis not present

## 2024-05-24 NOTE — Telephone Encounter (Signed)
 Pt stopped by office to let us  know RBC, HCT, and hemoglobin were high, just barely.  Everything else was good.  [facility] sent refill request to us  last week for testosterone  injections.  He got a message on MyChart that said his numbers were too high to refill.  He needs to get rid of some blood.

## 2024-05-25 NOTE — Telephone Encounter (Signed)
 He has been seen by hematology for elevated RBC.  He can either donate blood or call Dr. Jerone office to arrange phlebotomy

## 2024-05-26 ENCOUNTER — Inpatient Hospital Stay: Attending: Oncology | Admitting: Oncology

## 2024-05-26 ENCOUNTER — Encounter: Payer: Self-pay | Admitting: Oncology

## 2024-05-26 ENCOUNTER — Telehealth: Payer: Self-pay | Admitting: Urology

## 2024-05-26 ENCOUNTER — Telehealth: Payer: Self-pay | Admitting: Cardiovascular Disease

## 2024-05-26 ENCOUNTER — Inpatient Hospital Stay

## 2024-05-26 VITALS — BP 150/99 | HR 85 | Temp 97.6°F | Resp 20 | Wt 239.6 lb

## 2024-05-26 VITALS — BP 130/84 | HR 77 | Resp 18

## 2024-05-26 DIAGNOSIS — I10 Essential (primary) hypertension: Secondary | ICD-10-CM | POA: Insufficient documentation

## 2024-05-26 DIAGNOSIS — Z79890 Hormone replacement therapy (postmenopausal): Secondary | ICD-10-CM | POA: Diagnosis not present

## 2024-05-26 DIAGNOSIS — D751 Secondary polycythemia: Secondary | ICD-10-CM | POA: Diagnosis not present

## 2024-05-26 DIAGNOSIS — F17210 Nicotine dependence, cigarettes, uncomplicated: Secondary | ICD-10-CM | POA: Insufficient documentation

## 2024-05-26 DIAGNOSIS — R599 Enlarged lymph nodes, unspecified: Secondary | ICD-10-CM | POA: Insufficient documentation

## 2024-05-26 DIAGNOSIS — E291 Testicular hypofunction: Secondary | ICD-10-CM | POA: Insufficient documentation

## 2024-05-26 NOTE — Telephone Encounter (Signed)
 Dr. twylla has a message about him getting testosterone 

## 2024-05-26 NOTE — Telephone Encounter (Signed)
 Patient stopped in to request a refill for Testosterone  be sent to pharmacy. Please advise.

## 2024-05-26 NOTE — Patient Instructions (Signed)

## 2024-05-26 NOTE — Progress Notes (Unsigned)
 Cardiology Office Note  Date:  05/29/2024   ID:  Jake Elliott, Jake Elliott 1970-06-24, MRN [id number]  PCP:  Donzella Lauraine SAILOR, DO   Chief Complaint  Patient presents with   Follow-up    Patient c/o increased swelling, shortness of breath and chest pain.    HPI:  Jake Elliott is a 53 year old gentleman who has a history of  ADHD, insomnia and bipolar.  HOCM, myomectomy in 2008 ,  Severe LVH, septal hypertrophy with LVOT gradient cardiac catheterization June 2012 showing no significant coronary disease,  sleep apnea, unable to tolerate CPAP,  nonsustained VT Prior history of heavy opiate use for chronic pain Takes high-dose NSAIDs, aspirin for chronic pain and headaches/neck pain who presents for routine followup of his tachycardia and hypertension  Last seen by myself in clinic 9/25 Was in the ER 05/22/24  for leg swelling, bladder/kidney pain.  Feels he was retaining fluid and was requesting Lasix  Workup essentially normal including UA normal,  BMP normal CT abd Normal  Interestingly he reports and starting Zetia  his leg swelling below the knees has improved but states having thigh and abdominal swelling, feels the fluid has redistributed to other areas  Hx of polycythemia, Had phlebotomy Friday through hematology  Has appt with HOCM clinic Jan 9th for severe LVH  Told he had Crohn's disease based on GI at [facility] EGD and colo suggested, he has declined Prefers no general anesthesia  Pain l>r pain under lobe on b/l ears Since starting zetia , wonders if he has glandular swelling from the medication but does not want to stop it as cholesterol numbers have improved  Prior history of waking in the middle of the night,  body is shaking, pulsating Previously unable to tolerate CPAP  Previously took himself off metoprolol  as he did not feel well on it Prior history of secondary heart block on both calcium  channel blocker and beta-blocker Currently takes whole dose verapamil   in the morning, half dose at night  Chronic lower extremity swelling dating back 20 years Continues to smoke, does not want to quit  CT scan abdomen pelvis images reviewed  aortic atherosclerosis  History of bipolar/depression/anxiety/ADHD  history of PE was placed on Eliquis  hospital April 2024 with abdominal pain, rectal bleeding There is recommended he take Cipro  and Flagyl  for colitis , Stopped Eliquis   past medical history reviewed  MRI neck, performed at [facility] Prominent right central  disc osteophyte and uncovertebral joint hypertrophy at C5-6 causes compression of the spinal cord and severe narrowing also extending into the  right neural foramen (25:81). At C6-7, a disc osteophyte and uncovertebral  joint hypertrophy indents the spinal cord and causes mild canal stenosis.  No high-grade neural foraminal narrowing.  History of anxiety, presenting with tightness in back, palpitations, fatigue, headaches  Echocardiogram February 2021  Normal ejection fraction, severe asymmetric left ventricular hypertrophy disproportionately affecting the septum SAM with trivial MR    history of anxiety depression. He has had a long hospital course requiring assistance from psychiatry.   chronic pain syndrome.   Prior Holter monitor in 2013 showing normal sinus rhythm with APCs and PVCs   PMH:   has a past medical history of Angina at rest, Anxiety, Arthritis, Asthma, Attention-deficit/hyperactivity disorder, Bipolar disorder (HCC), COPD (chronic obstructive pulmonary disease) (HCC), Depression, GERD (gastroesophageal reflux disease), GI bleed, Heart murmur, History of tobacco abuse, Hypertension, Hypertrophic cardiomyopathy (HCC), Peripheral neuropathy, and PTSD (post-traumatic stress disorder).  PSH:    Past Surgical History:  Procedure Laterality Date  CARDIAC CATHETERIZATION     11/14/2010   COLON SURGERY     CORONARY ARTERY BYPASS GRAFT     for myomectomy for hypertrophic  cardiomyopathy    Current Outpatient Medications  Medication Sig Dispense Refill   Aspirin-Salicylamide-Caffeine (ARTHRITIS STRENGTH BC POWDER PO) Take 1,000 mg by mouth 3 (three) times daily.     cyclobenzaprine  (FLEXERIL ) 10 MG tablet TAKE 1 TABLET BY MOUTH THREE TIMES DAILY AS NEEDED FOR MUSCLE SPASM (THIS  CAN  MAKE  YOU  SLEEPY) 90 tablet 1   dextroamphetamine  (DEXTROSTAT ) 10 MG tablet Take 10 mg by mouth daily.     diphenhydrAMINE  (BENADRYL ) 25 MG tablet Take 3 tablets (75 mg total) by mouth at bedtime as needed. 90 tablet 1   ezetimibe  (ZETIA ) 10 MG tablet Take 1 tablet (10 mg total) by mouth daily. 90 tablet 3   ibuprofen  (ADVIL ) 600 MG tablet Take 600 mg by mouth every 6 (six) hours as needed for moderate pain (pain score 4-6).     levothyroxine  (SYNTHROID ) 100 MCG tablet Take 1 tablet (100 mcg total) by mouth daily. 90 tablet 0   metoCLOPramide  (REGLAN ) 10 MG tablet Take 1 tablet (10 mg total) by mouth every 6 (six) hours as needed. 30 tablet 0   NEEDLE, DISP, 18 G 18G X 1 MISC Use as directed 50 each 0   Oxycodone  HCl 10 MG TABS Take by mouth.     pantoprazole  (PROTONIX ) 40 MG tablet Take 1 tablet (40 mg total) by mouth 2 (two) times daily. 180 tablet 1   rosuvastatin  (CRESTOR ) 10 MG tablet Take 1 tablet (10 mg total) by mouth daily. 90 tablet 3   Spacer/Aero-Holding Chambers (AEROCHAMBER MV) inhaler Use as instructed 1 each 0   SYMBICORT  160-4.5 MCG/ACT inhaler Inhale 2 puffs into the lungs in the morning and at bedtime. 11 g 11   SYRINGE-NEEDLE, DISP, 3 ML (LUER LOCK SAFETY SYRINGES) 21G X 1-1/2 3 ML MISC Use as directed for testosterone  administration 50 each 0   tadalafil  (CIALIS ) 20 MG tablet Take 1 tablet (20 mg total) by mouth daily. 30 tablet 0   testosterone  cypionate (DEPOTESTOSTERONE CYPIONATE) 200 MG/ML injection Inject 1 mL (200 mg total) into the muscle every 14 (fourteen) days. 4 mL 1   Tiotropium Bromide (SPIRIVA  RESPIMAT) 2.5 MCG/ACT AERS Inhale 2 puffs into the  lungs daily. 4 g 12   triamcinolone  ointment (KENALOG ) 0.5 % Apply 1 Application topically 2 (two) times daily. 30 g 3   VENTOLIN  HFA 108 (90 Base) MCG/ACT inhaler Inhale 1-2 puffs into the lungs every 6 (six) hours as needed for wheezing or shortness of breath. 18 g 11   verapamil  (CALAN ) 120 MG tablet Take 1 tablet (120 mg total) by mouth 2 (two) times daily. 180 tablet 3   No current facility-administered medications for this visit.   Allergies:   Zetia  [ezetimibe ]   Social History:  The patient  reports that he has been smoking cigarettes. He started smoking about 35 years ago. He has a 70.8 pack-year smoking history. He has never used smokeless tobacco. He reports that he does not drink alcohol and does not use drugs.   Family History:   family history includes Alcohol abuse in his father and mother; Cancer - Lung in his mother; Drug abuse in his father, mother, and sister; Hypertension in his father; Skin cancer in his mother; Stroke in his father.    Review of Systems: Review of Systems  Constitutional: Negative.   HENT:  Negative.    Respiratory: Negative.    Cardiovascular: Negative.   Gastrointestinal: Negative.   Musculoskeletal: Negative.   Neurological: Negative.   Psychiatric/Behavioral: Negative.    All other systems reviewed and are negative.  PHYSICAL EXAM: VS:  BP (!) 160/92 (BP Location: Left Arm, Patient Position: Sitting, Cuff Size: Large)   Pulse 91   Ht 5' 11 (1.803 m)   Wt 238 lb (108 kg)   SpO2 93%   BMI 33.19 kg/m  , BMI Body mass index is 33.19 kg/m. Constitutional:  oriented to person, place, and time. No distress.  HENT:  Head: Grossly normal Eyes:  no discharge. No scleral icterus.  Neck: No JVD, no carotid bruits  Cardiovascular: Regular rate and rhythm, 1-2/6 SEM RSB,  Pulmonary/Chest: Clear to auscultation bilaterally, no wheezes or rails Abdominal: Soft.  no distension.  no tenderness.  Musculoskeletal: Normal range of motion Neurological:   normal muscle tone. Coordination normal. No atrophy Skin: Skin warm and dry Psychiatric: normal affect, pleasant   Recent Labs: 03/14/2024: TSH 2.623 05/21/2024: Hemoglobin 17.9; Platelets 223 05/22/2024: ALT 28; BUN 18; Creatinine, Ser 1.04; Potassium 4.7; Pro Brain Natriuretic Peptide 204.0; Sodium 144    Lipid Panel Lab Results  Component Value Date   CHOL 100 04/21/2024   HDL 43 04/21/2024   LDLCALC 51 04/21/2024   TRIG 28 04/21/2024    Wt Readings from Last 3 Encounters:  05/29/24 238 lb (108 kg)  05/26/24 239 lb 9.6 oz (108.7 kg)  05/21/24 240 lb (108.9 kg)    ASSESSMENT AND PLAN:  Frequent headaches -  History of chronic pain and headaches Previously on NSAIDs, pain medication  Hyperlipidemia, unspecified hyperlipidemia type - Numbers improved on Crestor  and Zetia  Despite concern for side effects on Zetia  he prefers to stay on the medication at this time  Aortic atherosclerosis Images previously reviewed from CT scan abdomen pelvis Stressed smoking cessation, aggressive cholesterol management  Smoking We have encouraged him to continue to work on weaning his cigarettes and smoking cessation. He will continue to work on this and does not want any assistance with chantix.    Hypertrophic obstructive cardiomyopathy (HCC) - Previous myomectomy,2 cm septal wall,  -Elevated outflow tract gradient with Valsalva -Currently on verapamil , previously took himself off metoprolol  Previously discussed Camzyos.  - Has appointment with HOCM clinic January 9  Essential hypertension Blood pressure elevated today, recommend close monitoring at home May need to increase verapamil  dosing back to 1 whole pill twice daily  Tachycardia on verapamil  BID (half dose in the evening) Previously stopped metoprolol     No orders of the defined types were placed in this encounter.    Signed, Velinda Lunger, M.D., Ph.D. 05/29/2024  [facility] [place],  [place] [phone]

## 2024-05-26 NOTE — Progress Notes (Signed)
 Patient states he is concerned about his b9 and b12. Patient states he is also concerned about swollen salvia glands.

## 2024-05-26 NOTE — Progress Notes (Signed)
  [facility]  Telephone:([phone]) [phone] Fax:([phone]) [phone]  ID: Oneil Rosaura Brake OB: Oct 11, 1970  MR#: [id number]  RDW#:[id number]  Patient Care Team: Donzella Lauraine SAILOR, DO as PCP - General (Family Medicine) Perla Evalene PARAS, MD as PCP - Cardiology (Cardiology) Perla Evalene PARAS, MD as Consulting Physician (Cardiology) Jacobo Evalene PARAS, MD as Consulting Physician (Oncology)  CHIEF COMPLAINT: Secondary polycythemia.  INTERVAL HISTORY: Patient returns to clinic today as an add-on for continued phlebotomy.  He reports he cannot get approval for his testosterone  injections until his hemoglobin is decreased.  He has noticed enlarged glands bilateral neck, but otherwise feels well.  He does not complain of any weakness or fatigue.  He denies any recent fevers or illnesses.  He has no neurologic complaints.  He has a good appetite and denies weight loss.  He denies any chest pain, shortness of breath, cough, or hemoptysis.  He denies any nausea, vomiting, constipation, or diarrhea.  He has no urinary complaints.  Patient offers no further specific complaints today.  REVIEW OF SYSTEMS:   Review of Systems  Constitutional: Negative.  Negative for fever, malaise/fatigue and weight loss.  HENT:  Negative for congestion.   Respiratory: Negative.  Negative for cough, hemoptysis, shortness of breath and wheezing.   Cardiovascular: Negative.  Negative for chest pain and leg swelling.  Gastrointestinal:  Negative for abdominal pain.  Genitourinary: Negative.  Negative for dysuria.  Musculoskeletal: Negative.  Negative for back pain.  Skin: Negative.  Negative for rash.  Neurological: Negative.  Negative for dizziness, focal weakness, weakness and headaches.  Psychiatric/Behavioral: Negative.  The patient is not nervous/anxious.     As per HPI. Otherwise, a complete review of systems is negative.  PAST MEDICAL HISTORY: Past Medical History:  Diagnosis Date   Angina at rest     Anxiety    Arthritis    Asthma    Attention-deficit/hyperactivity disorder    Bipolar disorder (HCC)    COPD (chronic obstructive pulmonary disease) (HCC)    Depression    GERD (gastroesophageal reflux disease)    GI bleed    Heart murmur    History of tobacco abuse    Hypertension    Hypertrophic cardiomyopathy (HCC)    s/p myomectomy   Peripheral neuropathy    PTSD (post-traumatic stress disorder)     PAST SURGICAL HISTORY: Past Surgical History:  Procedure Laterality Date   CARDIAC CATHETERIZATION     11/14/2010   COLON SURGERY     CORONARY ARTERY BYPASS GRAFT     for myomectomy for hypertrophic cardiomyopathy    FAMILY HISTORY: Family History  Problem Relation Age of Onset   Cancer - Lung Mother    Skin cancer Mother    Alcohol abuse Mother    Drug abuse Mother    Stroke Father    Hypertension Father    Alcohol abuse Father    Drug abuse Father    Drug abuse Sister     ADVANCED DIRECTIVES (Y/N):  N  HEALTH MAINTENANCE: Social History   Tobacco Use   Smoking status: Every Day    Current packs/day: 2.00    Average packs/day: 2.0 packs/day for 35.4 years (70.8 ttl pk-yrs)    Types: Cigarettes    Start date: 12/31/1988   Smokeless tobacco: Never   Tobacco comments:    1 PPD (currently smoking)  Vaping Use   Vaping status: Never Used  Substance Use Topics   Alcohol use: No    Alcohol/week: 0.0 standard drinks of  alcohol   Drug use: No     Colonoscopy:  PAP:  Bone density:  Lipid panel:  Allergies  Allergen Reactions   Zetia  [Ezetimibe ] Other (See Comments)    Enlarged glands    Current Outpatient Medications  Medication Sig Dispense Refill   Aspirin-Salicylamide-Caffeine (ARTHRITIS STRENGTH BC POWDER PO) Take 1,000 mg by mouth 3 (three) times daily.     cyclobenzaprine  (FLEXERIL ) 10 MG tablet TAKE 1 TABLET BY MOUTH THREE TIMES DAILY AS NEEDED FOR MUSCLE SPASM (THIS  CAN  MAKE  YOU  SLEEPY) 90 tablet 1   dextroamphetamine  (DEXTROSTAT ) 10  MG tablet Take 10 mg by mouth daily.     diphenhydrAMINE  (BENADRYL ) 25 MG tablet Take 3 tablets (75 mg total) by mouth at bedtime as needed. 90 tablet 1   ezetimibe  (ZETIA ) 10 MG tablet Take 1 tablet (10 mg total) by mouth daily. 90 tablet 3   ibuprofen  (ADVIL ) 600 MG tablet Take 600 mg by mouth every 6 (six) hours as needed for moderate pain (pain score 4-6).     levothyroxine  (SYNTHROID ) 100 MCG tablet Take 1 tablet (100 mcg total) by mouth daily. 90 tablet 0   metoCLOPramide  (REGLAN ) 10 MG tablet Take 1 tablet (10 mg total) by mouth every 6 (six) hours as needed. 30 tablet 0   NEEDLE, DISP, 18 G 18G X 1 MISC Use as directed 50 each 0   Oxycodone  HCl 10 MG TABS Take by mouth.     pantoprazole  (PROTONIX ) 40 MG tablet Take 1 tablet (40 mg total) by mouth 2 (two) times daily. 180 tablet 1   rosuvastatin  (CRESTOR ) 10 MG tablet Take 1 tablet (10 mg total) by mouth daily. 90 tablet 3   Spacer/Aero-Holding Chambers (AEROCHAMBER MV) inhaler Use as instructed 1 each 0   SYMBICORT  160-4.5 MCG/ACT inhaler Inhale 2 puffs into the lungs in the morning and at bedtime. 11 g 11   SYRINGE-NEEDLE, DISP, 3 ML (LUER LOCK SAFETY SYRINGES) 21G X 1-1/2 3 ML MISC Use as directed for testosterone  administration 50 each 0   tadalafil  (CIALIS ) 20 MG tablet Take 1 tablet (20 mg total) by mouth daily. 30 tablet 0   testosterone  cypionate (DEPOTESTOSTERONE CYPIONATE) 200 MG/ML injection Inject 1 mL (200 mg total) into the muscle every 14 (fourteen) days. 4 mL 1   Tiotropium Bromide (SPIRIVA  RESPIMAT) 2.5 MCG/ACT AERS Inhale 2 puffs into the lungs daily. 4 g 12   triamcinolone  ointment (KENALOG ) 0.5 % Apply 1 Application topically 2 (two) times daily. 30 g 3   VENTOLIN  HFA 108 (90 Base) MCG/ACT inhaler Inhale 1-2 puffs into the lungs every 6 (six) hours as needed for wheezing or shortness of breath. 18 g 11   verapamil  (CALAN ) 120 MG tablet Take 1 tablet (120 mg total) by mouth 2 (two) times daily. 180 tablet 3   No  current facility-administered medications for this visit.    OBJECTIVE: Vitals:   05/26/24 0951  BP: (!) 150/99  Pulse: 85  Resp: 20  Temp: 97.6 F (36.4 C)  SpO2: 100%     Body mass index is 33.42 kg/m.    ECOG FS:0 - Asymptomatic  General: Well-developed, well-nourished, no acute distress. Eyes: Pink conjunctiva, anicteric sclera. HEENT: Normocephalic, moist mucous membranes. Lungs: No audible wheezing or coughing. Heart: Regular rate and rhythm. Abdomen: Soft, nontender, no obvious distention. Musculoskeletal: No edema, cyanosis, or clubbing. Neuro: Alert, answering all questions appropriately. Cranial nerves grossly intact. Skin: No rashes or petechiae noted. Psych: Normal affect. Alert  LAB RESULTS:  Lab Results  Component Value Date   NA 144 05/22/2024   K 4.7 05/22/2024   CL 105 05/22/2024   CO2 26 05/22/2024   GLUCOSE 93 05/22/2024   BUN 18 05/22/2024   CREATININE 1.04 05/22/2024   CALCIUM  9.2 05/22/2024   PROT 6.7 05/22/2024   ALBUMIN 4.6 05/22/2024   AST 25 05/22/2024   ALT 28 05/22/2024   ALKPHOS 73 05/22/2024   BILITOT 0.5 05/22/2024   GFRNONAA >60 05/22/2024   GFRAA >60 03/09/2020    Lab Results  Component Value Date   WBC 9.5 05/21/2024   NEUTROABS 7.4 05/21/2024   HGB 17.9 (H) 05/21/2024   HCT 52.4 (H) 05/21/2024   MCV 87.8 05/21/2024   PLT 223 05/21/2024     STUDIES: CT ABDOMEN PELVIS WO CONTRAST Result Date: 05/22/2024 EXAM: CT ABDOMEN AND PELVIS WITHOUT CONTRAST 05/21/2024 11:51 pm TECHNIQUE: CT of the abdomen and pelvis was performed without the administration of intravenous contrast. Multiplanar reformatted images are provided for review. Automated exposure control, iterative reconstruction, and/or weight-based adjustment of the mA/kV was utilized to reduce the radiation dose to as low as reasonably achievable. COMPARISON: CT of the abdomen and pelvis dated 09/25/2022. CLINICAL HISTORY: Right-sided abdominal and flank pain. FINDINGS:  LOWER CHEST: The patient is status post sternotomy. No acute abnormality. LIVER: The liver is unremarkable. GALLBLADDER AND BILE DUCTS: Gallbladder is unremarkable. No biliary ductal dilatation. SPLEEN: No acute abnormality. PANCREAS: No acute abnormality. ADRENAL GLANDS: No acute abnormality. KIDNEYS, URETERS AND BLADDER: No evidence of nephrolithiasis or obstructive uropathy. No perinephric or periureteral stranding. Urinary bladder is unremarkable. GI AND BOWEL: Stomach demonstrates no acute abnormality. There are few sigmoid diverticula present. There is no bowel obstruction. PERITONEUM AND RETROPERITONEUM: No ascites. No free air. VASCULATURE: The abdominal aorta is normal in caliber and demonstrates mild calcific atheromatous disease. LYMPH NODES: No lymphadenopathy. REPRODUCTIVE ORGANS: No acute abnormality. BONES AND SOFT TISSUES: No acute osseous abnormality. No focal soft tissue abnormality. IMPRESSION: 1. No acute findings in the abdomen or pelvis related to the right-sided abdominal and flank pain. 2. Few sigmoid diverticula without evidence of diverticulitis. 3. Abdominal aorta normal in caliber with mild calcific atherosclerosis. No aneurysm; no follow-up imaging recommended for aortic size. Electronically signed by: Evalene Coho MD 05/22/2024 04:20 AM EST RP Workstation: [id number]   US  Venous Img Lower Bilateral Result Date: 05/22/2024 EXAM: ULTRASOUND DUPLEX OF THE BILATERAL LOWER EXTREMITY VEINS TECHNIQUE: Duplex ultrasound using B-mode/gray scaled imaging and Doppler spectral analysis and color flow was obtained of the deep venous structures of the bilateral lower extremity. COMPARISON: None available. CLINICAL HISTORY: Pain calve L>R Pain calve L>R FINDINGS: The common femoral vein, femoral vein, popliteal vein, and posterior tibial vein of the _laterality_ lower extremity demonstrate normal compressibility with normal color flow and spectral analysis. IMPRESSION: 1. No evidence of DVT.  Electronically signed by: Dorethia Molt MD 05/22/2024 12:23 AM EST RP Workstation: [id number]     ASSESSMENT: Secondary polycythemia.  PLAN:    Secondary polycythemia: Likely secondary to testosterone  use.  Patient's most recent hemoglobin has trended up to 17.9.  Previously, he also had an elevated carbon monoxide level, but the remainder of his laboratory work including iron stores, erythropoietin  levels, and JAK2 mutation with reflex are either negative or within normal limits.  Goal hemoglobin will be below 17.0.  Proceed with 500 mL phlebotomy today.  Return to clinic in 1 month with repeat laboratory work, further evaluation, and continuation of phlebotomy if needed. Low testosterone :  Okay from a hematology standpoint to reinitiate testosterone  supplementation.  Hypertension: Chronic and unchanged.  Patient's blood pressure is moderately elevated today.  Continue monitoring and treatment per primary care. Enlarged cervical glands: Patient has requested referral to ENT for evaluation.   Patient expressed understanding and was in agreement with this plan. He also understands that He can call clinic at any time with any questions, concerns, or complaints.    Evalene JINNY Reusing, MD   05/26/2024 11:10 AM

## 2024-05-26 NOTE — Telephone Encounter (Signed)
 Patient came in requesting contact info for referral from October 2025, AMBULATORY REFERRAL TO HYPERTROPHIC CARDIOMYOPATHY CLINIC. Several attempts to [facility] office no answer, urgent please assist.

## 2024-05-26 NOTE — Progress Notes (Signed)
 Jake Elliott presents today for phlebotomy per MD orders. Phlebotomy procedure started at 1108 and ended at 1120. 500 mls removed. Patient tolerated procedure well. IV needle removed intact.

## 2024-05-26 NOTE — Telephone Encounter (Signed)
 Called the patient concerning his referral that was placed in October. He has been advised that we will send a message to scheduling to set this up.   He has also requested a follow up with Dr. Gollan as he is having increased swelling and shortness of breath. He was seen in the ED for this on 05/21/24. He currently does not have a diuretic. He has been advised to limit his sodium intake and elevate his legs when he can. He stated that the shortness of breath is his normal and has not gotten worse. He was not audibly short of breath while on the phone and stated that he feels comfortable waiting until Monday to be seen.

## 2024-05-29 ENCOUNTER — Encounter: Payer: Self-pay | Admitting: Cardiovascular Disease

## 2024-05-29 ENCOUNTER — Other Ambulatory Visit: Payer: Self-pay | Admitting: Urology

## 2024-05-29 ENCOUNTER — Ambulatory Visit: Admitting: Cardiovascular Disease

## 2024-05-29 VITALS — BP 160/92 | HR 91 | Ht 71.0 in | Wt 238.0 lb

## 2024-05-29 DIAGNOSIS — I2510 Atherosclerotic heart disease of native coronary artery without angina pectoris: Secondary | ICD-10-CM | POA: Diagnosis not present

## 2024-05-29 DIAGNOSIS — I10 Essential (primary) hypertension: Secondary | ICD-10-CM | POA: Diagnosis not present

## 2024-05-29 DIAGNOSIS — I421 Obstructive hypertrophic cardiomyopathy: Secondary | ICD-10-CM

## 2024-05-29 DIAGNOSIS — E785 Hyperlipidemia, unspecified: Secondary | ICD-10-CM | POA: Diagnosis not present

## 2024-05-29 DIAGNOSIS — R0602 Shortness of breath: Secondary | ICD-10-CM | POA: Diagnosis not present

## 2024-05-29 DIAGNOSIS — R0609 Other forms of dyspnea: Secondary | ICD-10-CM

## 2024-05-29 DIAGNOSIS — F17200 Nicotine dependence, unspecified, uncomplicated: Secondary | ICD-10-CM

## 2024-05-29 DIAGNOSIS — R0789 Other chest pain: Secondary | ICD-10-CM | POA: Diagnosis not present

## 2024-05-29 MED ORDER — TESTOSTERONE CYPIONATE 200 MG/ML IM SOLN: 200.0000 mg | INTRAMUSCULAR | 1 refills | Status: AC

## 2024-05-29 MED ORDER — FUROSEMIDE 20 MG PO TABS: 20.0000 mg | ORAL_TABLET | Freq: Every day | ORAL | 1 refills | Status: DC | PRN

## 2024-05-29 NOTE — Telephone Encounter (Signed)
 Patient seen in clinic. All questions and concerns addressed at that time. Patient scheduled to see HOCM on 06/23/24.

## 2024-05-29 NOTE — Patient Instructions (Addendum)
 Medication Instructions:   Lasix  20 daily as needed for ABD swelling  If you need a refill on your cardiac medications before your next appointment, please call your pharmacy.   Lab work: No new labs needed  Testing/Procedures: No new testing needed  Follow-Up: At [facility], you and your health needs are our priority.  As part of our continuing mission to provide you with exceptional heart care, we have created designated Provider Care Teams.  These Care Teams include your primary Cardiologist (physician) and Advanced Practice Providers (APPs -  Physician Assistants and Nurse Practitioners) who all work together to provide you with the care you need, when you need it.  You will need a follow up appointment as needed  Providers on your designated Care Team:   Lonni Meager, NP Bernardino Bring, PA-C Cadence [place], [place]  COVID-19 Vaccine Information can be found at: [email or web address] For questions related to vaccine distribution or appointments, please email [email or web address] or call 2603983716.

## 2024-05-30 DIAGNOSIS — F1411 Cocaine abuse, in remission: Secondary | ICD-10-CM | POA: Diagnosis not present

## 2024-05-30 DIAGNOSIS — F0634 Mood disorder due to known physiological condition with mixed features: Secondary | ICD-10-CM | POA: Diagnosis not present

## 2024-05-30 DIAGNOSIS — F1111 Opioid abuse, in remission: Secondary | ICD-10-CM | POA: Diagnosis not present

## 2024-05-30 DIAGNOSIS — Z62819 Personal history of unspecified abuse in childhood: Secondary | ICD-10-CM | POA: Diagnosis not present

## 2024-05-30 DIAGNOSIS — F4312 Post-traumatic stress disorder, chronic: Secondary | ICD-10-CM | POA: Diagnosis not present

## 2024-05-30 DIAGNOSIS — Z599 Problem related to housing and economic circumstances, unspecified: Secondary | ICD-10-CM | POA: Diagnosis not present

## 2024-05-30 DIAGNOSIS — F900 Attention-deficit hyperactivity disorder, predominantly inattentive type: Secondary | ICD-10-CM | POA: Diagnosis not present

## 2024-05-30 DIAGNOSIS — F4323 Adjustment disorder with mixed anxiety and depressed mood: Secondary | ICD-10-CM | POA: Diagnosis not present

## 2024-05-31 ENCOUNTER — Ambulatory Visit: Admitting: Oncology

## 2024-05-31 ENCOUNTER — Other Ambulatory Visit

## 2024-05-31 ENCOUNTER — Encounter

## 2024-06-02 ENCOUNTER — Encounter: Payer: Self-pay | Admitting: Family Medicine

## 2024-06-02 ENCOUNTER — Ambulatory Visit: Admitting: Family Medicine

## 2024-06-02 VITALS — BP 147/84 | HR 78 | Temp 98.4°F | Ht 71.0 in | Wt 238.2 lb

## 2024-06-02 DIAGNOSIS — K111 Hypertrophy of salivary gland: Secondary | ICD-10-CM

## 2024-06-02 DIAGNOSIS — J439 Emphysema, unspecified: Secondary | ICD-10-CM

## 2024-06-02 DIAGNOSIS — I10 Essential (primary) hypertension: Secondary | ICD-10-CM

## 2024-06-02 DIAGNOSIS — D751 Secondary polycythemia: Secondary | ICD-10-CM

## 2024-06-02 DIAGNOSIS — R519 Headache, unspecified: Secondary | ICD-10-CM

## 2024-06-02 DIAGNOSIS — R29898 Other symptoms and signs involving the musculoskeletal system: Secondary | ICD-10-CM | POA: Diagnosis not present

## 2024-06-02 DIAGNOSIS — R413 Other amnesia: Secondary | ICD-10-CM | POA: Diagnosis not present

## 2024-06-02 NOTE — Progress Notes (Signed)
 "     Established patient visit   Patient: Jake Elliott   DOB: 1971-05-10   53 y.o. Male  MRN: [id number] Visit Date: 06/02/2024  Today's healthcare provider: LAURAINE LOISE BUOY, DO   Chief Complaint  Patient presents with   Medical Management of Chronic Issues    Patient is here for a 3 month follow up.  Expresses no concerns.  Vaccines declined   Subjective    HPI Jake Elliott is a 53 year old male with Crohn's disease who presents with gland swelling.  Jake Elliott has experienced bilateral, asymmetrical (left > right) swelling of the parotid glands for the past few months, which becomes more painful as it enlarges. The swelling began after starting Zetia  for cholesterol management, which Jake Elliott continues to take despite the swelling. No hearing pressure, ear pain, or bad taste in the mouth. No fever, chills, or recent illness.  Jake Elliott has no prior history of parotid gland swelling.  Jake Elliott reports a history of Crohn's disease and was hospitalized last year for a gastrointestinal infection. Jake Elliott underwent imaging and was advised to have a colonoscopy and endoscopy, which Jake Elliott declined due to not wanting sedation. Jake Elliott was treated with a two-week course of antibiotics, including Protonix  and possibly metronidazole , for a Helicobacter pylori infection. Jake Elliott experiences skin issues such as blisters and peeling after antibiotic courses.  Jake Elliott reports swelling in his legs and abdomen, particularly at night, and has been to the emergency room for Lasix , which was not administered. Jake Elliott has been prescribed Lasix  by cardiology but uses it sparingly due to concerns about his heart and blood thickness. Jake Elliott experiences significant pain when urinating after lying down due to fluid accumulation.  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 His diet is poor, consisting mainly of frozen foods, and Jake Elliott fasts during the day. Jake Elliott does not consume pork, beans, or dark leafy greens,  and his intake of eggs is limited. Jake Elliott experiences pain when eating, which affects his dietary choices.  Jake Elliott experiences headaches and has a history of NSAID use for pain management. Jake Elliott has been referred to neurology in the past but was dissatisfied with the consultation. Jake Elliott reports memory issues and frequent dropping of objects.  Jake Elliott is currently on testosterone  therapy at 100 mg per week and has undergone phlebotomy to manage blood thickness. Jake Elliott has a history of spine issues contributing to headaches and back pain. His sleep is generally okay but is avoiding many foods due to dietary concerns.      Medications: Show/hide medication list[1]       Objective    BP (!) 147/84 (BP Location: Left Arm, Patient Position: Sitting, Cuff Size: Large)   Pulse 78   Temp 98.4 F (36.9 C) (Oral)   Ht 5' 11 (1.803 m)   Wt 238 lb 3.2 oz (108 kg)   SpO2 98%   BMI 33.22 kg/m     Physical Exam Vitals and nursing note reviewed.  Constitutional:      General: Jake Elliott is not in acute distress.    Appearance: Normal appearance.  HENT:     Head: Normocephalic and atraumatic.     Salivary Glands: Right salivary gland is diffusely enlarged and tender. Left salivary gland is diffusely enlarged and tender.      Comments: Bilateral swelling of parotid glands (left>right).     Mouth/Throat:     Comments: Patient refused examination of inside of mouth. Eyes:  General: No scleral icterus.    Conjunctiva/sclera: Conjunctivae normal.  Cardiovascular:     Rate and Rhythm: Normal rate.  Pulmonary:     Effort: Pulmonary effort is normal.  Neurological:     Mental Status: Jake Elliott is alert and oriented to person, place, and time. Mental status is at baseline.  Psychiatric:        Mood and Affect: Mood normal.        Behavior: Behavior normal.      No results found for any visits on 06/02/24.  Assessment & Plan    Enlarged parotid gland -     US  SOFT TISSUE HEAD & NECK (NON-THYROID ); Future  Memory  changes -     Ambulatory referral to Neurology  Frequent headaches -     Ambulatory referral to Neurology  Weakness of both hands -     Ambulatory referral to Neurology  Polycythemia, secondary  Essential hypertension  Pulmonary emphysema (HCC)      Enlarged parotid glands Bilateral parotid gland enlargement, tender, possibly related to Zetia . No infection or dry mouth. - Ordered ultrasound of parotid glands. - Advised warm compresses for symptomatic relief.  Frequent headaches Chronic, pounding headaches, possibly spinal-related. Previous neurology consultation unsatisfactory. - Referred to neurology in [facility] for further evaluation, per patient request.  Memory changes; weakness of both hands Memory issues and ongoing weakness of both hands. No recent neurological evaluation. - Referred to neurology in [facility] for further evaluation, per patient request.  Polycythemia, secondary Elevated blood viscosity. Recent phlebotomy performed. Ongoing testosterone  therapy. - Continue monitoring blood viscosity and red blood cell count. - Follows with urology; defer to specialist management.  Essential hypertension Chronic, mildly elevated today, likely secondary to the patient's enlarged/tender parotid glands.  Managed with verapamil . 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  - Continue verapamil  120 mg twice daily for blood pressure management. - Follows with cardiology; defer to specialist management  Pulmonary emphysema Noted.  No acute concerns.  Follows with pulmonology; defer to specialist management.    Return in about 4 months (around 10/01/2024) for [facility] with Dr. Franchot.      I discussed the assessment and treatment plan with the patient  The patient was provided an opportunity to ask questions and all were answered. The patient agreed with the plan and demonstrated an understanding of the instructions.   The patient was advised to call back or seek an  in-person evaluation if the symptoms worsen or if the condition fails to improve as anticipated.    LAURAINE LOISE BUOY, DO  [facility] [phone] (phone) [phone] (fax)  [facility]     [1]  Outpatient Medications Prior to Visit  Medication Sig   Aspirin-Salicylamide-Caffeine (ARTHRITIS STRENGTH BC POWDER PO) Take 1,000 mg by mouth 3 (three) times daily.   cyclobenzaprine  (FLEXERIL ) 10 MG tablet TAKE 1 TABLET BY MOUTH THREE TIMES DAILY AS NEEDED FOR MUSCLE SPASM (THIS  CAN  MAKE  YOU  SLEEPY)   dextroamphetamine  (DEXTROSTAT ) 10 MG tablet Take 10 mg by mouth daily.   diphenhydrAMINE  (BENADRYL ) 25 MG tablet Take 3 tablets (75 mg total) by mouth at bedtime as needed.   ezetimibe  (ZETIA ) 10 MG tablet Take 1 tablet (10 mg total) by mouth daily.   furosemide  (LASIX ) 20 MG tablet Take 1 tablet (20 mg total) by mouth daily as needed.   ibuprofen  (ADVIL ) 600 MG tablet Take 600 mg by mouth every 6 (six) hours as needed for moderate pain (  pain score 4-6).   levothyroxine  (SYNTHROID ) 100 MCG tablet Take 1 tablet (100 mcg total) by mouth daily.   metoCLOPramide  (REGLAN ) 10 MG tablet Take 1 tablet (10 mg total) by mouth every 6 (six) hours as needed.   NEEDLE, DISP, 18 G 18G X 1 MISC Use as directed   Oxycodone  HCl 10 MG TABS Take by mouth.   pantoprazole  (PROTONIX ) 40 MG tablet Take 1 tablet (40 mg total) by mouth 2 (two) times daily.   rosuvastatin  (CRESTOR ) 10 MG tablet Take 1 tablet (10 mg total) by mouth daily.   Spacer/Aero-Holding Chambers (AEROCHAMBER MV) inhaler Use as instructed   SYMBICORT  160-4.5 MCG/ACT inhaler Inhale 2 puffs into the lungs in the morning and at bedtime.   SYRINGE-NEEDLE, DISP, 3 ML (LUER LOCK SAFETY SYRINGES) 21G X 1-1/2 3 ML MISC Use as directed for testosterone  administration   testosterone  cypionate (DEPOTESTOSTERONE CYPIONATE) 200 MG/ML injection Inject 1 mL (200 mg total) into the muscle every 14 (fourteen) days.    Tiotropium Bromide (SPIRIVA  RESPIMAT) 2.5 MCG/ACT AERS Inhale 2 puffs into the lungs daily.   triamcinolone  ointment (KENALOG ) 0.5 % Apply 1 Application topically 2 (two) times daily.   VENTOLIN  HFA 108 (90 Base) MCG/ACT inhaler Inhale 1-2 puffs into the lungs every 6 (six) hours as needed for wheezing or shortness of breath.   verapamil  (CALAN ) 120 MG tablet Take 1 tablet (120 mg total) by mouth 2 (two) times daily.   [DISCONTINUED] tadalafil  (CIALIS ) 20 MG tablet Take 1 tablet (20 mg total) by mouth daily.   No facility-administered medications prior to visit.   "

## 2024-06-11 ENCOUNTER — Other Ambulatory Visit: Payer: Self-pay | Admitting: Family Medicine

## 2024-06-11 DIAGNOSIS — N529 Male erectile dysfunction, unspecified: Secondary | ICD-10-CM

## 2024-06-11 DIAGNOSIS — Q248 Other specified congenital malformations of heart: Secondary | ICD-10-CM

## 2024-06-11 DIAGNOSIS — I421 Obstructive hypertrophic cardiomyopathy: Secondary | ICD-10-CM

## 2024-06-12 ENCOUNTER — Ambulatory Visit
Admission: RE | Admit: 2024-06-12 | Discharge: 2024-06-12 | Disposition: A | Discharge status: Home / Self Care | Source: Ambulatory Visit | Attending: Family Medicine | Admitting: Family Medicine

## 2024-06-12 DIAGNOSIS — K111 Hypertrophy of salivary gland: Secondary | ICD-10-CM | POA: Diagnosis not present

## 2024-06-18 ENCOUNTER — Encounter: Payer: Self-pay | Admitting: Family Medicine

## 2024-06-23 ENCOUNTER — Ambulatory Visit: Attending: Internal Medicine | Admitting: Internal Medicine

## 2024-06-23 ENCOUNTER — Ambulatory Visit: Admitting: Genetic Counselor

## 2024-06-23 VITALS — BP 152/85 | HR 82 | Ht 71.0 in | Wt 241.0 lb

## 2024-06-23 DIAGNOSIS — R0602 Shortness of breath: Secondary | ICD-10-CM | POA: Insufficient documentation

## 2024-06-23 DIAGNOSIS — I421 Obstructive hypertrophic cardiomyopathy: Secondary | ICD-10-CM | POA: Insufficient documentation

## 2024-06-23 DIAGNOSIS — I10 Essential (primary) hypertension: Secondary | ICD-10-CM | POA: Insufficient documentation

## 2024-06-23 MED ORDER — FUROSEMIDE 20 MG PO TABS: 20.0000 mg | ORAL_TABLET | ORAL | 3 refills | Status: AC

## 2024-06-23 NOTE — Progress Notes (Signed)
 " Cardiology Office Note:  .    Date:  06/23/2024  ID:  Jake Elliott, DOB Apr 21, 1971, MRN [id number] PCP: Donzella Lauraine SAILOR, DO  [facility] Providers Cardiologist:  Evalene Lunger, MD     CC: CMI therapy discussion Consulted for the evaluation of symptomatic o-HCM at the behest of Dr. Lunger   History of Present Illness: Jake    Jake Elliott is a 54 y.o. male with hypertrophic obstructive cardiomyopathy who presents with symptoms of heart failure and obstructive lung disease.  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 A septal myectomy was performed in 2008, but he did not experience significant improvement post-recovery. Recent echocardiograms indicate septal hypertrophy and LVOT flow turbulence, with a septal thickness of 23 mm and a severe LVOT gradient of 104 mmHg with Valsalva maneuver. Symptoms include dizziness, lightheadedness, and palpitations. The patient reports being told that some of these are PVCs.  He has hypertension and a history of non-sustained ventricular tachycardia (NSVT), with two episodes recorded under 200 beats per minute. A CT scan in 2024 showed nonobstructive coronary artery disease. He is on verapamil , taking a full dose in the morning and a half dose at night. Metoprolol  was previously used but discontinued due to second-degree heart block.  Recently diagnosed with chronic obstructive pulmonary disease (COPD) and asthma, he believes these conditions may have been exacerbated by COVID-19. He experiences significant shortness of breath and attributes some symptoms to mixed obstructive lung disease. He also reports polycythemia and frequent dehydration despite adequate fluid intake.  He is a smoker and continues to smoke. He experiences significant swelling, particularly in his feet, which were previously so swollen that he could not wear shoes. He describes his  feet as 'black, red, and purple' and bleeding from cracks. Frequent headaches are attributed to spinal damage and high blood pressure.  He is not taking Lasix  regularly. He has a history of using Lasix  in the past but stopped due to worsening LVOT obstruction symptoms. Frequent dizziness and lightheadedness, particularly with positional changes, are reported by the patient.  Discussed the use of AI scribe software for clinical note transcription with the patient, who gave verbal consent to proceed.   Relevant histories: .  Social  - one daughter, two half siblings, father s/p ICD  - s/p Myectomy at [facility] in 2008; Followed by Dr. Cesario  ROS: As per HPI.   Studies Reviewed: .     Cardiac Studies & Procedures   ______________________________________________________________________________________________     ECHOCARDIOGRAM  ECHOCARDIOGRAM COMPLETE 03/30/2024  Narrative ECHOCARDIOGRAM REPORT    Patient Name:   Jake Elliott Date of Exam: 03/30/2024 Medical Rec #:  [id number]              Height:       71.0 in Accession #:    [id number]             Weight:       241.0 lb Date of Birth:  1970-09-01              BSA:          2.282 m Patient Age:    53 years               BP:           148/88 mmHg Patient Gender: M  HR:           94 bpm. Exam Location:  [place]  Procedure: 2D Echo, 3D Echo, Cardiac Doppler, Color Doppler and Strain Analysis (Both Spectral and Color Flow Doppler were utilized during procedure).  Indications:    I42.1 Hypertrophic Cardiomyopathy  History:        Patient has prior history of Echocardiogram examinations, most recent 12/19/2021. Hypertrophic Cardiomyopathy, Arrythmias:Tachycardia, Signs/Symptoms:Edema and Shortness of Breath; Risk Factors:Hypertension, Sleep Apnea and Current Smoker.  Sonographer:    Gordon Miyamoto Referring Phys: [id number] EVALENE JINNY LUNGER   Sonographer Comments: Image acquisition challenging due to  patient body habitus. LVOT GRADIENT 13 mmHg AT REST AND 104 mmHg WITH VALSALVA IMPRESSIONS   1. Left ventricular ejection fraction, by estimation, is 55 to 60%. Left ventricular ejection fraction by 3D volume is 59 %. The left ventricle has normal function. The left ventricle has no regional wall motion abnormalities. There is severe left ventricular hypertrophy. LVOT gradient 13 mm Hg at rest, up to 104 mm Hg with valsalva. Left ventricular diastolic parameters are consistent with Grade I diastolic dysfunction (impaired relaxation). The average left ventricular global longitudinal strain is -15.5 %. The global longitudinal strain is abnormal. 2. Right ventricular systolic function is normal. The right ventricular size is normal. 3. The mitral valve is normal in structure. No evidence of mitral valve regurgitation. No evidence of mitral stenosis. 4. The aortic valve is normal in structure. Aortic valve regurgitation is mild. No aortic stenosis is present. 5. The inferior vena cava is normal in size with greater than 50% respiratory variability, suggesting right atrial pressure of 3 mmHg.  FINDINGS Left Ventricle: Left ventricular ejection fraction, by estimation, is 55 to 60%. Left ventricular ejection fraction by 3D volume is 59 %. The left ventricle has normal function. The left ventricle has no regional wall motion abnormalities. The average left ventricular global longitudinal strain is -15.5 %. Strain was performed and the global longitudinal strain is abnormal. The left ventricular internal cavity size was normal in size. There is severe left ventricular hypertrophy. Left ventricular diastolic parameters are consistent with Grade I diastolic dysfunction (impaired relaxation).  Right Ventricle: The right ventricular size is normal. No increase in right ventricular wall thickness. Right ventricular systolic function is normal.  Left Atrium: Left atrial size was normal in size.  Right Atrium:  Right atrial size was normal in size.  Pericardium: There is no evidence of pericardial effusion.  Mitral Valve: The mitral valve is normal in structure. No evidence of mitral valve regurgitation. No evidence of mitral valve stenosis.  Tricuspid Valve: The tricuspid valve is normal in structure. Tricuspid valve regurgitation is not demonstrated. No evidence of tricuspid stenosis.  Aortic Valve: The aortic valve is normal in structure. Aortic valve regurgitation is mild. No aortic stenosis is present. Aortic valve mean gradient measures 9.0 mmHg. Aortic valve peak gradient measures 13.1 mmHg.  Pulmonic Valve: The pulmonic valve was normal in structure. Pulmonic valve regurgitation is not visualized. No evidence of pulmonic stenosis.  Aorta: The aortic root is normal in size and structure.  Venous: The inferior vena cava is normal in size with greater than 50% respiratory variability, suggesting right atrial pressure of 3 mmHg.  IAS/Shunts: No atrial level shunt detected by color flow Doppler.  Additional Comments: 3D was performed not requiring image post processing on an independent workstation and was normal.   LEFT VENTRICLE PLAX 2D LVIDd:         5.40 cm  Diastology LVIDs:         2.20 cm         LV e' medial:    5.22 cm/s LV PW:         1.30 cm         LV E/e' medial:  14.0 LV IVS:        1.40 cm         LV e' lateral:   10.00 cm/s LV E/e' lateral: 7.3  2D Longitudinal Strain 2D Strain GLS   -15.5 % Avg:  3D Volume EF LV 3D EF:    Left ventricul ar ejection fraction by 3D volume is 59 %.  3D Volume EF: 3D EF:        59 % LV EDV:       140 ml LV ESV:       57 ml LV SV:        83 ml  RIGHT VENTRICLE RV Basal diam:  3.40 cm RV Mid diam:    2.60 cm RV S prime:     14.40 cm/s TAPSE (M-mode): 2.3 cm  LEFT ATRIUM             Index        RIGHT ATRIUM           Index LA diam:        3.70 cm 1.62 cm/m   RA Area:     16.90 cm LA Vol (A2C):   62.6 ml 27.43  ml/m  RA Volume:   40.50 ml  17.75 ml/m LA Vol (A4C):   49.7 ml 21.78 ml/m LA Biplane Vol: 54.2 ml 23.75 ml/m AORTIC VALVE AV Vmax:           181.00 cm/s AV Vmean:          139.000 cm/s AV VTI:            0.375 m AV Peak Grad:      13.1 mmHg AV Mean Grad:      9.0 mmHg LVOT Vmax:         190.00 cm/s LVOT Vmean:        125.000 cm/s LVOT VTI:          0.353 m LVOT/AV VTI ratio: 0.94  AORTA Ao Sinus diam: 3.50 cm Ao Asc diam:   3.10 cm  MITRAL VALVE MV Area (PHT): 3.63 cm     SHUNTS MV Decel Time: 209 msec     Systemic VTI: 0.35 m MV E velocity: 73.20 cm/s MV A velocity: 117.00 cm/s MV E/A ratio:  0.63  Evalene Lunger MD Electronically signed by Evalene Lunger MD Signature Date/Time: 03/30/2024/5:13:19 PM    Final    MONITORS  LONG TERM MONITOR (3-14 DAYS) 01/03/2024  Narrative Event monitor Patch Wear Time:  7 days and 0 hours (2025-06-15T20:40:34-0400 to 2025-06-22T20:45:07-398)  Normal sinus rhythm Patient had a min HR of 27 bpm, max HR of 176 bpm, and avg HR of 75 bpm. Bundle Branch Block/IVCD was present.  2 Ventricular Tachycardia runs occurred, the run with the fastest interval lasting 5 beats with a max rate of 176 bpm, the longest lasting 5 beats with an avg rate of 129 bpm.  2 Supraventricular Tachycardia/atrial tachycardia runs occurred, the run with the fastest interval lasting 5 beats with a max rate of 130 bpm (avg 123 bpm); the run with the fastest interval was also the longest.  Second Degree AV Block-Mobitz I (Wenckebach) was present.  Isolated  SVEs were rare (<1.0%), SVE Couplets were rare (<1.0%), and SVE Triplets were rare (<1.0%).  Isolated VEs were rare (<1.0%), VE Couplets were rare (<1.0%), and no VE Triplets were present.  Ventricular Trigeminy was present.  Patient triggered events (20) associated with normal sinus rhythm, rare PVCs, rare short VT  Signed, Velinda Lunger, MD, Ph.D [facility]   CT SCANS  CT CORONARY MORPH W/CTA  COR W/SCORE 04/05/2023  Addendum 04/29/2023 12:57 PM ADDENDUM REPORT: 04/29/2023 12:55  EXAM: OVER-READ INTERPRETATION  CT CHEST  The following report is an over-read performed by radiologist Dr. Fonda Mom [facility] on 04/29/2023. This over-read does not include interpretation of cardiac or coronary anatomy or pathology. The coronary CTA interpretation by the cardiologist is attached.  COMPARISON:  None.  FINDINGS: Cardiovascular:  See findings discussed in the body of the report.  Mediastinum/Nodes: No suspicious adenopathy identified. Imaged mediastinal structures are unremarkable.  Lungs/Pleura: There is dependent basilar subsegmental atelectasis. No pneumonia or pulmonary edema. No pleural effusion or pneumothorax.  Upper Abdomen: No acute abnormality.  Musculoskeletal: No chest wall abnormality. No acute osseous findings. There are thoracic degenerative changes.  IMPRESSION: No acute extracardiac incidental findings identified.   Electronically Signed By: Fonda Field M.D. On: 04/29/2023 12:55  Narrative CLINICAL DATA:  Chest pain  EXAM: Cardiac/Coronary  CTA  TECHNIQUE: The patient was scanned on a [employer] scanner.  : A retrospective scan was triggered in the ascending thoracic aorta. Axial non-contrast 3 mm slices were carried out through the heart. The data set was analyzed on a dedicated work station and scored using the Agatson method. Gantry rotation speed was 330 msecs and collimation was .6 mm. 100mg  of metoprolol  and 0.8 mg of sl NTG was given. The 3D data set was reconstructed in 5% intervals of the 60-95 % of the R-R cycle. Diastolic phases were analyzed on a dedicated work station using MPR, MIP and VRT modes. The patient received 80 cc of contrast.  FINDINGS: Aorta:  Normal size.  No calcifications.  No dissection.  Aortic Valve:  Trileaflet.  No calcifications.  Coronary Arteries:  Normal  coronary origin.  Right dominance.  RCA is a dominant artery. There is minimal calcified plaque proximally causing minimal stenosis (<25%).  Left main gives rise to LAD and LCX arteries. LM has no disease.  LAD has calcified plaque proximally causing mild stenosis (25%).  LCX is a non-dominant artery.  There is no plaque.  Other findings:  Normal pulmonary vein drainage into the left atrium.  Normal left atrial appendage without a thrombus.  Normal size of the pulmonary artery.  IMPRESSION: 1. Coronary calcium  score of 124. This was 90th percentile for age and sex matched control.  2. Normal coronary origin with right dominance.  3. Mild proximal LAD stenosis (25%).  4. Minimal proximal RCA stenosis (<25%).  5. CAD-RADS 2. Mild non-obstructive CAD (25-49%). Consider non-atherosclerotic causes of chest pain. Consider preventive therapy and risk factor modification.  Electronically Signed: By: Redell Cave M.D. On: 04/05/2023 16:00     ______________________________________________________________________________________________        Physical Exam:    VS:  BP (!) 152/85 (BP Location: Left Arm)   Pulse 82   Ht 5' 11 (1.803 m)   Wt 241 lb (109.3 kg)   SpO2 96%   BMI 33.61 kg/m    Wt Readings from Last 3 Encounters:  06/23/24 241 lb (109.3 kg)  06/02/24 238 lb 3.2 oz (108 kg)  05/29/24 238  lb (108 kg)    Gen: mild distress, obese   Neck: No JVD Cardiac: No Rubs or Gallops, harsh systolic murmur, RRR +2 radial pulses Respiratory: Clear to auscultation bilaterally, normal effort, normal  respiratory rate GI: Soft, nontender, non-distended  MS: No pitting edema;  moves all extremities Integument: Skin feels warm Neuro:  At time of evaluation, alert and oriented to person/place/time/situation  Psych: Normal affect, patient feels ok   ASSESSMENT AND PLAN: .    Hypertrophic Cardiomyopathy - sigmoid septum Variant - peak gradient 104 mm Hg despite  prior myectomy ([facility], 2008)  - suspicion of Fabry's/Danon/Noonan's or other mimics of HCM: low - Gene variant: Pending - NYHA III  - Non HCM Contributors to disease/status  Hypertension LE swelling Blood pressure control is a priority. Current medications include verapamil . Discussed potential impact of cardiac myosin inhibitors on blood pressure. He is on verapamil , which is reasonable for obstructive hypertrophic cardiomyopathy. - Continue verapamil  therapy. - In the setting of abdominal swelling symptoms, would increase to Q 48 lasix  to start; this will worsening is LVOT obstruction  History of non-sustained ventricular tachycardia - Two episodes of NSVT under 200 bpm noted on heart monitor in 2025. Not considered high risk. Discussed potential need for pacemaker or defibrillator in the future, but currently not indicated. - Continue to monitor for any high-risk arrhythmia symptoms. - Will reassess need for pacemaker or defibrillator in the future. - SCD risk from old ata 6.16%; I would, when stable, wish to repeat a CMR in [place]; he has deferred primary prevention ICD in the past ([facility], today) but it amenable to tabling the evaluation until after symptomatic improvement  Nonobstructive coronary artery disease - Noted on CT scan in 2024. - LDL goal 70 discussed smoking cessation  Pulmonary artery dilation - 30 mm on CT; Moderate pulmonary artery dilation noted on CT scan. Discussed in context of overall cardiac health and management of hypertrophic cardiomyopathy.  Happy to assist with smoking cessation interventions.  Family history, Discussed family screening  - discussed echo screening for daughter - working with Dr. Fairy for virtual eval and potential [employer] screening; cost is a barrier to testing that we are working through    Medication symptom plan - Severe LVOT obstruction with a gradient of 104 mmHg. Status post septal myectomy in 2008 with persistent symptoms.  Septal thickness estimated at 23 mm. Decreased global longitudinal strain and apical septal strain. Discussed potential for gene therapy if MYBPC3 mutation is present. Consideration of cardiac myosin inhibitors (Mavacamten and Afacamten) to reduce LVOT gradient and improve symptoms. Risks include potential decrease in heart function and need for close monitoring.  - he cannot take dual AV nodal therapy in the setting of his LBBB and his 2nd HB previously on metoprolol  and verapamil ; has profound fatigue with metoprolol  - defers ASA and repeat myectomy for now - defers disopyramide due to SE burden; he notes he is very likely to have SE to medications int he past - we have discussed both mavacamten (Camzyos) and aficamten (MyQuorvo) at length.  I prefer mavacamten for him due to: AE of hypertension in Aficamten.  If he is unable to take mavacamten due to issues with the REMS timing; I am amenable to trial aficamten instead. - I have given him materials on these interventions; he will read up on this and then let me know how he would like to proceed. - once he does this, we will set up follow up based on his intervention  Time:   I have spent a total of 67 minutes with the patient reviewing notes, imaging, EKGs, labs, and examining the patient as well as establishing an assessment and plan that was discussed personally with the patient. Discussed disease state education, using shared decision making tools and cardiac modeling, and CMI therapies. Reviewed care and plan in collaboration with clinical genetics.    Stanly Leavens, MD FASE [facility] Cardiologist [facility]  [address] [place], [address] [place], [place] [address] [phone]  5:19 PM  "

## 2024-06-23 NOTE — Patient Instructions (Signed)
 Medication Instructions:  Your physician has recommended you make the following change in your medication:   TAKE: furosemide  20 mg by mouth every other day  *If you need a refill on your cardiac medications before your next appointment, please call your pharmacy*  Lab Work: NONE    Testing/Procedures: NONE   Follow-Up:To be determined   At [facility], you and your health needs are our priority.  As part of our continuing mission to provide you with exceptional heart care, our providers are all part of one team.  This team includes your primary Cardiologist (physician) and Advanced Practice Providers or APPs (Physician Assistants and Nurse Practitioners) who all work together to provide you with the care you need, when you need it.   Provider:   Stanly Leavens, MD   Other Instructions

## 2024-06-26 ENCOUNTER — Other Ambulatory Visit: Payer: Self-pay

## 2024-06-26 ENCOUNTER — Other Ambulatory Visit: Payer: Self-pay | Admitting: Cardiovascular Disease

## 2024-06-26 DIAGNOSIS — E291 Testicular hypofunction: Secondary | ICD-10-CM

## 2024-06-27 ENCOUNTER — Encounter: Payer: Self-pay | Admitting: Emergency Medicine

## 2024-06-27 ENCOUNTER — Ambulatory Visit: Payer: Self-pay | Admitting: Family Medicine

## 2024-06-27 ENCOUNTER — Other Ambulatory Visit

## 2024-06-28 ENCOUNTER — Inpatient Hospital Stay (HOSPITAL_BASED_OUTPATIENT_CLINIC_OR_DEPARTMENT_OTHER): Admitting: Oncology

## 2024-06-28 ENCOUNTER — Inpatient Hospital Stay

## 2024-06-28 ENCOUNTER — Other Ambulatory Visit

## 2024-06-28 ENCOUNTER — Inpatient Hospital Stay: Attending: Oncology

## 2024-06-28 ENCOUNTER — Encounter: Payer: Self-pay | Admitting: Oncology

## 2024-06-28 VITALS — BP 146/90 | HR 71 | Resp 18

## 2024-06-28 VITALS — BP 138/90 | HR 76 | Temp 98.5°F | Resp 20 | Ht 71.0 in | Wt 243.0 lb

## 2024-06-28 DIAGNOSIS — D751 Secondary polycythemia: Secondary | ICD-10-CM

## 2024-06-28 DIAGNOSIS — E291 Testicular hypofunction: Secondary | ICD-10-CM | POA: Insufficient documentation

## 2024-06-28 DIAGNOSIS — F17210 Nicotine dependence, cigarettes, uncomplicated: Secondary | ICD-10-CM | POA: Diagnosis not present

## 2024-06-28 DIAGNOSIS — Z79890 Hormone replacement therapy (postmenopausal): Secondary | ICD-10-CM | POA: Diagnosis not present

## 2024-06-28 DIAGNOSIS — I10 Essential (primary) hypertension: Secondary | ICD-10-CM | POA: Diagnosis not present

## 2024-06-28 LAB — CBC WITH DIFFERENTIAL/PLATELET
Abs Immature Granulocytes: 0.02 K/uL (ref 0.00–0.07)
Basophils Absolute: 0.1 K/uL (ref 0.0–0.1)
Basophils Relative: 1 %
Eosinophils Absolute: 0.2 K/uL (ref 0.0–0.5)
Eosinophils Relative: 2 %
HCT: 54 % — ABNORMAL HIGH (ref 39.0–52.0)
Hemoglobin: 18.4 g/dL — ABNORMAL HIGH (ref 13.0–17.0)
Immature Granulocytes: 0 %
Lymphocytes Relative: 20 %
Lymphs Abs: 1.6 K/uL (ref 0.7–4.0)
MCH: 30.1 pg (ref 26.0–34.0)
MCHC: 34.1 g/dL (ref 30.0–36.0)
MCV: 88.4 fL (ref 80.0–100.0)
Monocytes Absolute: 0.6 K/uL (ref 0.1–1.0)
Monocytes Relative: 7 %
Neutro Abs: 5.4 K/uL (ref 1.7–7.7)
Neutrophils Relative %: 70 %
Platelets: 182 K/uL (ref 150–400)
RBC: 6.11 MIL/uL — ABNORMAL HIGH (ref 4.22–5.81)
RDW: 12.4 % (ref 11.5–15.5)
WBC: 7.8 K/uL (ref 4.0–10.5)
nRBC: 0 % (ref 0.0–0.2)

## 2024-06-28 NOTE — Progress Notes (Signed)
 " [facility]  Telephone:([phone] [phone]  ID: Jake Elliott OB: June 05, 1971  MR#: [id number]  RDW#:[id number]  Patient Care Team: Donzella Lauraine SAILOR, DO as PCP - General (Family Medicine) Perla Evalene PARAS, MD as PCP - Cardiology (Cardiology) Perla Evalene PARAS, MD as Consulting Physician (Cardiology) Jacobo Evalene PARAS, MD as Consulting Physician (Oncology)  CHIEF COMPLAINT: Secondary polycythemia.  INTERVAL HISTORY: Patient returns to clinic today for repeat laboratory work, further evaluation, and consideration of additional phlebotomy.  He has now reinitiated testosterone .  He does not complain of any weakness or fatigue.  He denies any recent fevers or illnesses.  He has no neurologic complaints.  He has a good appetite and denies weight loss.  He denies any chest pain, shortness of breath, cough, or hemoptysis.  He denies any nausea, vomiting, constipation, or diarrhea.  He has no urinary complaints.  Patient offers no specific complaints today.  REVIEW OF SYSTEMS:   Review of Systems  Constitutional: Negative.  Negative for fever, malaise/fatigue and weight loss.  HENT:  Negative for congestion.   Respiratory: Negative.  Negative for cough, hemoptysis, shortness of breath and wheezing.   Cardiovascular: Negative.  Negative for chest pain and leg swelling.  Gastrointestinal:  Negative for abdominal pain.  Genitourinary: Negative.  Negative for dysuria.  Musculoskeletal: Negative.  Negative for back pain.  Skin: Negative.  Negative for rash.  Neurological: Negative.  Negative for dizziness, focal weakness, weakness and headaches.  Psychiatric/Behavioral: Negative.  The patient is not nervous/anxious.     As per HPI. Otherwise, a complete review of systems is negative.  PAST MEDICAL HISTORY: Past Medical History:  Diagnosis Date   Angina at rest    Anxiety    Arthritis    Asthma    Attention-deficit/hyperactivity disorder    Bipolar  disorder (HCC)    COPD (chronic obstructive pulmonary disease) (HCC)    Crohn disease (HCC) 04/03/2024   Depression    GERD (gastroesophageal reflux disease)    GI bleed    Heart murmur    History of tobacco abuse    Hypertension    Hypertrophic cardiomyopathy (HCC)    s/p myomectomy   Peripheral neuropathy    PTSD (post-traumatic stress disorder)     PAST SURGICAL HISTORY: Past Surgical History:  Procedure Laterality Date   CARDIAC CATHETERIZATION     11/14/2010   COLON SURGERY     CORONARY ARTERY BYPASS GRAFT     for myomectomy for hypertrophic cardiomyopathy    FAMILY HISTORY: Family History  Problem Relation Age of Onset   Cancer - Lung Mother    Skin cancer Mother    Alcohol abuse Mother    Drug abuse Mother    Stroke Father    Hypertension Father    Alcohol abuse Father    Drug abuse Father    Drug abuse Sister     ADVANCED DIRECTIVES (Y/N):  N  HEALTH MAINTENANCE: Social History   Tobacco Use   Smoking status: Every Day    Current packs/day: 2.00    Average packs/day: 2.0 packs/day for 35.5 years (71.0 ttl pk-yrs)    Types: Cigarettes    Start date: 12/31/1988   Smokeless tobacco: Never   Tobacco comments:    1 PPD (currently smoking)  Vaping Use   Vaping status: Never Used  Substance Use Topics   Alcohol use: No    Alcohol/week: 0.0 standard drinks of alcohol   Drug use: No     Colonoscopy:  PAP:  Bone density:  Lipid panel:  No Active Allergies   Current Outpatient Medications  Medication Sig Dispense Refill   Aspirin-Salicylamide-Caffeine (ARTHRITIS STRENGTH BC POWDER PO) Take 1,000 mg by mouth 3 (three) times daily.     cyclobenzaprine  (FLEXERIL ) 10 MG tablet TAKE 1 TABLET BY MOUTH THREE TIMES DAILY AS NEEDED FOR MUSCLE SPASM (THIS  CAN  MAKE  YOU  SLEEPY) 90 tablet 1   dextroamphetamine  (DEXTROSTAT ) 10 MG tablet Take 10 mg by mouth daily.     diphenhydrAMINE  (BENADRYL ) 25 MG tablet Take 3 tablets (75 mg total) by mouth at bedtime as  needed. 90 tablet 1   ezetimibe  (ZETIA ) 10 MG tablet Take 1 tablet (10 mg total) by mouth daily. 90 tablet 3   furosemide  (LASIX ) 20 MG tablet Take 1 tablet (20 mg total) by mouth every other day. 45 tablet 3   ibuprofen  (ADVIL ) 600 MG tablet Take 600 mg by mouth every 6 (six) hours as needed for moderate pain (pain score 4-6).     levothyroxine  (SYNTHROID ) 100 MCG tablet Take 1 tablet (100 mcg total) by mouth daily. 90 tablet 0   metoCLOPramide  (REGLAN ) 10 MG tablet Take 1 tablet (10 mg total) by mouth every 6 (six) hours as needed. 30 tablet 0   NEEDLE, DISP, 18 G 18G X 1 MISC Use as directed 50 each 0   Oxycodone  HCl 10 MG TABS Take by mouth.     pantoprazole  (PROTONIX ) 40 MG tablet Take 1 tablet (40 mg total) by mouth 2 (two) times daily. 180 tablet 1   rosuvastatin  (CRESTOR ) 10 MG tablet Take 1 tablet (10 mg total) by mouth daily. 90 tablet 3   Spacer/Aero-Holding Chambers (AEROCHAMBER MV) inhaler Use as instructed 1 each 0   SYMBICORT  160-4.5 MCG/ACT inhaler Inhale 2 puffs into the lungs in the morning and at bedtime. 11 g 11   SYRINGE-NEEDLE, DISP, 3 ML (LUER LOCK SAFETY SYRINGES) 21G X 1-1/2 3 ML MISC Use as directed for testosterone  administration 50 each 0   tadalafil  (CIALIS ) 20 MG tablet Take 1 tablet by mouth once daily 30 tablet 0   testosterone  cypionate (DEPOTESTOSTERONE CYPIONATE) 200 MG/ML injection Inject 1 mL (200 mg total) into the muscle every 14 (fourteen) days. 4 mL 1   Tiotropium Bromide (SPIRIVA  RESPIMAT) 2.5 MCG/ACT AERS Inhale 2 puffs into the lungs daily. 4 g 12   triamcinolone  ointment (KENALOG ) 0.5 % Apply 1 Application topically 2 (two) times daily. 30 g 3   VENTOLIN  HFA 108 (90 Base) MCG/ACT inhaler Inhale 1-2 puffs into the lungs every 6 (six) hours as needed for wheezing or shortness of breath. 18 g 11   verapamil  (CALAN ) 120 MG tablet Take 1 tablet by mouth twice daily 180 tablet 3   No current facility-administered medications for this visit.     OBJECTIVE: Vitals:   06/28/24 1007  BP: (!) 138/90  Pulse: 76  Resp: 20  Temp: 98.5 F (36.9 C)  SpO2: 97%     Body mass index is 33.89 kg/m.    ECOG FS:0 - Asymptomatic  General: Well-developed, well-nourished, no acute distress. Eyes: Pink conjunctiva, anicteric sclera. HEENT: Normocephalic, moist mucous membranes. Lungs: No audible wheezing or coughing. Heart: Regular rate and rhythm. Abdomen: Soft, nontender, no obvious distention. Musculoskeletal: No edema, cyanosis, or clubbing. Neuro: Alert, answering all questions appropriately. Cranial nerves grossly intact. Skin: No rashes or petechiae noted. Psych: Normal affect.  LAB RESULTS:  Lab Results  Component Value Date   NA  144 05/22/2024   K 4.7 05/22/2024   CL 105 05/22/2024   CO2 26 05/22/2024   GLUCOSE 93 05/22/2024   BUN 18 05/22/2024   CREATININE 1.04 05/22/2024   CALCIUM  9.2 05/22/2024   PROT 6.7 05/22/2024   ALBUMIN 4.6 05/22/2024   AST 25 05/22/2024   ALT 28 05/22/2024   ALKPHOS 73 05/22/2024   BILITOT 0.5 05/22/2024   GFRNONAA >60 05/22/2024   GFRAA >60 03/09/2020    Lab Results  Component Value Date   WBC 7.8 06/28/2024   NEUTROABS 5.4 06/28/2024   HGB 18.4 (H) 06/28/2024   HCT 54.0 (H) 06/28/2024   MCV 88.4 06/28/2024   PLT 182 06/28/2024     STUDIES: US  Soft Tissue Head/Neck (NON-THYROID ) Result Date: 06/25/2024 EXAM: US  Parotid Glands Nonvascular Soft Tissue Ultrasound 06/12/2024 04:50:56 PM TECHNIQUE: Real-time ultrasound scan of the Parotid Glands with image documentation. COMPARISON: Maxillofacial CT from 02/08/2014. CLINICAL HISTORY: bilateral parotid gland swelling (L>R) FINDINGS: SOFT TISSUES: Right parotid gland: There is a hypoechoic focus within the right parotid gland measuring 11 x 13 x 6 mm. This finding is most consistent with an intraparotid lymph node and correlates with a benign-appearing node seen on maxillofacial CT from 02/08/2014. Left parotid gland: No abnormality  identified within the left parotid gland. No free fluid or fluid collection. IMPRESSION: 1. Hypoechoic focus within the right parotid gland measuring 11 x 13 x 6 mm, most consistent with an intraparotid lymph node, corresponding to a benign-appearing node seen on maxillofacial CT from 02/08/2014. 2. No abnormality identified within the left parotid gland. Electronically signed by: Franky Stanford MD MD 06/25/2024 02:13 AM EST RP Workstation: [id number]     ASSESSMENT: Secondary polycythemia.  PLAN:    Secondary polycythemia: Likely secondary to testosterone  use.  Patient's hemoglobin is trended up to 18.4. Previously, he also had an elevated carbon monoxide level, but the remainder of his laboratory work including iron stores, erythropoietin  levels, and JAK2 mutation with reflex were either negative or within normal limits.  Goal hemoglobin will be below 17.0.  Proceed with 500 mL phlebotomy today.  Return to clinic in 1 month with repeat laboratory work, further evaluation, and continuation of treatment if needed. Low testosterone : Okay from a hematology standpoint to continue testosterone  supplementation.  Hypertension: Mild.  Continue monitoring and treatment per primary care. Enlarged cervical glands: Ultrasound results as above.  Patient was previously given a referral to ENT.   Patient expressed understanding and was in agreement with this plan. He also understands that He can call clinic at any time with any questions, concerns, or complaints.    Evalene JINNY Reusing, MD   06/28/2024 10:19 AM     "

## 2024-06-28 NOTE — Addendum Note (Signed)
 Addended byBETHA KLUVER, Vivion Romano L on: 06/28/2024 11:51 AM   Modules accepted: Orders

## 2024-06-28 NOTE — Progress Notes (Deleted)
No phlebotomy today.

## 2024-06-28 NOTE — Progress Notes (Signed)
 Jake Elliott presents today for phlebotomy per MD orders. Phlebotomy procedure started at 1130 and ended at 1143. 500 mls removed. Patient tolerated procedure well. IV needle removed intact.

## 2024-06-29 ENCOUNTER — Encounter: Payer: Self-pay | Admitting: Oncology

## 2024-06-29 ENCOUNTER — Ambulatory Visit: Payer: Self-pay | Admitting: Urology

## 2024-06-29 LAB — HEMOGLOBIN AND HEMATOCRIT, BLOOD
Hematocrit: 55.1 % — ABNORMAL HIGH (ref 37.5–51.0)
Hemoglobin: 18.3 g/dL — ABNORMAL HIGH (ref 13.0–17.7)

## 2024-06-29 LAB — TESTOSTERONE: Testosterone: 997 ng/dL — ABNORMAL HIGH (ref 264–916)

## 2024-07-03 ENCOUNTER — Telehealth: Payer: Self-pay | Admitting: *Deleted

## 2024-07-03 NOTE — Telephone Encounter (Signed)
 Referral faxed to [facility], fax confirmation received.

## 2024-07-07 ENCOUNTER — Telehealth: Payer: Self-pay | Admitting: *Deleted

## 2024-07-07 NOTE — Telephone Encounter (Signed)
 Call placed to  ENT to follow up on referral sent earlier this week, referral was received and patient has been scheduled.

## 2024-08-08 ENCOUNTER — Inpatient Hospital Stay

## 2024-08-08 ENCOUNTER — Inpatient Hospital Stay: Admitting: Oncology

## 2024-10-02 ENCOUNTER — Encounter
